# Patient Record
Sex: Female | Born: 1990 | Race: White | Hispanic: No | Marital: Single | Smoking: Current every day smoker
Health system: Southern US, Community
[De-identification: ages and names within clinical notes are randomized; demographics above are authoritative.]

## PROBLEM LIST (undated history)

## (undated) DIAGNOSIS — Z8489 Family history of other specified conditions: Secondary | ICD-10-CM

## (undated) DIAGNOSIS — K219 Gastro-esophageal reflux disease without esophagitis: Secondary | ICD-10-CM

## (undated) DIAGNOSIS — R519 Headache, unspecified: Secondary | ICD-10-CM

## (undated) DIAGNOSIS — J45909 Unspecified asthma, uncomplicated: Secondary | ICD-10-CM

## (undated) DIAGNOSIS — I499 Cardiac arrhythmia, unspecified: Secondary | ICD-10-CM

## (undated) DIAGNOSIS — Q231 Congenital insufficiency of aortic valve: Secondary | ICD-10-CM

## (undated) DIAGNOSIS — M1990 Unspecified osteoarthritis, unspecified site: Secondary | ICD-10-CM

## (undated) DIAGNOSIS — F32A Depression, unspecified: Secondary | ICD-10-CM

## (undated) DIAGNOSIS — G4700 Insomnia, unspecified: Secondary | ICD-10-CM

## (undated) DIAGNOSIS — F319 Bipolar disorder, unspecified: Secondary | ICD-10-CM

## (undated) DIAGNOSIS — D649 Anemia, unspecified: Secondary | ICD-10-CM

## (undated) DIAGNOSIS — F329 Major depressive disorder, single episode, unspecified: Secondary | ICD-10-CM

## (undated) DIAGNOSIS — R002 Palpitations: Secondary | ICD-10-CM

## (undated) DIAGNOSIS — G4730 Sleep apnea, unspecified: Secondary | ICD-10-CM

## (undated) DIAGNOSIS — M419 Scoliosis, unspecified: Secondary | ICD-10-CM

## (undated) DIAGNOSIS — Q2381 Bicuspid aortic valve: Secondary | ICD-10-CM

## (undated) DIAGNOSIS — M797 Fibromyalgia: Secondary | ICD-10-CM

## (undated) DIAGNOSIS — I10 Essential (primary) hypertension: Secondary | ICD-10-CM

## (undated) DIAGNOSIS — F419 Anxiety disorder, unspecified: Secondary | ICD-10-CM

## (undated) HISTORY — PX: BELOW KNEE LEG AMPUTATION: SUR23

## (undated) HISTORY — DX: Depression, unspecified: F32.A

## (undated) HISTORY — DX: Bicuspid aortic valve: Q23.81

## (undated) HISTORY — DX: Sleep apnea, unspecified: G47.30

## (undated) HISTORY — PX: TONSILLECTOMY: SUR1361

## (undated) HISTORY — PX: ANKLE SURGERY: SHX546

## (undated) HISTORY — DX: Anxiety disorder, unspecified: F41.9

## (undated) HISTORY — DX: Congenital insufficiency of aortic valve: Q23.1

## (undated) HISTORY — PX: FRACTURE SURGERY: SHX138

## (undated) HISTORY — DX: Major depressive disorder, single episode, unspecified: F32.9

---

## 2017-05-11 ENCOUNTER — Other Ambulatory Visit: Payer: Self-pay

## 2017-05-11 ENCOUNTER — Encounter (HOSPITAL_BASED_OUTPATIENT_CLINIC_OR_DEPARTMENT_OTHER): Payer: Self-pay | Admitting: Emergency Medicine

## 2017-05-11 ENCOUNTER — Emergency Department (HOSPITAL_BASED_OUTPATIENT_CLINIC_OR_DEPARTMENT_OTHER)
Admission: EM | Admit: 2017-05-11 | Discharge: 2017-05-11 | Disposition: A | Discharge status: Home / Self Care | Payer: No Typology Code available for payment source | Attending: Emergency Medicine | Admitting: Emergency Medicine

## 2017-05-11 ENCOUNTER — Emergency Department (HOSPITAL_BASED_OUTPATIENT_CLINIC_OR_DEPARTMENT_OTHER): Payer: No Typology Code available for payment source

## 2017-05-11 DIAGNOSIS — F17200 Nicotine dependence, unspecified, uncomplicated: Secondary | ICD-10-CM | POA: Insufficient documentation

## 2017-05-11 DIAGNOSIS — R002 Palpitations: Secondary | ICD-10-CM | POA: Diagnosis not present

## 2017-05-11 LAB — CBC
HCT: 38.4 % (ref 36.0–46.0)
Hemoglobin: 13.5 g/dL (ref 12.0–15.0)
MCH: 31.1 pg (ref 26.0–34.0)
MCHC: 35.2 g/dL (ref 30.0–36.0)
MCV: 88.5 fL (ref 78.0–100.0)
Platelets: 225 10*3/uL (ref 150–400)
RBC: 4.34 MIL/uL (ref 3.87–5.11)
RDW: 11.9 % (ref 11.5–15.5)
WBC: 9.5 10*3/uL (ref 4.0–10.5)

## 2017-05-11 LAB — BASIC METABOLIC PANEL
Anion gap: 8 (ref 5–15)
BUN: 10 mg/dL (ref 6–20)
CO2: 23 mmol/L (ref 22–32)
Calcium: 9.3 mg/dL (ref 8.9–10.3)
Chloride: 104 mmol/L (ref 101–111)
Creatinine, Ser: 0.59 mg/dL (ref 0.44–1.00)
GFR calc Af Amer: >60 mL/min (ref >60)
GFR calc non Af Amer: >60 mL/min (ref >60)
Glucose, Bld: 98 mg/dL (ref 65–99)
Potassium: 3.8 mmol/L (ref 3.5–5.1)
Sodium: 135 mmol/L (ref 135–145)

## 2017-05-11 LAB — TROPONIN I: Troponin I: <0.03 ng/mL (ref <0.03)

## 2017-05-11 NOTE — ED Notes (Signed)
SpCO spot checked.  Running 4-7%.  EDP aware, placed on 2l/m [place].
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
## 2017-05-11 NOTE — ED Provider Notes (Signed)
[facility] EMERGENCY DEPARTMENT Provider Note   CSN: [id number] Arrival date & time: 05/11/17  0160     History   Chief Complaint Chief Complaint  Patient presents with  . Palpitations    HPI Taylor [name] is a 27 y.o. female.  HPI Here for evaluation of palpitations and dizziness.  Patient was palpitations have been ongoing over the past several months, characterized as a sensation of beating in her chest that are fleeting and spontaneous.  She also reports this morning feeling suddenly lightheaded for about 10 seconds and that to spontaneously resolved.  No fevers, chills, overt chest pain or shortness of breath, leg swelling, vomiting, diarrhea, constipation, abdominal pain, urinary symptoms, rash.  Current every day cigarette smoker.  She also reports that she drinks a lot of red bull, last ingestion 2 days ago.  Also has several cups of coffee daily.  Denies history of hypertension, hyperlipidemia, diabetes, family history of heart disease. History reviewed. No pertinent past medical history.  There are no active problems to display for this patient.   Past Surgical History:  Procedure Laterality Date  . TONSILLECTOMY      OB History    No data available       Home Medications    Prior to Admission medications   Not on File    Family History No family history on file.  Social History Social History   Tobacco Use  . Smoking status: Current Every Day Smoker  . Smokeless tobacco: Never Used  Substance Use Topics  . Alcohol use: No    Frequency: Never  . Drug use: No     Allergies   Patient has no known allergies.   Review of Systems Review of Systems HPI  Physical Exam Updated Vital Signs BP (!) 142/87 (BP Location: Right Arm)   Pulse 87   Temp 98.8 F (37.1 C) (Oral)   Resp 18   Ht 5\' 4"  (1.626 m)   Wt 99.8 kg (220 lb)   SpO2 99%   BMI 37.76 kg/m   Physical Exam  Constitutional: She appears well-developed. No distress.    Awake, alert and nontoxic in appearance  HENT:  Head: Normocephalic and atraumatic.  Right Ear: External ear normal.  Left Ear: External ear normal.  Mouth/Throat: Oropharynx is clear and moist.  Eyes: Conjunctivae and EOM are normal. Pupils are equal, round, and reactive to light.  Neck: Normal range of motion. No JVD present.  Cardiovascular: Normal rate, regular rhythm and normal heart sounds.  Pulmonary/Chest: Effort normal and breath sounds normal. No stridor.  Abdominal: Soft. There is no tenderness.  Musculoskeletal: Normal range of motion.  Neurological:  Awake, alert, cooperative and aware of situation; motor strength 5/5 and equal bilaterally; sensation normal to light touch bilaterally; no facial asymmetry; tongue midline; major cranial nerves appear intact;  baseline gait without new ataxia.  Finger to nose normal.  No pronator drift.  Skin: No rash noted. She is not diaphoretic.  Psychiatric: She has a normal mood and affect. Her behavior is normal. Thought content normal.  Nursing note and vitals reviewed.    ED Treatments / Results  Labs (all labs ordered are listed, but only abnormal results are displayed) Labs Reviewed  BASIC METABOLIC PANEL  CBC  TROPONIN I  PREGNANCY, URINE    EKG  EKG Interpretation  Date/Time:  Tuesday May 11 2017 10:28:17 EST Ventricular Rate:  88 PR Interval:    QRS Duration: 93 QT Interval:  356 QTC  Calculation: 431 R Axis:   67 Text Interpretation:  Sinus rhythm Low voltage, precordial leads No significant change since last tracing Confirmed by Blanchie Dessert [phone]) on 05/11/2017 11:09:50 AM       Radiology Dg Chest 2 View  Result Date: 05/11/2017 CLINICAL DATA:  Dizzy spells and shortness of breath. EXAM: CHEST  2 VIEW COMPARISON:  None. FINDINGS: The cardiac silhouette, mediastinal and hilar contours are within normal limits. The lungs are clear. No pleural effusion. The bony thorax is intact. IMPRESSION: Normal chest  x-ray. Electronically Signed   By: Marijo Sanes M.D.   On: 05/11/2017 10:56    Procedures Procedures (including critical care time)  Medications Ordered in ED Medications - No data to display   Initial Impression / Assessment and Plan / ED Course  I have reviewed the triage vital signs and the nursing notes.  Pertinent labs & imaging results that were available during my care of the patient were reviewed by me and considered in my medical decision making (see chart for details).     Presents for evaluation of intermittent palpitations.  Exam is reassuring, as is EKG, chest x-ray, labs, troponin negative.  She overall appears very well, nontoxic, hemodynamically stable, afebrile.  Discussed cessation of smoking as this is overall detrimental to health.  Also consider cessation of red bull, energy drinks, coffee as this can exacerbate symptoms.  Follow-up with PCP for possible Holter monitoring.  Return precautions discussed   Final Clinical Impressions(s) / ED Diagnoses   Final diagnoses:  Palpitations    ED Discharge Orders    None       Comer Locket, PA-C 05/11/17 1236    Blanchie Dessert, MD 05/11/17 1558

## 2017-05-11 NOTE — ED Notes (Signed)
ED Provider at bedside. 

## 2017-05-11 NOTE — Discharge Instructions (Signed)
Your exam, EKG, labs and chest x-ray are all reassuring.  It is important for you to stop smoking as this can be detrimental to your health.  He may also want to consider stop drinking red bull, other energy drinks, coffee as this can also contribute to your symptoms.  Please follow-up with your doctor for reevaluation.  Return to ED for new or worsening symptoms

## 2017-05-20 ENCOUNTER — Other Ambulatory Visit: Payer: Self-pay

## 2017-05-20 ENCOUNTER — Emergency Department (HOSPITAL_BASED_OUTPATIENT_CLINIC_OR_DEPARTMENT_OTHER)
Admission: EM | Admit: 2017-05-20 | Discharge: 2017-05-20 | Disposition: A | Discharge status: Home / Self Care | Payer: No Typology Code available for payment source | Attending: Emergency Medicine | Admitting: Emergency Medicine

## 2017-05-20 ENCOUNTER — Encounter (HOSPITAL_BASED_OUTPATIENT_CLINIC_OR_DEPARTMENT_OTHER): Payer: Self-pay | Admitting: *Deleted

## 2017-05-20 DIAGNOSIS — K0889 Other specified disorders of teeth and supporting structures: Secondary | ICD-10-CM | POA: Insufficient documentation

## 2017-05-20 DIAGNOSIS — F17200 Nicotine dependence, unspecified, uncomplicated: Secondary | ICD-10-CM | POA: Diagnosis not present

## 2017-05-20 DIAGNOSIS — Z79899 Other long term (current) drug therapy: Secondary | ICD-10-CM | POA: Insufficient documentation

## 2017-05-20 MED ORDER — NAPROXEN 500 MG PO TABS: 500.0000 mg | ORAL_TABLET | Freq: Two times a day (BID) | ORAL | 0 refills | Status: DC

## 2017-05-20 NOTE — ED Provider Notes (Signed)
[place] [facility] EMERGENCY DEPARTMENT Provider Note   CSN: [id number] Arrival date & time: 05/20/17  1216     History   Chief Complaint Chief Complaint  Patient presents with  . Dental Problem    HPI Taylor [name] is a 27 y.o. female.  HPI   Taylor [name] is a 27 year old female with no significant past medical history who presents to the emergency department for evaluation of right lower tooth pain and swelling.  Patient states that her pain began 2 days ago and has gradually worsened.  She reports that she noticed some associated right lower jaw swelling.  Was seen at the urgent care and was prescribed Augmentin which she began taking yesterday eveniing.  She was concerned because she woke up this morning and felt as if the right facial swelling had spread to the right side of her neck.  Her pain is 8/10 in severity and "throbbing" in nature.  She has tried taking ibuprofen without significant relief.  She denies fevers, chills, nausea/vomiting, trismus, dysphagia.  Reports that she does not have a regular dentist  History reviewed. No pertinent past medical history.  There are no active problems to display for this patient.   Past Surgical History:  Procedure Laterality Date  . TONSILLECTOMY      OB History    No data available       Home Medications    Prior to Admission medications   Medication Sig Start Date End Date Taking? Authorizing Provider  amoxicillin-clavulanate (AUGMENTIN) 875-125 MG tablet Take 1 tablet by mouth 2 (two) times daily.   Yes [provider]    Family History No family history on file.  Social History Social History   Tobacco Use  . Smoking status: Current Every Day Smoker  . Smokeless tobacco: Never Used  Substance Use Topics  . Alcohol use: No    Frequency: Never  . Drug use: No     Allergies   Patient has no known allergies.   Review of Systems Review of Systems  Constitutional: Negative for chills and  fever.  HENT: Positive for dental problem and facial swelling. Negative for trouble swallowing.   Respiratory: Negative for shortness of breath.   Gastrointestinal: Negative for abdominal pain, nausea and vomiting.     Physical Exam Updated Vital Signs BP 115/76   Pulse 81   Temp 98.2 F (36.8 C) (Oral)   Resp 16   Ht 5\' 4"  (1.626 m)   Wt 99.8 kg (220 lb)   SpO2 97%   BMI 37.76 kg/m   Physical Exam  Constitutional: She is oriented to person, place, and time. She appears well-developed and well-nourished. No distress.  HENT:  Head: Normocephalic and atraumatic.  Mouth/Throat:    Dental cavities and poor oral dentition noted. Pain along tooth as depicted in image. No abscess noted. Midline uvula. No trismus. OP moist and clear. No oropharyngeal erythema or edema. She has right lower jaw swelling. Neck supple, with tenderness over the right submandibular lymph nodes. Airway patent   Eyes: Right eye exhibits no discharge. Left eye exhibits no discharge.  Neck: Normal range of motion. Neck supple.  Pulmonary/Chest: Effort normal. No respiratory distress.  Neurological: She is alert and oriented to person, place, and time. Coordination normal.  Skin: She is not diaphoretic.  Psychiatric: She has a normal mood and affect. Her behavior is normal.  Nursing note and vitals reviewed.    ED Treatments / Results  Labs (all labs ordered are  listed, but only abnormal results are displayed) Labs Reviewed - No data to display  EKG  EKG Interpretation None       Radiology No results found.  Procedures Procedures (including critical care time)  Medications Ordered in ED Medications - No data to display   Initial Impression / Assessment and Plan / ED Course  I have reviewed the triage vital signs and the nursing notes.  Pertinent labs & imaging results that were available during my care of the patient were reviewed by me and considered in my medical decision making (see  chart for details).    Patient with toothache and right lower jaw swelling. She does have some tenderness over right sided submandibular lymph nodes.  No gross abscess.  Exam unconcerning for Ludwig's angina.  Have counseled her to continue taking her prescribed Augmentin. Discussed NSAID use for pain. Urged patient to follow-up with dentist.  Discussed return precautions and patient agrees Discussed this patient with Dr. Leonette Monarch who also saw the patient and agrees with above plan.   Final Clinical Impressions(s) / ED Diagnoses   Final diagnoses:  Pain, dental    ED Discharge Orders        Ordered    naproxen (NAPROSYN) 500 MG tablet  2 times daily     05/20/17 1458       Glyn Ade, PA-C 05/20/17 1705    Fatima Blank, MD 05/20/17 [phone]

## 2017-05-20 NOTE — Discharge Instructions (Signed)
Please continue taking antibiotic as prescribed.   You can take naprosyn twice a day for pain. This is a strong antiinflammatory medicine, please do not take with ibuprofen.   It is very important you follow up with a dentist.   Return to the ER if you have worsening swelling in the neck that crosses the midline, if you get fever >100.21F, trouble breathing or you have any new or worsening symptoms.

## 2017-05-20 NOTE — ED Triage Notes (Signed)
Dental abscess. She was seen at [facility] yesterday and started on Augmentin. She woke with increased facial swelling and redness.

## 2018-05-11 NOTE — L&D Delivery Note (Signed)
Delivery Note At 5:03 PM a viable female was delivered via Vaginal, Spontaneous (Presentation: LOA).  APGAR: 8, 9; weight  pending.   Placenta status: spontaneous, intact.  Cord: 3 vessel   Anesthesia:  epidural Episiotomy: None Lacerations: 2nd degree Suture Repair: 3.0 vicryl Est. Blood Loss (mL): 300  Mom to postpartum.  Baby to Couplet care / Skin to Skin.  Wende Mott CNM 03/25/2019, 6:08 PM

## 2018-05-26 DIAGNOSIS — Z0389 Encounter for observation for other suspected diseases and conditions ruled out: Secondary | ICD-10-CM | POA: Diagnosis not present

## 2018-05-26 DIAGNOSIS — Z01419 Encounter for gynecological examination (general) (routine) without abnormal findings: Secondary | ICD-10-CM | POA: Diagnosis not present

## 2018-05-26 DIAGNOSIS — Z3009 Encounter for other general counseling and advice on contraception: Secondary | ICD-10-CM | POA: Diagnosis not present

## 2018-05-26 DIAGNOSIS — Z1388 Encounter for screening for disorder due to exposure to contaminants: Secondary | ICD-10-CM | POA: Diagnosis not present

## 2018-06-02 ENCOUNTER — Ambulatory Visit: Payer: Self-pay | Admitting: Family

## 2018-06-06 ENCOUNTER — Ambulatory Visit (INDEPENDENT_AMBULATORY_CARE_PROVIDER_SITE_OTHER): Payer: PRIVATE HEALTH INSURANCE | Admitting: Family

## 2018-06-06 ENCOUNTER — Encounter: Payer: Self-pay | Admitting: Family

## 2018-06-06 VITALS — BP 128/86 | HR 96 | Temp 97.3°F | Ht 64.0 in | Wt 256.0 lb

## 2018-06-06 DIAGNOSIS — F4310 Post-traumatic stress disorder, unspecified: Secondary | ICD-10-CM | POA: Diagnosis not present

## 2018-06-06 DIAGNOSIS — F17200 Nicotine dependence, unspecified, uncomplicated: Secondary | ICD-10-CM | POA: Diagnosis not present

## 2018-06-06 DIAGNOSIS — K047 Periapical abscess without sinus: Secondary | ICD-10-CM | POA: Insufficient documentation

## 2018-06-06 DIAGNOSIS — F321 Major depressive disorder, single episode, moderate: Secondary | ICD-10-CM | POA: Insufficient documentation

## 2018-06-06 DIAGNOSIS — Z720 Tobacco use: Secondary | ICD-10-CM | POA: Insufficient documentation

## 2018-06-06 DIAGNOSIS — K219 Gastro-esophageal reflux disease without esophagitis: Secondary | ICD-10-CM | POA: Diagnosis not present

## 2018-06-06 DIAGNOSIS — S02609S Fracture of mandible, unspecified, sequela: Secondary | ICD-10-CM

## 2018-06-06 MED ORDER — OMEPRAZOLE 20 MG PO CPDR: 20.0000 mg | DELAYED_RELEASE_CAPSULE | Freq: Every day | ORAL | 3 refills | Status: DC

## 2018-06-06 MED ORDER — CITALOPRAM HYDROBROMIDE 20 MG PO TABS: ORAL_TABLET | ORAL | 5 refills | Status: DC

## 2018-06-06 MED ORDER — CLINDAMYCIN HCL 300 MG PO CAPS: 300.0000 mg | ORAL_CAPSULE | Freq: Three times a day (TID) | ORAL | 0 refills | Status: DC
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
## 2018-06-06 NOTE — Patient Instructions (Signed)

## 2018-06-07 LAB — CMP14+EGFR
ALT: 32 IU/L (ref 0–32)
AST: 18 IU/L (ref 0–40)
Albumin/Globulin Ratio: 2.1 (ref 1.2–2.2)
Albumin: 5.1 g/dL — ABNORMAL HIGH (ref 3.9–5.0)
Alkaline Phosphatase: 83 IU/L (ref 39–117)
BUN/Creatinine Ratio: 19 (ref 9–23)
BUN: 11 mg/dL (ref 6–20)
Bilirubin Total: 0.2 mg/dL (ref 0.0–1.2)
CO2: 20 mmol/L (ref 20–29)
Calcium: 9.9 mg/dL (ref 8.7–10.2)
Chloride: 100 mmol/L (ref 96–106)
Creatinine, Ser: 0.59 mg/dL (ref 0.57–1.00)
GFR calc Af Amer: 143 mL/min/{1.73_m2} (ref >59)
GFR calc non Af Amer: 124 mL/min/{1.73_m2} (ref >59)
Globulin, Total: 2.4 g/dL (ref 1.5–4.5)
Glucose: 86 mg/dL (ref 65–99)
Potassium: 4.1 mmol/L (ref 3.5–5.2)
Sodium: 138 mmol/L (ref 134–144)
Total Protein: 7.5 g/dL (ref 6.0–8.5)

## 2018-06-07 LAB — CBC WITH DIFFERENTIAL/PLATELET
Basophils Absolute: 0 10*3/uL (ref 0.0–0.2)
Basos: 0 %
EOS (ABSOLUTE): 0.2 10*3/uL (ref 0.0–0.4)
Eos: 2 %
Hematocrit: 40.4 % (ref 34.0–46.6)
Hemoglobin: 13.7 g/dL (ref 11.1–15.9)
Immature Grans (Abs): 0 10*3/uL (ref 0.0–0.1)
Immature Granulocytes: 0 %
Lymphocytes Absolute: 2.4 10*3/uL (ref 0.7–3.1)
Lymphs: 23 %
MCH: 30.4 pg (ref 26.6–33.0)
MCHC: 33.9 g/dL (ref 31.5–35.7)
MCV: 90 fL (ref 79–97)
Monocytes Absolute: 0.5 10*3/uL (ref 0.1–0.9)
Monocytes: 5 %
Neutrophils Absolute: 7.5 10*3/uL — ABNORMAL HIGH (ref 1.4–7.0)
Neutrophils: 70 %
Platelets: 242 10*3/uL (ref 150–450)
RBC: 4.5 x10E6/uL (ref 3.77–5.28)
RDW: 12.1 % (ref 11.7–15.4)
WBC: 10.6 10*3/uL (ref 3.4–10.8)

## 2018-06-07 LAB — TSH: TSH: 1.1 u[IU]/mL (ref 0.450–4.500)

## 2018-06-09 ENCOUNTER — Telehealth: Payer: Self-pay

## 2018-06-09 NOTE — Telephone Encounter (Signed)
lmtcb

## 2018-06-30 ENCOUNTER — Ambulatory Visit (INDEPENDENT_AMBULATORY_CARE_PROVIDER_SITE_OTHER): Payer: PRIVATE HEALTH INSURANCE | Admitting: Family

## 2018-06-30 ENCOUNTER — Encounter: Payer: Self-pay | Admitting: Family

## 2018-06-30 VITALS — BP 114/79 | HR 79 | Temp 98.4°F | Ht 64.0 in | Wt 254.6 lb

## 2018-06-30 DIAGNOSIS — F411 Generalized anxiety disorder: Secondary | ICD-10-CM

## 2018-06-30 DIAGNOSIS — J029 Acute pharyngitis, unspecified: Secondary | ICD-10-CM | POA: Diagnosis not present

## 2018-06-30 DIAGNOSIS — F17200 Nicotine dependence, unspecified, uncomplicated: Secondary | ICD-10-CM | POA: Diagnosis not present

## 2018-06-30 DIAGNOSIS — F410 Panic disorder [episodic paroxysmal anxiety] without agoraphobia: Secondary | ICD-10-CM | POA: Insufficient documentation

## 2018-06-30 DIAGNOSIS — J020 Streptococcal pharyngitis: Secondary | ICD-10-CM

## 2018-06-30 DIAGNOSIS — F321 Major depressive disorder, single episode, moderate: Secondary | ICD-10-CM | POA: Diagnosis not present

## 2018-06-30 LAB — RAPID STREP SCREEN (MED CTR MEBANE ONLY): Strep Gp A Ag, IA W/Reflex: POSITIVE — AB

## 2018-06-30 MED ORDER — CITALOPRAM HYDROBROMIDE 40 MG PO TABS: 40.0000 mg | ORAL_TABLET | Freq: Every day | ORAL | 5 refills | Status: DC

## 2018-06-30 MED ORDER — AMOXICILLIN 875 MG PO TABS: 875.0000 mg | ORAL_TABLET | Freq: Two times a day (BID) | ORAL | 0 refills | Status: DC

## 2018-06-30 NOTE — Patient Instructions (Signed)
Strep Throat    Strep throat is a bacterial infection of the throat. Your health care provider may call the infection tonsillitis or pharyngitis, depending on whether there is swelling in the tonsils or at the back of the throat. Strep throat is most common during the cold months of the year in children who are 5-30 years of age, but it can happen during any season in people of any age. This infection is spread from person to person (contagious) through coughing, sneezing, or close contact.  What are the causes?  Strep throat is caused by the bacteria called Streptococcus pyogenes.  What increases the risk?  This condition is more likely to develop in:  · People who spend time in crowded places where the infection can spread easily.  · People who have close contact with someone who has strep throat.  What are the signs or symptoms?  Symptoms of this condition include:  · Fever or chills.  · Redness, swelling, or pain in the tonsils or throat.  · Pain or difficulty when swallowing.  · White or yellow spots on the tonsils or throat.  · Swollen, tender glands in the neck or under the jaw.  · Red rash all over the body (rare).  How is this diagnosed?  This condition is diagnosed by performing a rapid strep test or by taking a swab of your throat (throat culture test). Results from a rapid strep test are usually ready in a few minutes, but throat culture test results are available after one or two days.  How is this treated?  This condition is treated with antibiotic medicine.  Follow these instructions at home:  Medicines  · Take over-the-counter and prescription medicines only as told by your health care provider.  · Take your antibiotic as told by your health care provider. Do not stop taking the antibiotic even if you start to feel better.  · Have family members who also have a sore throat or fever tested for strep throat. They may need antibiotics if they have the strep infection.  Eating and drinking  · Do not  share food, drinking cups, or personal items that could cause the infection to spread to other people.  · If swallowing is difficult, try eating soft foods until your sore throat feels better.  · Drink enough fluid to keep your urine clear or pale yellow.  General instructions  · Gargle with a salt-water mixture 3-4 times per day or as needed. To make a salt-water mixture, completely dissolve ½-1 tsp of salt in 1 cup of warm water.  · Make sure that all household members wash their hands well.  · Get plenty of rest.  · Stay home from school or work until you have been taking antibiotics for 24 hours.  · Keep all follow-up visits as told by your health care provider. This is important.  Contact a health care provider if:  · The glands in your neck continue to get bigger.  · You develop a rash, cough, or earache.  · You cough up a thick liquid that is green, yellow-brown, or bloody.  · You have pain or discomfort that does not get better with medicine.  · Your problems seem to be getting worse rather than better.  · You have a fever.  Get help right away if:  · You have new symptoms, such as vomiting, severe headache, stiff or painful neck, chest pain, or shortness of breath.  · You have severe throat   pain, drooling, or changes in your voice.  · You have swelling of the neck, or the skin on the neck becomes red and tender.  · You have signs of dehydration, such as fatigue, dry mouth, and decreased urination.  · You become increasingly sleepy, or you cannot wake up completely.  · Your joints become red or painful.  This information is not intended to replace advice given to you by your health care provider. Make sure you discuss any questions you have with your health care provider.  Document Released: 04/24/2000 Document Revised: 12/25/2015 Document Reviewed: 08/20/2014  Elsevier Interactive Patient Education © 2019 [employer]

## 2018-06-30 NOTE — Progress Notes (Signed)
Subjective:    Patient ID: Taylor [name], female    DOB: [birth year], 28 y.o.   MRN: [id number]  Chief Complaint  Patient presents with  . Sore Throat  . Headache  . Generalized Body Aches   PT presents to the office today with sore throat and recheck GAD and Depression. She states she was started on Celexa 20 mg and after 2 weeks she increased to 40 mg. She states her depression has greatly improved. Sore Throat   This is a new problem. The current episode started yesterday. The problem has been gradually worsening. There has been no fever. The pain is at a severity of 8/10. Associated symptoms include congestion, coughing, headaches, a plugged ear sensation, swollen glands and trouble swallowing. Pertinent negatives include no ear discharge. She has tried acetaminophen for the symptoms. The treatment provided mild relief.  Headache   Associated symptoms include coughing and swollen glands.  Depression         This is a chronic problem.  The current episode started more than 1 year ago.   The onset quality is gradual.   The problem occurs intermittently.  The problem has been waxing and waning since onset.  Associated symptoms include irritable, decreased interest, headaches and sad.  Associated symptoms include no helplessness and no hopelessness.  Past treatments include SSRIs - Selective serotonin reuptake inhibitors.  Compliance with treatment is good.  Past medical history includes anxiety.   Anxiety  Presents for follow-up visit. Symptoms include depressed mood, excessive worry, malaise and nervous/anxious behavior. Symptoms occur occasionally. The severity of symptoms is moderate.        Review of Systems  HENT: Positive for congestion and trouble swallowing. Negative for ear discharge.   Respiratory: Positive for cough.   Neurological: Positive for headaches.  Psychiatric/Behavioral: Positive for depression. The patient is nervous/anxious.   All other systems reviewed and  are negative.      Objective:   Physical Exam Vitals signs reviewed.  Constitutional:      General: She is irritable. She is not in acute distress.    Appearance: She is well-developed. She is obese.  HENT:     Head: Normocephalic and atraumatic.     Right Ear: External ear normal.     Nose: No rhinorrhea.     Mouth/Throat:     Pharynx: Posterior oropharyngeal erythema present.  Eyes:     Pupils: Pupils are equal, round, and reactive to light.  Neck:     Musculoskeletal: Normal range of motion and neck supple.     Thyroid: No thyromegaly.  Cardiovascular:     Rate and Rhythm: Normal rate and regular rhythm.     Heart sounds: Normal heart sounds. No murmur.  Pulmonary:     Effort: Pulmonary effort is normal. No respiratory distress.     Breath sounds: Normal breath sounds. No wheezing.  Abdominal:     General: Bowel sounds are normal. There is no distension.     Palpations: Abdomen is soft.     Tenderness: There is no abdominal tenderness.  Musculoskeletal: Normal range of motion.        General: No tenderness.  Skin:    General: Skin is warm and dry.  Neurological:     Mental Status: She is alert and oriented to person, place, and time.     Cranial Nerves: No cranial nerve deficit.     Deep Tendon Reflexes: Reflexes are normal and symmetric.  Psychiatric:  Behavior: Behavior normal.        Thought Content: Thought content normal.        Judgment: Judgment normal.       BP 114/79   Pulse 79   Temp 98.4 F (36.9 C) (Oral)   Ht 5\' 4"  (1.626 m)   Wt 254 lb 9.6 oz (115.5 kg)   BMI 43.70 kg/m      Assessment & Plan:  Taylor [name] comes in today with chief complaint of Sore Throat; Headache; and Generalized Body Aches   Diagnosis and orders addressed:  1. Sore throat - Rapid Strep Screen ([facility] ONLY)  2. Depression, major, single episode, moderate (HCC) Continue Celexa 40 mg  Stress management  Keep Behavioral Health appt  -  citalopram (CELEXA) 40 MG tablet; Take 1 tablet (40 mg total) by mouth daily.  Dispense: 30 tablet; Refill: 5  3. Current smoker Smoking cessation discussed  4. GAD (generalized anxiety disorder) Stress management  Continue Celexa 40 mg - citalopram (CELEXA) 40 MG tablet; Take 1 tablet (40 mg total) by mouth daily.  Dispense: 30 tablet; Refill: 5  5. Strep throat - Take meds as prescribed - Use a cool mist humidifier  -Use saline nose sprays frequently -Force fluids -For any cough or congestion  Use plain Mucinex- regular strength or max strength is fine -For fever or aces or pains- take tylenol or ibuprofen. -Throat lozenges if help -New toothbrush in 3 days - amoxicillin (AMOXIL) 875 MG tablet; Take 1 tablet (875 mg total) by mouth 2 (two) times daily.  Dispense: 14 tablet; Refill: 0   Evelina Dun, FNP

## 2018-07-01 ENCOUNTER — Telehealth: Payer: Self-pay | Admitting: Family

## 2018-07-01 NOTE — Telephone Encounter (Signed)
Work note ready for pick up.  Patient aware

## 2018-07-01 NOTE — Telephone Encounter (Signed)
This is fine. We discussed if she needed we could extend note.

## 2018-07-11 ENCOUNTER — Telehealth: Payer: Self-pay | Admitting: Family

## 2018-07-11 NOTE — Telephone Encounter (Signed)
LM to call for appt

## 2018-07-12 ENCOUNTER — Ambulatory Visit (INDEPENDENT_AMBULATORY_CARE_PROVIDER_SITE_OTHER): Payer: PRIVATE HEALTH INSURANCE | Admitting: Family Medicine

## 2018-07-12 ENCOUNTER — Encounter: Payer: Self-pay | Admitting: Family Medicine

## 2018-07-12 VITALS — BP 130/75 | HR 97 | Temp 97.1°F | Ht 64.0 in | Wt 257.1 lb

## 2018-07-12 DIAGNOSIS — Z3201 Encounter for pregnancy test, result positive: Secondary | ICD-10-CM | POA: Diagnosis not present

## 2018-07-12 NOTE — Progress Notes (Signed)
Chief Complaint  Patient presents with  . Possible Pregnancy    pt here today for pregnancy test after having positive at home preg test to verify due to working with chemicals    HPI  Patient presents today for possible pregnancy.  She had her IUD removed about 6 weeks ago.  Her pregnancy test was positive at home.  She is concerned about the possibility of the effects on the baby from her being exposed to chemicals at work.  PMH: Smoking status noted ROS: Per HPI  Objective: BP 130/75   Pulse 97   Temp (!) 97.1 F (36.2 C) (Oral)   Ht 5\' 4"  (1.626 m)   Wt 257 lb 2 oz (116.6 kg)   BMI 44.14 kg/m  Gen: NAD, alert, cooperative with exam HEENT: NCAT, EOMI, PERRL Pregnancy confirmed neuro: Alert and oriented, No gross deficits  Assessment and plan:  1. Pregnancy test performed, pregnancy confirmed     [facility].  I encouraged her to keep that appointment.  She was given the lab sheet showing positive pregnancy.  I encouraged her to discuss exposures to chemicals and medications with her obstetrician.  Until that time she should avoid exposures and not take her Celexa.  Orders Placed This Encounter  Procedures  . Pregnancy, urine    Follow up as needed.  Claretta Fraise, MD

## 2018-07-13 ENCOUNTER — Telehealth: Payer: Self-pay | Admitting: Obstetrics & Gynecology

## 2018-07-13 LAB — PREGNANCY, URINE: Preg Test, Ur: POSITIVE — AB

## 2018-07-13 NOTE — Telephone Encounter (Signed)
I have scheduled her for her dating [place].  She wants to know if it's safe to continue taking Celexa and Prilosec.  She also wants to know if she can get an OTC prenatal vitamin.  [facility] [place]  [phone]

## 2018-07-13 NOTE — Progress Notes (Deleted)
Psychiatric Initial Adult Assessment   Patient Identification: Taylor [name] MRN:  [id number] Date of Evaluation:  07/13/2018 Referral Source: Sharion Balloon, FNP Chief Complaint:   Visit Diagnosis: No diagnosis found.  History of Present Illness:   Taylor [name] is a 28 y.o. year old female with a history of depression, PTSD, r/o bipolar disorder, possible pregnancy, who is referred for depression.      Associated Signs/Symptoms: Depression Symptoms:  {DEPRESSION SYMPTOMS:20000} (Hypo) Manic Symptoms:  {BHH MANIC SYMPTOMS:22872} Anxiety Symptoms:  {BHH ANXIETY SYMPTOMS:22873} Psychotic Symptoms:  {BHH PSYCHOTIC SYMPTOMS:22874} PTSD Symptoms: {BHH PTSD SYMPTOMS:22875}  Past Psychiatric History:  Outpatient:  Psychiatry admission:  Previous suicide attempt:  Past trials of medication:  History of violence:   Previous Psychotropic Medications: {YES/NO:21197}  Substance Abuse History in the last 12 months:  {yes no:314532}  Consequences of Substance Abuse: {BHH CONSEQUENCES OF SUBSTANCE ABUSE:22880}  Past Medical History: No past medical history on file.  Past Surgical History:  Procedure Laterality Date  . ANKLE SURGERY     At age 50.  . TONSILLECTOMY      Family Psychiatric History: ***  Family History:  Family History  Problem Relation Age of Onset  . Cancer Mother        Mouth  . Hypertension Mother   . COPD Mother   . Hypertension Sister   . Diabetes Maternal Grandmother   . Diabetes Paternal Grandmother     Social History:   Social History   Socioeconomic History  . Marital status: Single    Spouse name: Not on file  . Number of children: Not on file  . Years of education: Not on file  . Highest education level: Not on file  Occupational History  . Not on file  Social Needs  . Financial resource strain: Not on file  . Food insecurity:    Worry: Not on file    Inability: Not on file  . Transportation needs:    Medical: Not on file     Non-medical: Not on file  Tobacco Use  . Smoking status: Light Tobacco Smoker  . Smokeless tobacco: Never Used  . Tobacco comment: One pack for a week and half.  Substance and Sexual Activity  . Alcohol use: No    Frequency: Never  . Drug use: No  . Sexual activity: Not on file  Lifestyle  . Physical activity:    Days per week: Not on file    Minutes per session: Not on file  . Stress: Not on file  Relationships  . Social connections:    Talks on phone: Not on file    Gets together: Not on file    Attends religious service: Not on file    Active member of club or organization: Not on file    Attends meetings of clubs or organizations: Not on file    Relationship status: Not on file  Other Topics Concern  . Not on file  Social History Narrative  . Not on file    Additional Social History: ***  Allergies:  No Known Allergies  Metabolic Disorder Labs: No results found for: HGBA1C, MPG No results found for: PROLACTIN No results found for: CHOL, TRIG, HDL, CHOLHDL, VLDL, LDLCALC Lab Results  Component Value Date   TSH 1.100 06/06/2018    Therapeutic Level Labs: No results found for: LITHIUM No results found for: CBMZ No results found for: VALPROATE  Current Medications: Current Outpatient Medications  Medication Sig Dispense Refill  . citalopram (CELEXA)  40 MG tablet Take 1 tablet (40 mg total) by mouth daily. (Patient not taking: Reported on 07/12/2018) 30 tablet 5  . omeprazole (PRILOSEC) 20 MG capsule Take 1 capsule (20 mg total) by mouth daily. 90 capsule 3   No current facility-administered medications for this visit.     Musculoskeletal: Strength & Muscle Tone: within normal limits Gait & Station: normal Patient leans: N/A  Psychiatric Specialty Exam: ROS  There were no vitals taken for this visit.There is no height or weight on file to calculate BMI.  General Appearance: Fairly Groomed  Eye Contact:  Good  Speech:  Clear and Coherent  Volume:   Normal  Mood:  {BHH MOOD:22306}  Affect:  {Affect (PAA):22687}  Thought Process:  Coherent  Orientation:  Full (Time, Place, and Person)  Thought Content:  Logical  Suicidal Thoughts:  {ST/HT (PAA):22692}  Homicidal Thoughts:  {ST/HT (PAA):22692}  Memory:  Immediate;   Good  Judgement:  {Judgement (PAA):22694}  Insight:  {Insight (PAA):22695}  Psychomotor Activity:  Normal  Concentration:  Concentration: Good and Attention Span: Good  Recall:  Good  Fund of Knowledge:Good  Language: Good  Akathisia:  No  Handed:  Right  AIMS (if indicated):  not done  Assets:  Communication Skills Desire for Improvement  ADL's:  Intact  Cognition: WNL  Sleep:  {BHH GOOD/FAIR/POOR:22877}   Screenings: PHQ2-9     Office Visit from 07/12/2018 in [place] Visit from 06/30/2018 in [place] Visit from 06/06/2018 in [place]  PHQ-2 Total Score  0  0  0      Assessment and Plan:  Assessment  Plan  The patient demonstrates the following risk factors for suicide: Chronic risk factors for suicide include: {Chronic Risk Factors for HALPFXT:02409735}. Acute risk factors for suicide include: {Acute Risk Factors for HGDJMEQ:68341962}. Protective factors for this patient include: {Protective Factors for Suicide IWLN:98921194}. Considering these factors, the overall suicide risk at this point appears to be {Desc; low/moderate/high:110033}. Patient {ACTION; IS/IS RDE:08144818} appropriate for outpatient follow up.   Norman Clay, MD 3/4/20204:04 PM

## 2018-07-13 NOTE — Telephone Encounter (Signed)
Patient called, she is scheduled for new GYN on 07/22/18 w/Jennifer w/a pregnancy test.  She got confirmation yesterday, is bringing it by today.  Please advise what I need to schedule.  [phone]

## 2018-07-13 NOTE — Telephone Encounter (Signed)
Patient informed she can take Celexa and Prilosec during pregnancy.  Advised to get PNV over the counter.  Verbalized understanding.

## 2018-07-19 ENCOUNTER — Ambulatory Visit: Payer: PRIVATE HEALTH INSURANCE | Admitting: Family

## 2018-07-20 ENCOUNTER — Ambulatory Visit (HOSPITAL_COMMUNITY): Payer: Self-pay | Admitting: Psychiatry

## 2018-07-22 ENCOUNTER — Encounter: Payer: Self-pay | Admitting: Adult Health

## 2018-07-26 ENCOUNTER — Other Ambulatory Visit: Payer: Self-pay | Admitting: Obstetrics & Gynecology

## 2018-07-26 DIAGNOSIS — O3680X0 Pregnancy with inconclusive fetal viability, not applicable or unspecified: Secondary | ICD-10-CM

## 2018-07-27 ENCOUNTER — Other Ambulatory Visit: Payer: Self-pay

## 2018-07-27 ENCOUNTER — Ambulatory Visit (INDEPENDENT_AMBULATORY_CARE_PROVIDER_SITE_OTHER): Payer: PRIVATE HEALTH INSURANCE

## 2018-07-27 DIAGNOSIS — Z3A01 Less than 8 weeks gestation of pregnancy: Secondary | ICD-10-CM | POA: Diagnosis not present

## 2018-07-27 DIAGNOSIS — O3680X0 Pregnancy with inconclusive fetal viability, not applicable or unspecified: Secondary | ICD-10-CM | POA: Diagnosis not present

## 2018-07-27 NOTE — Progress Notes (Signed)
[place] 6+3 wks single IUP w/ys,positive fht 118 bpm,normal ovaries bilat,crl 6.01 mm

## 2018-07-29 ENCOUNTER — Encounter: Payer: Self-pay | Admitting: Family

## 2018-07-29 ENCOUNTER — Other Ambulatory Visit: Payer: Self-pay

## 2018-07-29 ENCOUNTER — Ambulatory Visit (INDEPENDENT_AMBULATORY_CARE_PROVIDER_SITE_OTHER): Payer: PRIVATE HEALTH INSURANCE | Admitting: Family

## 2018-07-29 VITALS — BP 102/64 | HR 79 | Temp 97.1°F | Ht 64.0 in | Wt 260.0 lb

## 2018-07-29 DIAGNOSIS — G4733 Obstructive sleep apnea (adult) (pediatric): Secondary | ICD-10-CM

## 2018-07-29 DIAGNOSIS — Z3A01 Less than 8 weeks gestation of pregnancy: Secondary | ICD-10-CM | POA: Diagnosis not present

## 2018-07-29 DIAGNOSIS — F17200 Nicotine dependence, unspecified, uncomplicated: Secondary | ICD-10-CM | POA: Diagnosis not present

## 2018-07-29 DIAGNOSIS — F411 Generalized anxiety disorder: Secondary | ICD-10-CM

## 2018-07-29 DIAGNOSIS — F321 Major depressive disorder, single episode, moderate: Secondary | ICD-10-CM

## 2018-07-29 NOTE — Patient Instructions (Signed)

## 2018-07-29 NOTE — Progress Notes (Signed)
Subjective:    Taylor Taylor [name] ID: Taylor Taylor [name], female    DOB: [birth year], 28 y.o.   MRN: [id number]  Chief Complaint  Taylor Taylor [name] presents with  . depression/anxiety   Taylor Taylor [name] presents to the office today to recheck GAD and Depression. Taylor Taylor [name] was started on Celexa. Taylor Taylor [name] has since found out that Taylor Taylor [name] is pregnant. Taylor Taylor [name] is currently 6 weeks and 5 days.   Taylor Taylor [name] states Taylor Taylor [name] sleep apnea is worse. Taylor Taylor [name] states Taylor Taylor [name] was diagnosed with this several years ago, but no longer has a machine.  Depression         This is a chronic problem.  The onset quality is gradual.   The problem occurs intermittently.  The problem has been waxing and waning since onset.  Associated symptoms include Taylor [name], restlessness and decreased interest.  Associated symptoms include no hopelessness.  Past treatments include SSRIs - Selective serotonin reuptake inhibitors.  Past medical history includes anxiety.   Anxiety  Presents for follow-up visit. Symptoms include excessive worry, nervous/anxious behavior and restlessness. Taylor Taylor [name] reports no irritability. Symptoms occur most days. The severity of symptoms is mild.        Review of Systems  Constitutional: Negative for irritability.  Psychiatric/Behavioral: Positive for depression. The Taylor Taylor [name] is nervous/anxious.   All other systems reviewed and are negative.      Objective:   Physical Exam Vitals signs reviewed.  Constitutional:      General: Taylor Taylor [name]. Taylor Taylor [name] is not in acute distress.    Appearance: Taylor Taylor [name] is well-developed.  HENT:     Head: Normocephalic and atraumatic.     Right Ear: Tympanic membrane normal.     Left Ear: Tympanic membrane normal.  Eyes:     Pupils: Pupils are equal, round, and reactive to light.  Neck:     Musculoskeletal: Normal range of motion and neck supple.     Thyroid: No thyromegaly.  Cardiovascular:     Rate and Rhythm: Normal rate and regular rhythm.     Heart sounds: Normal heart sounds. No murmur.  Pulmonary:     Effort: Pulmonary effort is  normal. No respiratory distress.     Breath sounds: Normal breath sounds. No wheezing.  Abdominal:     General: Bowel sounds are normal. There is no distension.     Palpations: Abdomen is soft.     Tenderness: There is no abdominal tenderness.  Musculoskeletal: Normal range of motion.        General: No tenderness.  Skin:    General: Skin is warm and dry.  Neurological:     Mental Status: Taylor Taylor [name] is alert and oriented to person, place, and time.     Cranial Nerves: No cranial nerve deficit.     Deep Tendon Reflexes: Reflexes are normal and symmetric.  Psychiatric:        Behavior: Behavior normal.        Thought Content: Thought content normal.        Judgment: Judgment normal.       BP 102/64   Pulse 79   Temp (!) 97.1 F (36.2 C) (Oral)   Ht 5\' 4"  (1.626 m)   Wt 260 lb (117.9 kg)   LMP  (LMP Unknown)   BMI 44.63 kg/m      Assessment & Plan:  Taylor Taylor [name] comes in today with chief complaint of depression/anxiety   Diagnosis and orders addressed:  1. GAD (generalized anxiety disorder) Improved Continue Celexa Safe during pregnancy  Stress management   2. Depression, major, single  episode, moderate (HCC) Improved Continue Celexa Safe during pregnancy  Stress management   3. Current smoker Smoking cessation discussed  4. Less than [redacted] weeks gestation of pregnancy Keep OB appt  5. OSA (obstructive sleep apnea) Weight loss encouraged  - Ambulatory referral to Pulmonology   Follow up plan: 6 months    Taylor Dun, FNP

## 2018-08-08 ENCOUNTER — Telehealth: Payer: Self-pay | Admitting: *Deleted

## 2018-08-08 NOTE — Telephone Encounter (Signed)
Patient called stating she believes she has a cyst, patient is concern of a infection. Please advise [phone]

## 2018-08-08 NOTE — Telephone Encounter (Signed)
Pt reports that she is [redacted] weeks pregnant and has her new ob visit this week. She has a broken tooth that has a bump over it. She is worried she has an abscess. She recently got medicaid and doesn't have a dentist at this time. She wanted to see if we can send in antibiotics until she can find a dentist.

## 2018-08-09 NOTE — Telephone Encounter (Signed)
No fever, it does hurt.

## 2018-08-10 NOTE — Telephone Encounter (Signed)
Called patient back no answer 

## 2018-08-11 ENCOUNTER — Ambulatory Visit: Payer: Self-pay | Admitting: *Deleted

## 2018-08-11 ENCOUNTER — Encounter: Payer: Self-pay | Admitting: Women's Health

## 2018-09-01 ENCOUNTER — Other Ambulatory Visit: Payer: Self-pay | Admitting: Family

## 2018-09-01 DIAGNOSIS — J020 Streptococcal pharyngitis: Secondary | ICD-10-CM

## 2018-09-09 ENCOUNTER — Other Ambulatory Visit: Payer: Self-pay | Admitting: Obstetrics & Gynecology

## 2018-09-09 DIAGNOSIS — Z3682 Encounter for antenatal screening for nuchal translucency: Secondary | ICD-10-CM

## 2018-09-12 ENCOUNTER — Ambulatory Visit: Payer: Medicaid Other | Admitting: *Deleted

## 2018-09-12 ENCOUNTER — Ambulatory Visit (INDEPENDENT_AMBULATORY_CARE_PROVIDER_SITE_OTHER): Payer: Medicaid Other | Admitting: Advanced Practice Midwife

## 2018-09-12 ENCOUNTER — Encounter: Payer: Self-pay | Admitting: Advanced Practice Midwife

## 2018-09-12 ENCOUNTER — Other Ambulatory Visit: Payer: Self-pay

## 2018-09-12 ENCOUNTER — Ambulatory Visit (INDEPENDENT_AMBULATORY_CARE_PROVIDER_SITE_OTHER): Payer: Medicaid Other

## 2018-09-12 VITALS — BP 109/71 | HR 90

## 2018-09-12 DIAGNOSIS — Z3402 Encounter for supervision of normal first pregnancy, second trimester: Secondary | ICD-10-CM

## 2018-09-12 DIAGNOSIS — Z1379 Encounter for other screening for genetic and chromosomal anomalies: Secondary | ICD-10-CM

## 2018-09-12 DIAGNOSIS — Z3481 Encounter for supervision of other normal pregnancy, first trimester: Secondary | ICD-10-CM

## 2018-09-12 DIAGNOSIS — G4730 Sleep apnea, unspecified: Secondary | ICD-10-CM | POA: Insufficient documentation

## 2018-09-12 DIAGNOSIS — Z331 Pregnant state, incidental: Secondary | ICD-10-CM

## 2018-09-12 DIAGNOSIS — Z3A13 13 weeks gestation of pregnancy: Secondary | ICD-10-CM

## 2018-09-12 DIAGNOSIS — Z1371 Encounter for nonprocreative screening for genetic disease carrier status: Secondary | ICD-10-CM

## 2018-09-12 DIAGNOSIS — Z363 Encounter for antenatal screening for malformations: Secondary | ICD-10-CM

## 2018-09-12 DIAGNOSIS — Z3682 Encounter for antenatal screening for nuchal translucency: Secondary | ICD-10-CM | POA: Diagnosis not present

## 2018-09-12 DIAGNOSIS — Q231 Congenital insufficiency of aortic valve: Secondary | ICD-10-CM | POA: Insufficient documentation

## 2018-09-12 DIAGNOSIS — Z1389 Encounter for screening for other disorder: Secondary | ICD-10-CM

## 2018-09-12 DIAGNOSIS — Z3490 Encounter for supervision of normal pregnancy, unspecified, unspecified trimester: Secondary | ICD-10-CM | POA: Insufficient documentation

## 2018-09-12 LAB — POCT URINALYSIS DIPSTICK OB
Blood, UA: NEGATIVE
Glucose, UA: NEGATIVE
Ketones, UA: NEGATIVE
Leukocytes, UA: NEGATIVE
Nitrite, UA: NEGATIVE
POC,PROTEIN,UA: NEGATIVE

## 2018-09-12 NOTE — Progress Notes (Signed)
INITIAL OBSTETRICAL VISIT Patient name: Taylor [name] MRN [id number]  Date of birth: Nov 01, 1990 Chief Complaint:   Initial Prenatal Visit  History of Present Illness:   Taylor [name] is a 28 y.o. G21P1001 Caucasian female at [redacted]w[redacted]d by 6 week [place] with an Estimated Date of Delivery: 03/19/19 being seen today for her initial obstetrical visit.   Her obstetrical history is significant for SVD of 8# 15 oz baby w/o problems. Has bicuspid aortic valve disease (doesn't have a cardiologist).  Says only sequelae is occ palpitations.  States that "some family medicine doctor" in [place] told her last year that she had elevated liver enzy  mes and a "false + hep c"  Very vague.   Today she reports having some lower jaw pain r/t poor dentition (resulting from breaking her jaw in an accident and being on liquids for 6 months).  Doesn't have a dentist. Taking tylenol and occ. Motrin.  Aware to take sparingly.. No LMP recorded (lmp unknown). Patient is pregnant. Last pap 1 year ago. Results were: normal Review of Systems:   Pertinent items are noted in HPI Denies cramping/contractions, leakage of fluid, vaginal bleeding, abnormal vaginal discharge w/ itching/odor/irritation, headaches, visual changes, shortness of breath, chest pain, abdominal pain, severe nausea/vomiting, or problems with urination or bowel movements unless otherwise stated above.  Pertinent History Reviewed:  Reviewed past medical,surgical, social, obstetrical and family history.  Reviewed problem list, medications and allergies. OB History  Gravida Para Term Preterm AB Living  2 1 1     1   SAB TAB Ectopic Multiple Live Births          1    # Outcome Date GA Lbr Len/2nd Weight Sex Delivery Anes PTL Lv  2 Current           1 Term 05/03/07 [redacted]w[redacted]d  8 lb 15 oz (4.054 kg) F Vag-Spont EPI N LIV   Physical Assessment:   Vitals:   09/12/18 1543  BP: 109/71  Pulse: 90  There is no height or weight on file to calculate BMI.       Physical  Examination:  General appearance - well appearing, and in no distress  Mental status - alert, oriented to person, place, and time  Psych:  She has a normal mood and affect  Skin - warm and dry, normal color, no suspicious lesions noted  Chest - effort normal, all lung fields clear to auscultation bilaterally  Heart - normal rate and regular rhythm  Abdomen - soft, nontender  Extremities:  No swelling or varicosities noted   [place] 13+1 wks,measurements c/w dates,crl 70.88 mm,fhr 157 bpm,NB present,NT 2.2 mm,normal right ovary,left ovary not visualized   Results for orders placed or performed in visit on 09/12/18 (from the past 24 hour(s))  POC Urinalysis Dipstick OB   Collection Time: 09/12/18  3:51 PM  Result Value Ref Range   Color, UA     Clarity, UA     Glucose, UA Negative Negative   Bilirubin, UA     Ketones, UA neg    Spec Grav, UA     Blood, UA neg    pH, UA     POC,PROTEIN,UA Negative Negative, Trace, Small (1+), Moderate (2+), Large (3+), 4+   Urobilinogen, UA     Nitrite, UA neg    Leukocytes, UA Negative Negative   Appearance     Odor      Assessment & Plan:  1) Low-Risk Pregnancy G2P1001 at 106w1d with an Estimated Date of  Delivery: 03/19/19   2) Initial OB visit  3) Sleep Apnea-has CPAP ordered  4) BAVD--denies being told she needs to be under cardiology care  5) Dental pain:  Encouraged to try to establish care w/dentist  6)  ? Abelina Bachelor enzymes d/t ? False + Hep C??:  Check CMP today; pt to try to figure out who the MD was that treated her last year so we can request records.   Meds: No orders of the defined types were placed in this encounter.   Initial labs obtained Continue prenatal vitamins Reviewed n/v relief measures and warning s/s to report Reviewed recommended weight gain based on pre-gravid BMI Encouraged well-balanced diet Watched video for carrier screening/genetic testing:  Genetic Screening discussed Integrated Screen: requested Cystic  fibrosis screening requested SMA screening requested Fragile X screening requested Ultrasound discussed; fetal survey: requested CCNC completed  Follow-up: Return in about 5 weeks (around 10/19/2018) for [place], [id number], 2nd IT.   Orders Placed This Encounter  Procedures  . GC/Chlamydia Probe Amp  . Urine Culture  . Integrated 1  . Obstetric Panel, Including HIV  . Urinalysis, Routine w reflex microscopic  . Sickle cell screen  . MaterniT 21 plus Core, Blood  . Inheritest Core(CF97,SMA,FraX)  . Pain Management Screening Profile (10S)  . Comprehensive metabolic panel  . POC Urinalysis Dipstick OB    Christin Fudge DNP, CNM 09/12/2018 4:04 PM

## 2018-09-12 NOTE — Patient Instructions (Signed)
 First Trimester of Pregnancy The first trimester of pregnancy is from week 1 until the end of week 12 (months 1 through 3). A week after a sperm fertilizes an egg, the egg will implant on the wall of the uterus. This embryo will begin to develop into a baby. Genes from you and your partner are forming the baby. The female genes determine whether the baby is a boy or a girl. At 6-8 weeks, the eyes and face are formed, and the heartbeat can be seen on ultrasound. At the end of 12 weeks, all the baby's organs are formed.  Now that you are pregnant, you will want to do everything you can to have a healthy baby. Two of the most important things are to get good prenatal care and to follow your health care provider's instructions. Prenatal care is all the medical care you receive before the baby's birth. This care will help prevent, find, and treat any problems during the pregnancy and childbirth. BODY CHANGES Your body goes through many changes during pregnancy. The changes vary from woman to woman.   You may gain or lose a couple of pounds at first.  You may feel sick to your stomach (nauseous) and throw up (vomit). If the vomiting is uncontrollable, call your health care provider.  You may tire easily.  You may develop headaches that can be relieved by medicines approved by your health care provider.  You may urinate more often. Painful urination may mean you have a bladder infection.  You may develop heartburn as a result of your pregnancy.  You may develop constipation because certain hormones are causing the muscles that push waste through your intestines to slow down.  You may develop hemorrhoids or swollen, bulging veins (varicose veins).  Your breasts may begin to grow larger and become tender. Your nipples may stick out more, and the tissue that surrounds them (areola) may become darker.  Your gums may bleed and may be sensitive to brushing and flossing.  Dark spots or blotches  (chloasma, mask of pregnancy) may develop on your face. This will likely fade after the baby is born.  Your menstrual periods will stop.  You may have a loss of appetite.  You may develop cravings for certain kinds of food.  You may have changes in your emotions from day to day, such as being excited to be pregnant or being concerned that something may go wrong with the pregnancy and baby.  You may have more vivid and strange dreams.  You may have changes in your hair. These can include thickening of your hair, rapid growth, and changes in texture. Some women also have hair loss during or after pregnancy, or hair that feels dry or thin. Your hair will most likely return to normal after your baby is born. WHAT TO EXPECT AT YOUR PRENATAL VISITS During a routine prenatal visit:  You will be weighed to make sure you and the baby are growing normally.  Your blood pressure will be taken.  Your abdomen will be measured to track your baby's growth.  The fetal heartbeat will be listened to starting around week 10 or 12 of your pregnancy.  Test results from any previous visits will be discussed. Your health care provider may ask you:  How you are feeling.  If you are feeling the baby move.  If you have had any abnormal symptoms, such as leaking fluid, bleeding, severe headaches, or abdominal cramping.  If you have any questions. Other   tests that may be performed during your first trimester include:  Blood tests to find your blood type and to check for the presence of any previous infections. They will also be used to check for low iron levels (anemia) and Rh antibodies. Later in the pregnancy, blood tests for diabetes will be done along with other tests if problems develop.  Urine tests to check for infections, diabetes, or protein in the urine.  An ultrasound to confirm the proper growth and development of the baby.  An amniocentesis to check for possible genetic problems.  Fetal  screens for spina bifida and Down syndrome.  You may need other tests to make sure you and the baby are doing well. HOME CARE INSTRUCTIONS  Medicines  Follow your health care provider's instructions regarding medicine use. Specific medicines may be either safe or unsafe to take during pregnancy.  Take your prenatal vitamins as directed.  If you develop constipation, try taking a stool softener if your health care provider approves. Diet  Eat regular, well-balanced meals. Choose a variety of foods, such as meat or vegetable-based protein, fish, milk and low-fat dairy products, vegetables, fruits, and whole grain breads and cereals. Your health care provider will help you determine the amount of weight gain that is right for you.  Avoid raw meat and uncooked cheese. These carry germs that can cause birth defects in the baby.  Eating four or five small meals rather than three large meals a day may help relieve nausea and vomiting. If you start to feel nauseous, eating a few soda crackers can be helpful. Drinking liquids between meals instead of during meals also seems to help nausea and vomiting.  If you develop constipation, eat more high-fiber foods, such as fresh vegetables or fruit and whole grains. Drink enough fluids to keep your urine clear or pale yellow. Activity and Exercise  Exercise only as directed by your health care provider. Exercising will help you:  Control your weight.  Stay in shape.  Be prepared for labor and delivery.  Experiencing pain or cramping in the lower abdomen or low back is a good sign that you should stop exercising. Check with your health care provider before continuing normal exercises.  Try to avoid standing for long periods of time. Move your legs often if you must stand in one place for a long time.  Avoid heavy lifting.  Wear low-heeled shoes, and practice good posture.  You may continue to have sex unless your health care provider directs you  otherwise. Relief of Pain or Discomfort  Wear a good support bra for breast tenderness.   Take warm sitz baths to soothe any pain or discomfort caused by hemorrhoids. Use hemorrhoid cream if your health care provider approves.   Rest with your legs elevated if you have leg cramps or low back pain.  If you develop varicose veins in your legs, wear support hose. Elevate your feet for 15 minutes, 3-4 times a day. Limit salt in your diet. Prenatal Care  Schedule your prenatal visits by the twelfth week of pregnancy. They are usually scheduled monthly at first, then more often in the last 2 months before delivery.  Write down your questions. Take them to your prenatal visits.  Keep all your prenatal visits as directed by your health care provider. Safety  Wear your seat belt at all times when driving.  Make a list of emergency phone numbers, including numbers for family, friends, the hospital, and police and fire departments. General   Tips  Ask your health care provider for a referral to a local prenatal education class. Begin classes no later than at the beginning of month 6 of your pregnancy.  Ask for help if you have counseling or nutritional needs during pregnancy. Your health care provider can offer advice or refer you to specialists for help with various needs.  Do not use hot tubs, steam rooms, or saunas.  Do not douche or use tampons or scented sanitary pads.  Do not cross your legs for long periods of time.  Avoid cat litter boxes and soil used by cats. These carry germs that can cause birth defects in the baby and possibly loss of the fetus by miscarriage or stillbirth.  Avoid all smoking, herbs, alcohol, and medicines not prescribed by your health care provider. Chemicals in these affect the formation and growth of the baby.  Schedule a dentist appointment. At home, brush your teeth with a soft toothbrush and be gentle when you floss. SEEK MEDICAL CARE IF:   You have  dizziness.  You have mild pelvic cramps, pelvic pressure, or nagging pain in the abdominal area.  You have persistent nausea, vomiting, or diarrhea.  You have a bad smelling vaginal discharge.  You have pain with urination.  You notice increased swelling in your face, hands, legs, or ankles. SEEK IMMEDIATE MEDICAL CARE IF:   You have a fever.  You are leaking fluid from your vagina.  You have spotting or bleeding from your vagina.  You have severe abdominal cramping or pain.  You have rapid weight gain or loss.  You vomit blood or material that looks like coffee grounds.  You are exposed to German measles and have never had them.  You are exposed to fifth disease or chickenpox.  You develop a severe headache.  You have shortness of breath.  You have any kind of trauma, such as from a fall or a car accident. Document Released: 04/21/2001 Document Revised: 09/11/2013 Document Reviewed: 03/07/2013 ExitCare Patient Information 2015 [employer]. This information is not intended to replace advice given to you by your health care provider. Make sure you discuss any questions you have with your health care provider.   Nausea & Vomiting  Have saltine crackers or pretzels by your bed and eat a few bites before you raise your head out of bed in the morning  Eat small frequent meals throughout the day instead of large meals  Drink plenty of fluids throughout the day to stay hydrated, just don't drink a lot of fluids with your meals.  This can make your stomach fill up faster making you feel sick  Do not brush your teeth right after you eat  Products with real ginger are good for nausea, like ginger ale and ginger hard candy Make sure it says made with real ginger!  Sucking on sour candy like lemon heads is also good for nausea  If your prenatal vitamins make you nauseated, take them at night so you will sleep through the nausea  Sea Bands  If you feel like you need  medicine for the nausea & vomiting please let us know  If you are unable to keep any fluids or food down please let us know   Constipation  Drink plenty of fluid, preferably water, throughout the day  Eat foods high in fiber such as fruits, vegetables, and grains  Exercise, such as walking, is a good way to keep your bowels regular  Drink warm fluids, especially warm   prune juice, or decaf coffee  Eat a 1/2 cup of real oatmeal (not instant), 1/2 cup applesauce, and 1/2-1 cup warm prune juice every day  If needed, you may take Colace (docusate sodium) stool softener once or twice a day to help keep the stool soft. If you are pregnant, wait until you are out of your first trimester (12-14 weeks of pregnancy)  If you still are having problems with constipation, you may take Miralax once daily as needed to help keep your bowels regular.  If you are pregnant, wait until you are out of your first trimester (12-14 weeks of pregnancy)  Safe Medications in Pregnancy   Acne: Benzoyl Peroxide Salicylic Acid  Backache/Headache: Tylenol: 2 regular strength every 4 hours OR              2 Extra strength every 6 hours  Colds/Coughs/Allergies: Benadryl (alcohol free) 25 mg every 6 hours as needed Breath right strips Claritin Cepacol throat lozenges Chloraseptic throat spray Cold-Eeze- up to three times per day Cough drops, alcohol free Flonase (by prescription only) Guaifenesin Mucinex Robitussin DM (plain only, alcohol free) Saline nasal spray/drops Sudafed (pseudoephedrine) & Actifed ** use only after [redacted] weeks gestation and if you do not have high blood pressure Tylenol Vicks Vaporub Zinc lozenges Zyrtec   Constipation: Colace Ducolax suppositories Fleet enema Glycerin suppositories Metamucil Milk of magnesia Miralax Senokot Smooth move tea  Diarrhea: Kaopectate Imodium A-D  *NO pepto Bismol  Hemorrhoids: Anusol Anusol HC Preparation  H Tucks  Indigestion: Tums Maalox Mylanta Zantac  Pepcid  Insomnia: Benadryl (alcohol free) 25mg every 6 hours as needed Tylenol PM Unisom, no Gelcaps  Leg Cramps: Tums MagGel  Nausea/Vomiting:  Bonine Dramamine Emetrol Ginger extract Sea bands Meclizine  Nausea medication to take during pregnancy:  Unisom (doxylamine succinate 25 mg tablets) Take one tablet daily at bedtime. If symptoms are not adequately controlled, the dose can be increased to a maximum recommended dose of two tablets daily (1/2 tablet in the morning, 1/2 tablet mid-afternoon and one at bedtime). Vitamin B6 100mg tablets. Take one tablet twice a day (up to 200 mg per day).  Skin Rashes: Aveeno products Benadryl cream or 25mg every 6 hours as needed Calamine Lotion 1% cortisone cream  Yeast infection: Gyne-lotrimin 7 Monistat 7   **If taking multiple medications, please check labels to avoid duplicating the same active ingredients **take medication as directed on the label ** Do not exceed 4000 mg of tylenol in 24 hours **Do not take medications that contain aspirin or ibuprofen      

## 2018-09-12 NOTE — Progress Notes (Signed)
[place] 13+1 wks,measurements c/w dates,crl 70.88 mm,fhr 157 bpm,NB present,NT 2.2 mm,normal right ovary,left ovary not visualized

## 2018-09-13 LAB — PMP SCREEN PROFILE (10S), URINE
Amphetamine Scrn, Ur: NEGATIVE ng/mL
BARBITURATE SCREEN URINE: NEGATIVE ng/mL
BENZODIAZEPINE SCREEN, URINE: NEGATIVE ng/mL
CANNABINOIDS UR QL SCN: NEGATIVE ng/mL
Cocaine (Metab) Scrn, Ur: NEGATIVE ng/mL
Creatinine(Crt), U: 72.5 mg/dL (ref 20.0–300.0)
Methadone Screen, Urine: NEGATIVE ng/mL
OXYCODONE+OXYMORPHONE UR QL SCN: NEGATIVE ng/mL
Opiate Scrn, Ur: NEGATIVE ng/mL
Ph of Urine: 5.9 (ref 4.5–8.9)
Phencyclidine Qn, Ur: NEGATIVE ng/mL
Propoxyphene Scrn, Ur: NEGATIVE ng/mL

## 2018-09-13 LAB — MED LIST OPTION NOT SELECTED

## 2018-09-14 LAB — URINE CULTURE: Organism ID, Bacteria: NO GROWTH

## 2018-09-21 LAB — GC/CHLAMYDIA PROBE AMP

## 2018-09-22 LAB — URINALYSIS, ROUTINE W REFLEX MICROSCOPIC
Bilirubin, UA: NEGATIVE
Glucose, UA: NEGATIVE
Ketones, UA: NEGATIVE
Leukocytes,UA: NEGATIVE
Nitrite, UA: NEGATIVE
Protein,UA: NEGATIVE
RBC, UA: NEGATIVE
Specific Gravity, UA: 1.017 (ref 1.005–1.030)
Urobilinogen, Ur: 0.2 mg/dL (ref 0.2–1.0)
pH, UA: 5.5 (ref 5.0–7.5)

## 2018-09-22 LAB — OBSTETRIC PANEL, INCLUDING HIV
Antibody Screen: NEGATIVE
Basophils Absolute: 0 10*3/uL (ref 0.0–0.2)
Basos: 0 %
EOS (ABSOLUTE): 0.2 10*3/uL (ref 0.0–0.4)
Eos: 1 %
HIV Screen 4th Generation wRfx: NONREACTIVE
Hematocrit: 32.8 % — ABNORMAL LOW (ref 34.0–46.6)
Hemoglobin: 11.4 g/dL (ref 11.1–15.9)
Hepatitis B Surface Ag: NEGATIVE
Immature Grans (Abs): 0.1 10*3/uL (ref 0.0–0.1)
Immature Granulocytes: 1 %
Lymphocytes Absolute: 2.6 10*3/uL (ref 0.7–3.1)
Lymphs: 23 %
MCH: 31.6 pg (ref 26.6–33.0)
MCHC: 34.8 g/dL (ref 31.5–35.7)
MCV: 91 fL (ref 79–97)
Monocytes Absolute: 0.5 10*3/uL (ref 0.1–0.9)
Monocytes: 5 %
Neutrophils Absolute: 8.1 10*3/uL — ABNORMAL HIGH (ref 1.4–7.0)
Neutrophils: 70 %
Platelets: 216 10*3/uL (ref 150–450)
RBC: 3.61 x10E6/uL — ABNORMAL LOW (ref 3.77–5.28)
RDW: 11.5 % — ABNORMAL LOW (ref 11.7–15.4)
RPR Ser Ql: NONREACTIVE
Rh Factor: POSITIVE
Rubella Antibodies, IGG: 6.01 index (ref >0.99)
WBC: 11.5 10*3/uL — ABNORMAL HIGH (ref 3.4–10.8)

## 2018-09-22 LAB — MATERNIT 21 PLUS CORE, BLOOD
Fetal Fraction: 8
Result (T21): NEGATIVE
Trisomy 13 (Patau syndrome): NEGATIVE
Trisomy 18 (Edwards syndrome): NEGATIVE
Trisomy 21 (Down syndrome): NEGATIVE

## 2018-09-22 LAB — INTEGRATED 1
Crown Rump Length: 70.9 mm
Gest. Age on Collection Date: 13.1 weeks
Maternal Age at EDD: 30.4 yr
Nuchal Translucency (NT): 2.2 mm
Number of Fetuses: 1
PAPP-A Value: 181 ng/mL
Weight: 253 [lb_av]

## 2018-09-22 LAB — SICKLE CELL SCREEN: Sickle Cell Screen: NEGATIVE

## 2018-09-22 LAB — COMPREHENSIVE METABOLIC PANEL
ALT: 13 IU/L (ref 0–32)
AST: 14 IU/L (ref 0–40)
Albumin/Globulin Ratio: 2.4 — ABNORMAL HIGH (ref 1.2–2.2)
Albumin: 4.5 g/dL (ref 3.9–5.0)
Alkaline Phosphatase: 50 IU/L (ref 39–117)
BUN/Creatinine Ratio: 16 (ref 9–23)
BUN: 8 mg/dL (ref 6–20)
Bilirubin Total: <0.2 mg/dL (ref 0.0–1.2)
CO2: 21 mmol/L (ref 20–29)
Calcium: 9.3 mg/dL (ref 8.7–10.2)
Chloride: 103 mmol/L (ref 96–106)
Creatinine, Ser: 0.51 mg/dL — ABNORMAL LOW (ref 0.57–1.00)
GFR calc Af Amer: 150 mL/min/{1.73_m2} (ref >59)
GFR calc non Af Amer: 130 mL/min/{1.73_m2} (ref >59)
Globulin, Total: 1.9 g/dL (ref 1.5–4.5)
Glucose: 85 mg/dL (ref 65–99)
Potassium: 3.9 mmol/L (ref 3.5–5.2)
Sodium: 135 mmol/L (ref 134–144)
Total Protein: 6.4 g/dL (ref 6.0–8.5)

## 2018-09-22 LAB — INHERITEST CORE(CF97,SMA,FRAX)

## 2018-09-29 ENCOUNTER — Telehealth: Payer: Self-pay | Admitting: Women's Health

## 2018-09-29 NOTE — Telephone Encounter (Signed)
Left message @ 3:20 pm. JSY

## 2018-09-29 NOTE — Telephone Encounter (Signed)
Pt wants to discuss the results of her lab results she just got off on my chart

## 2018-09-30 NOTE — Telephone Encounter (Signed)
Pt aware of JAG's recommendations and voiced understanding. [place]

## 2018-10-04 ENCOUNTER — Telehealth: Payer: Self-pay | Admitting: Obstetrics & Gynecology

## 2018-10-04 ENCOUNTER — Telehealth: Payer: Self-pay | Admitting: *Deleted

## 2018-10-04 NOTE — Telephone Encounter (Signed)
Patient states she has noticed increased swelling in her hands and feet over the past couple of days. Normally, she is able to increase her fluids and lay down and the swelling will go down, but that does not seem to be helping.  She is concerned that this may be related to her BAVD.  Discussed with Dr Glo Herring and will send in referral to cardiologist for her to have a visit and an echo done.  Pt verbalized understanding and agreeable to plan.   She is also having issues with her carpal tunnel flaring up at night.  Advised to try using wrist splints at night when she is sleeping.

## 2018-10-04 NOTE — Telephone Encounter (Signed)
Patient called stating that she is [redacted] weeks pregnant and she is having a lot of swelling of her hands feet and face. Pt states that Normally she drinks water and it goes away or she lays down and it goes away but lately it has not been working. Pt states that she is having trouble gripping things and it is falling. Please contact pt

## 2018-10-05 ENCOUNTER — Telehealth: Payer: Self-pay | Admitting: Cardiology

## 2018-10-05 ENCOUNTER — Other Ambulatory Visit: Payer: Self-pay | Admitting: *Deleted

## 2018-10-05 ENCOUNTER — Encounter: Payer: Self-pay | Admitting: Cardiology

## 2018-10-05 DIAGNOSIS — I359 Nonrheumatic aortic valve disorder, unspecified: Secondary | ICD-10-CM

## 2018-10-05 DIAGNOSIS — Z3482 Encounter for supervision of other normal pregnancy, second trimester: Secondary | ICD-10-CM

## 2018-10-05 DIAGNOSIS — Q231 Congenital insufficiency of aortic valve: Secondary | ICD-10-CM
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
## 2018-10-05 NOTE — Telephone Encounter (Signed)
Virtual Visit Pre-Appointment Phone Call  "(Name), I am calling you today to discuss your upcoming appointment. We are currently trying to limit exposure to the virus that causes COVID-19 by seeing patients at home rather than in the office."  1. "What is the BEST phone number to call the day of the visit?" - include this in appointment notes  2. Do you have or have access to (through a family member/friend) a smartphone with video capability that we can use for your visit?" a. If yes - list this number in appt notes as cell (if different from BEST phone #) and list the appointment type as a VIDEO visit in appointment notes b. If no - list the appointment type as a PHONE visit in appointment notes  3. Confirm consent - "In the setting of the current Covid19 crisis, you are scheduled for a (phone or video) visit with your provider on (date) at (time).  Just as we do with many in-office visits, in order for you to participate in this visit, we must obtain consent.  If you'd like, I can send this to your mychart (if signed up) or email for you to review.  Otherwise, I can obtain your verbal consent now.  All virtual visits are billed to your insurance company just like a normal visit would be.  By agreeing to a virtual visit, we'd like you to understand that the technology does not allow for your provider to perform an examination, and thus may limit your provider's ability to fully assess your condition. If your provider identifies any concerns that need to be evaluated in person, we will make arrangements to do so.  Finally, though the technology is pretty good, we cannot assure that it will always work on either your or our end, and in the setting of a video visit, we may have to convert it to a phone-only visit.  In either situation, we cannot ensure that we have a secure connection.  Are you willing to proceed?" STAFF: Did the patient verbally acknowledge consent to telehealth visit? Document  YES/NO here: Yes  4. Advise patient to be prepared - "Two hours prior to your appointment, go ahead and check your blood pressure, pulse, oxygen saturation, and your weight (if you have the equipment to check those) and write them all down. When your visit starts, your provider will ask you for this information. If you have an Apple Watch or Kardia device, please plan to have heart rate information ready on the day of your appointment. Please have a pen and paper handy nearby the day of the visit as well."  5. Give patient instructions for MyChart download to smartphone OR [email or web address] as below if video visit (depending on what platform provider is using)  6. Inform patient they will receive a phone call 15 minutes prior to their appointment time (may be from unknown caller ID) so they should be prepared to answer    Taylor [name] has been deemed a candidate for a follow-up tele-health visit to limit community exposure during the Covid-19 pandemic. I spoke with the patient via phone to ensure availability of phone/video source, confirm preferred email & phone number, and discuss instructions and expectations.  I reminded Taylor [name] to be prepared with any vital sign and/or heart rhythm information that could potentially be obtained via home monitoring, at the time of her visit. I reminded Taylor [name] to expect a phone call prior to her visit.  Terry L Goins 10/05/2018 1:01 PM

## 2018-10-06 ENCOUNTER — Other Ambulatory Visit: Payer: Self-pay

## 2018-10-06 ENCOUNTER — Telehealth (INDEPENDENT_AMBULATORY_CARE_PROVIDER_SITE_OTHER): Payer: Medicaid Other | Admitting: Cardiology

## 2018-10-06 ENCOUNTER — Encounter: Payer: Self-pay | Admitting: Cardiology

## 2018-10-06 VITALS — BP 127/73 | HR 89 | Ht 64.0 in

## 2018-10-06 DIAGNOSIS — Z7189 Other specified counseling: Secondary | ICD-10-CM

## 2018-10-06 DIAGNOSIS — Q231 Congenital insufficiency of aortic valve: Secondary | ICD-10-CM

## 2018-10-06 DIAGNOSIS — R002 Palpitations: Secondary | ICD-10-CM | POA: Diagnosis not present

## 2018-10-06 NOTE — Patient Instructions (Signed)
Medication Instructions: Your physician recommends that you continue on your current medications as directed. Please refer to the Current Medication list given to you today.   Labwork: None  Procedures/Testing: Your physician has requested that you have an echocardiogram. Echocardiography is a painless test that uses sound waves to create images of your heart. It provides your doctor with information about the size and shape of your heart and how well your heart's chambers and valves are working. This procedure takes approximately one hour. There are no restrictions for this procedure.  Your physician has recommended that you wear an event monitor. Event monitors are medical devices that record the heart's electrical activity. Doctors most often [place] these monitors to diagnose arrhythmias. Arrhythmias are problems with the speed or rhythm of the heartbeat. The monitor is a small, portable device. You can wear one while you do your normal daily activities. This is usually used to diagnose what is causing palpitations/syncope (passing out).   Follow-Up: We will call you with results  Any Additional Special Instructions Will Be Listed Below (If Applicable).     If you need a refill on your cardiac medications before your next appointment, please call your pharmacy.       Thank you for choosing [facility] !

## 2018-10-10 ENCOUNTER — Ambulatory Visit (INDEPENDENT_AMBULATORY_CARE_PROVIDER_SITE_OTHER): Payer: Medicaid Other

## 2018-10-10 DIAGNOSIS — R002 Palpitations: Secondary | ICD-10-CM

## 2018-10-11 ENCOUNTER — Other Ambulatory Visit: Payer: Self-pay

## 2018-10-11 ENCOUNTER — Ambulatory Visit (HOSPITAL_COMMUNITY)
Admission: RE | Admit: 2018-10-11 | Discharge: 2018-10-11 | Disposition: A | Discharge status: Home / Self Care | Payer: Medicaid Other | Source: Ambulatory Visit | Attending: Cardiology | Admitting: Cardiology

## 2018-10-11 DIAGNOSIS — Q231 Congenital insufficiency of aortic valve: Secondary | ICD-10-CM | POA: Diagnosis not present

## 2018-10-11 NOTE — Progress Notes (Signed)
*  PRELIMINARY RESULTS* Echocardiogram 2D Echocardiogram has been performed.  Leavy Cella 10/11/2018, 9:28 AM

## 2018-10-14 ENCOUNTER — Encounter: Payer: Self-pay | Admitting: *Deleted

## 2018-10-17 ENCOUNTER — Ambulatory Visit (INDEPENDENT_AMBULATORY_CARE_PROVIDER_SITE_OTHER): Payer: Medicaid Other | Admitting: Women's Health

## 2018-10-17 ENCOUNTER — Other Ambulatory Visit: Payer: Self-pay

## 2018-10-17 ENCOUNTER — Encounter: Payer: Self-pay | Admitting: Women's Health

## 2018-10-17 ENCOUNTER — Ambulatory Visit (INDEPENDENT_AMBULATORY_CARE_PROVIDER_SITE_OTHER): Payer: Medicaid Other

## 2018-10-17 VITALS — BP 107/64 | HR 77 | Temp 98.0°F | Wt 263.5 lb

## 2018-10-17 DIAGNOSIS — Q231 Congenital insufficiency of aortic valve: Secondary | ICD-10-CM

## 2018-10-17 DIAGNOSIS — Z1379 Encounter for other screening for genetic and chromosomal anomalies: Secondary | ICD-10-CM | POA: Diagnosis not present

## 2018-10-17 DIAGNOSIS — Z363 Encounter for antenatal screening for malformations: Secondary | ICD-10-CM

## 2018-10-17 DIAGNOSIS — L292 Pruritus vulvae: Secondary | ICD-10-CM

## 2018-10-17 DIAGNOSIS — R7989 Other specified abnormal findings of blood chemistry: Secondary | ICD-10-CM | POA: Insufficient documentation

## 2018-10-17 DIAGNOSIS — Z113 Encounter for screening for infections with a predominantly sexual mode of transmission: Secondary | ICD-10-CM

## 2018-10-17 DIAGNOSIS — Q2381 Bicuspid aortic valve: Secondary | ICD-10-CM

## 2018-10-17 DIAGNOSIS — Z3A18 18 weeks gestation of pregnancy: Secondary | ICD-10-CM | POA: Diagnosis not present

## 2018-10-17 DIAGNOSIS — Z1389 Encounter for screening for other disorder: Secondary | ICD-10-CM

## 2018-10-17 DIAGNOSIS — Z3482 Encounter for supervision of other normal pregnancy, second trimester: Secondary | ICD-10-CM

## 2018-10-17 DIAGNOSIS — R945 Abnormal results of liver function studies: Secondary | ICD-10-CM

## 2018-10-17 DIAGNOSIS — Z331 Pregnant state, incidental: Secondary | ICD-10-CM

## 2018-10-17 LAB — POCT WET PREP (WET MOUNT)
Clue Cells Wet Prep Whiff POC: NEGATIVE
Trichomonas Wet Prep HPF POC: ABSENT

## 2018-10-17 LAB — POCT URINALYSIS DIPSTICK OB
Blood, UA: NEGATIVE
Glucose, UA: NEGATIVE
Ketones, UA: NEGATIVE
Leukocytes, UA: NEGATIVE
Nitrite, UA: NEGATIVE
POC,PROTEIN,UA: NEGATIVE

## 2018-10-17 MED ORDER — BLOOD PRESSURE MONITOR MISC: 0 refills | Status: DC

## 2018-10-17 NOTE — Progress Notes (Signed)
LOW-RISK PREGNANCY VISIT Patient name: Taylor [name] MRN [id number]  Date of birth: [birth year] Chief Complaint:   Routine Prenatal Visit ([place], 2nd IT; itching and throbbing; ? yeast)  History of Present Illness:   Taylor [name] is a 28 y.o. G82P1001 female at [redacted]w[redacted]d with an Estimated Date of Delivery: 03/19/19 being seen today for ongoing management of a low-risk pregnancy.  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 Vulvar itching, on pcn for tooth infection. Contractions: Not present. Vag. Bleeding: None.  Movement: Present. denies leaking of fluid. Review of Systems:   Pertinent items are noted in HPI Denies abnormal vaginal discharge w/ itching/odor/irritation, headaches, visual changes, shortness of breath, chest pain, abdominal pain, severe nausea/vomiting, or problems with urination or bowel movements unless otherwise stated above. Pertinent History Reviewed:  Reviewed past medical,surgical, social, obstetrical and family history.  Reviewed problem list, medications and allergies. Physical Assessment:   Vitals:   10/17/18 1037  BP: 107/64  Pulse: 77  Temp: 98 F (36.7 C)  Weight: 263 lb 8 oz (119.5 kg)  Body mass index is 45.23 kg/m.        Physical Examination:   General appearance: Well appearing, and in no distress  Mental status: Alert, oriented to person, place, and time  Skin: Warm & dry  Cardiovascular: Normal heart rate noted  Respiratory: Normal respiratory effort, no distress  Abdomen: Soft, gravid, nontender  Pelvic: vulva normal, spec exam: mod amt thick clumpy nonodorous d/c c/w yeast         Extremities: Edema: Trace  Fetal Status:     Movement: Present    Results for orders placed or performed in visit on 10/17/18 (from the past 24 hour(s))  POC Urinalysis Dipstick OB   Collection Time: 10/17/18 10:30 AM  Result Value Ref Range   Color,  UA     Clarity, UA     Glucose, UA Negative Negative   Bilirubin, UA     Ketones, UA neg    Spec Grav, UA     Blood, UA neg    pH, UA     POC,PROTEIN,UA Negative Negative, Trace, Small (1+), Moderate (2+), Large (3+), 4+   Urobilinogen, UA     Nitrite, UA neg    Leukocytes, UA Negative Negative   Appearance     Odor    POCT Wet Prep [facility])   Collection Time: 10/17/18 11:13 AM  Result Value Ref Range   Source Wet Prep POC vaginal    WBC, Wet Prep HPF POC few    Bacteria Wet Prep HPF POC Few Few   BACTERIA WET PREP MORPHOLOGY POC     Clue Cells Wet Prep HPF POC None None   Clue Cells Wet Prep Whiff POC Negative Whiff    Yeast Wet Prep HPF POC Moderate (A) None   KOH Wet Prep POC     Trichomonas Wet Prep HPF POC Absent Absent    Assessment & Plan:  1) Low-risk pregnancy G2P1001 at [redacted]w[redacted]d with an Estimated Date of Delivery: 03/19/19   2) Biscupid aortic valve, card appt at end of May, normal echo 10/11/18, no further f/u needed, no fetal echo per [facility]  3) Vulvovaginal candida> otc 7 night yeast cream   Meds:  Meds ordered this encounter  Medications  . Blood Pressure Monitor MISC    Sig: For regular home bp monitoring  during pregnancy    Dispense:  1 each    Refill:  0    Dx: z34.90, large cuff    Order Specific Question:   Supervising Provider    Answer:   Florian Buff [[id number]]   Labs/procedures today: 2nd IT, anatomy u/s, wet prep, gc/ct  Plan:  Continue routine obstetrical care   Reviewed: Preterm labor symptoms and general obstetric precautions including but not limited to vaginal bleeding, contractions, leaking of fluid and fetal movement were reviewed in detail with the patient.  All questions were answered. Does not have home bp cuff. Rx faxed to [facility]. Check bp weekly, let us know if >140/90.   Follow-up: Return in about 4 weeks (around 11/14/2018) for LROB webex.  Orders Placed This Encounter  Procedures  . GC/Chlamydia Probe Amp  .  INTEGRATED 2  . POC Urinalysis Dipstick OB  . POCT Wet Prep [facility])   Roma Schanz CNM, [facility] 10/17/2018 11:16 AM

## 2018-10-17 NOTE — Progress Notes (Signed)
[place] 09+2 wks,cephalic, anterior placenta gr 0,normal ovaries bilat,cx 3.3 cm,fhr 159 bpm,svp of fluid 3.9 cm,efw 240 g 63%,anatomy complete,no obvious abnormalities

## 2018-10-17 NOTE — Patient Instructions (Signed)
Taylor [name], I greatly value your feedback.  If you receive a survey following your visit with [place] today, we appreciate you taking the time to fill it out.  Thanks, Taylor [name], CNM, [facility]  [place]!!! It is now [place] at [facility] ([place], [place] [address]) Entrance located off of [address] parking   Home Blood Pressure Monitoring for Patients   Your provider has recommended that you check your blood pressure (BP) at least once a week at home. If you do not have a blood pressure cuff at home, one will be provided for you. Contact your provider if you have not received your monitor within 1 week.   Helpful Tips for Accurate Home Blood Pressure Checks  . Don't smoke, exercise, or drink caffeine 30 minutes before checking your BP . Use the restroom before checking your BP (a full bladder can raise your pressure) . Relax in a comfortable upright chair . Feet on the ground . Left arm resting comfortably on a flat surface at the level of your heart . Legs uncrossed . Back supported . Sit quietly and don't talk . Place the cuff on your bare arm . Adjust snuggly, so that only two fingertips can fit between your skin and the top of the cuff . Check 2 readings separated by at least one minute . Keep a log of your BP readings . For a visual, please reference this diagram: http://ccnc.care/bpdiagram  Provider Name: [facility]     Phone: [phone]  Zone 1: ALL CLEAR  Continue to monitor your symptoms:  . BP reading is less than 140 (top number) or less than 90 (bottom number)  . No right upper stomach pain . No headaches or seeing spots . No feeling nauseated or throwing up . No swelling in face and hands  Zone 2: CAUTION Call your doctor's office for any of the following:  . BP reading is greater than 140 (top number) or greater than 90 (bottom number)  . Stomach pain under your ribs in the middle  or right side . Headaches or seeing spots . Feeling nauseated or throwing up . Swelling in face and hands  Zone 3: EMERGENCY  Seek immediate medical care if you have any of the following:  . BP reading is greater than160 (top number) or greater than 110 (bottom number) . Severe headaches not improving with Tylenol . Serious difficulty catching your breath . Any worsening symptoms from Zone 2    For Dizzy Spells:   This is usually related to either your blood sugar or your blood pressure dropping  Make sure you are staying well hydrated and drinking enough water so that your urine is clear  Eat small frequent meals and snacks containing protein (meat, eggs, nuts, cheese) so that your blood sugar doesn't drop  If you do get dizzy, sit/lay down and get you something to drink and a snack containing protein- you will usually start feeling better in 10-20 minutes       Second Trimester of Pregnancy The second trimester is from week 14 through week 27 (months 4 through 6). The second trimester is often a time when you feel your best. Your body has adjusted to being pregnant, and you begin to feel better physically. Usually, morning sickness has lessened or quit completely, you may have more energy, and you may have an increase in appetite. The second trimester is also a  time when the fetus is growing rapidly. At the end of the sixth month, the fetus is about 9 inches long and weighs about 1 pounds. You will likely begin to feel the baby move (quickening) between 16 and 20 weeks of pregnancy. Body changes during your second trimester Your body continues to go through many changes during your second trimester. The changes vary from woman to woman.  Your weight will continue to increase. You will notice your lower abdomen bulging out.  You may begin to get stretch marks on your hips, abdomen, and breasts.  You may develop headaches that can be relieved by medicines. The medicines should be  approved by your health care provider.  You may urinate more often because the fetus is pressing on your bladder.  You may develop or continue to have heartburn as a result of your pregnancy.  You may develop constipation because certain hormones are causing the muscles that push waste through your intestines to slow down.  You may develop hemorrhoids or swollen, bulging veins (varicose veins).  You may have back pain. This is caused by: ? Weight gain. ? Pregnancy hormones that are relaxing the joints in your pelvis. ? A shift in weight and the muscles that support your balance.  Your breasts will continue to grow and they will continue to become tender.  Your gums may bleed and may be sensitive to brushing and flossing.  Dark spots or blotches (chloasma, mask of pregnancy) may develop on your face. This will likely fade after the baby is born.  A dark line from your belly button to the pubic area (linea nigra) may appear. This will likely fade after the baby is born.  You may have changes in your hair. These can include thickening of your hair, rapid growth, and changes in texture. Some women also have hair loss during or after pregnancy, or hair that feels dry or thin. Your hair will most likely return to normal after your baby is born.  What to expect at prenatal visits During a routine prenatal visit:  You will be weighed to make sure you and the fetus are growing normally.  Your blood pressure will be taken.  Your abdomen will be measured to track your baby's growth.  The fetal heartbeat will be listened to.  Any test results from the previous visit will be discussed.  Your health care provider may ask you:  How you are feeling.  If you are feeling the baby move.  If you have had any abnormal symptoms, such as leaking fluid, bleeding, severe headaches, or abdominal cramping.  If you are using any tobacco products, including cigarettes, chewing tobacco, and  electronic cigarettes.  If you have any questions.  Other tests that may be performed during your second trimester include:  Blood tests that check for: ? Low iron levels (anemia). ? High blood sugar that affects pregnant women (gestational diabetes) between 36 and 28 weeks. ? Rh antibodies. This is to check for a protein on red blood cells (Rh factor).  Urine tests to check for infections, diabetes, or protein in the urine.  An ultrasound to confirm the proper growth and development of the baby.  An amniocentesis to check for possible genetic problems.  Fetal screens for spina bifida and Down syndrome.  HIV (human immunodeficiency virus) testing. Routine prenatal testing includes screening for HIV, unless you choose not to have this test.  Follow these instructions at home: Medicines  Follow your health care provider's instructions  regarding medicine use. Specific medicines may be either safe or unsafe to take during pregnancy.  Take a prenatal vitamin that contains at least 600 micrograms (mcg) of folic acid.  If you develop constipation, try taking a stool softener if your health care provider approves. Eating and drinking  Eat a balanced diet that includes fresh fruits and vegetables, whole grains, good sources of protein such as meat, eggs, or tofu, and low-fat dairy. Your health care provider will help you determine the amount of weight gain that is right for you.  Avoid raw meat and uncooked cheese. These carry germs that can cause birth defects in the baby.  If you have low calcium intake from food, talk to your health care provider about whether you should take a daily calcium supplement.  Limit foods that are high in fat and processed sugars, such as fried and sweet foods.  To prevent constipation: ? Drink enough fluid to keep your urine clear or pale yellow. ? Eat foods that are high in fiber, such as fresh fruits and vegetables, whole grains, and beans. Activity   Exercise only as directed by your health care provider. Most women can continue their usual exercise routine during pregnancy. Try to exercise for 30 minutes at least 5 days a week. Stop exercising if you experience uterine contractions.  Avoid heavy lifting, wear low heel shoes, and practice good posture.  A sexual relationship may be continued unless your health care provider directs you otherwise. Relieving pain and discomfort  Wear a good support bra to prevent discomfort from breast tenderness.  Take warm sitz baths to soothe any pain or discomfort caused by hemorrhoids. Use hemorrhoid cream if your health care provider approves.  Rest with your legs elevated if you have leg cramps or low back pain.  If you develop varicose veins, wear support hose. Elevate your feet for 15 minutes, 3-4 times a day. Limit salt in your diet. Prenatal Care  Write down your questions. Take them to your prenatal visits.  Keep all your prenatal visits as told by your health care provider. This is important. Safety  Wear your seat belt at all times when driving.  Make a list of emergency phone numbers, including numbers for family, friends, the hospital, and police and fire departments. General instructions  Ask your health care provider for a referral to a local prenatal education class. Begin classes no later than the beginning of month 6 of your pregnancy.  Ask for help if you have counseling or nutritional needs during pregnancy. Your health care provider can offer advice or refer you to specialists for help with various needs.  Do not use hot tubs, steam rooms, or saunas.  Do not douche or use tampons or scented sanitary pads.  Do not cross your legs for long periods of time.  Avoid cat litter boxes and soil used by cats. These carry germs that can cause birth defects in the baby and possibly loss of the fetus by miscarriage or stillbirth.  Avoid all smoking, herbs, alcohol, and  unprescribed drugs. Chemicals in these products can affect the formation and growth of the baby.  Do not use any products that contain nicotine or tobacco, such as cigarettes and e-cigarettes. If you need help quitting, ask your health care provider.  Visit your dentist if you have not gone yet during your pregnancy. Use a soft toothbrush to brush your teeth and be gentle when you floss. Contact a health care provider if:  You have  dizziness.  You have mild pelvic cramps, pelvic pressure, or nagging pain in the abdominal area.  You have persistent nausea, vomiting, or diarrhea.  You have a bad smelling vaginal discharge.  You have pain when you urinate. Get help right away if:  You have a fever.  You are leaking fluid from your vagina.  You have spotting or bleeding from your vagina.  You have severe abdominal cramping or pain.  You have rapid weight gain or weight loss.  You have shortness of breath with chest pain.  You notice sudden or extreme swelling of your face, hands, ankles, feet, or legs.  You have not felt your baby move in over an hour.  You have severe headaches that do not go away when you take medicine.  You have vision changes. Summary  The second trimester is from week 14 through week 27 (months 4 through 6). It is also a time when the fetus is growing rapidly.  Your body goes through many changes during pregnancy. The changes vary from woman to woman.  Avoid all smoking, herbs, alcohol, and unprescribed drugs. These chemicals affect the formation and growth your baby.  Do not use any tobacco products, such as cigarettes, chewing tobacco, and e-cigarettes. If you need help quitting, ask your health care provider.  Contact your health care provider if you have any questions. Keep all prenatal visits as told by your health care provider. This is important. This information is not intended to replace advice given to you by your health care provider. Make  sure you discuss any questions you have with your health care provider. Document Released: 04/21/2001 Document Revised: 10/03/2015 Document Reviewed: 06/28/2012 Elsevier Interactive Patient Education  2017 [employer].

## 2018-10-18 ENCOUNTER — Telehealth: Payer: Self-pay | Admitting: Women's Health

## 2018-10-18 NOTE — Telephone Encounter (Signed)
Pt states that after her appt she was in the car and almost passed out and had a weak pulse. She states her husband was driving. Pt did not go to the hospital but wanted the dr to be aware.

## 2018-10-19 LAB — INTEGRATED 2
AFP MoM: 1.41
Alpha-Fetoprotein: 35.3 ng/mL
Crown Rump Length: 70.9 mm
DIA MoM: 1.19
DIA Value: 145 pg/mL
Estriol, Unconjugated: 0.99 ng/mL
Gest. Age on Collection Date: 13.1 weeks
Gestational Age: 18.1 weeks
Maternal Age at EDD: 30.4 yr
Nuchal Translucency (NT): 2.2 mm
Nuchal Translucency MoM: 1.36
Number of Fetuses: 1
PAPP-A MoM: 0.27
PAPP-A Value: 181 ng/mL
Test Results:: NEGATIVE
Weight: 253 [lb_av]
Weight: 264 [lb_av]
hCG MoM: 0.92
hCG Value: 15 IU/mL
uE3 MoM: 0.85

## 2018-10-25 LAB — GC/CHLAMYDIA PROBE AMP
Chlamydia trachomatis, NAA: NEGATIVE
Neisseria Gonorrhoeae by PCR: NEGATIVE

## 2018-10-27 DIAGNOSIS — Z3490 Encounter for supervision of normal pregnancy, unspecified, unspecified trimester: Secondary | ICD-10-CM | POA: Diagnosis not present

## 2018-11-01 ENCOUNTER — Other Ambulatory Visit: Payer: Self-pay

## 2018-11-01 DIAGNOSIS — R002 Palpitations: Secondary | ICD-10-CM

## 2018-11-03 ENCOUNTER — Telehealth: Payer: Self-pay | Admitting: *Deleted

## 2018-11-03 ENCOUNTER — Telehealth: Payer: Self-pay | Admitting: Women's Health

## 2018-11-03 NOTE — Telephone Encounter (Signed)
Patient called, stated she saw blood in her underwear this morning, very concerned.  No cramping or pain.  She's 20 weeks.  [phone]

## 2018-11-03 NOTE — Telephone Encounter (Signed)
LMOVM returning patient's call.  

## 2018-11-03 NOTE — Telephone Encounter (Signed)
Patient states she had some very light brown spotting this morning. No cramping but a little discomfort on her right lower side when she bent over at work this morning.  Informed patient the dark blood was old blood and should not be anything to worry about.  The pain on the right was related to round ligament which is very common in pregnancy.  Advised if bleeding became heavier or if she starting have more pain in the center of her abdomen to let us know.  Pt verbalized understanding.

## 2018-11-14 ENCOUNTER — Other Ambulatory Visit: Payer: Self-pay

## 2018-11-14 ENCOUNTER — Encounter: Payer: Self-pay | Admitting: Women's Health

## 2018-11-14 ENCOUNTER — Ambulatory Visit (INDEPENDENT_AMBULATORY_CARE_PROVIDER_SITE_OTHER): Payer: Medicaid Other | Admitting: Women's Health

## 2018-11-14 VITALS — BP 124/76 | HR 95

## 2018-11-14 DIAGNOSIS — Z3482 Encounter for supervision of other normal pregnancy, second trimester: Secondary | ICD-10-CM

## 2018-11-14 DIAGNOSIS — Z3A22 22 weeks gestation of pregnancy: Secondary | ICD-10-CM

## 2018-11-14 NOTE — Patient Instructions (Signed)
Taylor [name], I greatly value your feedback.  If you receive a survey following your visit with [place] today, we appreciate you taking the time to fill it out.  Thanks, [name], CNM, WHNP-BC   You will have your sugar test next visit.  Please do not eat or drink anything after midnight the night before you come, not even water.  You will be here for at least two hours.     [place]!!! It is now [place] at [facility] ([place], [place] [address]) Entrance located off of [place] parking   Call the office [phone]) or go to [facility] if:  You begin to have strong, frequent contractions  Your water breaks.  Sometimes it is a big gush of fluid, sometimes it is just a trickle that keeps getting your panties wet or running down your legs  You have vaginal bleeding.  It is normal to have a small amount of spotting if your cervix was checked.   You don't feel your baby moving like normal.  If you don't, get you something to eat and drink and lay down and focus on feeling your baby move.   If your baby is still not moving like normal, you should call the office or go to [place] [facility]   Your provider has recommended that you check your blood pressure (BP) at least once a week at home. If you do not have a blood pressure cuff at home, one will be provided for you. Contact your provider if you have not received your monitor within 1 week.   Helpful Tips for Accurate Home Blood Pressure Checks  . Don't smoke, exercise, or drink caffeine 30 minutes before checking your BP . Use the restroom before checking your BP (a full bladder can raise your pressure) . Relax in a comfortable upright chair . Feet on the ground . Left arm resting comfortably on a flat surface at the level of your heart . Legs uncrossed . Back supported . Sit quietly and don't talk . Place the cuff  on your bare arm . Adjust snuggly, so that only two fingertips can fit between your skin and the top of the cuff . Check 2 readings separated by at least one minute . Keep a log of your BP readings . For a visual, please reference this diagram: http://ccnc.care/bpdiagram  Provider Name: [facility]     Phone: [phone]  Zone 1: ALL CLEAR  Continue to monitor your symptoms:  . BP reading is less than 140 (top number) or less than 90 (bottom number)  . No right upper stomach pain . No headaches or seeing spots . No feeling nauseated or throwing up . No swelling in face and hands  Zone 2: CAUTION Call your doctor's office for any of the following:  . BP reading is greater than 140 (top number) or greater than 90 (bottom number)  . Stomach pain under your ribs in the middle or right side . Headaches or seeing spots . Feeling nauseated or throwing up . Swelling in face and hands  Zone 3: EMERGENCY  Seek immediate medical care if you have any of the following:  . BP reading is greater than160 (top number) or greater than 110 (bottom number) . Severe headaches not improving with Tylenol . Serious difficulty catching your breath . Any worsening symptoms from Zone 2  Second Trimester of Pregnancy The second trimester is from week 13 through week 28, months 4 through 6. The second trimester is often a time when you feel your best. Your body has also adjusted to being pregnant, and you begin to feel better physically. Usually, morning sickness has lessened or quit completely, you may have more energy, and you may have an increase in appetite. The second trimester is also a time when the fetus is growing rapidly. At the end of the sixth month, the fetus is about 9 inches long and weighs about 1 pounds. You will likely begin to feel the baby move (quickening) between 18 and 20 weeks of the pregnancy. BODY CHANGES Your body goes through many changes during pregnancy. The changes vary  from woman to woman.   Your weight will continue to increase. You will notice your lower abdomen bulging out.  You may begin to get stretch marks on your hips, abdomen, and breasts.  You may develop headaches that can be relieved by medicines approved by your health care provider.  You may urinate more often because the fetus is pressing on your bladder.  You may develop or continue to have heartburn as a result of your pregnancy.  You may develop constipation because certain hormones are causing the muscles that push waste through your intestines to slow down.  You may develop hemorrhoids or swollen, bulging veins (varicose veins).  You may have back pain because of the weight gain and pregnancy hormones relaxing your joints between the bones in your pelvis and as a result of a shift in weight and the muscles that support your balance.  Your breasts will continue to grow and be tender.  Your gums may bleed and may be sensitive to brushing and flossing.  Dark spots or blotches (chloasma, mask of pregnancy) may develop on your face. This will likely fade after the baby is born.  A dark line from your belly button to the pubic area (linea nigra) may appear. This will likely fade after the baby is born.  You may have changes in your hair. These can include thickening of your hair, rapid growth, and changes in texture. Some women also have hair loss during or after pregnancy, or hair that feels dry or thin. Your hair will most likely return to normal after your baby is born. WHAT TO EXPECT AT YOUR PRENATAL VISITS During a routine prenatal visit:  You will be weighed to make sure you and the fetus are growing normally.  Your blood pressure will be taken.  Your abdomen will be measured to track your baby's growth.  The fetal heartbeat will be listened to.  Any test results from the previous visit will be discussed. Your health care provider may ask you:  How you are feeling.  If  you are feeling the baby move.  If you have had any abnormal symptoms, such as leaking fluid, bleeding, severe headaches, or abdominal cramping.  If you have any questions. Other tests that may be performed during your second trimester include:  Blood tests that check for:  Low iron levels (anemia).  Gestational diabetes (between 24 and 28 weeks).  Rh antibodies.  Urine tests to check for infections, diabetes, or protein in the urine.  An ultrasound to confirm the proper growth and development of the baby.  An amniocentesis to check for possible genetic problems.  Fetal screens for spina bifida and Down syndrome. HOME CARE INSTRUCTIONS   Avoid all smoking, herbs, alcohol, and  unprescribed drugs. These chemicals affect the formation and growth of the baby.  Follow your health care provider's instructions regarding medicine use. There are medicines that are either safe or unsafe to take during pregnancy.  Exercise only as directed by your health care provider. Experiencing uterine cramps is a good sign to stop exercising.  Continue to eat regular, healthy meals.  Wear a good support bra for breast tenderness.  Do not use hot tubs, steam rooms, or saunas.  Wear your seat belt at all times when driving.  Avoid raw meat, uncooked cheese, cat litter boxes, and soil used by cats. These carry germs that can cause birth defects in the baby.  Take your prenatal vitamins.  Try taking a stool softener (if your health care provider approves) if you develop constipation. Eat more high-fiber foods, such as fresh vegetables or fruit and whole grains. Drink plenty of fluids to keep your urine clear or pale yellow.  Take warm sitz baths to soothe any pain or discomfort caused by hemorrhoids. Use hemorrhoid cream if your health care provider approves.  If you develop varicose veins, wear support hose. Elevate your feet for 15 minutes, 3-4 times a day. Limit salt in your diet.  Avoid  heavy lifting, wear low heel shoes, and practice good posture.  Rest with your legs elevated if you have leg cramps or low back pain.  Visit your dentist if you have not gone yet during your pregnancy. Use a soft toothbrush to brush your teeth and be gentle when you floss.  A sexual relationship may be continued unless your health care provider directs you otherwise.  Continue to go to all your prenatal visits as directed by your health care provider. SEEK MEDICAL CARE IF:   You have dizziness.  You have mild pelvic cramps, pelvic pressure, or nagging pain in the abdominal area.  You have persistent nausea, vomiting, or diarrhea.  You have a bad smelling vaginal discharge.  You have pain with urination. SEEK IMMEDIATE MEDICAL CARE IF:   You have a fever.  You are leaking fluid from your vagina.  You have spotting or bleeding from your vagina.  You have severe abdominal cramping or pain.  You have rapid weight gain or loss.  You have shortness of breath with chest pain.  You notice sudden or extreme swelling of your face, hands, ankles, feet, or legs.  You have not felt your baby move in over an hour.  You have severe headaches that do not go away with medicine.  You have vision changes. Document Released: 04/21/2001 Document Revised: 05/02/2013 Document Reviewed: 06/28/2012 [facility] Patient Information 2015 [place], [place]. This information is not intended to replace advice given to you by your health care provider. Make sure you discuss any questions you have with your health care provider.

## 2018-11-14 NOTE — Progress Notes (Signed)
   TELEHEALTH VIRTUAL OBSTETRICS VISIT ENCOUNTER NOTE Patient name: Taylor [name] MRN [id number]  Date of birth: [birth year]  I connected with patient on 11/14/18 at  9:15 AM EDT by [facility] and verified that I am speaking with the correct person using two identifiers. Due to COVID-19 recommendations, pt is not currently in our office.    I discussed the limitations, risks, security and privacy concerns of performing an evaluation and management service by telephone and the availability of in person appointments. I also discussed with the patient that there may be a patient responsible charge related to this service. The patient expressed understanding and agreed to proceed.  Chief Complaint:   Routine Prenatal Visit (blister tongue)  History of Present Illness:   Taylor [name] is a 28 y.o. G20P1001 female at [redacted]w[redacted]d with an Estimated Date of Delivery: 03/19/19 being evaluated today for ongoing management of a low-risk pregnancy.  Today she reports cracked feet, bleed when being up at work at [employer], blister on tongue. Enlarging mole lower abd.  Contractions: Not present.  .  Movement: Present. denies leaking of fluid. Review of Systems:   Pertinent items are noted in HPI Denies abnormal vaginal discharge w/ itching/odor/irritation, headaches, visual changes, shortness of breath, chest pain, abdominal pain, severe nausea/vomiting, or problems with urination or bowel movements unless otherwise stated above. Pertinent History Reviewed:  Reviewed past medical,surgical, social, obstetrical and family history.  Reviewed problem list, medications and allergies. Physical Assessment:   Vitals:   11/14/18 1002  BP: 124/76  Pulse: 95  There is no height or weight on file to calculate BMI.        Physical Examination:   General:  Alert, oriented and cooperative.   Mental Status: Normal mood and affect perceived. Normal judgment and thought content.  Rest of physical exam deferred due to type of  encounter Large irregular nevi lower abdomen  No results found for this or any previous visit (from the past 24 hour(s)).  Assessment & Plan:  1) Pregnancy G2P1001 at [redacted]w[redacted]d with an Estimated Date of Delivery: 03/19/19   2) Cracked feet, scrub w/ pumice stone, put [place]'s foot repair cream, and socks  3) Blister on tongue> avoid anything that's going to aggravate (citrus/acid/salt)  4) Large irregular nevi lower abdomen> want to assess in person, then will likely recommend seeing dermatology   Meds: No orders of the defined types were placed in this encounter.   Labs/procedures today: none  Plan:  Continue routine obstetrical care.  Has home bp cuff.  Check bp weekly, let us know if >140/90.   Reviewed: Preterm labor symptoms and general obstetric precautions including but not limited to vaginal bleeding, contractions, leaking of fluid and fetal movement were reviewed in detail with the patient. The patient was advised to call back or seek an in-person office evaluation/go to [facility] for any urgent or concerning symptoms. All questions were answered. Please refer to After Visit Summary for other counseling recommendations.    I provided 15 minutes of non-face-to-face time during this encounter.  Follow-up: Return in about 5 weeks (around 12/19/2018) for LROB, in person, PN2, tdap.  No orders of the defined types were placed in this encounter.  Roma Schanz CNM, [facility] 11/14/2018 10:26 AM

## 2018-11-17 ENCOUNTER — Telehealth: Payer: Self-pay | Admitting: Women's Health

## 2018-11-17 NOTE — Telephone Encounter (Signed)
Patient called with complaints of canker sores spreading in her mouth. LMOVM for her to call and make sooner appt than 8/10 to have provider take a look .

## 2018-11-17 NOTE — Telephone Encounter (Signed)
Pt states that she is getting canker sores in her mouth and they are spreading. She is wanting to see what she should use to get rid of them.

## 2018-12-05 ENCOUNTER — Other Ambulatory Visit: Payer: Self-pay

## 2018-12-05 ENCOUNTER — Ambulatory Visit (INDEPENDENT_AMBULATORY_CARE_PROVIDER_SITE_OTHER): Payer: Medicaid Other | Admitting: Pulmonary Disease

## 2018-12-05 ENCOUNTER — Encounter: Payer: Self-pay | Admitting: Pulmonary Disease

## 2018-12-05 VITALS — BP 124/68 | HR 93 | Temp 98.3°F | Ht 64.0 in | Wt 273.0 lb

## 2018-12-05 DIAGNOSIS — G4719 Other hypersomnia: Secondary | ICD-10-CM | POA: Diagnosis not present

## 2018-12-05 DIAGNOSIS — G4733 Obstructive sleep apnea (adult) (pediatric): Secondary | ICD-10-CM

## 2018-12-05 NOTE — Addendum Note (Signed)
Addended by: Elton Sin on: 12/05/2018 03:08 PM   Modules accepted: Orders

## 2018-12-05 NOTE — Patient Instructions (Signed)
History of obstructive sleep apnea with no significant symptoms during pregnancy Witnessed apneas  Order a home sleep study Contact you as soon as results become available and set you up with a CPAP  Optimize sleep and get an adequate amount of sleep at night as best as you can  We will see you back in the office in about 3 months Call with significant concerns

## 2018-12-05 NOTE — Addendum Note (Signed)
Addended by: Elton Sin on: 12/05/2018 11:43 AM   Modules accepted: Orders
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
## 2018-12-12 ENCOUNTER — Other Ambulatory Visit (HOSPITAL_COMMUNITY)
Admission: RE | Admit: 2018-12-12 | Discharge: 2018-12-12 | Disposition: A | Discharge status: Home / Self Care | Payer: Medicaid Other | Source: Ambulatory Visit | Attending: Pulmonary Disease | Admitting: Pulmonary Disease

## 2018-12-12 DIAGNOSIS — Z01812 Encounter for preprocedural laboratory examination: Secondary | ICD-10-CM | POA: Diagnosis not present

## 2018-12-12 DIAGNOSIS — Z20828 Contact with and (suspected) exposure to other viral communicable diseases: Secondary | ICD-10-CM | POA: Diagnosis not present

## 2018-12-12 LAB — SARS CORONAVIRUS 2 (TAT 6-24 HRS): SARS Coronavirus 2: NEGATIVE

## 2018-12-14 ENCOUNTER — Other Ambulatory Visit: Payer: Self-pay

## 2018-12-14 ENCOUNTER — Ambulatory Visit: Payer: Medicaid Other | Attending: Pulmonary Disease | Admitting: Pulmonary Disease

## 2018-12-14 DIAGNOSIS — G4733 Obstructive sleep apnea (adult) (pediatric): Secondary | ICD-10-CM | POA: Insufficient documentation

## 2018-12-15 ENCOUNTER — Telehealth: Payer: Self-pay | Admitting: Obstetrics & Gynecology

## 2018-12-15 ENCOUNTER — Telehealth: Payer: Self-pay | Admitting: *Deleted

## 2018-12-15 NOTE — Telephone Encounter (Signed)
Patient called, stated that she had the COVID test done Monday, never got a call with results, but it was for her sleep study she had done last night.  She feels bad, scratchy throat and coughing.  She wants to know what to do.  [phone]

## 2018-12-15 NOTE — Telephone Encounter (Signed)
Patient called back wanting to know what meds she could take for sore throat and cough.  She did have a nasal canula during her sleep study. Informed that could be the reason for her sore throat as COVID test was negative.  Advised she could try using cough drops, chloraseptic spray or Robitussin for the cough.  Encouraged to push extra fluids today.  Stated she missed work because she did not know if she could work.  Will put note in chart.

## 2018-12-15 NOTE — Telephone Encounter (Signed)
LMOVM advising patient monitor and treat symptoms as needed.  Advised to let us know prior to appt on Monday if she is still experiencing these symptoms even though Covid screen negative.

## 2018-12-19 ENCOUNTER — Ambulatory Visit (INDEPENDENT_AMBULATORY_CARE_PROVIDER_SITE_OTHER): Payer: Medicaid Other | Admitting: Obstetrics & Gynecology

## 2018-12-19 ENCOUNTER — Encounter: Payer: Self-pay | Admitting: Obstetrics & Gynecology

## 2018-12-19 ENCOUNTER — Other Ambulatory Visit: Payer: Self-pay

## 2018-12-19 ENCOUNTER — Other Ambulatory Visit: Payer: Medicaid Other

## 2018-12-19 VITALS — BP 133/77 | HR 89 | Wt 273.4 lb

## 2018-12-19 DIAGNOSIS — Z3402 Encounter for supervision of normal first pregnancy, second trimester: Secondary | ICD-10-CM

## 2018-12-19 DIAGNOSIS — Z331 Pregnant state, incidental: Secondary | ICD-10-CM

## 2018-12-19 DIAGNOSIS — Z23 Encounter for immunization: Secondary | ICD-10-CM

## 2018-12-19 DIAGNOSIS — Z3482 Encounter for supervision of other normal pregnancy, second trimester: Secondary | ICD-10-CM

## 2018-12-19 DIAGNOSIS — Z1389 Encounter for screening for other disorder: Secondary | ICD-10-CM

## 2018-12-19 DIAGNOSIS — Z3A27 27 weeks gestation of pregnancy: Secondary | ICD-10-CM | POA: Diagnosis not present

## 2018-12-19 LAB — POCT URINALYSIS DIPSTICK OB
Blood, UA: NEGATIVE
Glucose, UA: NEGATIVE
Ketones, UA: NEGATIVE
Leukocytes, UA: NEGATIVE
Nitrite, UA: NEGATIVE
POC,PROTEIN,UA: NEGATIVE

## 2018-12-19 NOTE — Progress Notes (Signed)
   LOW-RISK PREGNANCY VISIT Patient name: Taylor [name] MRN [id number]  Date of birth: 27-Mar-1991 Chief Complaint:   Routine Prenatal Visit (PN2 / tdap)  History of Present Illness:   Taylor [name] is a 28 y.o. G57P1001 female at [redacted]w[redacted]d with an Estimated Date of Delivery: 03/19/19 being seen today for ongoing management of a low-risk pregnancy.  Today she reports no complaints. Contractions: Regular.  .  Movement: Present. denies leaking of fluid. Review of Systems:   Pertinent items are noted in HPI Denies abnormal vaginal discharge w/ itching/odor/irritation, headaches, visual changes, shortness of breath, chest pain, abdominal pain, severe nausea/vomiting, or problems with urination or bowel movements unless otherwise stated above. Pertinent History Reviewed:  Reviewed past medical,surgical, social, obstetrical and family history.  Reviewed problem list, medications and allergies. Physical Assessment:   Vitals:   12/19/18 0922  BP: 133/77  Pulse: 89  Weight: 273 lb 6.4 oz (124 kg)  Body mass index is 46.93 kg/m.        Physical Examination:   General appearance: Well appearing, and in no distress  Mental status: Alert, oriented to person, place, and time  Skin: Warm & dry  Cardiovascular: Normal heart rate noted  Respiratory: Normal respiratory effort, no distress  Abdomen: Soft, gravid, nontender  Pelvic: Cervical exam deferred         Extremities: Edema: Trace  Fetal Status: Fetal Heart Rate (bpm): 140 Fundal Height: 30 cm Movement: Present    Results for orders placed or performed in visit on 12/19/18 (from the past 24 hour(s))  POC Urinalysis Dipstick OB   Collection Time: 12/19/18  9:30 AM  Result Value Ref Range   Color, UA     Clarity, UA     Glucose, UA Negative Negative   Bilirubin, UA     Ketones, UA neg    Spec Grav, UA     Blood, UA neg    pH, UA     POC,PROTEIN,UA Negative Negative, Trace, Small (1+), Moderate (2+), Large (3+), 4+   Urobilinogen, UA      Nitrite, UA neg    Leukocytes, UA Negative Negative   Appearance     Odor      Assessment & Plan:  1) Low-risk pregnancy G2P1001 at [redacted]w[redacted]d with an Estimated Date of Delivery: 03/19/19      Meds: No orders of the defined types were placed in this encounter.  Labs/procedures today:   Plan:  Continue routine obstetrical care   Reviewed: Preterm labor symptoms and general obstetric precautions including but not limited to vaginal bleeding, contractions, leaking of fluid and fetal movement were reviewed in detail with the patient.  All questions were answered.  home bp cuff. Rx faxed to . Check bp weekly, let us know if >140/90.   Follow-up: Return in about 3 weeks (around 01/09/2019) for [place].  Orders Placed This Encounter  Procedures  . Tdap vaccine greater than or equal to 7yo IM  . POC Urinalysis Dipstick OB   Florian Buff  12/19/2018 9:53 AM

## 2018-12-20 ENCOUNTER — Other Ambulatory Visit: Payer: Self-pay | Admitting: Women's Health

## 2018-12-20 MED ORDER — FERROUS SULFATE 325 (65 FE) MG PO TABS: 325.0000 mg | ORAL_TABLET | Freq: Two times a day (BID) | ORAL | 3 refills | Status: DC

## 2018-12-21 LAB — CBC
Hematocrit: 30.4 % — ABNORMAL LOW (ref 34.0–46.6)
Hemoglobin: 10.3 g/dL — ABNORMAL LOW (ref 11.1–15.9)
MCH: 30.9 pg (ref 26.6–33.0)
MCHC: 33.9 g/dL (ref 31.5–35.7)
MCV: 91 fL (ref 79–97)
Platelets: 214 10*3/uL (ref 150–450)
RBC: 3.33 x10E6/uL — ABNORMAL LOW (ref 3.77–5.28)
RDW: 11.9 % (ref 11.7–15.4)
WBC: 11.2 10*3/uL — ABNORMAL HIGH (ref 3.4–10.8)

## 2018-12-21 LAB — GLUCOSE TOLERANCE, 2 HOURS W/ 1HR
Glucose, 1 hour: 159 mg/dL (ref 65–179)
Glucose, 2 hour: 109 mg/dL (ref 65–152)
Glucose, Fasting: 84 mg/dL (ref 65–91)

## 2018-12-21 LAB — RPR: RPR Ser Ql: NONREACTIVE

## 2018-12-21 LAB — ANTIBODY SCREEN: Antibody Screen: NEGATIVE

## 2018-12-21 LAB — HIV ANTIBODY (ROUTINE TESTING W REFLEX): HIV Screen 4th Generation wRfx: NONREACTIVE

## 2018-12-23 ENCOUNTER — Telehealth: Payer: Self-pay | Admitting: Pulmonary Disease

## 2018-12-23 NOTE — Progress Notes (Signed)
POLYSOMNOGRAPHY  Last, First: Taylor [name] MRN: [id number] Gender: Female Age (years): 28 Weight (lbs): 273 DOB: 10/16/1990 BMI: 47 Primary Care: No PCP Epworth Score: 6 Referring: Laurin Coder MD Technician: Peak, Robert Interpreting: Laurin Coder MD Study Type: NPSG Ordered Study Type: NPSG Study date: 12/14/2018 Location: [place] CLINICAL INFORMATION Taylor [name] is a 28 year old Female and was referred to the sleep center for evaluation of G47.30 Sleep Apnea, Unspecified (780.57). Indications include N/A.  MEDICATIONS Patient self administered medications include: N/A. Medications administered during study include Sleep medicine administered - Tylenol PM at 08:42:36 PM  SLEEP STUDY TECHNIQUE A multi-channel overnight Polysomnography study was performed. The channels recorded and monitored were central and occipital EEG, electrooculogram (EOG), submentalis EMG (chin), nasal and oral airflow, thoracic and abdominal wall motion, anterior tibialis EMG, snore microphone, electrocardiogram, and a pulse oximetry. TECHNICIAN COMMENTS Comments added by Technician: Patient had more than two awakenings to use the bathroom Comments added by Scorer: N/A SLEEP ARCHITECTURE The study was initiated at 9:41:26 PM and terminated at 4:22:12 AM. The total recorded time was 400.8 minutes. EEG confirmed total sleep time was 213.5 minutes yielding a sleep efficiency of 53.3%%. Sleep onset after lights out was 27.7 minutes with a REM latency of 292.5 minutes. The patient spent 11.0%% of the night in stage N1 sleep, 66.5%% in stage N2 sleep, 17.1%% in stage N3 and 5.4% in REM. Wake after sleep onset (WASO) was 159.6 minutes. The Arousal Index was 23.9/hour. RESPIRATORY PARAMETERS There were a total of 10 respiratory disturbances out of which 0 were apneas ( 0 obstructive, 0 mixed, 0 central) and 10 hypopneas. The apnea/hypopnea index (AHI) was 2.8 events/hour. The central sleep apnea  index was 0.0 events/hour. The REM AHI was 31.3 events/hour and NREM AHI was 1.2 events/hour. The supine AHI was 5.6 events/hour and the non supine AHI was 2.5 supine during 10.07% of sleep. Respiratory disturbances were associated with oxygen desaturation down to a nadir of 88.0% during sleep. The mean oxygen saturation during the study was 94.2%. The cumulative time under 88% oxygen saturation was 5.5 minutes.  LEG MOVEMENT DATA The total leg movements were 0 with a resulting leg movement index of 0.0/hr .Associated arousal with leg movement index was 0.0/hr.  CARDIAC DATA The underlying cardiac rhythm was most consistent with sinus rhythm. Mean heart rate during sleep was 85.1 bpm. Additional rhythm abnormalities include None.   IMPRESSIONS - No Significant Obstructive Sleep apnea(OSA) - EKG showed no cardiac abnormalities. - Mild Oxygen Desaturation - The patient snored with moderate snoring volume. - No significant periodic leg movements(PLMs) during sleep. However, no significant associated arousals.   DIAGNOSIS - Mild nocturnal Hypoxemia (327.26 [G47.36 ICD-10]) - Negative for significant sleep disordered breathing - Multiple awakenings  - Snoring   RECOMMENDATIONS - Avoid alcohol, sedatives and other CNS depressants that may worsen sleep apnea and disrupt normal sleep architecture. - Sleep position modification may help snoring. - Sleep hygiene should be reviewed to assess factors that may improve sleep quality. - Weight management and regular exercise should be initiated or continued.  [Electronically signed] 12/23/2018 05:49 AM  Sherrilyn Rist MD NPI: [id number]

## 2018-12-23 NOTE — Procedures (Signed)
  POLYSOMNOGRAPHY  Last, First: Taylor, [name] MRN: [id number] Gender: Female Age (years): 28 Weight (lbs): 273 DOB: [birth year] BMI: 47 Primary Care: No PCP Epworth Score: 6 Referring: Laurin Coder MD Technician: Peak, Robert Interpreting: Laurin Coder MD Study Type: NPSG Ordered Study Type: NPSG Study date: 12/14/2018 Location: [place] CLINICAL INFORMATION Taylor [name] is a 28 year old Female and was referred to the sleep center for evaluation of G47.30 Sleep Apnea, Unspecified (780.57). Indications include N/A.  MEDICATIONS Patient self administered medications include: N/A. Medications administered during study include Sleep medicine administered - Tylenol PM at 08:42:36 PM  SLEEP STUDY TECHNIQUE A multi-channel overnight Polysomnography study was performed. The channels recorded and monitored were central and occipital EEG, electrooculogram (EOG), submentalis EMG (chin), nasal and oral airflow, thoracic and abdominal wall motion, anterior tibialis EMG, snore microphone, electrocardiogram, and a pulse oximetry. TECHNICIAN COMMENTS Comments added by Technician: Patient had more than two awakenings to use the bathroom Comments added by Scorer: N/A SLEEP ARCHITECTURE The study was initiated at 9:41:26 PM and terminated at 4:22:12 AM. The total recorded time was 400.8 minutes. EEG confirmed total sleep time was 213.5 minutes yielding a sleep efficiency of 53.3%%. Sleep onset after lights out was 27.7 minutes with a REM latency of 292.5 minutes. The patient spent 11.0%% of the night in stage N1 sleep, 66.5%% in stage N2 sleep, 17.1%% in stage N3 and 5.4% in REM. Wake after sleep onset (WASO) was 159.6 minutes. The Arousal Index was 23.9/hour. RESPIRATORY PARAMETERS There were a total of 10 respiratory disturbances out of which 0 were apneas ( 0 obstructive, 0 mixed, 0 central) and 10 hypopneas. The apnea/hypopnea index (AHI) was 2.8 events/hour. The central sleep  apnea index was 0.0 events/hour. The REM AHI was 31.3 events/hour and NREM AHI was 1.2 events/hour. The supine AHI was 5.6 events/hour and the non supine AHI was 2.5 supine during 10.07% of sleep. Respiratory disturbances were associated with oxygen desaturation down to a nadir of 88.0% during sleep. The mean oxygen saturation during the study was 94.2%. The cumulative time under 88% oxygen saturation was 5.5 minutes.  LEG MOVEMENT DATA The total leg movements were 0 with a resulting leg movement index of 0.0/hr .Associated arousal with leg movement index was 0.0/hr.  CARDIAC DATA The underlying cardiac rhythm was most consistent with sinus rhythm. Mean heart rate during sleep was 85.1 bpm. Additional rhythm abnormalities include None.   IMPRESSIONS - No Significant Obstructive Sleep apnea(OSA) - EKG showed no cardiac abnormalities. - Mild Oxygen Desaturation - The patient snored with moderate snoring volume. - No significant periodic leg movements(PLMs) during sleep. However, no significant associated arousals.   DIAGNOSIS - Mild nocturnal Hypoxemia (327.26 [G47.36 ICD-10]) - Negative for significant sleep disordered breathing - Multiple awakenings  - Snoring   RECOMMENDATIONS - Avoid alcohol, sedatives and other CNS depressants that may worsen sleep apnea and disrupt normal sleep architecture. - Sleep position modification may help snoring. - Sleep hygiene should be reviewed to assess factors that may improve sleep quality. - Weight management and regular exercise should be initiated or continued.  [Electronically signed] 12/23/2018 05:49 AM  Sherrilyn Rist MD NPI: [id number]

## 2018-12-23 NOTE — Telephone Encounter (Signed)
Sleep study result  Date of study: 12/14/2018  Impression: Negative study for significant sleep disordered breathing Primary snoring  Recommendation:  Aggressive weight loss measures and regular exercise may help sleep quality Optimize sleep hygiene Discourage supine sleep  Routine follow-up

## 2018-12-23 NOTE — Telephone Encounter (Signed)
Attempted to call pt but unable to reach. Left message for pt to return call. 

## 2018-12-28 NOTE — Telephone Encounter (Signed)
Pt returning call.  [phone].

## 2018-12-28 NOTE — Telephone Encounter (Signed)
Spoke with patient. She is aware of results. She stated that she does not know why the test did not show any events but she is sure that she has OSA.   She is currently pregnant and wants to know what else can she do. She is not able to sleep on her back now due to this discomfort. She wants to know if she should do the test again.   Dr. Ander Slade, please advise. Thanks!

## 2018-12-29 NOTE — Telephone Encounter (Signed)
It is one night out of so many, her sleep on that night may not have been typical of what she does on a regular basis. It is still the most optimal way of testing for obstructive sleep apnea with the above limitation. Behavioral modifications as recommended is most appropriate.  She did have some events, worse when she laid on her back and worse during the during phase of sleep, treatment is based on averaging what she does through the night.  My recommendation is lateral sleep as able, elevating the head of the bed if possible  A repeat study can be ordered-treatment with CPAP will still be dependent on her having significant enough events to be treated.  This may be ordered after putting above in place and she is still having significant events and symptoms

## 2018-12-29 NOTE — Telephone Encounter (Signed)
LMOMTCB x 1 

## 2019-01-04 ENCOUNTER — Other Ambulatory Visit: Payer: Self-pay | Admitting: Family

## 2019-01-04 DIAGNOSIS — F321 Major depressive disorder, single episode, moderate: Secondary | ICD-10-CM

## 2019-01-04 DIAGNOSIS — F411 Generalized anxiety disorder: Secondary | ICD-10-CM

## 2019-01-07 ENCOUNTER — Ambulatory Visit (INDEPENDENT_AMBULATORY_CARE_PROVIDER_SITE_OTHER)
Admission: RE | Admit: 2019-01-07 | Discharge: 2019-01-07 | Disposition: A | Discharge status: Home / Self Care | Payer: Medicaid Other | Source: Ambulatory Visit

## 2019-01-07 DIAGNOSIS — Z3A30 30 weeks gestation of pregnancy: Secondary | ICD-10-CM | POA: Diagnosis not present

## 2019-01-07 DIAGNOSIS — K644 Residual hemorrhoidal skin tags: Secondary | ICD-10-CM

## 2019-01-07 DIAGNOSIS — K649 Unspecified hemorrhoids: Secondary | ICD-10-CM

## 2019-01-07 DIAGNOSIS — R110 Nausea: Secondary | ICD-10-CM

## 2019-01-07 DIAGNOSIS — R197 Diarrhea, unspecified: Secondary | ICD-10-CM

## 2019-01-07 DIAGNOSIS — Z3493 Encounter for supervision of normal pregnancy, unspecified, third trimester: Secondary | ICD-10-CM

## 2019-01-07 DIAGNOSIS — K529 Noninfective gastroenteritis and colitis, unspecified: Secondary | ICD-10-CM | POA: Diagnosis not present

## 2019-01-07 MED ORDER — LOPERAMIDE HCL 2 MG PO CAPS: 2.0000 mg | ORAL_CAPSULE | Freq: Every day | ORAL | 0 refills | Status: DC | PRN

## 2019-01-07 MED ORDER — ONDANSETRON 4 MG PO TBDP: 4.0000 mg | ORAL_TABLET | Freq: Three times a day (TID) | ORAL | 0 refills | Status: DC | PRN

## 2019-01-07 NOTE — Discharge Instructions (Signed)
Please make sure that you hydrate very well with water and mix it with some Gatorade.  Eat light meals including soups, soft fruits to maintain your hydration, electrolytes and nutrition.  You may continue using Preparation H for your hemorrhoids as it is helping.  Use Zofran once every 8 hours for nausea and vomiting.  Take 1 dose of loperamide today and hold off on using this again unless your diarrhea persists.  If your symptoms worsen it may be necessary to be seen at the [facility].
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
## 2019-01-09 ENCOUNTER — Ambulatory Visit (INDEPENDENT_AMBULATORY_CARE_PROVIDER_SITE_OTHER): Payer: Medicaid Other | Admitting: Women's Health

## 2019-01-09 ENCOUNTER — Encounter: Payer: Self-pay | Admitting: Women's Health

## 2019-01-09 ENCOUNTER — Other Ambulatory Visit: Payer: Self-pay

## 2019-01-09 VITALS — BP 131/73 | HR 108 | Wt 272.0 lb

## 2019-01-09 DIAGNOSIS — Z3483 Encounter for supervision of other normal pregnancy, third trimester: Secondary | ICD-10-CM

## 2019-01-09 DIAGNOSIS — Z1389 Encounter for screening for other disorder: Secondary | ICD-10-CM

## 2019-01-09 DIAGNOSIS — A630 Anogenital (venereal) warts: Secondary | ICD-10-CM

## 2019-01-09 DIAGNOSIS — Z3A30 30 weeks gestation of pregnancy: Secondary | ICD-10-CM

## 2019-01-09 DIAGNOSIS — Z331 Pregnant state, incidental: Secondary | ICD-10-CM

## 2019-01-09 LAB — POCT URINALYSIS DIPSTICK OB
Blood, UA: NEGATIVE
Glucose, UA: NEGATIVE
Ketones, UA: NEGATIVE
Leukocytes, UA: NEGATIVE
Nitrite, UA: NEGATIVE
POC,PROTEIN,UA: NEGATIVE

## 2019-01-09 MED ORDER — TRICHLOROACETIC ACID 80 % EX LIQD: CUTANEOUS | 0 refills | Status: DC

## 2019-01-09 NOTE — Patient Instructions (Signed)
Taylor [name], I greatly value your feedback.  If you receive a survey following your visit with [place] today, we appreciate you taking the time to fill it out.  Thanks, [name], CNM, [facility]  [place]!!! It is now [place] at [facility] ([place], [place] [address]) Entrance located off of [address] parking    Go to [email or web address] to register for FREE online childbirth classes   Call the office [phone]) or go to [facility] if:  You begin to have strong, frequent contractions  Your water breaks.  Sometimes it is a big gush of fluid, sometimes it is just a trickle that keeps getting your panties wet or running down your legs  You have vaginal bleeding.  It is normal to have a small amount of spotting if your cervix was checked.   You don't feel your baby moving like normal.  If you don't, get you something to eat and drink and lay down and focus on feeling your baby move.  You should feel at least 10 movements in 2 hours.  If you don't, you should call the office or go to [facility].    Tdap Vaccine  It is recommended that you get the Tdap vaccine during the third trimester of EACH pregnancy to help protect your baby from getting pertussis (whooping cough)  27-36 weeks is the BEST time to do this so that you can pass the protection on to your baby. During pregnancy is better than after pregnancy, but if you are unable to get it during pregnancy it will be offered at the hospital.   You can get this vaccine with [place], at the health department, your family doctor, or some local pharmacies  Everyone who will be around your baby should also be up-to-date on their vaccines before the baby comes. Adults (who are not pregnant) only need 1 dose of Tdap during adulthood.   [place] Pediatricians/Family Doctors:  [facility] [phone]                  [place] [phone] (usually not accepting new patients unless you have family there already, you are always welcome to call and ask)       [facility] Department [phone]       [facility] Pediatricians/Family Doctors:   [facility]: [phone]  [facility]: [phone]  [facility]: [address] Doctors:   [facility]: [place] [facility]: [place]:  [place]: [phone]   Home Blood Pressure Monitoring for Patients   Your provider has recommended that you check your blood pressure (BP) at least once a week at home. If you do not have a blood pressure cuff at home, one will be provided for you. Contact your provider if you have not received your monitor within 1 week.   Helpful Tips for Accurate Home Blood Pressure Checks  . Don't smoke, exercise, or drink caffeine 30 minutes before checking your BP . Use the restroom before checking your BP (a full bladder can raise your pressure) . Relax in a comfortable upright chair . Feet on the ground . Left arm resting comfortably on a flat surface at the level of your heart . Legs uncrossed . Back supported . Sit quietly and don't talk .  Place the cuff on your bare arm . Adjust snuggly, so that only two fingertips can fit between your skin and the top of the cuff . Check 2 readings separated by at least one minute . Keep a log of your BP readings . For a visual, please reference this diagram: http://ccnc.care/bpdiagram  Provider Name: [facility]     Phone: [phone]  Zone 1: ALL CLEAR  Continue to monitor your symptoms:  . BP reading is less than 140 (top number) or less than 90 (bottom number)  . No right upper stomach pain . No headaches or seeing spots . No feeling nauseated or throwing up . No swelling in face and  hands  Zone 2: CAUTION Call your doctor's office for any of the following:  . BP reading is greater than 140 (top number) or greater than 90 (bottom number)  . Stomach pain under your ribs in the middle or right side . Headaches or seeing spots . Feeling nauseated or throwing up . Swelling in face and hands  Zone 3: EMERGENCY  Seek immediate medical care if you have any of the following:  . BP reading is greater than160 (top number) or greater than 110 (bottom number) . Severe headaches not improving with Tylenol . Serious difficulty catching your breath . Any worsening symptoms from Zone 2   Third Trimester of Pregnancy The third trimester is from week 29 through week 42, months 7 through 9. The third trimester is a time when the fetus is growing rapidly. At the end of the ninth month, the fetus is about 20 inches in length and weighs 6-10 pounds.  BODY CHANGES Your body goes through many changes during pregnancy. The changes vary from woman to woman.   Your weight will continue to increase. You can expect to gain 25-35 pounds (11-16 kg) by the end of the pregnancy.  You may begin to get stretch marks on your hips, abdomen, and breasts.  You may urinate more often because the fetus is moving lower into your pelvis and pressing on your bladder.  You may develop or continue to have heartburn as a result of your pregnancy.  You may develop constipation because certain hormones are causing the muscles that push waste through your intestines to slow down.  You may develop hemorrhoids or swollen, bulging veins (varicose veins).  You may have pelvic pain because of the weight gain and pregnancy hormones relaxing your joints between the bones in your pelvis. Backaches may result from overexertion of the muscles supporting your posture.  You may have changes in your hair. These can include thickening of your hair, rapid growth, and changes in texture. Some women also have hair loss during  or after pregnancy, or hair that feels dry or thin. Your hair will most likely return to normal after your baby is born.  Your breasts will continue to grow and be tender. A yellow discharge may leak from your breasts called colostrum.  Your belly button may stick out.  You may feel short of breath because of your expanding uterus.  You may notice the fetus "dropping," or moving lower in your abdomen.  You may have a bloody mucus discharge. This usually occurs a few days to a week before labor begins.  Your cervix becomes thin and soft (effaced) near your due date. WHAT TO EXPECT AT YOUR PRENATAL EXAMS  You will have prenatal exams every 2 weeks until week 36. Then, you will have weekly prenatal exams. During  a routine prenatal visit:  You will be weighed to make sure you and the fetus are growing normally.  Your blood pressure is taken.  Your abdomen will be measured to track your baby's growth.  The fetal heartbeat will be listened to.  Any test results from the previous visit will be discussed.  You may have a cervical check near your due date to see if you have effaced. At around 36 weeks, your caregiver will check your cervix. At the same time, your caregiver will also perform a test on the secretions of the vaginal tissue. This test is to determine if a type of bacteria, Group B streptococcus, is present. Your caregiver will explain this further. Your caregiver may ask you:  What your birth plan is.  How you are feeling.  If you are feeling the baby move.  If you have had any abnormal symptoms, such as leaking fluid, bleeding, severe headaches, or abdominal cramping.  If you have any questions. Other tests or screenings that may be performed during your third trimester include:  Blood tests that check for low iron levels (anemia).  Fetal testing to check the health, activity level, and growth of the fetus. Testing is done if you have certain medical conditions or if  there are problems during the pregnancy. FALSE LABOR You may feel small, irregular contractions that eventually go away. These are called Braxton Hicks contractions, or false labor. Contractions may last for hours, days, or even weeks before true labor sets in. If contractions come at regular intervals, intensify, or become painful, it is best to be seen by your caregiver.  SIGNS OF LABOR   Menstrual-like cramps.  Contractions that are 5 minutes apart or less.  Contractions that start on the top of the uterus and spread down to the lower abdomen and back.  A sense of increased pelvic pressure or back pain.  A watery or bloody mucus discharge that comes from the vagina. If you have any of these signs before the 37th week of pregnancy, call your caregiver right away. You need to go to the hospital to get checked immediately. HOME CARE INSTRUCTIONS   Avoid all smoking, herbs, alcohol, and unprescribed drugs. These chemicals affect the formation and growth of the baby.  Follow your caregiver's instructions regarding medicine use. There are medicines that are either safe or unsafe to take during pregnancy.  Exercise only as directed by your caregiver. Experiencing uterine cramps is a good sign to stop exercising.  Continue to eat regular, healthy meals.  Wear a good support bra for breast tenderness.  Do not use hot tubs, steam rooms, or saunas.  Wear your seat belt at all times when driving.  Avoid raw meat, uncooked cheese, cat litter boxes, and soil used by cats. These carry germs that can cause birth defects in the baby.  Take your prenatal vitamins.  Try taking a stool softener (if your caregiver approves) if you develop constipation. Eat more high-fiber foods, such as fresh vegetables or fruit and whole grains. Drink plenty of fluids to keep your urine clear or pale yellow.  Take warm sitz baths to soothe any pain or discomfort caused by hemorrhoids. Use hemorrhoid cream if your  caregiver approves.  If you develop varicose veins, wear support hose. Elevate your feet for 15 minutes, 3-4 times a day. Limit salt in your diet.  Avoid heavy lifting, wear low heal shoes, and practice good posture.  Rest a lot with your legs elevated if you  have leg cramps or low back pain.  Visit your dentist if you have not gone during your pregnancy. Use a soft toothbrush to brush your teeth and be gentle when you floss.  A sexual relationship may be continued unless your caregiver directs you otherwise.  Do not travel far distances unless it is absolutely necessary and only with the approval of your caregiver.  Take prenatal classes to understand, practice, and ask questions about the labor and delivery.  Make a trial run to the hospital.  Pack your hospital bag.  Prepare the baby's nursery.  Continue to go to all your prenatal visits as directed by your caregiver. SEEK MEDICAL CARE IF:  You are unsure if you are in labor or if your water has broken.  You have dizziness.  You have mild pelvic cramps, pelvic pressure, or nagging pain in your abdominal area.  You have persistent nausea, vomiting, or diarrhea.  You have a bad smelling vaginal discharge.  You have pain with urination. SEEK IMMEDIATE MEDICAL CARE IF:   You have a fever.  You are leaking fluid from your vagina.  You have spotting or bleeding from your vagina.  You have severe abdominal cramping or pain.  You have rapid weight loss or gain.  You have shortness of breath with chest pain.  You notice sudden or extreme swelling of your face, hands, ankles, feet, or legs.  You have not felt your baby move in over an hour.  You have severe headaches that do not go away with medicine.  You have vision changes. Document Released: 04/21/2001 Document Revised: 05/02/2013 Document Reviewed: 06/28/2012 [facility] Patient Information 2015 [place], [place]. This information is not intended to replace advice  given to you by your health care provider. Make sure you discuss any questions you have with your health care provider.

## 2019-01-09 NOTE — Progress Notes (Signed)
   LOW-RISK PREGNANCY VISIT Patient name: Taylor [name] MRN [id number]  Date of birth: November 29, 1990 Chief Complaint:   Routine Prenatal Visit  History of Present Illness:   Taylor [name] is a 28 y.o. G57P1001 female at [redacted]w[redacted]d with an Estimated Date of Delivery: 03/19/19 being seen today for ongoing management of a low-risk pregnancy.  Today she reports mole getting bigger on right mons. Contractions: Not present. Vag. Bleeding: None.  Movement: Present. denies leaking of fluid. Review of Systems:   Pertinent items are noted in HPI Denies abnormal vaginal discharge w/ itching/odor/irritation, headaches, visual changes, shortness of breath, chest pain, abdominal pain, severe nausea/vomiting, or problems with urination or bowel movements unless otherwise stated above. Pertinent History Reviewed:  Reviewed past medical,surgical, social, obstetrical and family history.  Reviewed problem list, medications and allergies. Physical Assessment:   Vitals:   01/09/19 0857  BP: 131/73  Pulse: (!) 108  Weight: 272 lb (123.4 kg)  Body mass index is 46.69 kg/m.        Physical Examination:   General appearance: Well appearing, and in no distress  Mental status: Alert, oriented to person, place, and time  Skin: Warm & dry  Cardiovascular: Normal heart rate noted  Respiratory: Normal respiratory effort, no distress  Abdomen: Soft, gravid, nontender  Pelvic: Cervical exam deferred, ~1cm raised double lobed condyloma         Extremities: Edema: Trace  Fetal Status: Fetal Heart Rate (bpm): 134 Fundal Height: 32 cm Movement: Present    Results for orders placed or performed in visit on 01/09/19 (from the past 24 hour(s))  POC Urinalysis Dipstick OB   Collection Time: 01/09/19  9:01 AM  Result Value Ref Range   Color, UA     Clarity, UA     Glucose, UA Negative Negative   Bilirubin, UA     Ketones, UA neg    Spec Grav, UA     Blood, UA neg    pH, UA     POC,PROTEIN,UA Negative Negative,  Trace, Small (1+), Moderate (2+), Large (3+), 4+   Urobilinogen, UA     Nitrite, UA neg    Leukocytes, UA Negative Negative   Appearance     Odor      Assessment & Plan:  1) Low-risk pregnancy G2P1001 at [redacted]w[redacted]d with an Estimated Date of Delivery: 03/19/19   2) 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 no tx, pt wants TCA   Meds:  Meds ordered this encounter  Medications  . trichloroacetic acid 80 % LIQD    Sig: Dispense for in office use    Dispense:  15 mL    Refill:  0    Order Specific Question:   Supervising Provider    Answer:   Florian Buff [[id number]]   Labs/procedures today: none  Plan:  Continue routine obstetrical care   Reviewed: Preterm labor symptoms and general obstetric precautions including but not limited to vaginal bleeding, contractions, leaking of fluid and fetal movement were reviewed in detail with the patient.  All questions were answered. Wants in person visits  Follow-up: Return in about 2 weeks (around 01/23/2019) for [place], in person w/ TCA tx.  Orders Placed This Encounter  Procedures  . POC Urinalysis Dipstick OB   Roma Schanz CNM, [facility] 01/09/2019 9:32 AM

## 2019-01-23 ENCOUNTER — Other Ambulatory Visit: Payer: Self-pay

## 2019-01-23 ENCOUNTER — Encounter: Payer: Self-pay | Admitting: Family Medicine

## 2019-01-23 ENCOUNTER — Ambulatory Visit (INDEPENDENT_AMBULATORY_CARE_PROVIDER_SITE_OTHER): Payer: Medicaid Other | Admitting: Family Medicine

## 2019-01-23 VITALS — BP 118/76 | HR 95 | Wt 281.4 lb

## 2019-01-23 DIAGNOSIS — Z1389 Encounter for screening for other disorder: Secondary | ICD-10-CM

## 2019-01-23 DIAGNOSIS — O99343 Other mental disorders complicating pregnancy, third trimester: Secondary | ICD-10-CM

## 2019-01-23 DIAGNOSIS — O99333 Smoking (tobacco) complicating pregnancy, third trimester: Secondary | ICD-10-CM

## 2019-01-23 DIAGNOSIS — O99210 Obesity complicating pregnancy, unspecified trimester: Secondary | ICD-10-CM

## 2019-01-23 DIAGNOSIS — Z3483 Encounter for supervision of other normal pregnancy, third trimester: Secondary | ICD-10-CM

## 2019-01-23 DIAGNOSIS — F321 Major depressive disorder, single episode, moderate: Secondary | ICD-10-CM

## 2019-01-23 DIAGNOSIS — Z3A32 32 weeks gestation of pregnancy: Secondary | ICD-10-CM

## 2019-01-23 DIAGNOSIS — Z331 Pregnant state, incidental: Secondary | ICD-10-CM

## 2019-01-23 LAB — POCT URINALYSIS DIPSTICK OB
Blood, UA: NEGATIVE
Glucose, UA: NEGATIVE
Ketones, UA: NEGATIVE
Leukocytes, UA: NEGATIVE
Nitrite, UA: NEGATIVE
POC,PROTEIN,UA: NEGATIVE

## 2019-01-23 NOTE — Progress Notes (Signed)
    PRENATAL VISIT NOTE  Subjective:  Taylor [name] is a 28 y.o. G2P1001 at [redacted]w[redacted]d being seen today for ongoing prenatal care.  She is currently monitored for the following issues for this low-risk pregnancy and has GERD (gastroesophageal reflux disease); Current smoker; Depression, major, single episode, moderate ([place]); GAD (generalized anxiety disorder); Supervision of normal pregnancy; Bicuspid aortic valve; H/O Abnormal LFTs; Tobacco use affecting pregnancy in third trimester, antepartum; Obesity affecting pregnancy, antepartum; and Morbid obesity ([place]) on their problem list.  Patient reports no complaints.  Contractions: Irregular. Vag. Bleeding: None.  Movement: Present. Denies leaking of fluid.   The following portions of the patient's history were reviewed and updated as appropriate: allergies, current medications, past family history, past medical history, past social history, past surgical history and problem list. Problem list updated.  Objective:   Vitals:   01/23/19 0926  BP: 118/76  Pulse: 95  Weight: 281 lb 6.4 oz (127.6 kg)    Fetal Status:     Movement: Present     General:  Alert, oriented and cooperative. Patient is in no acute distress.  Skin: Skin is warm and dry. No rash noted.   Cardiovascular: Normal heart rate noted  Respiratory: Normal respiratory effort, no problems with respiration noted  Abdomen: Soft, gravid, appropriate for gestational age.  Pain/Pressure: Present     Pelvic: Cervical exam deferred        Extremities: Normal range of motion.  Edema: Trace  Mental Status: Normal mood and affect. Normal behavior. Normal judgment and thought content.   Assessment and Plan:  Pregnancy: G2P1001 at [redacted]w[redacted]d  1. Encounter for supervision of other normal pregnancy in third trimester Up to date Did not bring TCA for treatment today- Discussed option for in person visit in 2 wk for administration vs interim appt for administration.   2. Screening for  genitourinary condition - POC Urinalysis Dipstick OB  3. Pregnant state, incidental - POC Urinalysis Dipstick OB  4. Obesity aff pregnancy  TWG=28 lb 6.4 oz (12.9 kg) which is above goal based on weight.   Preterm labor symptoms and general obstetric precautions including but not limited to vaginal bleeding, contractions, leaking of fluid and fetal movement were reviewed in detail with the patient. Please refer to After Visit Summary for other counseling recommendations.   Return in about 2 weeks (around 02/06/2019) for Routine prenatal care, Telehealth/Virtual health OB Visit.  Future Appointments  Date Time Provider [place]  02/02/2019  3:10 PM Sharion Balloon, FNP [facility] None  02/06/2019 10:50 AM Roma Schanz, CNM [facility]    Caren Macadam, MD

## 2019-02-02 ENCOUNTER — Encounter: Payer: Self-pay | Admitting: Family

## 2019-02-02 ENCOUNTER — Ambulatory Visit (INDEPENDENT_AMBULATORY_CARE_PROVIDER_SITE_OTHER): Payer: Medicaid Other | Admitting: Family

## 2019-02-02 DIAGNOSIS — F321 Major depressive disorder, single episode, moderate: Secondary | ICD-10-CM | POA: Diagnosis not present

## 2019-02-02 DIAGNOSIS — K219 Gastro-esophageal reflux disease without esophagitis: Secondary | ICD-10-CM | POA: Diagnosis not present

## 2019-02-02 DIAGNOSIS — F411 Generalized anxiety disorder: Secondary | ICD-10-CM | POA: Diagnosis not present

## 2019-02-02 DIAGNOSIS — F17200 Nicotine dependence, unspecified, uncomplicated: Secondary | ICD-10-CM

## 2019-02-02 MED ORDER — OMEPRAZOLE 20 MG PO CPDR: 20.0000 mg | DELAYED_RELEASE_CAPSULE | Freq: Every day | ORAL | 3 refills | Status: DC

## 2019-02-02 MED ORDER — CITALOPRAM HYDROBROMIDE 40 MG PO TABS: 40.0000 mg | ORAL_TABLET | Freq: Every day | ORAL | 4 refills | Status: DC

## 2019-02-02 NOTE — Progress Notes (Signed)
Virtual Visit via telephone Note Due to COVID-19 pandemic this visit was conducted virtually. This visit type was conducted due to national recommendations for restrictions regarding the COVID-19 Pandemic (e.g. social distancing, sheltering in place) in an effort to limit this patient's exposure and mitigate transmission in our community. All issues noted in this document were discussed and addressed.  A physical exam was not performed with this format.  I connected with Taylor [name] on 02/02/19 at 2:20 pm by telephone and verified that I am speaking with the correct person using two identifiers. Taylor [name] is currently located at home and daughter is currently with her during visit. The provider, Evelina Dun, FNP is located in their office at time of visit.  I discussed the limitations, risks, security and privacy concerns of performing an evaluation and management service by telephone and the availability of in person appointments. I also discussed with the patient that there may be a patient responsible charge related to this service. The patient expressed understanding and agreed to proceed.   History and Present Illness:  Pt calls the office today for chronic follow up. She is currently [redacted] weeks pregnant and states she is doing well. She saw a Pulmonologist for a sleep study and it was negative for OSA.   Gastroesophageal Reflux She complains of heartburn. She reports no belching. This is a chronic problem. The current episode started more than 1 year ago. The problem occurs occasionally. The problem has been waxing and waning. The symptoms are aggravated by medications. She has tried a PPI for the symptoms. The treatment provided moderate relief.  Depression        This is a chronic problem.  The current episode started more than 1 year ago.   The onset quality is gradual.   The problem occurs intermittently.  Associated symptoms include decreased concentration, irritable,  restlessness, decreased interest and sad.  Associated symptoms include no helplessness and no hopelessness.  Past treatments include SSRIs - Selective serotonin reuptake inhibitors.  Past medical history includes anxiety.   Anxiety Presents for follow-up visit. Symptoms include decreased concentration, depressed mood, excessive worry, irritability, panic and restlessness.    Nicotine Dependence Presents for follow-up visit. Symptoms include decreased concentration and irritability. Her urge triggers include company of smokers. She smokes < 1/2 a pack (5-10) of cigarettes per day.      Review of Systems  Constitutional: Positive for irritability.  Gastrointestinal: Positive for heartburn.  Psychiatric/Behavioral: Positive for decreased concentration and depression.  All other systems reviewed and are negative.    Observations/Objective: No SOB or distress noted   Assessment and Plan: 1. Gastroesophageal reflux disease, esophagitis presence not specified -Diet discussed- Avoid fried, spicy, citrus foods, caffeine and alcohol -Do not eat 2-3 hours before bedtime -Encouraged small frequent meals -Avoid NSAID's - omeprazole (PRILOSEC) 20 MG capsule; Take 1 capsule (20 mg total) by mouth daily.  Dispense: 90 capsule; Refill: 3  2. GAD (generalized anxiety disorder) - citalopram (CELEXA) 40 MG tablet; Take 1 tablet (40 mg total) by mouth daily.  Dispense: 90 tablet; Refill: 4  3. Depression, major, single episode, moderate ([facility]) - citalopram (CELEXA) 40 MG tablet; Take 1 tablet (40 mg total) by mouth daily.  Dispense: 90 tablet; Refill: 4  4. Morbid obesity ([place])  5. Current smoker Smoking cessation discussed  Keep all follow up with GYN Call if needed RTO in 1 year      I discussed the assessment and treatment plan with the patient. The  patient was provided an opportunity to ask questions and all were answered. The patient agreed with the plan and demonstrated an  understanding of the instructions.   The patient was advised to call back or seek an in-person evaluation if the symptoms worsen or if the condition fails to improve as anticipated.  The above assessment and management plan was discussed with the patient. The patient verbalized understanding of and has agreed to the management plan. Patient is aware to call the clinic if symptoms persist or worsen. Patient is aware when to return to the clinic for a follow-up visit. Patient educated on when it is appropriate to go to the emergency department.   Time call ended:  2:33 pm  I provided 13 minutes of non-face-to-face time during this encounter.    Evelina Dun, FNP

## 2019-02-06 ENCOUNTER — Other Ambulatory Visit: Payer: Self-pay

## 2019-02-06 ENCOUNTER — Ambulatory Visit (INDEPENDENT_AMBULATORY_CARE_PROVIDER_SITE_OTHER): Payer: Medicaid Other | Admitting: Women's Health

## 2019-02-06 ENCOUNTER — Encounter: Payer: Self-pay | Admitting: Women's Health

## 2019-02-06 VITALS — BP 124/74 | HR 95 | Wt 283.5 lb

## 2019-02-06 DIAGNOSIS — Z3A34 34 weeks gestation of pregnancy: Secondary | ICD-10-CM

## 2019-02-06 DIAGNOSIS — O2243 Hemorrhoids in pregnancy, third trimester: Secondary | ICD-10-CM

## 2019-02-06 DIAGNOSIS — Z331 Pregnant state, incidental: Secondary | ICD-10-CM

## 2019-02-06 DIAGNOSIS — Z1389 Encounter for screening for other disorder: Secondary | ICD-10-CM

## 2019-02-06 DIAGNOSIS — Z3483 Encounter for supervision of other normal pregnancy, third trimester: Secondary | ICD-10-CM

## 2019-02-06 DIAGNOSIS — L0292 Furuncle, unspecified: Secondary | ICD-10-CM

## 2019-02-06 LAB — POCT URINALYSIS DIPSTICK OB
Blood, UA: NEGATIVE
Glucose, UA: NEGATIVE
Ketones, UA: NEGATIVE
Leukocytes, UA: NEGATIVE
Nitrite, UA: NEGATIVE
POC,PROTEIN,UA: NEGATIVE

## 2019-02-06 MED ORDER — SULFAMETHOXAZOLE-TRIMETHOPRIM 800-160 MG PO TABS: 1.0000 | ORAL_TABLET | Freq: Two times a day (BID) | ORAL | 0 refills | Status: DC

## 2019-02-06 NOTE — Progress Notes (Signed)
LOW-RISK PREGNANCY VISIT Patient name: Taylor [name] MRN [id number]  Date of birth: 1991/01/10 Chief Complaint:   Routine Prenatal Visit (+ pressure; + hemorrhoids; TCA treatment; ? infected hair follicle )  History of Present Illness:   Taylor [name] is a 28 y.o. G59P1001 female at [redacted]w[redacted]d with an Estimated Date of Delivery: 03/19/19 being seen today for ongoing management of a low-risk pregnancy.  Today she reports a lot of pressure, came out of work last Sun 01/29/2019 d/t high demands of job, not honoring her 6hr note we gave, increased contractions/pressure while at [employer] note. Hemorrhoids, hasn't tried anything. Some constipation. Shaves pubic area, front of pubis hits toilet seat when getting up from toilet, looks infected to her. Brought TCA today for tx.  Contractions: Irregular. Vag. Bleeding: None.  Movement: Present. denies leaking of fluid. Review of Systems:   Pertinent items are noted in HPI Denies abnormal vaginal discharge w/ itching/odor/irritation, headaches, visual changes, shortness of breath, chest pain, abdominal pain, severe nausea/vomiting, or problems with urination or bowel movements unless otherwise stated above. Pertinent History Reviewed:  Reviewed past medical,surgical, social, obstetrical and family history.  Reviewed problem list, medications and allergies. Physical Assessment:   Vitals:   02/06/19 1058  BP: 124/74  Pulse: 95  Weight: 283 lb 8 oz (128.6 kg)  Body mass index is 48.66 kg/m.        Physical Examination:   General appearance: Well appearing, and in no distress  Mental status: Alert, oriented to person, place, and time  Skin: Warm & dry  Cardiovascular: Normal heart rate noted  Respiratory: Normal respiratory effort, no distress  Abdomen: Soft, gravid, nontender  Pelvic: spece exam: cx visually closed, SVE: LTC, Rt mons ~1.5cm erythematous folliculitis vs boil, no head currently. Right beside condyloma, will hold off on TCA until  resolved  Rectal: multiple non-thrombosed hemorrhoids         Extremities: Edema: Trace  Fetal Status:     Movement: Present    Results for orders placed or performed in visit on 02/06/19 (from the past 24 hour(s))  POC Urinalysis Dipstick OB   Collection Time: 02/06/19 10:59 AM  Result Value Ref Range   Color, UA     Clarity, UA     Glucose, UA Negative Negative   Bilirubin, UA     Ketones, UA neg    Spec Grav, UA     Blood, UA neg    pH, UA     POC,PROTEIN,UA Negative Negative, Trace, Small (1+), Moderate (2+), Large (3+), 4+   Urobilinogen, UA     Nitrite, UA neg    Leukocytes, UA Negative Negative   Appearance     Odor      Assessment & Plan:  1) Low-risk pregnancy G2P1001 at [redacted]w[redacted]d with an Estimated Date of Delivery: 03/19/19   2) Boil vs folliculitis on mons, rx septra, warm compresses, cover w/ bandaid when has to use toilet to prevent it from hitting/being exposed to toilet seat. Let us know if not improving/worsening  3) Hemorrhoids w/ constipation> gave printed prevention/relief measures, gave TUCKS pads sample, to use preparation H  4) Pressure> no cervical change  5) Vulvar condyloma> will hold TCA tx until pp    Meds:  Meds ordered this encounter  Medications  . sulfamethoxazole-trimethoprim (BACTRIM DS) 800-160 MG tablet    Sig: Take 1 tablet by mouth 2 (two) times daily. X 7 days    Dispense:  14 tablet    Refill:  0  Order Specific Question:   Supervising Provider    Answer:   Florian Buff [[id number]]   Labs/procedures today: spec exam, SVE  Plan:  Continue routine obstetrical care   Reviewed: Preterm labor symptoms and general obstetric precautions including but not limited to vaginal bleeding, contractions, leaking of fluid and fetal movement were reviewed in detail with the patient.  All questions were answered.   Follow-up: Return in about 2 weeks (around 02/20/2019) for [place], in person.  Orders Placed This Encounter  Procedures  . POC  Urinalysis Dipstick OB   Roma Schanz CNM, [facility] 02/06/2019 1:41 PM

## 2019-02-06 NOTE — Patient Instructions (Addendum)
Taylor [name], I greatly value your feedback.  If you receive a survey following your visit with [place] today, we appreciate you taking the time to fill it out.  Thanks, [name], CNM, [facility]  [place]!!! It is now [place] [facility] ([place], [place] [address]) Entrance located off of [address] parking   Go to [email or web address] to register for FREE online childbirth classes    Call the office [phone]) or go to [facility] if:  You begin to have strong, frequent contractions  Your water breaks.  Sometimes it is a big gush of fluid, sometimes it is just a trickle that keeps getting your panties wet or running down your legs  You have vaginal bleeding.  It is normal to have a small amount of spotting if your cervix was checked.   You don't feel your baby moving like normal.  If you don't, get you something to eat and drink and lay down and focus on feeling your baby move.  You should feel at least 10 movements in 2 hours.  If you don't, you should call the office or go to [facility].   Constipation  Drink plenty of fluid, preferably water, throughout the day  Eat foods high in fiber such as fruits, vegetables, and grains  Exercise, such as walking, is a good way to keep your bowels regular  Drink warm fluids, especially warm prune juice, or decaf coffee  Eat a 1/2 cup of real oatmeal (not instant), 1/2 cup applesauce, and 1/2-1 cup warm prune juice every day  If needed, you may take Colace (docusate sodium) stool softener once or twice a day to help keep the stool soft. If you are pregnant, wait until you are out of your first trimester (12-14 weeks of pregnancy)  If you still are having problems with constipation, you may take Miralax once daily as needed to help keep your bowels regular.  If you are pregnant, wait until you are out of your first trimester (12-14 weeks of pregnancy)      Home Blood Pressure Monitoring for Patients   Your provider has recommended that you check your blood pressure (BP) at least once a week at home. If you do not have a blood pressure cuff at home, one will be provided for you. Contact your provider if you have not received your monitor within 1 week.   Helpful Tips for Accurate Home Blood Pressure Checks  . Don't smoke, exercise, or drink caffeine 30 minutes before checking your BP . Use the restroom before checking your BP (a full bladder can raise your pressure) . Relax in a comfortable upright chair . Feet on the ground . Left arm resting comfortably on a flat surface at the level of your heart . Legs uncrossed . Back supported . Sit quietly and don't talk . Place the cuff on your bare arm . Adjust snuggly, so that only two fingertips can fit between your skin and the top of the cuff . Check 2 readings separated by at least one minute . Keep a log of your BP readings . For a visual, please reference this diagram: http://ccnc.care/bpdiagram  Provider Name: [facility]     Phone: [phone]  Zone 1: ALL CLEAR  Continue to monitor your symptoms:  . BP reading is less than 140 (top number) or less than 90 (bottom number)  . No right upper stomach pain . No  headaches or seeing spots . No feeling nauseated or throwing up . No swelling in face and hands  Zone 2: CAUTION Call your doctor's office for any of the following:  . BP reading is greater than 140 (top number) or greater than 90 (bottom number)  . Stomach pain under your ribs in the middle or right side . Headaches or seeing spots . Feeling nauseated or throwing up . Swelling in face and hands  Zone 3: EMERGENCY  Seek immediate medical care if you have any of the following:  . BP reading is greater than160 (top number) or greater than 110 (bottom number) . Severe headaches not improving with Tylenol . Serious difficulty catching your breath . Any worsening  symptoms from Zone 2    Preterm Labor and Birth Information  The normal length of a pregnancy is 39-41 weeks. Preterm labor is when labor starts before 37 completed weeks of pregnancy. What are the risk factors for preterm labor? Preterm labor is more likely to occur in women who:  Have certain infections during pregnancy such as a bladder infection, sexually transmitted infection, or infection inside the uterus (chorioamnionitis).  Have a shorter-than-normal cervix.  Have gone into preterm labor before.  Have had surgery on their cervix.  Are younger than age 65 or older than age 66.  Are African American.  Are pregnant with twins or multiple babies (multiple gestation).  Take street drugs or smoke while pregnant.  Do not gain enough weight while pregnant.  Became pregnant shortly after having been pregnant. What are the symptoms of preterm labor? Symptoms of preterm labor include:  Cramps similar to those that can happen during a menstrual period. The cramps may happen with diarrhea.  Pain in the abdomen or lower back.  Regular uterine contractions that may feel like tightening of the abdomen.  A feeling of increased pressure in the pelvis.  Increased watery or bloody mucus discharge from the vagina.  Water breaking (ruptured amniotic sac). Why is it important to recognize signs of preterm labor? It is important to recognize signs of preterm labor because babies who are born prematurely may not be fully developed. This can put them at an increased risk for:  Long-term (chronic) heart and lung problems.  Difficulty immediately after birth with regulating body systems, including blood sugar, body temperature, heart rate, and breathing rate.  Bleeding in the brain.  Cerebral palsy.  Learning difficulties.  Death. These risks are highest for babies who are born before 83 weeks of pregnancy. How is preterm labor treated? Treatment depends on the length of your  pregnancy, your condition, and the health of your baby. It may involve:  Having a stitch (suture) placed in your cervix to prevent your cervix from opening too early (cerclage).  Taking or being given medicines, such as: ? Hormone medicines. These may be given early in pregnancy to help support the pregnancy. ? Medicine to stop contractions. ? Medicines to help mature the baby's lungs. These may be prescribed if the risk of delivery is high. ? Medicines to prevent your baby from developing cerebral palsy. If the labor happens before 34 weeks of pregnancy, you may need to stay in the hospital. What should I do if I think I am in preterm labor? If you think that you are going into preterm labor, call your health care provider right away. How can I prevent preterm labor in future pregnancies? To increase your chance of having a full-term pregnancy:  Do not use  any tobacco products, such as cigarettes, chewing tobacco, and e-cigarettes. If you need help quitting, ask your health care provider.  Do not use street drugs or medicines that have not been prescribed to you during your pregnancy.  Talk with your health care provider before taking any herbal supplements, even if you have been taking them regularly.  Make sure you gain a healthy amount of weight during your pregnancy.  Watch for infection. If you think that you might have an infection, get it checked right away.  Make sure to tell your health care provider if you have gone into preterm labor before. This information is not intended to replace advice given to you by your health care provider. Make sure you discuss any questions you have with your health care provider. Document Released: 07/18/2003 Document Revised: 08/19/2018 Document Reviewed: 09/18/2015 Elsevier Patient Education  2020 [employer].

## 2019-02-19 ENCOUNTER — Encounter: Payer: Self-pay | Admitting: *Deleted

## 2019-02-20 ENCOUNTER — Encounter: Payer: Self-pay | Admitting: Advanced Practice Midwife

## 2019-02-20 ENCOUNTER — Other Ambulatory Visit: Payer: Self-pay

## 2019-02-20 ENCOUNTER — Encounter: Payer: Medicaid Other | Admitting: Women's Health

## 2019-02-20 ENCOUNTER — Ambulatory Visit (INDEPENDENT_AMBULATORY_CARE_PROVIDER_SITE_OTHER): Payer: Medicaid Other | Admitting: Advanced Practice Midwife

## 2019-02-20 VITALS — BP 116/73 | HR 103 | Wt 288.0 lb

## 2019-02-20 DIAGNOSIS — Z331 Pregnant state, incidental: Secondary | ICD-10-CM

## 2019-02-20 DIAGNOSIS — Z3483 Encounter for supervision of other normal pregnancy, third trimester: Secondary | ICD-10-CM

## 2019-02-20 DIAGNOSIS — Z3A36 36 weeks gestation of pregnancy: Secondary | ICD-10-CM

## 2019-02-20 DIAGNOSIS — Z23 Encounter for immunization: Secondary | ICD-10-CM

## 2019-02-20 DIAGNOSIS — Z1389 Encounter for screening for other disorder: Secondary | ICD-10-CM

## 2019-02-20 LAB — POCT URINALYSIS DIPSTICK OB
Blood, UA: NEGATIVE
Glucose, UA: NEGATIVE
Ketones, UA: NEGATIVE
Leukocytes, UA: NEGATIVE
Nitrite, UA: NEGATIVE

## 2019-02-20 NOTE — Patient Instructions (Signed)
AM I IN LABOR? What is labor? Labor is the work that your body does to birth your baby. Your uterus (the womb) contracts. Your cervix (the mouth of the uterus) opens. You will push your baby out into the world.  What do contractions (labor pains) feel like? When they first start, contractions usually feel like cramps during your period. Sometimes you feel pain in your back. Most often, contractions feel like muscles pulling painfully in your lower belly. At first, the contractions will probably be 15 to 20 minutes apart. They will not feel too painful. As labor goes on, the contractions get stronger, closer together, and more painful.  How do I time the contractions? Time your contractions by counting the number of minutes from the start of one contraction to the start of the next contraction.  What should I do when the contractions start? If it is night and you can sleep, sleep. If it happens during the day, here are some things you can do to take care of yourself at home: ? Walk. If the pains you are having are real labor, walking will make the contractions come faster and harder. If the contractions are not going to continue and be real labor, walking will make the contractions slow down. ? Take a shower or bath. This will help you relax. ? Eat. Labor is a big event. It takes a lot of energy. ? Drink water. Not drinking enough water can cause false labor (contractions that hurt but do not open your cervix). If this is true labor, drinking water will help you have strength to get through your labor. ? Take a nap. Get all the rest you can. ? Get a massage. If your labor is in your back, a strong massage on your lower back may feel very good. Getting a foot massage is always good. ? Don't panic. You can do this. Your body was made for this. You are strong!  When should I go to the hospital or call my health care provider? ? Your contractions have been 5 minutes apart or less for at least 1  hour. ? If several contractions are so painful you cannot walk or talk during one. ? Your bag of waters breaks. (You may have a big gush of water or just water that runs down your legs when you walk.)  Are there other reasons to call my health care provider? Yes, you should call your health care provider or go to the hospital if you start to bleed like you are having a period- blood that soaks your underwear or runs down your legs, if you have sudden severe pain, if your baby has not moved for several hours, or if you are leaking green fluid. The rule is as follows: If you are very concerned about something, call.  
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
## 2019-02-22 LAB — GC/CHLAMYDIA PROBE AMP
Chlamydia trachomatis, NAA: NEGATIVE
Neisseria Gonorrhoeae by PCR: NEGATIVE

## 2019-02-22 LAB — STREP GP B NAA: Strep Gp B NAA: NEGATIVE

## 2019-02-25 ENCOUNTER — Other Ambulatory Visit: Payer: Self-pay

## 2019-02-25 ENCOUNTER — Inpatient Hospital Stay (HOSPITAL_COMMUNITY)
Admission: AD | Admit: 2019-02-25 | Discharge: 2019-02-25 | Disposition: A | Discharge status: Home / Self Care | Payer: Medicaid Other | Attending: Obstetrics & Gynecology | Admitting: Obstetrics & Gynecology

## 2019-02-25 ENCOUNTER — Encounter (HOSPITAL_COMMUNITY): Payer: Self-pay | Admitting: *Deleted

## 2019-02-25 DIAGNOSIS — W19XXXA Unspecified fall, initial encounter: Secondary | ICD-10-CM

## 2019-02-25 DIAGNOSIS — F329 Major depressive disorder, single episode, unspecified: Secondary | ICD-10-CM | POA: Insufficient documentation

## 2019-02-25 DIAGNOSIS — O99333 Smoking (tobacco) complicating pregnancy, third trimester: Secondary | ICD-10-CM | POA: Insufficient documentation

## 2019-02-25 DIAGNOSIS — Z79899 Other long term (current) drug therapy: Secondary | ICD-10-CM | POA: Diagnosis not present

## 2019-02-25 DIAGNOSIS — Y92009 Unspecified place in unspecified non-institutional (private) residence as the place of occurrence of the external cause: Secondary | ICD-10-CM | POA: Insufficient documentation

## 2019-02-25 DIAGNOSIS — Z3689 Encounter for other specified antenatal screening: Secondary | ICD-10-CM

## 2019-02-25 DIAGNOSIS — O9A213 Injury, poisoning and certain other consequences of external causes complicating pregnancy, third trimester: Secondary | ICD-10-CM

## 2019-02-25 DIAGNOSIS — O99343 Other mental disorders complicating pregnancy, third trimester: Secondary | ICD-10-CM | POA: Insufficient documentation

## 2019-02-25 DIAGNOSIS — W109XXA Fall (on) (from) unspecified stairs and steps, initial encounter: Secondary | ICD-10-CM | POA: Diagnosis not present

## 2019-02-25 DIAGNOSIS — F17210 Nicotine dependence, cigarettes, uncomplicated: Secondary | ICD-10-CM | POA: Diagnosis not present

## 2019-02-25 DIAGNOSIS — Z3A36 36 weeks gestation of pregnancy: Secondary | ICD-10-CM | POA: Insufficient documentation

## 2019-02-25 LAB — URINALYSIS, ROUTINE W REFLEX MICROSCOPIC
Bilirubin Urine: NEGATIVE
Glucose, UA: NEGATIVE mg/dL
Hgb urine dipstick: NEGATIVE
Ketones, ur: NEGATIVE mg/dL
Leukocytes,Ua: NEGATIVE
Nitrite: NEGATIVE
Protein, ur: NEGATIVE mg/dL
Specific Gravity, Urine: 1.005 (ref 1.005–1.030)
pH: 7 (ref 5.0–8.0)

## 2019-02-25 NOTE — Discharge Instructions (Signed)
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
## 2019-02-25 NOTE — Progress Notes (Signed)
Patient reports good fetal movement.

## 2019-02-25 NOTE — MAU Note (Signed)
Taylor [name] is a 28 y.o. at [redacted]w[redacted]d here in MAU reporting: states she fell down her steps about 30 min ago and landed directly on her abdomen. Has had 1 contraction since the fall. Has maybe felt a flutter of movement but not much movement. Has not felt any leaking of fluid or blood that she is aware of.   Onset of complaint: today  Pain score: 0/10  Vitals:   02/25/19 1749  BP: 134/74  Pulse: (!) 103  Resp: 18  Temp: 97.9 F (36.6 C)  SpO2: 98%     FHT: 138  Lab orders placed from triage: UA

## 2019-02-28 ENCOUNTER — Ambulatory Visit (INDEPENDENT_AMBULATORY_CARE_PROVIDER_SITE_OTHER)
Admission: RE | Admit: 2019-02-28 | Discharge: 2019-02-28 | Disposition: A | Discharge status: Home / Self Care | Payer: Medicaid Other | Source: Ambulatory Visit

## 2019-02-28 ENCOUNTER — Telehealth: Payer: Self-pay | Admitting: Family

## 2019-02-28 ENCOUNTER — Other Ambulatory Visit: Payer: Self-pay | Admitting: *Deleted

## 2019-02-28 DIAGNOSIS — Z20828 Contact with and (suspected) exposure to other viral communicable diseases: Secondary | ICD-10-CM

## 2019-02-28 DIAGNOSIS — Z20822 Contact with and (suspected) exposure to covid-19: Secondary | ICD-10-CM

## 2019-02-28 DIAGNOSIS — J069 Acute upper respiratory infection, unspecified: Secondary | ICD-10-CM

## 2019-02-28 MED ORDER — FLUTICASONE PROPIONATE 50 MCG/ACT NA SUSP: 2.0000 | Freq: Every day | NASAL | 0 refills | Status: DC

## 2019-02-28 MED ORDER — CETIRIZINE HCL 10 MG PO CHEW: 10.0000 mg | CHEWABLE_TABLET | Freq: Every day | ORAL | 0 refills | Status: DC

## 2019-02-28 NOTE — Discharge Instructions (Signed)
COVID testing ordered.  Go to drive-thru testing center at [facility] ED.    In the meantime: You should remain isolated in your home for 7 days from symptom onset AND greater than 72 hours after symptoms resolution (absence of fever without the use of fever-reducing medication and improvement in respiratory symptoms), whichever is longer Get plenty of rest and push fluids Zyrtec prescribed for nasal congestion, runny nose, and/or sore throat Flonase prescribed for nasal congestion and runny nose Use medications daily for symptom relief Use OTC medications like ibuprofen or tylenol as needed fever or pain Follow up with OB/ PCP this week for recheck and to ensure symptoms are improving Follow up in person, call or go to the ED if you have any new or worsening symptoms such as fever, worsening cough, shortness of breath, chest tightness, chest pain, turning blue, changes in mental status, etc..Marland Kitchen
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
## 2019-02-28 NOTE — Telephone Encounter (Signed)
urgent care video visit today

## 2019-03-01 ENCOUNTER — Other Ambulatory Visit: Payer: Medicaid Other

## 2019-03-01 ENCOUNTER — Encounter: Payer: Medicaid Other | Admitting: Obstetrics and Gynecology

## 2019-03-01 LAB — NOVEL CORONAVIRUS, NAA: SARS-CoV-2, NAA: NOT DETECTED

## 2019-03-01 LAB — SPECIMEN STATUS REPORT

## 2019-03-08 ENCOUNTER — Encounter: Payer: Self-pay | Admitting: Advanced Practice Midwife

## 2019-03-08 ENCOUNTER — Other Ambulatory Visit: Payer: Self-pay

## 2019-03-08 ENCOUNTER — Ambulatory Visit (INDEPENDENT_AMBULATORY_CARE_PROVIDER_SITE_OTHER): Payer: Medicaid Other | Admitting: Advanced Practice Midwife

## 2019-03-08 VITALS — BP 119/75 | HR 106 | Wt 288.5 lb

## 2019-03-08 DIAGNOSIS — Z3483 Encounter for supervision of other normal pregnancy, third trimester: Secondary | ICD-10-CM

## 2019-03-08 DIAGNOSIS — Z1389 Encounter for screening for other disorder: Secondary | ICD-10-CM

## 2019-03-08 DIAGNOSIS — K219 Gastro-esophageal reflux disease without esophagitis: Secondary | ICD-10-CM

## 2019-03-08 DIAGNOSIS — Z331 Pregnant state, incidental: Secondary | ICD-10-CM

## 2019-03-08 DIAGNOSIS — F321 Major depressive disorder, single episode, moderate: Secondary | ICD-10-CM

## 2019-03-08 DIAGNOSIS — Z3A38 38 weeks gestation of pregnancy: Secondary | ICD-10-CM

## 2019-03-08 LAB — POCT URINALYSIS DIPSTICK OB
Blood, UA: NEGATIVE
Glucose, UA: NEGATIVE
Ketones, UA: NEGATIVE
Leukocytes, UA: NEGATIVE
Nitrite, UA: NEGATIVE

## 2019-03-08 NOTE — Patient Instructions (Signed)
Taylor [name], I greatly value your feedback.  If you receive a survey following your visit with [place] today, we appreciate you taking the time to fill it out.  Thanks, Derrill Memo, [place]!!! It is now [facility] at [facility] ([place], [place] [address]) Entrance located off of [place] parking    Go to [email or web address] to register for FREE online childbirth classes   Call the office [phone]) or go to [facility] if:  You begin to have strong, frequent contractions  Your water breaks.  Sometimes it is a big gush of fluid, sometimes it is just a trickle that keeps getting your panties wet or running down your legs  You have vaginal bleeding.  It is normal to have a small amount of spotting if your cervix was checked.   You don't feel your baby moving like normal.  If you don't, get you something to eat and drink and lay down and focus on feeling your baby move.  You should feel at least 10 movements in 2 hours.  If you don't, you should call the office or go to [facility].    Tdap Vaccine  It is recommended that you get the Tdap vaccine during the third trimester of EACH pregnancy to help protect your baby from getting pertussis (whooping cough)  27-36 weeks is the BEST time to do this so that you can pass the protection on to your baby. During pregnancy is better than after pregnancy, but if you are unable to get it during pregnancy it will be offered at the hospital.   You can get this vaccine with [place], at the health department, your family doctor, or some local pharmacies  Everyone who will be around your baby should also be up-to-date on their vaccines before the baby comes. Adults (who are not pregnant) only need 1 dose of Tdap during adulthood.   [place] Pediatricians/Family Doctors:  [facility] [phone]                  [place] [phone] (usually not accepting new patients unless you have family there already, you are always welcome to call and ask)       [facility] Department [phone]       [facility] Pediatricians/Family Doctors:   [facility]: [phone]  [facility]: [phone]  [facility]: [place] Doctors:   [facility]: [facility]: [place]:  [place]: [phone]   Home Blood Pressure Monitoring for Patients   Your provider has recommended that you check your blood pressure (BP) at least once a week at home. If you do not have a blood pressure cuff at home, one will be provided for you. Contact your provider if you have not received your monitor within 1 week.   Helpful Tips for Accurate Home Blood Pressure Checks  . Don't smoke, exercise, or drink caffeine 30 minutes before checking your BP . Use the restroom before checking your BP (a full bladder can raise your pressure) . Relax in a comfortable upright chair . Feet on the ground . Left arm resting comfortably on a flat surface at the level of your heart . Legs uncrossed . Back supported . Sit quietly and don't talk .  Place the cuff on your bare arm . Adjust snuggly, so that only two fingertips can fit between your skin and the top of the cuff . Check 2 readings separated by at least one minute . Keep a log of your BP readings . For a visual, please reference this diagram: http://ccnc.care/bpdiagram  Provider Name: [facility]     Phone: [phone]  Zone 1: ALL CLEAR  Continue to monitor your symptoms:  . BP reading is less than 140 (top number) or less than 90 (bottom number)  . No right upper stomach pain . No headaches or seeing spots . No feeling nauseated or throwing up . No swelling in face and  hands  Zone 2: CAUTION Call your doctor's office for any of the following:  . BP reading is greater than 140 (top number) or greater than 90 (bottom number)  . Stomach pain under your ribs in the middle or right side . Headaches or seeing spots . Feeling nauseated or throwing up . Swelling in face and hands  Zone 3: EMERGENCY  Seek immediate medical care if you have any of the following:  . BP reading is greater than160 (top number) or greater than 110 (bottom number) . Severe headaches not improving with Tylenol . Serious difficulty catching your breath . Any worsening symptoms from Zone 2   Third Trimester of Pregnancy The third trimester is from week 29 through week 42, months 7 through 9. The third trimester is a time when the fetus is growing rapidly. At the end of the ninth month, the fetus is about 20 inches in length and weighs 6-10 pounds.  BODY CHANGES Your body goes through many changes during pregnancy. The changes vary from woman to woman.   Your weight will continue to increase. You can expect to gain 25-35 pounds (11-16 kg) by the end of the pregnancy.  You may begin to get stretch marks on your hips, abdomen, and breasts.  You may urinate more often because the fetus is moving lower into your pelvis and pressing on your bladder.  You may develop or continue to have heartburn as a result of your pregnancy.  You may develop constipation because certain hormones are causing the muscles that push waste through your intestines to slow down.  You may develop hemorrhoids or swollen, bulging veins (varicose veins).  You may have pelvic pain because of the weight gain and pregnancy hormones relaxing your joints between the bones in your pelvis. Backaches may result from overexertion of the muscles supporting your posture.  You may have changes in your hair. These can include thickening of your hair, rapid growth, and changes in texture. Some women also have hair loss during  or after pregnancy, or hair that feels dry or thin. Your hair will most likely return to normal after your baby is born.  Your breasts will continue to grow and be tender. A yellow discharge may leak from your breasts called colostrum.  Your belly button may stick out.  You may feel short of breath because of your expanding uterus.  You may notice the fetus "dropping," or moving lower in your abdomen.  You may have a bloody mucus discharge. This usually occurs a few days to a week before labor begins.  Your cervix becomes thin and soft (effaced) near your due date. WHAT TO EXPECT AT YOUR PRENATAL EXAMS  You will have prenatal exams every 2 weeks until week 36. Then, you will have weekly prenatal exams. During  a routine prenatal visit:  You will be weighed to make sure you and the fetus are growing normally.  Your blood pressure is taken.  Your abdomen will be measured to track your baby's growth.  The fetal heartbeat will be listened to.  Any test results from the previous visit will be discussed.  You may have a cervical check near your due date to see if you have effaced. At around 36 weeks, your caregiver will check your cervix. At the same time, your caregiver will also perform a test on the secretions of the vaginal tissue. This test is to determine if a type of bacteria, Group B streptococcus, is present. Your caregiver will explain this further. Your caregiver may ask you:  What your birth plan is.  How you are feeling.  If you are feeling the baby move.  If you have had any abnormal symptoms, such as leaking fluid, bleeding, severe headaches, or abdominal cramping.  If you have any questions. Other tests or screenings that may be performed during your third trimester include:  Blood tests that check for low iron levels (anemia).  Fetal testing to check the health, activity level, and growth of the fetus. Testing is done if you have certain medical conditions or if  there are problems during the pregnancy. FALSE LABOR You may feel small, irregular contractions that eventually go away. These are called Braxton Hicks contractions, or false labor. Contractions may last for hours, days, or even weeks before true labor sets in. If contractions come at regular intervals, intensify, or become painful, it is best to be seen by your caregiver.  SIGNS OF LABOR   Menstrual-like cramps.  Contractions that are 5 minutes apart or less.  Contractions that start on the top of the uterus and spread down to the lower abdomen and back.  A sense of increased pelvic pressure or back pain.  A watery or bloody mucus discharge that comes from the vagina. If you have any of these signs before the 37th week of pregnancy, call your caregiver right away. You need to go to the hospital to get checked immediately. HOME CARE INSTRUCTIONS   Avoid all smoking, herbs, alcohol, and unprescribed drugs. These chemicals affect the formation and growth of the baby.  Follow your caregiver's instructions regarding medicine use. There are medicines that are either safe or unsafe to take during pregnancy.  Exercise only as directed by your caregiver. Experiencing uterine cramps is a good sign to stop exercising.  Continue to eat regular, healthy meals.  Wear a good support bra for breast tenderness.  Do not use hot tubs, steam rooms, or saunas.  Wear your seat belt at all times when driving.  Avoid raw meat, uncooked cheese, cat litter boxes, and soil used by cats. These carry germs that can cause birth defects in the baby.  Take your prenatal vitamins.  Try taking a stool softener (if your caregiver approves) if you develop constipation. Eat more high-fiber foods, such as fresh vegetables or fruit and whole grains. Drink plenty of fluids to keep your urine clear or pale yellow.  Take warm sitz baths to soothe any pain or discomfort caused by hemorrhoids. Use hemorrhoid cream if your  caregiver approves.  If you develop varicose veins, wear support hose. Elevate your feet for 15 minutes, 3-4 times a day. Limit salt in your diet.  Avoid heavy lifting, wear low heal shoes, and practice good posture.  Rest a lot with your legs elevated if you  have leg cramps or low back pain.  Visit your dentist if you have not gone during your pregnancy. Use a soft toothbrush to brush your teeth and be gentle when you floss.  A sexual relationship may be continued unless your caregiver directs you otherwise.  Do not travel far distances unless it is absolutely necessary and only with the approval of your caregiver.  Take prenatal classes to understand, practice, and ask questions about the labor and delivery.  Make a trial run to the hospital.  Pack your hospital bag.  Prepare the baby's nursery.  Continue to go to all your prenatal visits as directed by your caregiver. SEEK MEDICAL CARE IF:  You are unsure if you are in labor or if your water has broken.  You have dizziness.  You have mild pelvic cramps, pelvic pressure, or nagging pain in your abdominal area.  You have persistent nausea, vomiting, or diarrhea.  You have a bad smelling vaginal discharge.  You have pain with urination. SEEK IMMEDIATE MEDICAL CARE IF:   You have a fever.  You are leaking fluid from your vagina.  You have spotting or bleeding from your vagina.  You have severe abdominal cramping or pain.  You have rapid weight loss or gain.  You have shortness of breath with chest pain.  You notice sudden or extreme swelling of your face, hands, ankles, feet, or legs.  You have not felt your baby move in over an hour.  You have severe headaches that do not go away with medicine.  You have vision changes. Document Released: 04/21/2001 Document Revised: 05/02/2013 Document Reviewed: 06/28/2012 [facility] Patient Information 2015 [place], [place]. This information is not intended to replace advice  given to you by your health care provider. Make sure you discuss any questions you have with your health care provider.

## 2019-03-08 NOTE — Progress Notes (Signed)
   LOW-RISK PREGNANCY VISIT Patient name: Taylor [name] MRN [id number]  Date of birth: Oct 22, 1990 Chief Complaint:   Routine Prenatal Visit (+ pressure)  History of Present Illness:   Taylor [name] is a 28 y.o. G21P1001 female at [redacted]w[redacted]d with an Estimated Date of Delivery: 03/19/19 being seen today for ongoing management of a low-risk pregnancy.  Today she reports s/p fall on 02/25/19 with MAU visit; recent URI with neg Covid test- doing better now. Contractions: Irregular. Vag. Bleeding: None.  Movement: Present. denies leaking of fluid. Review of Systems:   Pertinent items are noted in HPI Denies abnormal vaginal discharge w/ itching/odor/irritation, headaches, visual changes, shortness of breath, chest pain, abdominal pain, severe nausea/vomiting, or problems with urination or bowel movements unless otherwise stated above. Pertinent History Reviewed:  Reviewed past medical,surgical, social, obstetrical and family history.  Reviewed problem list, medications and allergies. Physical Assessment:   Vitals:   03/08/19 1137  BP: 119/75  Pulse: (!) 106  Weight: 288 lb 8 oz (130.9 kg)  Body mass index is 49.52 kg/m.        Physical Examination:   General appearance: Well appearing, and in no distress  Mental status: Alert, oriented to person, place, and time  Skin: Warm & dry  Cardiovascular: Normal heart rate noted  Respiratory: Normal respiratory effort, no distress  Abdomen: Soft, gravid, nontender  Pelvic: Cervical exam performed  Dilation: 1 Effacement (%): 50 Station: -3  Extremities: Edema: Trace  Fetal Status: Fetal Heart Rate (bpm): 132 Fundal Height: 39 cm Movement: Present    Results for orders placed or performed in visit on 03/08/19 (from the past 24 hour(s))  POC Urinalysis Dipstick OB   Collection Time: 03/08/19 11:39 AM  Result Value Ref Range   Color, UA     Clarity, UA     Glucose, UA Negative Negative   Bilirubin, UA     Ketones, UA neg    Spec Grav, UA     Blood, UA neg    pH, UA     POC,PROTEIN,UA Trace Negative, Trace, Small (1+), Moderate (2+), Large (3+), 4+   Urobilinogen, UA     Nitrite, UA neg    Leukocytes, UA Negative Negative   Appearance     Odor      Assessment & Plan:  1) Low-risk pregnancy G2P1001 at [redacted]w[redacted]d with an Estimated Date of Delivery: 03/19/19   2) Obesity, U/S for growth already scheduled for 03/14/19 with in person visit to follow   Meds: No orders of the defined types were placed in this encounter.  Labs/procedures today: none  Plan:  Continue routine obstetrical care including growth U/S  Reviewed: Term labor symptoms and general obstetric precautions including but not limited to vaginal bleeding, contractions, leaking of fluid and fetal movement were reviewed in detail with the patient.  All questions were answered. Has home bp cuff. Check bp weekly, let us know if >140/90.   Follow-up: Return for As scheduled.  Orders Placed This Encounter  Procedures  . POC Urinalysis Dipstick OB   Myrtis Ser [facility] 03/08/2019 12:03 PM

## 2019-03-14 ENCOUNTER — Other Ambulatory Visit: Payer: Self-pay

## 2019-03-14 ENCOUNTER — Ambulatory Visit (INDEPENDENT_AMBULATORY_CARE_PROVIDER_SITE_OTHER): Payer: Medicaid Other | Admitting: Obstetrics & Gynecology

## 2019-03-14 ENCOUNTER — Encounter: Payer: Self-pay | Admitting: Obstetrics & Gynecology

## 2019-03-14 ENCOUNTER — Ambulatory Visit (INDEPENDENT_AMBULATORY_CARE_PROVIDER_SITE_OTHER): Payer: Medicaid Other

## 2019-03-14 VITALS — BP 119/79 | HR 96 | Wt 288.5 lb

## 2019-03-14 DIAGNOSIS — Z3483 Encounter for supervision of other normal pregnancy, third trimester: Secondary | ICD-10-CM | POA: Diagnosis not present

## 2019-03-14 DIAGNOSIS — Z3A38 38 weeks gestation of pregnancy: Secondary | ICD-10-CM

## 2019-03-14 DIAGNOSIS — Z331 Pregnant state, incidental: Secondary | ICD-10-CM

## 2019-03-14 DIAGNOSIS — Z1389 Encounter for screening for other disorder: Secondary | ICD-10-CM

## 2019-03-14 DIAGNOSIS — Z3A39 39 weeks gestation of pregnancy: Secondary | ICD-10-CM

## 2019-03-14 LAB — POCT URINALYSIS DIPSTICK OB
Blood, UA: NEGATIVE
Glucose, UA: NEGATIVE
Ketones, UA: NEGATIVE
Leukocytes, UA: NEGATIVE
Nitrite, UA: NEGATIVE
POC,PROTEIN,UA: NEGATIVE

## 2019-03-14 NOTE — Progress Notes (Signed)
   LOW-RISK PREGNANCY VISIT Patient name: Taylor [name] MRN [id number]  Date of birth: Feb 20, 1991 Chief Complaint:   Routine Prenatal Visit ([place] today; has a burning sensation on stomach when moving)  History of Present Illness:   Taylor [name] is a 28 y.o. G62P1001 female at [redacted]w[redacted]d with an Estimated Date of Delivery: 03/19/19 being seen today for ongoing management of a low-risk pregnancy.  Today she reports no complaints. Contractions: Not present. Vag. Bleeding: None.  Movement: Present. denies leaking of fluid. Review of Systems:   Pertinent items are noted in HPI Denies abnormal vaginal discharge w/ itching/odor/irritation, headaches, visual changes, shortness of breath, chest pain, abdominal pain, severe nausea/vomiting, or problems with urination or bowel movements unless otherwise stated above. Pertinent History Reviewed:  Reviewed past medical,surgical, social, obstetrical and family history.  Reviewed problem list, medications and allergies. Physical Assessment:   Vitals:   03/14/19 1159  BP: 119/79  Pulse: 96  Weight: 288 lb 8 oz (130.9 kg)  Body mass index is 49.52 kg/m.        Physical Examination:   General appearance: Well appearing, and in no distress  Mental status: Alert, oriented to person, place, and time  Skin: Warm & dry  Cardiovascular: Normal heart rate noted  Respiratory: Normal respiratory effort, no distress  Abdomen: Soft, gravid, nontender  Pelvic: Cervical exam performed         Extremities: Edema: None  Fetal Status:     Movement: Present    Chaperone: Levy Pupa    Results for orders placed or performed in visit on 03/14/19 (from the past 24 hour(s))  POC Urinalysis Dipstick OB   Collection Time: 03/14/19 12:01 PM  Result Value Ref Range   Color, UA     Clarity, UA     Glucose, UA Negative Negative   Bilirubin, UA     Ketones, UA neg    Spec Grav, UA     Blood, UA neg    pH, UA     POC,PROTEIN,UA Negative Negative, Trace, Small (1+),  Moderate (2+), Large (3+), 4+   Urobilinogen, UA     Nitrite, UA neg    Leukocytes, UA Negative Negative   Appearance     Odor      Assessment & Plan:  1) Low-risk pregnancy G2P1001 at [redacted]w[redacted]d with an Estimated Date of Delivery: 03/19/19    Meds: No orders of the defined types were placed in this encounter.  Labs/procedures today: sonogram EFW 4098 grams 90%  Plan:  Continue routine obstetrical care  Next visit: prefers in person    Reviewed: Preterm labor symptoms and general obstetric precautions including but not limited to vaginal bleeding, contractions, leaking of fluid and fetal movement were reviewed in detail with the patient.  All questions were answered. Has home bp cuff. Rx faxed to . Check bp weekly, let us know if >140/90.   Follow-up: Return in about 1 week (around 03/21/2019) for NST, LROB.  Orders Placed This Encounter  Procedures  . POC Urinalysis Dipstick OB   Mertie Clause Cordell Coke  03/14/2019 12:16 PM

## 2019-03-14 NOTE — Progress Notes (Addendum)
[place] 0000000 wks,cephalic,anterior placenta gr 3,afi 19 cm,fhr 132 bpm,efw 4086 g 90%

## 2019-03-21 ENCOUNTER — Telehealth (HOSPITAL_COMMUNITY): Payer: Self-pay | Admitting: *Deleted

## 2019-03-21 ENCOUNTER — Encounter: Payer: Self-pay | Admitting: Women's Health

## 2019-03-21 ENCOUNTER — Encounter (HOSPITAL_COMMUNITY): Payer: Self-pay

## 2019-03-21 ENCOUNTER — Encounter (HOSPITAL_COMMUNITY): Payer: Self-pay | Admitting: *Deleted

## 2019-03-21 ENCOUNTER — Ambulatory Visit (INDEPENDENT_AMBULATORY_CARE_PROVIDER_SITE_OTHER): Payer: Medicaid Other | Admitting: Women's Health

## 2019-03-21 ENCOUNTER — Other Ambulatory Visit: Payer: Self-pay

## 2019-03-21 VITALS — BP 132/70 | HR 86 | Wt 293.0 lb

## 2019-03-21 DIAGNOSIS — Z1389 Encounter for screening for other disorder: Secondary | ICD-10-CM

## 2019-03-21 DIAGNOSIS — O480 Post-term pregnancy: Secondary | ICD-10-CM

## 2019-03-21 DIAGNOSIS — Z3A40 40 weeks gestation of pregnancy: Secondary | ICD-10-CM | POA: Diagnosis not present

## 2019-03-21 DIAGNOSIS — Z331 Pregnant state, incidental: Secondary | ICD-10-CM

## 2019-03-21 DIAGNOSIS — Z3483 Encounter for supervision of other normal pregnancy, third trimester: Secondary | ICD-10-CM

## 2019-03-21 LAB — POCT URINALYSIS DIPSTICK OB
Blood, UA: NEGATIVE
Glucose, UA: NEGATIVE
Ketones, UA: NEGATIVE
Nitrite, UA: NEGATIVE
POC,PROTEIN,UA: NEGATIVE

## 2019-03-21 NOTE — Progress Notes (Signed)
   LOW-RISK PREGNANCY VISIT Patient name: Taylor [name] MRN [id number]  Date of birth: 06/29/1990 Chief Complaint:   Routine Prenatal Visit (NST)  History of Present Illness:   Taylor [name] is a 28 y.o. G62P1001 female at [redacted]w[redacted]d with an Estimated Date of Delivery: 03/19/19 being seen today for ongoing management of a low-risk pregnancy.  Today she reports no complaints. Contractions: Irregular. Vag. Bleeding: None.  Movement: Present. denies leaking of fluid. Review of Systems:   Pertinent items are noted in HPI Denies abnormal vaginal discharge w/ itching/odor/irritation, headaches, visual changes, shortness of breath, chest pain, abdominal pain, severe nausea/vomiting, or problems with urination or bowel movements unless otherwise stated above. Pertinent History Reviewed:  Reviewed past medical,surgical, social, obstetrical and family history.  Reviewed problem list, medications and allergies. Physical Assessment:   Vitals:   03/21/19 0850  BP: 132/70  Pulse: 86  Weight: 293 lb (132.9 kg)  Body mass index is 50.29 kg/m.        Physical Examination:   General appearance: Well appearing, and in no distress  Mental status: Alert, oriented to person, place, and time  Skin: Warm & dry  Cardiovascular: Normal heart rate noted  Respiratory: Normal respiratory effort, no distress  Abdomen: Soft, gravid, nontender  Pelvic: Cervical exam performed  Dilation: 1.5 Effacement (%): 50 Station: -3, pt wants membranes swept, I was unable to adequately do, Dr. Elonda Husky in to sweep membranes  Extremities: Edema: None  Fetal Status: Fetal Heart Rate (bpm): 150 Fundal Height: 42 cm Movement: Present Presentation: Vertex  NST: FHR baseline 150 bpm, Variability: moderate, Accelerations:present, Decelerations:  Absent= Cat 1/Reactive Toco: none   Chaperone: Amanda Rash    Results for orders placed or performed in visit on 03/21/19 (from the past 24 hour(s))  POC Urinalysis Dipstick OB   Collection  Time: 03/21/19  8:55 AM  Result Value Ref Range   Color, UA     Clarity, UA     Glucose, UA Negative Negative   Bilirubin, UA     Ketones, UA neg    Spec Grav, UA     Blood, UA neg    pH, UA     POC,PROTEIN,UA Negative Negative, Trace, Small (1+), Moderate (2+), Large (3+), 4+   Urobilinogen, UA     Nitrite, UA neg    Leukocytes, UA Small (1+) (A) Negative   Appearance     Odor      Assessment & Plan:  1) Low-risk pregnancy G2P1001 at [redacted]w[redacted]d with an Estimated Date of Delivery: 03/19/19   2) Postdates, IOL scheduled for 11/15 daytime, membranes swept today, IOL form faxed via Epic and orders placed   3) Suspected LGA> EFW 9lb @ 38wks   Meds: No orders of the defined types were placed in this encounter.  Labs/procedures today: nst, sve, swept membranes  Plan: IOL Sunday  Reviewed: Term labor symptoms and general obstetric precautions including but not limited to vaginal bleeding, contractions, leaking of fluid and fetal movement were reviewed in detail with the patient.  All questions were answered.   Follow-up: No follow-ups on file.  Orders Placed This Encounter  Procedures  . POC Urinalysis Dipstick OB   Roma Schanz CNM, [facility] 03/21/2019 11:19 AM

## 2019-03-21 NOTE — Telephone Encounter (Signed)
Preadmission screen  

## 2019-03-21 NOTE — Progress Notes (Signed)
Induction Assessment Scheduling Form Fax to [facility]:  [id number]  Carlinda Tape                                                                                   DOB:  08-17-1990                                                            MRN:  [id number]                                                                     Phone #:   [phone]                      Provider:  [facility]  GP:  [id number]                                                            Estimated Date of Delivery: 03/19/19  Dating Criteria: 6wk u/s    Medical Indications for induction:  postdates Admission Date/Time:  11/15 Gestational age on admission:  41.0   Filed Weights   03/21/19 0850  Weight: 293 lb (132.9 kg)   HIV:  Non Reactive (08/10 0904) GBS: --[place] (10/12 1545)  1.5/50/-3, vtx  Method of induction(proposed): pit   Scheduling Provider Signature:  Roma Schanz, CNM                                            Today's Date:  03/21/2019

## 2019-03-21 NOTE — Patient Instructions (Signed)
Taylor [name], I greatly value your feedback.  If you receive a survey following your visit with [place] today, we appreciate you taking the time to fill it out.  Thanks, [name], CNM, [facility]  [place]!!! It is now [place] at [facility] ([place], [place] [address]) Entrance located off of [address] parking   Your induction is scheduled for Sunday 11/15. Please DO NOT show up at the time you see in [place]. Someone from [place] will call you on the date of your induction to let you know what time to come in. Please keep your phone on and with you at all times, you have 1 hour to respond to them to let them know you are on your way.  Go to the main desk at the [facility] and let them know you are there to be induced. They will send someone from Labor & Delivery to come get you.  You will get a call from a nurse from the hospital within the next day or so to go over some information, she will also schedule you to go to the hospital a few days before you induction to have your Covid test. If you have any questions, please let us know.    Go to [email or web address] to register for FREE online childbirth classes    Call the office [phone]) or go to [facility] if:  You begin to have strong, frequent contractions  Your water breaks.  Sometimes it is a big gush of fluid, sometimes it is just a trickle that keeps getting your panties wet or running down your legs  You have vaginal bleeding.  It is normal to have a small amount of spotting if your cervix was checked.   You don't feel your baby moving like normal.  If you don't, get you something to eat and drink and lay down and focus on feeling your baby move.  You should feel at least 10 movements in 2 hours.  If you don't, you should call the office or go to [place] Blood Pressure Monitoring for Patients   Your provider has  recommended that you check your blood pressure (BP) at least once a week at home. If you do not have a blood pressure cuff at home, one will be provided for you. Contact your provider if you have not received your monitor within 1 week.   Helpful Tips for Accurate Home Blood Pressure Checks  . Don't smoke, exercise, or drink caffeine 30 minutes before checking your BP . Use the restroom before checking your BP (a full bladder can raise your pressure) . Relax in a comfortable upright chair . Feet on the ground . Left arm resting comfortably on a flat surface at the level of your heart . Legs uncrossed . Back supported . Sit quietly and don't talk . Place the cuff on your bare arm . Adjust snuggly, so that only two fingertips can fit between your skin and the top of the cuff . Check 2 readings separated by at least one minute . Keep a log of your BP readings . For a visual, please reference this diagram: http://ccnc.care/bpdiagram  Provider Name: [facility]     Phone: [phone]  Zone 1: ALL CLEAR  Continue to monitor your symptoms:  . BP reading is less than 140 (top number) or less than 90 (bottom number)  .  No right upper stomach pain . No headaches or seeing spots . No feeling nauseated or throwing up . No swelling in face and hands  Zone 2: CAUTION Call your doctor's office for any of the following:  . BP reading is greater than 140 (top number) or greater than 90 (bottom number)  . Stomach pain under your ribs in the middle or right side . Headaches or seeing spots . Feeling nauseated or throwing up . Swelling in face and hands  Zone 3: EMERGENCY  Seek immediate medical care if you have any of the following:  . BP reading is greater than160 (top number) or greater than 110 (bottom number) . Severe headaches not improving with Tylenol . Serious difficulty catching your breath . Any worsening symptoms from Zone 2    Braxton Hicks Contractions Contractions of  the uterus can occur throughout pregnancy, but they are not always a sign that you are in labor. You may have practice contractions called Braxton Hicks contractions. These false labor contractions are sometimes confused with true labor. What are [place] contractions? Braxton Hicks contractions are tightening movements that occur in the muscles of the uterus before labor. Unlike true labor contractions, these contractions do not result in opening (dilation) and thinning of the cervix. Toward the end of pregnancy (32-34 weeks), Braxton Hicks contractions can happen more often and may become stronger. These contractions are sometimes difficult to tell apart from true labor because they can be very uncomfortable. You should not feel embarrassed if you go to the hospital with false labor. Sometimes, the only way to tell if you are in true labor is for your health care provider to look for changes in the cervix. The health care provider will do a physical exam and may monitor your contractions. If you are not in true labor, the exam should show that your cervix is not dilating and your water has not broken. If there are no other health problems associated with your pregnancy, it is completely safe for you to be sent home with false labor. You may continue to have Braxton Hicks contractions until you go into true labor. How to tell the difference between true labor and false labor True labor  Contractions last 30-70 seconds.  Contractions become very regular.  Discomfort is usually felt in the top of the uterus, and it spreads to the lower abdomen and low back.  Contractions do not go away with walking.  Contractions usually become more intense and increase in frequency.  The cervix dilates and gets thinner. False labor  Contractions are usually shorter and not as strong as true labor contractions.  Contractions are usually irregular.  Contractions are often felt in the front of the lower  abdomen and in the groin.  Contractions may go away when you walk around or change positions while lying down.  Contractions get weaker and are shorter-lasting as time goes on.  The cervix usually does not dilate or become thin. Follow these instructions at home:   Take over-the-counter and prescription medicines only as told by your health care provider.  Keep up with your usual exercises and follow other instructions from your health care provider.  Eat and drink lightly if you think you are going into labor.  If Braxton Hicks contractions are making you uncomfortable: ? Change your position from lying down or resting to walking, or change from walking to resting. ? Sit and rest in a tub of warm water. ? Drink enough fluid to  keep your urine pale yellow. Dehydration may cause these contractions. ? Do slow and deep breathing several times an hour.  Keep all follow-up prenatal visits as told by your health care provider. This is important. Contact a health care provider if:  You have a fever.  You have continuous pain in your abdomen. Get help right away if:  Your contractions become stronger, more regular, and closer together.  You have fluid leaking or gushing from your vagina.  You pass blood-tinged mucus (bloody show).  You have bleeding from your vagina.  You have low back pain that you never had before.  You feel your baby's head pushing down and causing pelvic pressure.  Your baby is not moving inside you as much as it used to. Summary  Contractions that occur before labor are called Braxton Hicks contractions, false labor, or practice contractions.  Braxton Hicks contractions are usually shorter, weaker, farther apart, and less regular than true labor contractions. True labor contractions usually become progressively stronger and regular, and they become more frequent.  Manage discomfort from [facility] contractions by changing position, resting in a warm  bath, drinking plenty of water, or practicing deep breathing. This information is not intended to replace advice given to you by your health care provider. Make sure you discuss any questions you have with your health care provider. Document Released: 09/10/2016 Document Revised: 04/09/2017 Document Reviewed: 09/10/2016 Elsevier Patient Education  2020 [employer].

## 2019-03-22 ENCOUNTER — Other Ambulatory Visit: Payer: Self-pay | Admitting: Advanced Practice Midwife

## 2019-03-23 ENCOUNTER — Other Ambulatory Visit: Payer: Self-pay

## 2019-03-23 ENCOUNTER — Inpatient Hospital Stay (HOSPITAL_COMMUNITY)
Admission: AD | Admit: 2019-03-23 | Discharge: 2019-03-23 | Disposition: A | Discharge status: Home / Self Care | Payer: Medicaid Other | Attending: Obstetrics & Gynecology | Admitting: Obstetrics & Gynecology

## 2019-03-23 ENCOUNTER — Encounter (HOSPITAL_COMMUNITY): Payer: Self-pay

## 2019-03-23 DIAGNOSIS — Z3483 Encounter for supervision of other normal pregnancy, third trimester: Secondary | ICD-10-CM

## 2019-03-23 DIAGNOSIS — Z3A40 40 weeks gestation of pregnancy: Secondary | ICD-10-CM | POA: Insufficient documentation

## 2019-03-23 DIAGNOSIS — O99210 Obesity complicating pregnancy, unspecified trimester: Secondary | ICD-10-CM

## 2019-03-23 DIAGNOSIS — O471 False labor at or after 37 completed weeks of gestation: Secondary | ICD-10-CM | POA: Diagnosis not present

## 2019-03-23 DIAGNOSIS — O99333 Smoking (tobacco) complicating pregnancy, third trimester: Secondary | ICD-10-CM

## 2019-03-23 NOTE — Discharge Instructions (Signed)
Braxton Hicks Contractions °Contractions of the uterus can occur throughout pregnancy, but they are not always a sign that you are in labor. You may have practice contractions called Braxton Hicks contractions. These false labor contractions are sometimes confused with true labor. °What are Braxton Hicks contractions? °Braxton Hicks contractions are tightening movements that occur in the muscles of the uterus before labor. Unlike true labor contractions, these contractions do not result in opening (dilation) and thinning of the cervix. Toward the end of pregnancy (32-34 weeks), Braxton Hicks contractions can happen more often and may become stronger. These contractions are sometimes difficult to tell apart from true labor because they can be very uncomfortable. You should not feel embarrassed if you go to the hospital with false labor. °Sometimes, the only way to tell if you are in true labor is for your health care provider to look for changes in the cervix. The health care provider will do a physical exam and may monitor your contractions. If you are not in true labor, the exam should show that your cervix is not dilating and your water has not broken. °If there are no other health problems associated with your pregnancy, it is completely safe for you to be sent home with false labor. You may continue to have Braxton Hicks contractions until you go into true labor. °How to tell the difference between true labor and false labor °True labor °· Contractions last 30-70 seconds. °· Contractions become very regular. °· Discomfort is usually felt in the top of the uterus, and it spreads to the lower abdomen and low back. °· Contractions do not go away with walking. °· Contractions usually become more intense and increase in frequency. °· The cervix dilates and gets thinner. °False labor °· Contractions are usually shorter and not as strong as true labor contractions. °· Contractions are usually irregular. °· Contractions  are often felt in the front of the lower abdomen and in the groin. °· Contractions may go away when you walk around or change positions while lying down. °· Contractions get weaker and are shorter-lasting as time goes on. °· The cervix usually does not dilate or become thin. °Follow these instructions at home: ° °· Take over-the-counter and prescription medicines only as told by your health care provider. °· Keep up with your usual exercises and follow other instructions from your health care provider. °· Eat and drink lightly if you think you are going into labor. °· If Braxton Hicks contractions are making you uncomfortable: °? Change your position from lying down or resting to walking, or change from walking to resting. °? Sit and rest in a tub of warm water. °? Drink enough fluid to keep your urine pale yellow. Dehydration may cause these contractions. °? Do slow and deep breathing several times an hour. °· Keep all follow-up prenatal visits as told by your health care provider. This is important. °Contact a health care provider if: °· You have a fever. °· You have continuous pain in your abdomen. °Get help right away if: °· Your contractions become stronger, more regular, and closer together. °· You have fluid leaking or gushing from your vagina. °· You pass blood-tinged mucus (bloody show). °· You have bleeding from your vagina. °· You have low back pain that you never had before. °· You feel your baby’s head pushing down and causing pelvic pressure. °· Your baby is not moving inside you as much as it used to. °Summary °· Contractions that occur before labor are   called Braxton Hicks contractions, false labor, or practice contractions.  Braxton Hicks contractions are usually shorter, weaker, farther apart, and less regular than true labor contractions. True labor contractions usually become progressively stronger and regular, and they become more frequent.  Manage discomfort from Braxton Hicks contractions  by changing position, resting in a warm bath, drinking plenty of water, or practicing deep breathing. This information is not intended to replace advice given to you by your health care provider. Make sure you discuss any questions you have with your health care provider. Document Released: 09/10/2016 Document Revised: 04/09/2017 Document Reviewed: 09/10/2016 Elsevier Patient Education  2020 [employer] Inc[employer]  

## 2019-03-23 NOTE — MAU Note (Signed)
.  Taylor [name] is a 28 y.o. at [redacted]w[redacted]d here in MAU reporting: ctx started at 0130 this morning that increased in intensity and lasted 2 minutes and were 10 minutes apart. Also reports pressure. No VB or LOF. + FM. Pain score: 7  FHT 140

## 2019-03-23 NOTE — MAU Note (Signed)
I have communicated with Dr.Ellery and reviewed vital signs:  Vitals:   03/23/19 0629 03/23/19 0630  BP: 120/68 120/68  Pulse: 95 93  Resp: 17   Temp: 98.4 F (36.9 C)   SpO2: 99% 97%    Vaginal exam:  Dilation: 3 Effacement (%): 50 Station: [place], -3 Exam by:: Kitai Purdom RN,   Also reviewed contraction pattern and that non-stress test is reactive.  It has been documented that patient is having irregular ctxs with no cervical change over 1 hours not indicating active labor.  Patient denies any other complaints.  Based on this report provider has given order for discharge.  A discharge order and diagnosis entered by a provider.   Labor discharge instructions reviewed with patient.

## 2019-03-24 ENCOUNTER — Other Ambulatory Visit (HOSPITAL_COMMUNITY)
Admission: RE | Admit: 2019-03-24 | Discharge: 2019-03-24 | Disposition: A | Discharge status: Home / Self Care | Payer: Medicaid Other | Source: Ambulatory Visit | Attending: Obstetrics & Gynecology | Admitting: Obstetrics & Gynecology

## 2019-03-24 ENCOUNTER — Other Ambulatory Visit: Payer: Self-pay

## 2019-03-24 LAB — SARS CORONAVIRUS 2 (TAT 6-24 HRS): SARS Coronavirus 2: NEGATIVE

## 2019-03-24 NOTE — MAU Note (Signed)
Asymptomatic, swab collected. 

## 2019-03-25 ENCOUNTER — Other Ambulatory Visit: Payer: Self-pay

## 2019-03-25 ENCOUNTER — Encounter (HOSPITAL_COMMUNITY): Payer: Self-pay

## 2019-03-25 ENCOUNTER — Inpatient Hospital Stay (HOSPITAL_COMMUNITY): Payer: Medicaid Other | Admitting: Anesthesiology

## 2019-03-25 ENCOUNTER — Inpatient Hospital Stay (HOSPITAL_COMMUNITY)
Admission: AD | Admit: 2019-03-25 | Discharge: 2019-03-26 | DRG: 807 | Disposition: A | Discharge status: Home / Self Care | Payer: Medicaid Other | Attending: Obstetrics and Gynecology | Admitting: Obstetrics and Gynecology

## 2019-03-25 DIAGNOSIS — O480 Post-term pregnancy: Secondary | ICD-10-CM | POA: Diagnosis not present

## 2019-03-25 DIAGNOSIS — Z20828 Contact with and (suspected) exposure to other viral communicable diseases: Secondary | ICD-10-CM | POA: Diagnosis present

## 2019-03-25 DIAGNOSIS — Z30430 Encounter for insertion of intrauterine contraceptive device: Secondary | ICD-10-CM | POA: Diagnosis not present

## 2019-03-25 DIAGNOSIS — O99333 Smoking (tobacco) complicating pregnancy, third trimester: Secondary | ICD-10-CM

## 2019-03-25 DIAGNOSIS — O99334 Smoking (tobacco) complicating childbirth: Secondary | ICD-10-CM | POA: Diagnosis present

## 2019-03-25 DIAGNOSIS — F17210 Nicotine dependence, cigarettes, uncomplicated: Secondary | ICD-10-CM | POA: Diagnosis present

## 2019-03-25 DIAGNOSIS — Z3A40 40 weeks gestation of pregnancy: Secondary | ICD-10-CM | POA: Diagnosis not present

## 2019-03-25 DIAGNOSIS — Z3483 Encounter for supervision of other normal pregnancy, third trimester: Secondary | ICD-10-CM

## 2019-03-25 DIAGNOSIS — G4730 Sleep apnea, unspecified: Secondary | ICD-10-CM | POA: Diagnosis not present

## 2019-03-25 DIAGNOSIS — O99214 Obesity complicating childbirth: Secondary | ICD-10-CM | POA: Diagnosis present

## 2019-03-25 DIAGNOSIS — O99354 Diseases of the nervous system complicating childbirth: Secondary | ICD-10-CM | POA: Diagnosis not present

## 2019-03-25 DIAGNOSIS — O99210 Obesity complicating pregnancy, unspecified trimester: Secondary | ICD-10-CM

## 2019-03-25 LAB — POCT FERN TEST: POCT Fern Test: POSITIVE

## 2019-03-25 LAB — COMPREHENSIVE METABOLIC PANEL
ALT: 13 U/L (ref 0–44)
AST: 15 U/L (ref 15–41)
Albumin: 2.7 g/dL — ABNORMAL LOW (ref 3.5–5.0)
Alkaline Phosphatase: 140 U/L — ABNORMAL HIGH (ref 38–126)
Anion gap: 10 (ref 5–15)
BUN: 6 mg/dL (ref 6–20)
CO2: 21 mmol/L — ABNORMAL LOW (ref 22–32)
Calcium: 8.8 mg/dL — ABNORMAL LOW (ref 8.9–10.3)
Chloride: 103 mmol/L (ref 98–111)
Creatinine, Ser: 0.37 mg/dL — ABNORMAL LOW (ref 0.44–1.00)
GFR calc Af Amer: >60 mL/min (ref >60)
GFR calc non Af Amer: >60 mL/min (ref >60)
Glucose, Bld: 83 mg/dL (ref 70–99)
Potassium: 3.8 mmol/L (ref 3.5–5.1)
Sodium: 134 mmol/L — ABNORMAL LOW (ref 135–145)
Total Bilirubin: 0.3 mg/dL (ref 0.3–1.2)
Total Protein: 6.3 g/dL — ABNORMAL LOW (ref 6.5–8.1)

## 2019-03-25 LAB — CBC
HCT: 32.8 % — ABNORMAL LOW (ref 36.0–46.0)
Hemoglobin: 11.6 g/dL — ABNORMAL LOW (ref 12.0–15.0)
MCH: 33.1 pg (ref 26.0–34.0)
MCHC: 35.4 g/dL (ref 30.0–36.0)
MCV: 93.7 fL (ref 80.0–100.0)
Platelets: 252 10*3/uL (ref 150–400)
RBC: 3.5 MIL/uL — ABNORMAL LOW (ref 3.87–5.11)
RDW: 13.5 % (ref 11.5–15.5)
WBC: 14.8 10*3/uL — ABNORMAL HIGH (ref 4.0–10.5)
nRBC: 0 % (ref 0.0–0.2)

## 2019-03-25 LAB — PROTEIN / CREATININE RATIO, URINE
Creatinine, Urine: 62.71 mg/dL
Creatinine, Urine: 13.71 mg/dL
Protein Creatinine Ratio: 0.37 mg/mg{Cre} — ABNORMAL HIGH (ref 0.00–0.15)
Total Protein, Urine: 23 mg/dL
Total Protein, Urine: <6 mg/dL

## 2019-03-25 LAB — TYPE AND SCREEN
ABO/RH(D): A POS
Antibody Screen: NEGATIVE

## 2019-03-25 LAB — ABO/RH: ABO/RH(D): A POS

## 2019-03-25 MED ORDER — EPHEDRINE 5 MG/ML INJ: 10.0000 mg | INTRAVENOUS | Status: DC | PRN

## 2019-03-25 MED ORDER — ACETAMINOPHEN 325 MG PO TABS: 650.0000 mg | ORAL_TABLET | ORAL | Status: DC | PRN

## 2019-03-25 MED ORDER — SIMETHICONE 80 MG PO CHEW: 80.0000 mg | CHEWABLE_TABLET | ORAL | Status: DC | PRN

## 2019-03-25 MED ORDER — PRENATAL MULTIVITAMIN CH
1.0000 | ORAL_TABLET | Freq: Every day | ORAL | Status: DC
  Administered 2019-03-26: 1 via ORAL
  Filled 2019-03-25: qty 1

## 2019-03-25 MED ORDER — WITCH HAZEL-GLYCERIN EX PADS: 1.0000 "application " | MEDICATED_PAD | CUTANEOUS | Status: DC | PRN

## 2019-03-25 MED ORDER — PHENYLEPHRINE 40 MCG/ML (10ML) SYRINGE FOR IV PUSH (FOR BLOOD PRESSURE SUPPORT): 80.0000 ug | PREFILLED_SYRINGE | INTRAVENOUS | Status: DC | PRN

## 2019-03-25 MED ORDER — TERBUTALINE SULFATE 1 MG/ML IJ SOLN: 0.2500 mg | Freq: Once | INTRAMUSCULAR | Status: DC | PRN

## 2019-03-25 MED ORDER — LIDOCAINE HCL (PF) 1 % IJ SOLN
30.0000 mL | INTRAMUSCULAR | Status: AC | PRN
  Administered 2019-03-25: 30 mL via SUBCUTANEOUS
  Filled 2019-03-25: qty 30

## 2019-03-25 MED ORDER — DIBUCAINE (PERIANAL) 1 % EX OINT: 1.0000 "application " | TOPICAL_OINTMENT | CUTANEOUS | Status: DC | PRN

## 2019-03-25 MED ORDER — LACTATED RINGERS IV SOLN
INTRAVENOUS | Status: DC
  Administered 2019-03-25 (×2): via INTRAVENOUS

## 2019-03-25 MED ORDER — OXYTOCIN BOLUS FROM INFUSION
500.0000 mL | Freq: Once | INTRAVENOUS | Status: AC
  Administered 2019-03-25: 500 mL via INTRAVENOUS

## 2019-03-25 MED ORDER — OXYCODONE-ACETAMINOPHEN 5-325 MG PO TABS: 2.0000 | ORAL_TABLET | ORAL | Status: DC | PRN

## 2019-03-25 MED ORDER — TETANUS-DIPHTH-ACELL PERTUSSIS 5-2.5-18.5 LF-MCG/0.5 IM SUSP: 0.5000 mL | Freq: Once | INTRAMUSCULAR | Status: DC

## 2019-03-25 MED ORDER — FENTANYL-BUPIVACAINE-NACL 0.5-0.125-0.9 MG/250ML-% EP SOLN: 12.0000 mL/h | EPIDURAL | Status: DC | PRN

## 2019-03-25 MED ORDER — ZOLPIDEM TARTRATE 5 MG PO TABS: 5.0000 mg | ORAL_TABLET | Freq: Every evening | ORAL | Status: DC | PRN

## 2019-03-25 MED ORDER — SODIUM CHLORIDE (PF) 0.9 % IJ SOLN
INTRAMUSCULAR | Status: DC | PRN
  Administered 2019-03-25: 12 mL/h via EPIDURAL

## 2019-03-25 MED ORDER — OXYTOCIN 40 UNITS IN NORMAL SALINE INFUSION - SIMPLE MED
1.0000 m[IU]/min | INTRAVENOUS | Status: DC
  Administered 2019-03-25: 2 m[IU]/min via INTRAVENOUS
  Filled 2019-03-25: qty 1000

## 2019-03-25 MED ORDER — BENZOCAINE-MENTHOL 20-0.5 % EX AERO
1.0000 "application " | INHALATION_SPRAY | CUTANEOUS | Status: DC | PRN
  Administered 2019-03-25: 1 via TOPICAL
  Filled 2019-03-25: qty 56

## 2019-03-25 MED ORDER — FENTANYL-BUPIVACAINE-NACL 0.5-0.125-0.9 MG/250ML-% EP SOLN
EPIDURAL | Status: AC
  Filled 2019-03-25: qty 250

## 2019-03-25 MED ORDER — IBUPROFEN 600 MG PO TABS
600.0000 mg | ORAL_TABLET | Freq: Four times a day (QID) | ORAL | Status: DC
  Administered 2019-03-26 (×4): 600 mg via ORAL
  Filled 2019-03-25 (×4): qty 1

## 2019-03-25 MED ORDER — LEVONORGESTREL 19.5 MCG/DAY IU IUD
INTRAUTERINE_SYSTEM | Freq: Once | INTRAUTERINE | Status: AC
  Administered 2019-03-25: 1 via INTRAUTERINE
  Filled 2019-03-25: qty 1

## 2019-03-25 MED ORDER — FLEET ENEMA 7-19 GM/118ML RE ENEM: 1.0000 | ENEMA | RECTAL | Status: DC | PRN

## 2019-03-25 MED ORDER — LACTATED RINGERS IV SOLN: 500.0000 mL | Freq: Once | INTRAVENOUS | Status: AC

## 2019-03-25 MED ORDER — DIPHENHYDRAMINE HCL 50 MG/ML IJ SOLN: 12.5000 mg | INTRAMUSCULAR | Status: DC | PRN

## 2019-03-25 MED ORDER — ACETAMINOPHEN 325 MG PO TABS
650.0000 mg | ORAL_TABLET | ORAL | Status: DC | PRN
  Administered 2019-03-25 – 2019-03-26 (×2): 650 mg via ORAL
  Filled 2019-03-25 (×2): qty 2

## 2019-03-25 MED ORDER — OXYTOCIN 40 UNITS IN NORMAL SALINE INFUSION - SIMPLE MED: 2.5000 [IU]/h | INTRAVENOUS | Status: DC

## 2019-03-25 MED ORDER — OXYCODONE-ACETAMINOPHEN 5-325 MG PO TABS: 1.0000 | ORAL_TABLET | ORAL | Status: DC | PRN

## 2019-03-25 MED ORDER — LACTATED RINGERS IV SOLN
500.0000 mL | INTRAVENOUS | Status: DC | PRN
  Administered 2019-03-25: 500 mL via INTRAVENOUS

## 2019-03-25 MED ORDER — HYDROXYZINE HCL 50 MG PO TABS: 50.0000 mg | ORAL_TABLET | Freq: Four times a day (QID) | ORAL | Status: DC | PRN

## 2019-03-25 MED ORDER — COCONUT OIL OIL: 1.0000 "application " | TOPICAL_OIL | Status: DC | PRN

## 2019-03-25 MED ORDER — ONDANSETRON HCL 4 MG/2ML IJ SOLN
4.0000 mg | Freq: Four times a day (QID) | INTRAMUSCULAR | Status: DC | PRN
  Administered 2019-03-25: 4 mg via INTRAVENOUS
  Filled 2019-03-25: qty 2

## 2019-03-25 MED ORDER — DIPHENHYDRAMINE HCL 25 MG PO CAPS: 25.0000 mg | ORAL_CAPSULE | Freq: Four times a day (QID) | ORAL | Status: DC | PRN

## 2019-03-25 MED ORDER — SOD CITRATE-CITRIC ACID 500-334 MG/5ML PO SOLN: 30.0000 mL | ORAL | Status: DC | PRN

## 2019-03-25 MED ORDER — ONDANSETRON HCL 4 MG/2ML IJ SOLN: 4.0000 mg | INTRAMUSCULAR | Status: DC | PRN

## 2019-03-25 MED ORDER — ONDANSETRON HCL 4 MG PO TABS: 4.0000 mg | ORAL_TABLET | ORAL | Status: DC | PRN

## 2019-03-25 MED ORDER — FENTANYL CITRATE (PF) 100 MCG/2ML IJ SOLN: 50.0000 ug | INTRAMUSCULAR | Status: DC | PRN

## 2019-03-25 MED ORDER — SENNOSIDES-DOCUSATE SODIUM 8.6-50 MG PO TABS
2.0000 | ORAL_TABLET | ORAL | Status: DC
  Administered 2019-03-26: 2 via ORAL
  Filled 2019-03-25: qty 2

## 2019-03-25 MED ORDER — LIDOCAINE HCL (PF) 1 % IJ SOLN
INTRAMUSCULAR | Status: DC | PRN
  Administered 2019-03-25: 11 mL via EPIDURAL

## 2019-03-25 NOTE — Progress Notes (Signed)
Labor Progress Note Taylor [name] is a 28 y.o. G2P1001 at [redacted]w[redacted]d presented for SROM  S:  Patient comfortable with epidural  O:  BP 113/69   Pulse 80   Temp 97.8 F (36.6 C) (Oral)   Resp 16   Ht 5\' 4"  (1.626 m)   Wt 133.6 kg   LMP  (LMP Unknown)   SpO2 99%   BMI 50.57 kg/m   Fetal Tracing:  Baseline: 150 Variability: moderate Accels: 15x15 Decels: none  Toco: 2   CVE: Dilation: 4 Effacement (%): 70 Station: -2 Presentation: Vertex Exam by:: C.Neill,CNM   A&P: 28 y.o. G2P1001 [redacted]w[redacted]d SROM #Labor: Progressing well. Will continue to titrate pitocin as long as FHR tolerates #Pain: epidural #FWB: Cat 1 #GBS negative  Wende Mott, CNM 3:06 PM

## 2019-03-25 NOTE — MAU Note (Signed)
Taylor [name] is a 28 y.o. at [redacted]w[redacted]d here in MAU reporting: since 0600 has been having "random bursts of fluid". States it soaked through her clothes. States the fluid is clear and watery and is mixed with yellow mucus. Having a lot of pressure and cramping but no regular contractions. No bleeding. +FM  Onset of complaint: today  Pain score: pelvic pressure 6/10, abdomen 5/10  Vitals:   03/25/19 1028  BP: (!) 145/70  Pulse: 92  Resp: 18  Temp: 98.1 F (36.7 C)  SpO2: 98%     FHT: +FM  Lab orders placed from triage: none, urine sample collected

## 2019-03-25 NOTE — MAU Note (Signed)
Vertex presentation confirmed by cnm with bedside US.

## 2019-03-25 NOTE — Progress Notes (Signed)
OB Note ctsp for prolonged decel. In the 80s-90s and by the time I arrived was back to the 100s and then normal. SVE for me 4-5/50/-3/cephalic. FSE placed. +fetal scalp stim, mild meconium. Fetus currently category I with accels. Continue to watch. Would hold off on any augmentation for around 60-41m and strongly recommend early epidural. Pt currently contracting q5-44m. Terbutaline prn.   Durene Romans MD Attending [facility] [employer] (Faculty Practice) 03/25/2019 Time: 1223pm

## 2019-03-25 NOTE — Anesthesia Preprocedure Evaluation (Signed)
Anesthesia Evaluation  Patient identified by MRN, date of birth, ID band Patient awake    Reviewed: Allergy & Precautions, NPO status , Patient's Chart, lab work & pertinent test results  Airway Mallampati: II  TM Distance: >3 FB Neck ROM: Full    Dental no notable dental hx.    Pulmonary sleep apnea , Current Smoker,    Pulmonary exam normal breath sounds clear to auscultation       Cardiovascular negative cardio ROS Normal cardiovascular exam Rhythm:Regular Rate:Normal     Neuro/Psych negative neurological ROS  negative psych ROS   GI/Hepatic Neg liver ROS, GERD  ,  Endo/Other  Morbid obesity  Renal/GU negative Renal ROS  negative genitourinary   Musculoskeletal negative musculoskeletal ROS (+)   Abdominal (+) + obese,   Peds negative pediatric ROS (+)  Hematology negative hematology ROS (+)   Anesthesia Other Findings   Reproductive/Obstetrics (+) Pregnancy                             Anesthesia Physical Anesthesia Plan  ASA: III  Anesthesia Plan: Epidural   Post-op Pain Management:    Induction:   PONV Risk Score and Plan:   Airway Management Planned:   Additional Equipment:   Intra-op Plan:   Post-operative Plan:   Informed Consent:   Plan Discussed with:   Anesthesia Plan Comments:         Anesthesia Quick Evaluation

## 2019-03-25 NOTE — Discharge Summary (Signed)
Postpartum Discharge Summary     Patient Name: Taylor [name] DOB: Dec 14, 1990 MRN: [id number]  Date of admission: 03/25/2019 Delivering Provider: Wende Mott   Date of discharge: 03/26/2019  Admitting diagnosis: yellow fluids leaking  Intrauterine pregnancy: [redacted]w[redacted]d    Secondary diagnosis:  Active Problems:   Post-dates pregnancy  Additional problems: n/a     Discharge diagnosis: Term Pregnancy Delivered                                                                                                Post partum procedures:none  Augmentation: Pitocin  Complications: None  Hospital course:  Onset of Labor With Vaginal Delivery     28y.o. yo G2P1001 at 435w6das admitted in Latent Labor on 03/25/2019. Patient had an uncomplicated labor course as follows: SROM with light meconium stained fluid, pitocin augmentation,  Membrane Rupture Time/Date:  ,   Intrapartum Procedures: Episiotomy: None [1]                                         Lacerations:  2nd degree [3]  Patient had a delivery of a Viable infant. 03/25/2019  Information for the patient's newborn:  JoRayleen, Wyrick0[[id number]]Delivery Method: Vaginal, Spontaneous(Filed from Delivery Summary)     Pateint had an uncomplicated postpartum course.  She is ambulating, tolerating a regular diet, passing flatus, and urinating well. Patient is discharged home in stable condition on 03/26/19.  Delivery time: 5:03 PM    Magnesium Sulfate received: No BMZ received: No Rhophylac:No MMR:N/A Transfusion:No  Physical exam  Vitals:   03/25/19 2114 03/26/19 0114 03/26/19 0554 03/26/19 0816  BP: (!) 106/59 110/62 105/64 122/67  Pulse: 89 78 72 81  Resp: 18 18 18    Temp: 97.9 F (36.6 C) 98.1 F (36.7 C) 97.9 F (36.6 C) 99.5 F (37.5 C)  TempSrc:  Oral Oral Axillary  SpO2: 98% 99% 97% 100%  Weight:      Height:       General: alert, cooperative and no distress Lochia: appropriate Uterine Fundus:  firm Incision: N/A DVT Evaluation: No evidence of DVT seen on physical exam. Labs: Lab Results  Component Value Date   WBC 17.1 (H) 03/26/2019   HGB 9.5 (L) 03/26/2019   HCT 28.0 (L) 03/26/2019   MCV 94.6 03/26/2019   PLT 202 03/26/2019   CMP Latest Ref Rng & Units 03/25/2019  Glucose 70 - 99 mg/dL 83  BUN 6 - 20 mg/dL 6  Creatinine 0.44 - 1.00 mg/dL 0.37(L)  Sodium 135 - 145 mmol/L 134(L)  Potassium 3.5 - 5.1 mmol/L 3.8  Chloride 98 - 111 mmol/L 103  CO2 22 - 32 mmol/L 21(L)  Calcium 8.9 - 10.3 mg/dL 8.8(L)  Total Protein 6.5 - 8.1 g/dL 6.3(L)  Total Bilirubin 0.3 - 1.2 mg/dL 0.3  Alkaline Phos 38 - 126 U/L 140(H)  AST 15 - 41 U/L 15  ALT 0 - 44 U/L 13    Discharge instruction: per After  Visit Summary and "Baby and Me Booklet".  After visit meds:  Allergies as of 03/26/2019   No Known Allergies     Medication List    STOP taking these medications   Blood Pressure Monitor Misc   fluticasone 50 MCG/ACT nasal spray Commonly known as: FLONASE     TAKE these medications   citalopram 40 MG tablet Commonly known as: CELEXA Take 1 tablet (40 mg total) by mouth daily.   diphenhydrAMINE 25 MG tablet Commonly known as: BENADRYL Take 25 mg by mouth every 6 (six) hours as needed.   ferrous sulfate 325 (65 FE) MG tablet Take 1 tablet (325 mg total) by mouth 2 (two) times daily with a meal.   ibuprofen 600 MG tablet Commonly known as: ADVIL Take 1 tablet (600 mg total) by mouth every 6 (six) hours. Start taking on: March 27, 2019   omeprazole 20 MG capsule Commonly known as: PRILOSEC Take 1 capsule (20 mg total) by mouth daily.   PRENATAL VITAMIN PO Take by mouth.       Diet: routine diet  Activity: Advance as tolerated. Pelvic rest for 6 weeks.   Outpatient follow up:4 weeks Follow up Appt:No future appointments. Follow up Visit: Follow-up Information    [facility] Follow up.   Specialty: Obstetrics and Gynecology Why: In 4 weeks for a  postpartum appointment Contact information: [address] [place] [phone]          Please schedule this patient for PP visit in: 4 weeks Low risk pregnancy complicated by: n/a Delivery mode:  SVD Anticipated Birth Control:  IUD postplacental PP Procedures needed: BP check  Schedule Integrated [place] visit: no Provider: Any provider   Newborn Data: Live born female    APGAR: 73, 25  Newborn Delivery   Birth date/time: 03/25/2019 17:03:00 Delivery type: Vaginal, Spontaneous      Baby Feeding: Bottle Disposition:home with mother   Wende Mott, Mallory Shirk 03/26/19 7:15 PM

## 2019-03-25 NOTE — Anesthesia Procedure Notes (Signed)
Epidural Patient location during procedure: OB Start time: 03/25/2019 1:16 PM End time: 03/25/2019 1:28 PM  Staffing Anesthesiologist: Lynda Rainwater, MD Performed: anesthesiologist   Preanesthetic Checklist Completed: patient identified, site marked, surgical consent, pre-op evaluation, timeout performed, IV checked, risks and benefits discussed and monitors and equipment checked  Epidural Patient position: sitting Prep: ChloraPrep Patient monitoring: heart rate, cardiac monitor, continuous pulse ox and blood pressure Approach: midline Location: L2-L3 Injection technique: LOR saline  Needle:  Needle type: Tuohy  Needle gauge: 17 G Needle length: 9 cm Needle insertion depth: 9 cm Catheter type: closed end flexible Catheter size: 20 Guage Catheter at skin depth: 15 cm Test dose: negative  Assessment Events: blood not aspirated, injection not painful, no injection resistance, negative IV test and no paresthesia  Additional Notes Reason for block:procedure for pain
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
## 2019-03-25 NOTE — Progress Notes (Signed)
CNM called to bedside for FHR deceleration. Cervix unchanged, attempted FSE unsuccessful. Dr. Ilda Basset called to bedside. IV fluid bolus and position changed. Dr. Ilda Basset placed FSE and FHR recovered to baseline. Will observe for 60-90 minutes and restart pitocin if able.   Wende Mott, CNM 03/25/19

## 2019-03-26 ENCOUNTER — Encounter (HOSPITAL_COMMUNITY): Payer: Self-pay | Admitting: *Deleted

## 2019-03-26 ENCOUNTER — Inpatient Hospital Stay (HOSPITAL_COMMUNITY): Payer: Medicaid Other

## 2019-03-26 ENCOUNTER — Inpatient Hospital Stay (HOSPITAL_COMMUNITY)
Admission: AD | Admit: 2019-03-26 | Payer: Medicaid Other | Source: Home / Self Care | Admitting: Obstetrics & Gynecology

## 2019-03-26 LAB — CBC
HCT: 28 % — ABNORMAL LOW (ref 36.0–46.0)
Hemoglobin: 9.5 g/dL — ABNORMAL LOW (ref 12.0–15.0)
MCH: 32.1 pg (ref 26.0–34.0)
MCHC: 33.9 g/dL (ref 30.0–36.0)
MCV: 94.6 fL (ref 80.0–100.0)
Platelets: 202 10*3/uL (ref 150–400)
RBC: 2.96 MIL/uL — ABNORMAL LOW (ref 3.87–5.11)
RDW: 13.1 % (ref 11.5–15.5)
WBC: 17.1 10*3/uL — ABNORMAL HIGH (ref 4.0–10.5)
nRBC: 0 % (ref 0.0–0.2)

## 2019-03-26 LAB — RPR: RPR Ser Ql: NONREACTIVE

## 2019-03-26 MED ORDER — IBUPROFEN 600 MG PO TABS: 600.0000 mg | ORAL_TABLET | Freq: Four times a day (QID) | ORAL | 0 refills | Status: DC

## 2019-03-26 MED ORDER — FERROUS SULFATE 325 (65 FE) MG PO TABS
325.0000 mg | ORAL_TABLET | Freq: Every day | ORAL | Status: DC
  Administered 2019-03-26: 325 mg via ORAL
  Filled 2019-03-26: qty 1

## 2019-03-26 MED ORDER — OXYCODONE HCL 5 MG PO TABS
5.0000 mg | ORAL_TABLET | ORAL | Status: DC | PRN
  Administered 2019-03-26 (×2): 5 mg via ORAL
  Filled 2019-03-26 (×2): qty 1

## 2019-03-26 NOTE — Anesthesia Postprocedure Evaluation (Signed)
Anesthesia Post Note  Patient: Taylor [name]  Procedure(s) Performed: AN AD [place]     Patient location during evaluation: Mother Baby Anesthesia Type: Epidural Level of consciousness: awake and alert and oriented Pain management: satisfactory to patient Vital Signs Assessment: post-procedure vital signs reviewed and stable Respiratory status: respiratory function stable Cardiovascular status: stable Postop Assessment: no headache, no backache, epidural receding, patient able to bend at knees, no signs of nausea or vomiting and adequate PO intake Anesthetic complications: no    Last Vitals:  Vitals:   03/26/19 0114 03/26/19 0554  BP: 110/62 105/64  Pulse: 78 72  Resp: 18 18  Temp: 36.7 C 36.6 C  SpO2: 99% 97%    Last Pain:  Vitals:   03/26/19 0554  TempSrc: Oral  PainSc: 5    Pain Goal: Patients Stated Pain Goal: 0 (03/25/19 1043)                 Kassius Battiste

## 2019-03-26 NOTE — Progress Notes (Signed)
Post Partum Day 1 Subjective: Patient reports feeling well. She is tolerating PO. Ambulating and urinating without difficulty. Lochia minimal. Bottle feeding. Reports anxiety has been well-controlled and denies thoughts of self harm, depression or harmful thoughts towards baby. Has a 30 year old at home and would like to DC today if possible.  Objective: Blood pressure 110/62, pulse 78, temperature 98.1 F (36.7 C), temperature source Oral, resp. rate 18, height 5\' 4"  (1.626 m), weight 133.6 kg, SpO2 99 %, unknown if currently breastfeeding.  Physical Exam:  General: alert, cooperative, appears stated age and no distress Lochia: appropriate Uterine Fundus: firm DVT Evaluation: No evidence of DVT seen on physical exam.  Recent Labs    03/25/19 1147 03/26/19 0454  HGB 11.6* 9.5*  HCT 32.8* 28.0*    Assessment/Plan: Plan for discharge tomorrow; okay for a late DC if baby cleared for discharge after 24 hours PP IUD placed Ferrous sulfate initiated Anxiety and Depression well-controlled Bottle feeding One elevated BP on admission otherwise all WNL since then   LOS: 1 day   Taylor [name] N Israa Caban 03/26/2019, 5:47 AM

## 2019-03-26 NOTE — Progress Notes (Signed)
[facility] [facility]  03/26/2019  Taylor [name] 1990/12/07 [id number]  CSW received consult for hx of Anxiety and Depression.  CSW met with MOB to offer support and complete assessment.  MOB denies experiencing active symptoms and admitted to taking antidepressant medication, Citalopram (CELEXA) 40 MG tablet, PO, Daily, exactly as prescribed.  MOB denies need for counseling and supportive services, nor is MOB experiencing suicidal and/or homicidal ideations.  MOB reported having an excellent support system.  CSW provided education regarding the baby blues period vs. perinatal mood disorders, discussed treatment and gave resources for mental health follow up if concerns arise.  CSW recommends self-evaluation during the postpartum time period using the New Mom Checklist from Postpartum Progress and encouraged MOB to contact a medical professional if symptoms are noted at any time.    CSW provided review of Sudden Infant Death Syndrome (SIDS) precautions.  CSW identifies no further need for intervention and no barriers to discharge at this time.  Please re-consult social work if additional needs arise or if patient agrees to counseling services.  Nat Christen, BSW, MSW, LCSW  Licensed Clinical Social Worker [employer]  Cell # [phone]  [name]

## 2019-03-28 ENCOUNTER — Other Ambulatory Visit: Payer: Self-pay | Admitting: Women's Health

## 2019-03-28 MED ORDER — HYDROCHLOROTHIAZIDE 25 MG PO TABS: 25.0000 mg | ORAL_TABLET | Freq: Every day | ORAL | 0 refills | Status: DC

## 2019-03-30 ENCOUNTER — Encounter (HOSPITAL_COMMUNITY): Payer: Self-pay | Admitting: Advanced Practice Midwife

## 2019-03-30 ENCOUNTER — Encounter: Payer: Self-pay | Admitting: Advanced Practice Midwife

## 2019-03-30 HISTORY — PX: IUD INSERTION: OBO1003

## 2019-03-30 NOTE — Procedures (Signed)
Post-Placental IUD Insertion Procedure Note  Patient identified, informed consent signed prior to delivery, signed copy in chart, time out was performed.    Vaginal, labial and perineal areas thoroughly inspected for lacerations. 2nd degree laceration identified - not hemostatic,repaired by Len Blalock, CNM, prior to insertion of IUD.  Liletta  - IUD grasped between sterile gloved fingers. Sterile lubrication applied to sterile gloved hand for ease of insertion. Fundus identified through abdominal wall using non-insertion hand. IUD inserted to fundus with bimanual technique. IUD carefully released at the fundus and insertion hand gently removed from vagina.     Strings trimmed to the level of the introitus. Patient tolerated procedure well.   Patient given post procedure instructions and IUD care card with expiration date.  Patient is asked to keep IUD strings tucked in her vagina until her postpartum follow up visit in 4-6 weeks. Patient advised to abstain from sexual intercourse and pulling on strings before her follow-up visit. Patient verbalized an understanding of the plan of care and agrees.

## 2019-03-31 ENCOUNTER — Other Ambulatory Visit: Payer: Self-pay | Admitting: Advanced Practice Midwife

## 2019-03-31 ENCOUNTER — Telehealth: Payer: Medicaid Other | Admitting: *Deleted

## 2019-03-31 MED ORDER — TRAMADOL HCL 50 MG PO TABS: 50.0000 mg | ORAL_TABLET | Freq: Four times a day (QID) | ORAL | 0 refills | Status: DC | PRN

## 2019-03-31 NOTE — Progress Notes (Signed)
Tramadol 50#15 sent to pharmacy for postpartum cramps.  Pt states that she took suboxone for 2 months last year for pain and not replacement therapy. Has appt 11/23 d/t excessive postpartum pain, advised to go to [facility]U if develops fever this weekend.

## 2019-04-03 ENCOUNTER — Other Ambulatory Visit: Payer: Self-pay

## 2019-04-03 ENCOUNTER — Telehealth (INDEPENDENT_AMBULATORY_CARE_PROVIDER_SITE_OTHER): Payer: Medicaid Other | Admitting: *Deleted

## 2019-04-03 VITALS — BP 121/86 | HR 90

## 2019-04-03 DIAGNOSIS — Z0130 Encounter for examination of blood pressure without abnormal findings: Secondary | ICD-10-CM

## 2019-04-03 NOTE — Progress Notes (Signed)
Patient was not able to check her blood pressure at the time of her appt.  Rescheduled.

## 2019-04-03 NOTE — Progress Notes (Addendum)
   NURSE VISIT- BLOOD PRESSURE CHECK  SUBJECTIVE:  Taylor [name] is a 27 y.o. G71P2002 female here for BP check. She is postpartum, delivery date 03/25/2019    HYPERTENSION ROS:  Pregnant/postpartum:  . Severe headaches that don't go away with tylenol/other medicines: No  . Visual changes (seeing spots/double/blurred vision) No  . Severe pain under right breast breast or in center of upper chest No  . Severe nausea/vomiting No  . Taking medicines as instructed not applicable   OBJECTIVE:  BP 121/86 (BP Location: Left Arm, Patient Position: Sitting, Cuff Size: Normal)   Pulse 90   Appearance alert, well appearing, and in no distress.  ASSESSMENT: Postpartum  blood pressure check  PLAN: Routed note to: [name], CNM, [facility]   Recommendations: no changes needed   Follow-up: as scheduled   Rash, [name]  04/03/2019 10:11 AM   Chart reviewed for nurse visit. Agree with plan of care.  Roma Schanz, [place] 04/03/2019 11:07 AM

## 2019-04-10 ENCOUNTER — Other Ambulatory Visit: Payer: Self-pay

## 2019-04-14 ENCOUNTER — Ambulatory Visit: Payer: Medicaid Other | Admitting: Family

## 2019-04-27 ENCOUNTER — Ambulatory Visit (INDEPENDENT_AMBULATORY_CARE_PROVIDER_SITE_OTHER): Payer: Medicaid Other | Admitting: Advanced Practice Midwife

## 2019-04-27 ENCOUNTER — Encounter: Payer: Self-pay | Admitting: Advanced Practice Midwife

## 2019-04-27 ENCOUNTER — Other Ambulatory Visit: Payer: Self-pay

## 2019-04-27 DIAGNOSIS — Z975 Presence of (intrauterine) contraceptive device: Secondary | ICD-10-CM

## 2019-04-27 DIAGNOSIS — Z1389 Encounter for screening for other disorder: Secondary | ICD-10-CM

## 2019-04-27 NOTE — Progress Notes (Signed)
Taylor [name] is a 28 y.o. who presents for a postpartum visit. She is 4 weeks postpartum following a spontaneous vaginal delivery. I have fully reviewed the prenatal and intrapartum course. The delivery was at 40.6 gestational weeks.  Anesthesia: epidural. Postpartum course has been uneventful. She had a . Baby's course has been uneventful. Baby is feeding by bottle. Bleeding: no bleeding. Bowel function is normal. Bladder function is normal. Patient is not sexually active. Contraception method is IUD. Postpartum depression screening: negative.   Current Outpatient Medications:  .  citalopram (CELEXA) 40 MG tablet, Take 1 tablet (40 mg total) by mouth daily., Disp: 90 tablet, Rfl: 4 .  diphenhydrAMINE (BENADRYL) 25 MG tablet, Take 25 mg by mouth every 6 (six) hours as needed., Disp: , Rfl:  .  ferrous sulfate 325 (65 FE) MG tablet, Take 1 tablet (325 mg total) by mouth 2 (two) times daily with a meal., Disp: 60 tablet, Rfl: 3 .  hydrochlorothiazide (HYDRODIURIL) 25 MG tablet, Take 1 tablet (25 mg total) by mouth daily., Disp: 10 tablet, Rfl: 0 .  ibuprofen (ADVIL) 600 MG tablet, Take 1 tablet (600 mg total) by mouth every 6 (six) hours., Disp: 30 tablet, Rfl: 0 .  omeprazole (PRILOSEC) 20 MG capsule, Take 1 capsule (20 mg total) by mouth daily., Disp: 90 capsule, Rfl: 3 .  Prenatal Vit-Fe Fumarate-FA (PRENATAL VITAMIN PO), Take by mouth., Disp: , Rfl:  .  traMADol (ULTRAM) 50 MG tablet, Take 1 tablet (50 mg total) by mouth every 6 (six) hours as needed., Disp: 15 tablet, Rfl: 0  Review of Systems   Constitutional: Negative for fever and chills Eyes: Negative for visual disturbances Respiratory: Negative for shortness of breath, dyspnea Cardiovascular: Negative for chest pain or palpitations  Gastrointestinal: Negative for vomiting, diarrhea and constipation Genitourinary: Negative for dysuria and urgency Musculoskeletal: Negative for back pain, joint pain, myalgias  Neurological: Negative  for dizziness and headaches    Objective:    There were no vitals filed for this visit. 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 Bedside US reveals properly placed IUD  Cervix:  closed  Corpus: Well involuted     Rectal Exam: no hemorrhoids        Assessment:    normal postpartum exam.  Plan:   1. Contraception: IUD 2. Follow up in: in 2 weeks if needs another TCA tx  or as needed.

## 2019-04-28 ENCOUNTER — Ambulatory Visit (INDEPENDENT_AMBULATORY_CARE_PROVIDER_SITE_OTHER): Payer: Medicaid Other | Admitting: Family

## 2019-04-28 ENCOUNTER — Encounter: Payer: Self-pay | Admitting: Family

## 2019-04-28 VITALS — BP 132/88 | HR 88 | Temp 98.4°F | Ht 64.0 in | Wt 268.0 lb

## 2019-04-28 DIAGNOSIS — F411 Generalized anxiety disorder: Secondary | ICD-10-CM

## 2019-04-28 DIAGNOSIS — F321 Major depressive disorder, single episode, moderate: Secondary | ICD-10-CM

## 2019-04-28 DIAGNOSIS — F17200 Nicotine dependence, unspecified, uncomplicated: Secondary | ICD-10-CM | POA: Diagnosis not present

## 2019-04-28 DIAGNOSIS — M67432 Ganglion, left wrist: Secondary | ICD-10-CM

## 2019-04-28 MED ORDER — BUSPIRONE HCL 5 MG PO TABS: 5.0000 mg | ORAL_TABLET | Freq: Three times a day (TID) | ORAL | 1 refills | Status: DC | PRN

## 2019-04-28 MED ORDER — CITALOPRAM HYDROBROMIDE 40 MG PO TABS: 40.0000 mg | ORAL_TABLET | Freq: Every day | ORAL | 4 refills | Status: DC
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
## 2019-04-28 NOTE — Patient Instructions (Signed)
Ganglion Cyst  A ganglion cyst is a non-cancerous, fluid-filled lump that occurs near a joint or tendon. The cyst grows out of a joint or the lining of a tendon. Ganglion cysts most often develop in the hand or wrist, but they can also develop in the shoulder, elbow, hip, knee, ankle, or foot. Ganglion cysts are ball-shaped or egg-shaped. Their size can range from the size of a pea to larger than a grape. Increased activity may cause the cyst to get bigger because more fluid starts to build up. What are the causes? The exact cause of this condition is not known, but it may be related to:  Inflammation or irritation around the joint.  An injury.  Repetitive movements or overuse.  Arthritis. What increases the risk? You are more likely to develop this condition if:  You are a woman.  You are 40-59 years old. What are the signs or symptoms? The main symptom of this condition is a lump. It most often appears on the hand or wrist. In many cases, there are no other symptoms, but a cyst can sometimes cause:  Tingling.  Pain.  Numbness.  Muscle weakness.  Weak grip.  Less range of motion in a joint. How is this diagnosed? Ganglion cysts are usually diagnosed based on a physical exam. Your health care provider will feel the lump and may shine a light next to it. If it is a ganglion cyst, the light will likely shine through it. Your health care provider may order an X-ray, ultrasound, or MRI to rule out other conditions. How is this treated? Ganglion cysts often go away on their own without treatment. If you have pain or other symptoms, treatment may be needed. Treatment is also needed if the ganglion cyst limits your movement or if it gets infected. Treatment may include:  Wearing a brace or splint on your wrist or finger.  Taking anti-inflammatory medicine.  Having fluid drained from the lump with a needle (aspiration).  Getting a steroid injected into the joint.  Having  surgery to remove the ganglion cyst.  Placing a pad on your shoe or wearing shoes that will not rub against the cyst if it is on your foot. Follow these instructions at home:  Do not press on the ganglion cyst, poke it with a needle, or hit it.  Take over-the-counter and prescription medicines only as told by your health care provider.  If you have a brace or splint: ? Wear it as told by your health care provider. ? Remove it as told by your health care provider. Ask if you need to remove it when you take a shower or a bath.  Watch your ganglion cyst for any changes.  Keep all follow-up visits as told by your health care provider. This is important. Contact a health care provider if:  Your ganglion cyst becomes larger or more painful.  You have pus coming from the lump.  You have weakness or numbness in the affected area.  You have a fever or chills. Get help right away if:  You have a fever and have any of these in the cyst area: ? Increased redness. ? Red streaks. ? Swelling. Summary  A ganglion cyst is a non-cancerous, fluid-filled lump that occurs near a joint or tendon.  Ganglion cysts most often develop in the hand or wrist, but they can also develop in the shoulder, elbow, hip, knee, ankle, or foot.  Ganglion cysts often go away on their own without treatment.  This information is not intended to replace advice given to you by your health care provider. Make sure you discuss any questions you have with your health care provider. Document Released: 04/24/2000 Document Revised: 04/09/2017 Document Reviewed: 12/25/2016 Elsevier Patient Education  2020 [employer].

## 2019-05-09 ENCOUNTER — Ambulatory Visit (INDEPENDENT_AMBULATORY_CARE_PROVIDER_SITE_OTHER): Payer: Medicaid Other | Admitting: Family Medicine

## 2019-05-09 DIAGNOSIS — D72829 Elevated white blood cell count, unspecified: Secondary | ICD-10-CM

## 2019-05-09 DIAGNOSIS — D649 Anemia, unspecified: Secondary | ICD-10-CM | POA: Diagnosis not present
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
## 2019-05-19 ENCOUNTER — Other Ambulatory Visit: Payer: Self-pay

## 2019-05-19 ENCOUNTER — Other Ambulatory Visit: Payer: Medicaid Other

## 2019-05-19 DIAGNOSIS — D649 Anemia, unspecified: Secondary | ICD-10-CM | POA: Diagnosis not present

## 2019-05-19 DIAGNOSIS — D72829 Elevated white blood cell count, unspecified: Secondary | ICD-10-CM

## 2019-05-20 LAB — CMP14+EGFR
ALT: 31 IU/L (ref 0–32)
AST: 22 IU/L (ref 0–40)
Albumin/Globulin Ratio: 2 (ref 1.2–2.2)
Albumin: 4.7 g/dL (ref 3.9–5.0)
Alkaline Phosphatase: 108 IU/L (ref 39–117)
BUN/Creatinine Ratio: 25 — ABNORMAL HIGH (ref 9–23)
BUN: 14 mg/dL (ref 6–20)
Bilirubin Total: <0.2 mg/dL (ref 0.0–1.2)
CO2: 23 mmol/L (ref 20–29)
Calcium: 9.9 mg/dL (ref 8.7–10.2)
Chloride: 102 mmol/L (ref 96–106)
Creatinine, Ser: 0.56 mg/dL — ABNORMAL LOW (ref 0.57–1.00)
GFR calc Af Amer: 145 mL/min/{1.73_m2} (ref >59)
GFR calc non Af Amer: 126 mL/min/{1.73_m2} (ref >59)
Globulin, Total: 2.3 g/dL (ref 1.5–4.5)
Glucose: 94 mg/dL (ref 65–99)
Potassium: 4.3 mmol/L (ref 3.5–5.2)
Sodium: 139 mmol/L (ref 134–144)
Total Protein: 7 g/dL (ref 6.0–8.5)

## 2019-05-20 LAB — CBC WITH DIFFERENTIAL/PLATELET
Basophils Absolute: 0.1 10*3/uL (ref 0.0–0.2)
Basos: 1 %
EOS (ABSOLUTE): 0.3 10*3/uL (ref 0.0–0.4)
Eos: 2 %
Hematocrit: 41.1 % (ref 34.0–46.6)
Hemoglobin: 13.9 g/dL (ref 11.1–15.9)
Immature Grans (Abs): 0 10*3/uL (ref 0.0–0.1)
Immature Granulocytes: 0 %
Lymphocytes Absolute: 2.4 10*3/uL (ref 0.7–3.1)
Lymphs: 21 %
MCH: 31 pg (ref 26.6–33.0)
MCHC: 33.8 g/dL (ref 31.5–35.7)
MCV: 92 fL (ref 79–97)
Monocytes Absolute: 0.5 10*3/uL (ref 0.1–0.9)
Monocytes: 5 %
Neutrophils Absolute: 8 10*3/uL — ABNORMAL HIGH (ref 1.4–7.0)
Neutrophils: 71 %
Platelets: 277 10*3/uL (ref 150–450)
RBC: 4.49 x10E6/uL (ref 3.77–5.28)
RDW: 12.2 % (ref 11.7–15.4)
WBC: 11.2 10*3/uL — ABNORMAL HIGH (ref 3.4–10.8)

## 2019-05-20 LAB — FERRITIN: Ferritin: 30 ng/mL (ref 15–150)

## 2019-06-02 ENCOUNTER — Ambulatory Visit (INDEPENDENT_AMBULATORY_CARE_PROVIDER_SITE_OTHER): Payer: Medicaid Other | Admitting: Family Medicine

## 2019-06-02 ENCOUNTER — Other Ambulatory Visit: Payer: Self-pay

## 2019-06-02 ENCOUNTER — Encounter: Payer: Self-pay | Admitting: Family Medicine

## 2019-06-02 ENCOUNTER — Ambulatory Visit (INDEPENDENT_AMBULATORY_CARE_PROVIDER_SITE_OTHER): Payer: Medicaid Other

## 2019-06-02 VITALS — BP 116/81 | HR 76 | Temp 98.4°F | Ht 64.0 in | Wt 264.2 lb

## 2019-06-02 DIAGNOSIS — R10813 Right lower quadrant abdominal tenderness: Secondary | ICD-10-CM | POA: Diagnosis not present

## 2019-06-02 DIAGNOSIS — R319 Hematuria, unspecified: Secondary | ICD-10-CM | POA: Diagnosis not present

## 2019-06-02 DIAGNOSIS — R109 Unspecified abdominal pain: Secondary | ICD-10-CM

## 2019-06-02 LAB — URINALYSIS, COMPLETE
Bilirubin, UA: NEGATIVE
Glucose, UA: NEGATIVE
Ketones, UA: NEGATIVE
Leukocytes,UA: NEGATIVE
Nitrite, UA: NEGATIVE
Protein,UA: NEGATIVE
Specific Gravity, UA: 1.02 (ref 1.005–1.030)
Urobilinogen, Ur: 0.2 mg/dL (ref 0.2–1.0)
pH, UA: 5.5 (ref 5.0–7.5)

## 2019-06-02 LAB — MICROSCOPIC EXAMINATION
Epithelial Cells (non renal): >10 /hpf — AB (ref 0–10)
Renal Epithel, UA: NONE SEEN /hpf

## 2019-06-02 MED ORDER — TAMSULOSIN HCL 0.4 MG PO CAPS: 0.4000 mg | ORAL_CAPSULE | Freq: Every day | ORAL | 0 refills | Status: DC

## 2019-06-02 MED ORDER — HYDROCODONE-ACETAMINOPHEN 5-325 MG PO TABS: 1.0000 | ORAL_TABLET | Freq: Four times a day (QID) | ORAL | 0 refills | Status: DC | PRN

## 2019-06-02 MED ORDER — CEPHALEXIN 500 MG PO CAPS: 500.0000 mg | ORAL_CAPSULE | Freq: Four times a day (QID) | ORAL | 0 refills | Status: AC

## 2019-06-02 NOTE — Telephone Encounter (Signed)
Pt is coming in to be seen at 4:05 today. She has bleed in urine and has pain. She thinks it might be a kidney stone. Do not need to call back.

## 2019-06-02 NOTE — Patient Instructions (Signed)
I am going to empirically treat you for both infection and a presumed kidney stone given your exquisite tenderness to palpation over the kidney on the right.  I reviewed your x-ray I did not see any evidence of a stone but I am waiting for the radiologist to formally review this.  If symptoms worsen or do not improve, I want you reevaluated.  May need to consider CAT scan if symptoms are getting worse   Urinary Tract Infection, Adult A urinary tract infection (UTI) is an infection of any part of the urinary tract. The urinary tract includes:  The kidneys.  The ureters.  The bladder.  The urethra. These organs make, store, and get rid of pee (urine) in the body. What are the causes? This is caused by germs (bacteria) in your genital area. These germs grow and cause swelling (inflammation) of your urinary tract. What increases the risk? You are more likely to develop this condition if:  You have a small, thin tube (catheter) to drain pee.  You cannot control when you pee or poop (incontinence).  You are female, and: ? You use these methods to prevent pregnancy:  A medicine that kills sperm (spermicide).  A device that blocks sperm (diaphragm). ? You have low levels of a female hormone (estrogen). ? You are pregnant.  You have genes that add to your risk.  You are sexually active.  You take antibiotic medicines.  You have trouble peeing because of: ? A prostate that is bigger than normal, if you are female. ? A blockage in the part of your body that drains pee from the bladder (urethra). ? A kidney stone. ? A nerve condition that affects your bladder (neurogenic bladder). ? Not getting enough to drink. ? Not peeing often enough.  You have other conditions, such as: ? Diabetes. ? A weak disease-fighting system (immune system). ? Sickle cell disease. ? Gout. ? Injury of the spine. What are the signs or symptoms? Symptoms of this condition include:  Needing to pee  right away (urgently).  Peeing often.  Peeing small amounts often.  Pain or burning when peeing.  Blood in the pee.  Pee that smells bad or not like normal.  Trouble peeing.  Pee that is cloudy.  Fluid coming from the vagina, if you are female.  Pain in the belly or lower back. Other symptoms include:  Throwing up (vomiting).  No urge to eat.  Feeling mixed up (confused).  Being tired and grouchy (irritable).  A fever.  Watery poop (diarrhea). How is this treated? This condition may be treated with:  Antibiotic medicine.  Other medicines.  Drinking enough water. Follow these instructions at home:  Medicines  Take over-the-counter and prescription medicines only as told by your doctor.  If you were prescribed an antibiotic medicine, take it as told by your doctor. Do not stop taking it even if you start to feel better. General instructions  Make sure you: ? Pee until your bladder is empty. ? Do not hold pee for a long time. ? Empty your bladder after sex. ? Wipe from front to back after pooping if you are a female. Use each tissue one time when you wipe.  Drink enough fluid to keep your pee pale yellow.  Keep all follow-up visits as told by your doctor. This is important. Contact a doctor if:  You do not get better after 1-2 days.  Your symptoms go away and then come back. Get help right away if:  You have very bad back pain.  You have very bad pain in your lower belly.  You have a fever.  You are sick to your stomach (nauseous).  You are throwing up. Summary  A urinary tract infection (UTI) is an infection of any part of the urinary tract.  This condition is caused by germs in your genital area.  There are many risk factors for a UTI. These include having a small, thin tube to drain pee and not being able to control when you pee or poop.  Treatment includes antibiotic medicines for germs.  Drink enough fluid to keep your pee pale  yellow. This information is not intended to replace advice given to you by your health care provider. Make sure you discuss any questions you have with your health care provider. Document Revised: 04/14/2018 Document Reviewed: 11/04/2017 Elsevier Patient Education  2020 [place].  Kidney Stones Kidney stones are rock-like masses that form inside of the kidneys. Kidneys are organs that make pee (urine). A kidney stone may move into other parts of the urinary tract, including:  The tubes that connect the kidneys to the bladder (ureters).  The bladder.  The tube that carries urine out of the body (urethra). Kidney stones can cause very bad pain and can block the flow of pee. The stone usually leaves your body (passes) through your pee. You may need to have a doctor take out the stone. What are the causes? Kidney stones may be caused by:  A condition in which certain glands make too much parathyroid hormone (primary hyperparathyroidism).  A buildup of a type of crystals in the bladder made of a chemical called uric acid. The body makes uric acid when you eat certain foods.  Narrowing (stricture) of one or both of the ureters.  A kidney blockage that you were born with.  Past surgery on the kidney or the ureters, such as gastric bypass surgery. What increases the risk? You are more likely to develop this condition if:  You have had a kidney stone in the past.  You have a family history of kidney stones.  You do not drink enough water.  You eat a diet that is high in protein, salt (sodium), or sugar.  You are overweight or very overweight (obese). What are the signs or symptoms? Symptoms of a kidney stone may include:  Pain in the side of the belly, right below the ribs (flank pain). Pain usually spreads (radiates) to the groin.  Needing to pee often or right away (urgently).  Pain when going pee (urinating).  Blood in your pee (hematuria).  Feeling like you may vomit  (nauseous).  Vomiting.  Fever and chills. How is this treated? Treatment depends on the size, location, and makeup of the kidney stones. The stones will often pass out of the body through peeing. You may need to:  Drink more fluid to help pass the stone. In some cases, you may be given fluids through an IV tube put into one of your veins at the hospital.  Take medicine for pain.  Make changes in your diet to help keep kidney stones from coming back. Sometimes, medical procedures are needed to remove a kidney stone. This may involve:  A procedure to break up kidney stones using a beam of light (laser) or shock waves.  Surgery to remove the kidney stones. Follow these instructions at home: Medicines  Take over-the-counter and prescription medicines only as told by your doctor.  Ask your doctor  if the medicine prescribed to you requires you to avoid driving or using heavy machinery. Eating and drinking  Drink enough fluid to keep your pee pale yellow. You may be told to drink at least 8-10 glasses of water each day. This will help you pass the stone.  If told by your doctor, change your diet. This may include: ? Limiting how much salt you eat. ? Eating more fruits and vegetables. ? Limiting how much meat, poultry, fish, and eggs you eat.  Follow instructions from your doctor about eating or drinking restrictions. General instructions  Collect pee samples as told by your doctor. You may need to collect a pee sample: ? 24 hours after a stone comes out. ? 8-12 weeks after a stone comes out, and every 6-12 months after that.  Strain your pee every time you pee (urinate), for as long as told. Use the strainer that your doctor recommends.  Do not throw out the stone. Keep it so that it can be tested by your doctor.  Keep all follow-up visits as told by your doctor. This is important. You may need follow-up tests. How is this prevented? To prevent another kidney stone:  Drink  enough fluid to keep your pee pale yellow. This is the best way to prevent kidney stones.  Eat healthy foods.  Avoid certain foods as told by your doctor. You may be told to eat less protein.  Stay at a healthy weight. Where to find more information  [place] ([facility]): www.kidney.[place] [facility]): www.urologyhealth.org Contact a doctor if:  You have pain that gets worse or does not get better with medicine. Get help right away if:  You have a fever or chills.  You get very bad pain.  You get new pain in your belly (abdomen).  You pass out (faint).  You cannot pee. Summary  Kidney stones are rock-like masses that form inside of the kidneys.  Kidney stones can cause very bad pain and can block the flow of pee.  The stones will often pass out of the body through peeing.  Drink enough fluid to keep your pee pale yellow. This information is not intended to replace advice given to you by your health care provider. Make sure you discuss any questions you have with your health care provider. Document Revised: 09/13/2018 Document Reviewed: 09/13/2018 Elsevier Patient Education  [place].
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
## 2019-06-06 ENCOUNTER — Other Ambulatory Visit: Payer: Self-pay | Admitting: Family

## 2019-06-06 DIAGNOSIS — G8929 Other chronic pain: Secondary | ICD-10-CM

## 2019-06-06 MED ORDER — BUSPIRONE HCL 15 MG PO TABS: 15.0000 mg | ORAL_TABLET | Freq: Three times a day (TID) | ORAL | 2 refills | Status: DC

## 2019-06-07 LAB — URINE CULTURE

## 2019-06-13 ENCOUNTER — Encounter: Payer: Self-pay | Admitting: Family Medicine

## 2019-06-13 ENCOUNTER — Other Ambulatory Visit: Payer: Self-pay | Admitting: Family

## 2019-06-13 MED ORDER — SULFAMETHOXAZOLE-TRIMETHOPRIM 800-160 MG PO TABS: 1.0000 | ORAL_TABLET | Freq: Two times a day (BID) | ORAL | 0 refills | Status: DC

## 2019-06-28 ENCOUNTER — Other Ambulatory Visit: Payer: Self-pay

## 2019-06-29 ENCOUNTER — Ambulatory Visit (INDEPENDENT_AMBULATORY_CARE_PROVIDER_SITE_OTHER): Payer: Medicaid Other | Admitting: Family

## 2019-06-29 ENCOUNTER — Encounter: Payer: Self-pay | Admitting: Family

## 2019-06-29 DIAGNOSIS — G8929 Other chronic pain: Secondary | ICD-10-CM | POA: Diagnosis not present

## 2019-06-29 DIAGNOSIS — M5441 Lumbago with sciatica, right side: Secondary | ICD-10-CM | POA: Diagnosis not present

## 2019-06-29 MED ORDER — GABAPENTIN 300 MG PO CAPS: 300.0000 mg | ORAL_CAPSULE | Freq: Three times a day (TID) | ORAL | 3 refills | Status: DC

## 2019-06-29 MED ORDER — BACLOFEN 10 MG PO TABS: 10.0000 mg | ORAL_TABLET | Freq: Three times a day (TID) | ORAL | 0 refills | Status: DC

## 2019-06-29 NOTE — Progress Notes (Signed)
Virtual Visit via telephone Note Due to COVID-19 pandemic this visit was conducted virtually. This visit type was conducted due to national recommendations for restrictions regarding the COVID-19 Pandemic (e.g. social distancing, sheltering in place) in an effort to limit this patient's exposure and mitigate transmission in our community. All issues noted in this document were discussed and addressed.  A physical exam was not performed with this format.  I connected with Taylor [name] on 06/29/19 at 12:38 pm by telephone and verified that I am speaking with the correct person using two identifiers. Taylor [name] is currently located at home and no one is currently with her during visit. The provider, Evelina Dun, FNP is located in their office at time of visit.  I discussed the limitations, risks, security and privacy concerns of performing an evaluation and management service by telephone and the availability of in person appointments. I also discussed with the patient that there may be a patient responsible charge related to this service. The patient expressed understanding and agreed to proceed.   History and Present Illness:  Back Pain This is a recurrent problem. The current episode started more than 1 month ago. The problem occurs intermittently. The problem has been waxing and waning since onset. The pain is present in the lumbar spine and gluteal. The quality of the pain is described as aching, stabbing and shooting. The pain radiates to the right thigh. The pain is at a severity of 8/10. The pain is moderate. The symptoms are aggravated by bending and twisting. Associated symptoms include leg pain, tingling and weakness. Pertinent negatives include no bladder incontinence, bowel incontinence or weight loss. She has tried NSAIDs for the symptoms. The treatment provided mild relief.      Review of Systems  Constitutional: Negative for weight loss.  Gastrointestinal: Negative for  bowel incontinence.  Genitourinary: Negative for bladder incontinence.  Musculoskeletal: Positive for back pain.  Neurological: Positive for tingling and weakness.  All other systems reviewed and are negative.    Observations/Objective: No SOB or distress noted  Assessment and Plan: Taylor [name] comes in today with chief complaint of No chief complaint on file.   Diagnosis and orders addressed:  1. Chronic right-sided low back pain with right-sided sciatica Rest Heat ROM exercises encouraged MRI pending Pain management referral pending Continue NSAID's Will start gabapentin 300 mg TID prn and baclofen  10 mg TID prn Call if pain worsens or does not improve  - MR Lumbar Spine Wo Contrast; Future - gabapentin (NEURONTIN) 300 MG capsule; Take 1 capsule (300 mg total) by mouth 3 (three) times daily.  Dispense: 90 capsule; Refill: 3 - baclofen (LIORESAL) 10 MG tablet; Take 1 tablet (10 mg total) by mouth 3 (three) times daily.  Dispense: 30 each; Refill: 0      I discussed the assessment and treatment plan with the patient. The patient was provided an opportunity to ask questions and all were answered. The patient agreed with the plan and demonstrated an understanding of the instructions.   The patient was advised to call back or seek an in-person evaluation if the symptoms worsen or if the condition fails to improve as anticipated.  The above assessment and management plan was discussed with the patient. The patient verbalized understanding of and has agreed to the management plan. Patient is aware to call the clinic if symptoms persist or worsen. Patient is aware when to return to the clinic for a follow-up visit. Patient educated on when it is appropriate  to go to the emergency department.   Time call ended:  12:52 pm   I provided 14 minutes of non-face-to-face time during this encounter.    Evelina Dun, FNP

## 2019-07-03 ENCOUNTER — Telehealth: Payer: Self-pay | Admitting: Family

## 2019-07-03 DIAGNOSIS — G8929 Other chronic pain: Secondary | ICD-10-CM

## 2019-07-03 DIAGNOSIS — M549 Dorsalgia, unspecified: Secondary | ICD-10-CM

## 2019-07-03 NOTE — Telephone Encounter (Signed)
REFERRAL REQUEST Telephone Note  What type of referral do you need? Pain Management  Have you been seen at our office for this problem? Yes (Advise that they will likely need an appointment with their PCP before a referral can be done)  Is there a particular doctor or location that you prefer? [place] - Patient has a Engineer, drilling at [facility] and can not schedule an appt.  Patient notified that referrals can take up to a week or longer to process. If they haven't heard anything within a week they should call back and speak with the referral department.

## 2019-07-03 NOTE — Telephone Encounter (Signed)
Referral placed.

## 2019-07-04 ENCOUNTER — Other Ambulatory Visit: Payer: Self-pay | Admitting: Family

## 2019-07-04 ENCOUNTER — Ambulatory Visit: Payer: Medicaid Other | Admitting: Family

## 2019-07-04 DIAGNOSIS — M5440 Lumbago with sciatica, unspecified side: Secondary | ICD-10-CM

## 2019-07-04 DIAGNOSIS — G8929 Other chronic pain: Secondary | ICD-10-CM

## 2019-07-08 ENCOUNTER — Encounter (HOSPITAL_COMMUNITY): Payer: Self-pay | Admitting: Emergency Medicine

## 2019-07-08 ENCOUNTER — Emergency Department (HOSPITAL_COMMUNITY)
Admission: EM | Admit: 2019-07-08 | Discharge: 2019-07-08 | Disposition: A | Discharge status: Home / Self Care | Payer: Medicaid Other | Attending: Emergency Medicine | Admitting: Emergency Medicine

## 2019-07-08 ENCOUNTER — Emergency Department (HOSPITAL_COMMUNITY): Payer: Medicaid Other

## 2019-07-08 ENCOUNTER — Other Ambulatory Visit: Payer: Self-pay

## 2019-07-08 DIAGNOSIS — Y9389 Activity, other specified: Secondary | ICD-10-CM | POA: Insufficient documentation

## 2019-07-08 DIAGNOSIS — X509XXA Other and unspecified overexertion or strenuous movements or postures, initial encounter: Secondary | ICD-10-CM | POA: Diagnosis not present

## 2019-07-08 DIAGNOSIS — Y92008 Other place in unspecified non-institutional (private) residence as the place of occurrence of the external cause: Secondary | ICD-10-CM | POA: Insufficient documentation

## 2019-07-08 DIAGNOSIS — F17210 Nicotine dependence, cigarettes, uncomplicated: Secondary | ICD-10-CM | POA: Diagnosis not present

## 2019-07-08 DIAGNOSIS — Z79899 Other long term (current) drug therapy: Secondary | ICD-10-CM | POA: Insufficient documentation

## 2019-07-08 DIAGNOSIS — Y999 Unspecified external cause status: Secondary | ICD-10-CM | POA: Diagnosis not present

## 2019-07-08 DIAGNOSIS — S92355A Nondisplaced fracture of fifth metatarsal bone, left foot, initial encounter for closed fracture: Secondary | ICD-10-CM | POA: Diagnosis not present

## 2019-07-08 DIAGNOSIS — S99922A Unspecified injury of left foot, initial encounter: Secondary | ICD-10-CM | POA: Diagnosis present

## 2019-07-08 MED ORDER — HYDROCODONE-ACETAMINOPHEN 5-325 MG PO TABS: 1.0000 | ORAL_TABLET | Freq: Four times a day (QID) | ORAL | 0 refills | Status: DC | PRN

## 2019-07-08 MED ORDER — NAPROXEN 500 MG PO TABS: 500.0000 mg | ORAL_TABLET | Freq: Two times a day (BID) | ORAL | 0 refills | Status: AC

## 2019-07-08 NOTE — ED Triage Notes (Signed)
Pt reports left foot pain after taking a step. Started last night with no known injury.

## 2019-07-08 NOTE — Discharge Instructions (Signed)
Your x-ray shows that you have a broken bone in your foot.  It is nondisplaced, it probably will not need surgery however given the location of the fracture and your difficulty with walking because of sciatica I would strongly recommend that you follow-up with the orthopedist, use the crutches for walking and try to keep as much weight as you can off of your left foot.  I have given you several days worth of pain medication, I would encourage you to not use this medication unless you absolutely need it.  Please stick to naproxen or ibuprofen and avoid taking hydrocodone unless you have severe uncontrolled pain as this can be addictive and cause constipation and other side effects
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
## 2019-07-10 ENCOUNTER — Other Ambulatory Visit: Payer: Self-pay | Admitting: Family

## 2019-07-10 DIAGNOSIS — G8929 Other chronic pain: Secondary | ICD-10-CM

## 2019-07-10 MED ORDER — GABAPENTIN 600 MG PO TABS: 600.0000 mg | ORAL_TABLET | Freq: Three times a day (TID) | ORAL | 2 refills | Status: DC

## 2019-07-13 ENCOUNTER — Other Ambulatory Visit: Payer: Self-pay

## 2019-07-13 ENCOUNTER — Encounter: Payer: Self-pay | Admitting: Orthopaedic Surgery

## 2019-07-13 ENCOUNTER — Ambulatory Visit: Payer: Medicaid Other | Admitting: Orthopaedic Surgery

## 2019-07-13 VITALS — BP 130/89 | HR 87 | Ht 64.0 in | Wt 253.0 lb

## 2019-07-13 DIAGNOSIS — S92352A Displaced fracture of fifth metatarsal bone, left foot, initial encounter for closed fracture: Secondary | ICD-10-CM

## 2019-07-13 DIAGNOSIS — F17210 Nicotine dependence, cigarettes, uncomplicated: Secondary | ICD-10-CM | POA: Diagnosis not present

## 2019-07-13 MED ORDER — HYDROCODONE-ACETAMINOPHEN 5-325 MG PO TABS: ORAL_TABLET | ORAL | 0 refills | Status: DC
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
## 2019-07-13 NOTE — Patient Instructions (Signed)
Steps to Quit Smoking Smoking tobacco is the leading cause of preventable death. It can affect almost every organ in the body. Smoking puts you and people around you at risk for many serious, long-lasting (chronic) diseases. Quitting smoking can be hard, but it is one of the best things that you can do for your health. It is never too late to quit. How do I get ready to quit? When you decide to quit smoking, make a plan to help you succeed. Before you quit:  Pick a date to quit. Set a date within the next 2 weeks to give you time to prepare.  Write down the reasons why you are quitting. Keep this list in places where you will see it often.  Tell your family, friends, and co-workers that you are quitting. Their support is important.  Talk with your doctor about the choices that may help you quit.  Find out if your health insurance will pay for these treatments.  Know the people, places, things, and activities that make you want to smoke (triggers). Avoid them. What first steps can I take to quit smoking?  Throw away all cigarettes at home, at work, and in your car.  Throw away the things that you use when you smoke, such as ashtrays and lighters.  Clean your car. Make sure to empty the ashtray.  Clean your home, including curtains and carpets. What can I do to help me quit smoking? Talk with your doctor about taking medicines and seeing a counselor at the same time. You are more likely to succeed when you do both.  If you are pregnant or breastfeeding, talk with your doctor about counseling or other ways to quit smoking. Do not take medicine to help you quit smoking unless your doctor tells you to do so. To quit smoking: Quit right away  Quit smoking totally, instead of slowly cutting back on how much you smoke over a period of time.  Go to counseling. You are more likely to quit if you go to counseling sessions regularly. Take medicine You may take medicines to help you quit. Some  medicines need a prescription, and some you can buy over-the-counter. Some medicines may contain a drug called nicotine to replace the nicotine in cigarettes. Medicines may:  Help you to stop having the desire to smoke (cravings).  Help to stop the problems that come when you stop smoking (withdrawal symptoms). Your doctor may ask you to use:  Nicotine patches, gum, or lozenges.  Nicotine inhalers or sprays.  Non-nicotine medicine that is taken by mouth. Find resources Find resources and other ways to help you quit smoking and remain smoke-free after you quit. These resources are most helpful when you use them often. They include:  Online chats with a counselor.  Phone quitlines.  Printed self-help materials.  Support groups or group counseling.  Text messaging programs.  Mobile phone apps. Use apps on your mobile phone or tablet that can help you stick to your quit plan. There are many free apps for mobile phones and tablets as well as websites. Examples include Quit Guide from the CDC and smokefree.gov  What things can I do to make it easier to quit?   Talk to your family and friends. Ask them to support and encourage you.  Call a phone quitline (1-800-QUIT-NOW), reach out to support groups, or work with a counselor.  Ask people who smoke to not smoke around you.  Avoid places that make you want to smoke,   such as: ? Bars. ? Parties. ? Smoke-break areas at work.  Spend time with people who do not smoke.  Lower the stress in your life. Stress can make you want to smoke. Try these things to help your stress: ? Getting regular exercise. ? Doing deep-breathing exercises. ? Doing yoga. ? Meditating. ? Doing a body scan. To do this, close your eyes, focus on one area of your body at a time from head to toe. Notice which parts of your body are tense. Try to relax the muscles in those areas. How will I feel when I quit smoking? Day 1 to 3 weeks Within the first 24 hours,  you may start to have some problems that come from quitting tobacco. These problems are very bad 2-3 days after you quit, but they do not often last for more than 2-3 weeks. You may get these symptoms:  Mood swings.  Feeling restless, nervous, angry, or annoyed.  Trouble concentrating.  Dizziness.  Strong desire for high-sugar foods and nicotine.  Weight gain.  Trouble pooping (constipation).  Feeling like you may vomit (nausea).  Coughing or a sore throat.  Changes in how the medicines that you take for other issues work in your body.  Depression.  Trouble sleeping (insomnia). Week 3 and afterward After the first 2-3 weeks of quitting, you may start to notice more positive results, such as:  Better sense of smell and taste.  Less coughing and sore throat.  Slower heart rate.  Lower blood pressure.  Clearer skin.  Better breathing.  Fewer sick days. Quitting smoking can be hard. Do not give up if you fail the first time. Some people need to try a few times before they succeed. Do your best to stick to your quit plan, and talk with your doctor if you have any questions or concerns. Summary  Smoking tobacco is the leading cause of preventable death. Quitting smoking can be hard, but it is one of the best things that you can do for your health.  When you decide to quit smoking, make a plan to help you succeed.  Quit smoking right away, not slowly over a period of time.  When you start quitting, seek help from your doctor, family, or friends. This information is not intended to replace advice given to you by your health care provider. Make sure you discuss any questions you have with your health care provider. Document Revised: 01/20/2019 Document Reviewed: 07/16/2018 Elsevier Patient Education  2020 Elsevier Inc.  

## 2019-07-20 ENCOUNTER — Other Ambulatory Visit: Payer: Self-pay

## 2019-07-20 ENCOUNTER — Ambulatory Visit: Payer: Medicaid Other | Admitting: Orthopaedic Surgery

## 2019-07-20 ENCOUNTER — Encounter: Payer: Self-pay | Admitting: Orthopaedic Surgery

## 2019-07-20 ENCOUNTER — Ambulatory Visit: Payer: Medicaid Other

## 2019-07-20 ENCOUNTER — Telehealth: Payer: Self-pay | Admitting: Orthopaedic Surgery

## 2019-07-20 VITALS — BP 116/77 | HR 81 | Ht 64.0 in | Wt 250.0 lb

## 2019-07-20 DIAGNOSIS — G8929 Other chronic pain: Secondary | ICD-10-CM | POA: Diagnosis not present

## 2019-07-20 DIAGNOSIS — M545 Low back pain, unspecified: Secondary | ICD-10-CM

## 2019-07-20 DIAGNOSIS — S92352A Displaced fracture of fifth metatarsal bone, left foot, initial encounter for closed fracture: Secondary | ICD-10-CM | POA: Diagnosis not present

## 2019-07-20 DIAGNOSIS — Z6841 Body Mass Index (BMI) 40.0 and over, adult: Secondary | ICD-10-CM

## 2019-07-20 DIAGNOSIS — F17210 Nicotine dependence, cigarettes, uncomplicated: Secondary | ICD-10-CM | POA: Diagnosis not present

## 2019-07-20 NOTE — Telephone Encounter (Signed)
We discussed this in room.  NO narcotics.  Continue the NSAIDs.  NO narcotics.

## 2019-07-20 NOTE — Telephone Encounter (Signed)
Called back to patient. Voiced understanding.

## 2019-07-20 NOTE — Patient Instructions (Signed)
Steps to Quit Smoking Smoking tobacco is the leading cause of preventable death. It can affect almost every organ in the body. Smoking puts you and people around you at risk for many serious, long-lasting (chronic) diseases. Quitting smoking can be hard, but it is one of the best things that you can do for your health. It is never too late to quit. How do I get ready to quit? When you decide to quit smoking, make a plan to help you succeed. Before you quit:  Pick a date to quit. Set a date within the next 2 weeks to give you time to prepare.  Write down the reasons why you are quitting. Keep this list in places where you will see it often.  Tell your family, friends, and co-workers that you are quitting. Their support is important.  Talk with your doctor about the choices that may help you quit.  Find out if your health insurance will pay for these treatments.  Know the people, places, things, and activities that make you want to smoke (triggers). Avoid them. What first steps can I take to quit smoking?  Throw away all cigarettes at home, at work, and in your car.  Throw away the things that you use when you smoke, such as ashtrays and lighters.  Clean your car. Make sure to empty the ashtray.  Clean your home, including curtains and carpets. What can I do to help me quit smoking? Talk with your doctor about taking medicines and seeing a counselor at the same time. You are more likely to succeed when you do both.  If you are pregnant or breastfeeding, talk with your doctor about counseling or other ways to quit smoking. Do not take medicine to help you quit smoking unless your doctor tells you to do so. To quit smoking: Quit right away  Quit smoking totally, instead of slowly cutting back on how much you smoke over a period of time.  Go to counseling. You are more likely to quit if you go to counseling sessions regularly. Take medicine You may take medicines to help you quit. Some  medicines need a prescription, and some you can buy over-the-counter. Some medicines may contain a drug called nicotine to replace the nicotine in cigarettes. Medicines may:  Help you to stop having the desire to smoke (cravings).  Help to stop the problems that come when you stop smoking (withdrawal symptoms). Your doctor may ask you to use:  Nicotine patches, gum, or lozenges.  Nicotine inhalers or sprays.  Non-nicotine medicine that is taken by mouth. Find resources Find resources and other ways to help you quit smoking and remain smoke-free after you quit. These resources are most helpful when you use them often. They include:  Online chats with a counselor.  Phone quitlines.  Printed self-help materials.  Support groups or group counseling.  Text messaging programs.  Mobile phone apps. Use apps on your mobile phone or tablet that can help you stick to your quit plan. There are many free apps for mobile phones and tablets as well as websites. Examples include Quit Guide from the CDC and smokefree.gov  What things can I do to make it easier to quit?   Talk to your family and friends. Ask them to support and encourage you.  Call a phone quitline (1-800-QUIT-NOW), reach out to support groups, or work with a counselor.  Ask people who smoke to not smoke around you.  Avoid places that make you want to smoke,   such as: ? Bars. ? Parties. ? Smoke-break areas at work.  Spend time with people who do not smoke.  Lower the stress in your life. Stress can make you want to smoke. Try these things to help your stress: ? Getting regular exercise. ? Doing deep-breathing exercises. ? Doing yoga. ? Meditating. ? Doing a body scan. To do this, close your eyes, focus on one area of your body at a time from head to toe. Notice which parts of your body are tense. Try to relax the muscles in those areas. How will I feel when I quit smoking? Day 1 to 3 weeks Within the first 24 hours,  you may start to have some problems that come from quitting tobacco. These problems are very bad 2-3 days after you quit, but they do not often last for more than 2-3 weeks. You may get these symptoms:  Mood swings.  Feeling restless, nervous, angry, or annoyed.  Trouble concentrating.  Dizziness.  Strong desire for high-sugar foods and nicotine.  Weight gain.  Trouble pooping (constipation).  Feeling like you may vomit (nausea).  Coughing or a sore throat.  Changes in how the medicines that you take for other issues work in your body.  Depression.  Trouble sleeping (insomnia). Week 3 and afterward After the first 2-3 weeks of quitting, you may start to notice more positive results, such as:  Better sense of smell and taste.  Less coughing and sore throat.  Slower heart rate.  Lower blood pressure.  Clearer skin.  Better breathing.  Fewer sick days. Quitting smoking can be hard. Do not give up if you fail the first time. Some people need to try a few times before they succeed. Do your best to stick to your quit plan, and talk with your doctor if you have any questions or concerns. Summary  Smoking tobacco is the leading cause of preventable death. Quitting smoking can be hard, but it is one of the best things that you can do for your health.  When you decide to quit smoking, make a plan to help you succeed.  Quit smoking right away, not slowly over a period of time.  When you start quitting, seek help from your doctor, family, or friends. This information is not intended to replace advice given to you by your health care provider. Make sure you discuss any questions you have with your health care provider. Document Revised: 01/20/2019 Document Reviewed: 07/16/2018 Elsevier Patient Education  2020 Elsevier Inc.  
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
## 2019-07-20 NOTE — Telephone Encounter (Signed)
Patient called back following appointment. States 'knows she should have said something in the room today' - states she has already been taking the Ibuprofen, and that it does not help that much. States she is active. Would there be anything else that may be prescribed? If so, pharmacy is [facility] in [place]

## 2019-07-27 ENCOUNTER — Ambulatory Visit: Payer: Medicaid Other

## 2019-07-27 ENCOUNTER — Encounter: Payer: Self-pay | Admitting: Orthopaedic Surgery

## 2019-07-27 ENCOUNTER — Ambulatory Visit: Payer: Medicaid Other | Admitting: Orthopaedic Surgery

## 2019-07-27 ENCOUNTER — Other Ambulatory Visit: Payer: Self-pay

## 2019-07-27 DIAGNOSIS — S92352D Displaced fracture of fifth metatarsal bone, left foot, subsequent encounter for fracture with routine healing: Secondary | ICD-10-CM | POA: Diagnosis not present

## 2019-07-27 DIAGNOSIS — F17210 Nicotine dependence, cigarettes, uncomplicated: Secondary | ICD-10-CM

## 2019-07-27 MED ORDER — HYDROCODONE-ACETAMINOPHEN 5-325 MG PO TABS: ORAL_TABLET | ORAL | 0 refills | Status: DC

## 2019-07-27 NOTE — Patient Instructions (Signed)

## 2019-07-27 NOTE — Progress Notes (Signed)
My foot is not hurting  She has been using the CAM walker on the left.    NV intact.  X-rays were done of the left foot, reported separately.  Encounter Diagnoses  Name Primary?  . Closed fracture of base of fifth metatarsal bone of left foot with routine healing Yes  . Nicotine dependence, cigarettes, uncomplicated    Gradually come out of the CAM walker, return in three weeks, x-rays of the foot at that time.  Call if any problem.  Precautions discussed.  I have reviewed the [place] web site prior to prescribing narcotic medicine for this patient.     Electronically Signed Sanjuana Kava, MD 3/18/20218:36 AM

## 2019-08-04 DIAGNOSIS — M5416 Radiculopathy, lumbar region: Secondary | ICD-10-CM | POA: Diagnosis not present

## 2019-08-04 DIAGNOSIS — G8929 Other chronic pain: Secondary | ICD-10-CM | POA: Insufficient documentation

## 2019-08-04 DIAGNOSIS — G894 Chronic pain syndrome: Secondary | ICD-10-CM | POA: Diagnosis not present

## 2019-08-04 DIAGNOSIS — M79604 Pain in right leg: Secondary | ICD-10-CM | POA: Diagnosis not present

## 2019-08-04 DIAGNOSIS — M545 Low back pain: Secondary | ICD-10-CM | POA: Diagnosis not present

## 2019-08-04 DIAGNOSIS — F32A Depression, unspecified: Secondary | ICD-10-CM | POA: Insufficient documentation

## 2019-08-04 DIAGNOSIS — M461 Sacroiliitis, not elsewhere classified: Secondary | ICD-10-CM | POA: Diagnosis not present

## 2019-08-10 ENCOUNTER — Ambulatory Visit: Payer: Medicaid Other | Admitting: Orthopaedic Surgery

## 2019-08-10 ENCOUNTER — Other Ambulatory Visit: Payer: Self-pay

## 2019-08-10 ENCOUNTER — Encounter: Payer: Self-pay | Admitting: Orthopaedic Surgery

## 2019-08-10 VITALS — BP 120/75 | HR 67 | Temp 98.4°F | Ht 64.0 in | Wt 252.0 lb

## 2019-08-10 DIAGNOSIS — Z6841 Body Mass Index (BMI) 40.0 and over, adult: Secondary | ICD-10-CM

## 2019-08-10 DIAGNOSIS — G8929 Other chronic pain: Secondary | ICD-10-CM

## 2019-08-10 DIAGNOSIS — M545 Low back pain, unspecified: Secondary | ICD-10-CM

## 2019-08-10 DIAGNOSIS — F17210 Nicotine dependence, cigarettes, uncomplicated: Secondary | ICD-10-CM

## 2019-08-10 NOTE — Patient Instructions (Signed)
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
## 2019-08-17 ENCOUNTER — Ambulatory Visit: Payer: Medicaid Other

## 2019-08-17 ENCOUNTER — Ambulatory Visit (INDEPENDENT_AMBULATORY_CARE_PROVIDER_SITE_OTHER): Payer: Medicaid Other | Admitting: Orthopaedic Surgery

## 2019-08-17 ENCOUNTER — Encounter: Payer: Self-pay | Admitting: Orthopaedic Surgery

## 2019-08-17 ENCOUNTER — Other Ambulatory Visit: Payer: Self-pay

## 2019-08-17 VITALS — Temp 98.1°F

## 2019-08-17 DIAGNOSIS — S92352D Displaced fracture of fifth metatarsal bone, left foot, subsequent encounter for fracture with routine healing: Secondary | ICD-10-CM

## 2019-08-17 NOTE — Progress Notes (Signed)
My foot is still sore sometimes  She has been using the CAM walker and a cane.  She has tried going without the CAM walker some.  She has no new trauma.  She got pain medicine on 08-12-2019 from [name] were done today of the left foot, reported separately.  I told her the fracture line is still visible and that in this type of fracture there may be some delayed and less likely nonunion.  I will see her in three weeks.  X-rays of the left foot then.  Encounter Diagnosis  Name Primary?  . Closed fracture of base of fifth metatarsal bone of left foot with routine healing Yes   Call if any problem.  Precautions discussed.   Electronically Signed Sanjuana Kava, MD 4/8/202110:04 AM

## 2019-08-21 ENCOUNTER — Telehealth: Payer: Self-pay | Admitting: Orthopaedic Surgery

## 2019-08-21 NOTE — Telephone Encounter (Signed)
Patient called to relay that she didn't want "Dr Luna Glasgow to think bad of her" at time of visit Thursday, 08/17/19. Wanted to clarify that it is the neurologist, Freddy Finner at [facility] who is prescribing Ultram for her back. Said foot still hurts, especially since she worked all day; understands the Ultram may also help her foot pain. Aware of upcoming scheduled appointments, including MRI of back, at [facility].

## 2019-08-27 ENCOUNTER — Ambulatory Visit (HOSPITAL_COMMUNITY)
Admission: RE | Admit: 2019-08-27 | Discharge: 2019-08-27 | Disposition: A | Discharge status: Home / Self Care | Payer: Medicaid Other | Source: Ambulatory Visit | Attending: Orthopaedic Surgery | Admitting: Orthopaedic Surgery

## 2019-08-27 DIAGNOSIS — G8929 Other chronic pain: Secondary | ICD-10-CM | POA: Insufficient documentation

## 2019-08-27 DIAGNOSIS — M545 Low back pain, unspecified: Secondary | ICD-10-CM

## 2019-08-29 ENCOUNTER — Telehealth: Payer: Self-pay | Admitting: Orthopaedic Surgery

## 2019-08-29 ENCOUNTER — Encounter: Payer: Medicaid Other | Admitting: Orthopaedic Surgery

## 2019-08-29 ENCOUNTER — Other Ambulatory Visit: Payer: Self-pay

## 2019-08-29 NOTE — Telephone Encounter (Signed)
I sent message to [name] yesterday that patient needs a new MRI.  She is to set this up.  No need for visit today.  Taylor [name] to set it up for MRI.

## 2019-08-29 NOTE — Telephone Encounter (Signed)
Called patient to notify. Routing to [place].

## 2019-08-29 NOTE — Telephone Encounter (Signed)
Patient requests MRI results via virtual visit - states having cold symptoms, as well as "everyone in the house". Please advise if okay to change to virtual, and if late afternoon today, 08/29/19, would be okay to schedule.

## 2019-08-31 ENCOUNTER — Other Ambulatory Visit: Payer: Self-pay

## 2019-09-07 ENCOUNTER — Other Ambulatory Visit: Payer: Self-pay

## 2019-09-07 ENCOUNTER — Encounter: Payer: Self-pay | Admitting: Orthopaedic Surgery

## 2019-09-07 ENCOUNTER — Ambulatory Visit (INDEPENDENT_AMBULATORY_CARE_PROVIDER_SITE_OTHER): Payer: Medicaid Other | Admitting: Orthopaedic Surgery

## 2019-09-07 ENCOUNTER — Ambulatory Visit: Payer: Medicaid Other

## 2019-09-07 DIAGNOSIS — S92352G Displaced fracture of fifth metatarsal bone, left foot, subsequent encounter for fracture with delayed healing: Secondary | ICD-10-CM

## 2019-09-07 DIAGNOSIS — S92352D Displaced fracture of fifth metatarsal bone, left foot, subsequent encounter for fracture with routine healing: Secondary | ICD-10-CM

## 2019-09-07 DIAGNOSIS — G8929 Other chronic pain: Secondary | ICD-10-CM

## 2019-09-07 DIAGNOSIS — M545 Low back pain, unspecified: Secondary | ICD-10-CM

## 2019-09-07 NOTE — Progress Notes (Signed)
My foot still hurts some  She has been using the CAM walker.  She has pain.  She has no swelling or redness.  X-rays were done of the left foot, reported separately.  I feel she has a delayed union.  She may go on to a non union.  I have explained this.  We need a little more time.  She is to continue the CAM walker.  Encounter Diagnosis  Name Primary?  . Closed displaced fracture of fifth metatarsal bone of left foot with delayed healing, subsequent encounter Yes   X-rays on return in one month.  Call if any problem.  Precautions discussed.   Electronically Signed Sanjuana Kava, MD 4/29/20218:54 AM

## 2019-09-07 NOTE — Addendum Note (Signed)
Addended by: Elizabeth Sauer on: 09/07/2019 10:53 AM   Modules accepted: Orders

## 2019-09-13 ENCOUNTER — Other Ambulatory Visit: Payer: Self-pay

## 2019-09-13 ENCOUNTER — Ambulatory Visit: Payer: Medicaid Other | Admitting: Family

## 2019-09-13 ENCOUNTER — Encounter: Payer: Self-pay | Admitting: Family

## 2019-09-13 VITALS — BP 123/88 | HR 101 | Temp 97.9°F | Ht 64.0 in | Wt 253.0 lb

## 2019-09-13 DIAGNOSIS — K219 Gastro-esophageal reflux disease without esophagitis: Secondary | ICD-10-CM | POA: Diagnosis not present

## 2019-09-13 DIAGNOSIS — R1012 Left upper quadrant pain: Secondary | ICD-10-CM

## 2019-09-13 DIAGNOSIS — R591 Generalized enlarged lymph nodes: Secondary | ICD-10-CM

## 2019-09-13 MED ORDER — OMEPRAZOLE 40 MG PO CPDR: 40.0000 mg | DELAYED_RELEASE_CAPSULE | Freq: Every day | ORAL | 3 refills | Status: DC

## 2019-09-13 NOTE — Patient Instructions (Signed)
Lymphadenopathy  Lymphadenopathy means that your lymph glands are swollen or larger than normal (enlarged). Lymph glands, also called lymph nodes, are collections of tissue that filter bacteria, viruses, and waste from your bloodstream. They are part of your body's disease-fighting system (immune system), which protects your body from germs. There may be different causes of lymphadenopathy, depending on where it is in your body. Some types go away on their own. Lymphadenopathy can occur anywhere that you have lymph glands, including these areas:  Neck (cervical lymphadenopathy).  Chest (mediastinal lymphadenopathy).  Lungs (hilar lymphadenopathy).  Underarms (axillary lymphadenopathy).  Groin (inguinal lymphadenopathy). When your immune system responds to germs, infection-fighting cells and fluid build up in your lymph glands. This causes some swelling and enlargement. If the lymph glands do not go back to normal after you have an infection or disease, your health care provider may do tests. These tests help to monitor your condition and find the reason why the glands are still swollen and enlarged. Follow these instructions at home:  Get plenty of rest.  Take over-the-counter and prescription medicines only as told by your health care provider. Your health care provider may recommend over-the-counter medicines for pain.  If directed, apply heat to swollen lymph glands as often as told by your health care provider. Use the heat source that your health care provider recommends, such as a moist heat pack or a heating pad. ? Place a towel between your skin and the heat source. ? Leave the heat on for 20-30 minutes. ? Remove the heat if your skin turns bright red. This is especially important if you are unable to feel pain, heat, or cold. You may have a greater risk of getting burned.  Check your affected lymph glands every day for changes. Check other lymph gland areas as told by your health  care provider. Check for changes such as: ? More swelling. ? Sudden increase in size. ? Redness or pain. ? Hardness.  Keep all follow-up visits as told by your health care provider. This is important. Contact a health care provider if you have:  Swelling that gets worse or spreads to other areas.  Problems with breathing.  Lymph glands that: ? Are still swollen after 2 weeks. ? Have suddenly gotten bigger. ? Are red, painful, or hard.  A fever or chills.  Fatigue.  A sore throat.  Pain in your abdomen.  Weight loss.  Night sweats. Get help right away if you have:  Fluid leaking from an enlarged lymph gland.  Severe pain.  Chest pain.  Shortness of breath. Summary  Lymphadenopathy means that your lymph glands are swollen or larger than normal (enlarged).  Lymph glands (also called lymph nodes) are collections of tissue that filter bacteria, viruses, and waste from the bloodstream. They are part of your body's disease-fighting system (immune system).  Lymphadenopathy can occur anywhere that you have lymph glands.  If your enlarged and swollen lymph glands do not go back to normal after you have an infection or disease, your health care provider may do tests to monitor your condition and find the reason why the glands are still swollen and enlarged.  Check your affected lymph glands every day for changes. Check other lymph gland areas as told by your health care provider. This information is not intended to replace advice given to you by your health care provider. Make sure you discuss any questions you have with your health care provider. Document Revised: 04/09/2017 Document Reviewed: 03/12/2017 Elsevier Patient   Education  2020 [employer]  

## 2019-09-13 NOTE — Progress Notes (Signed)
Subjective:    Patient ID: Taylor [name], female    DOB: March 07, 1991, 29 y.o.   MRN: [id number]  Chief Complaint  Patient presents with  . Cyst    under right armpit tender to touch. Noticed 3 weeks ago   . Abdominal Pain    left side   PT presents to the office today with three different "lumps" under her right axilla that she noticed 3-4 weeks ago. She states she noticed one and then about about a week later found the other ones. She reports tenderness when pushing. Denies any fever, sore throat, or ear pain. Does report night sweats over the last month.  Abdominal Pain This is a new problem. The current episode started more than 1 month ago. The onset quality is gradual. The problem occurs intermittently. The problem has been unchanged. The pain is located in the LUQ. The pain is at a severity of 5/10. The pain is mild. The quality of the pain is aching. The abdominal pain does not radiate. Associated symptoms include nausea. Pertinent negatives include no belching, constipation, dysuria, fever, flatus, hematochezia or vomiting. Associated symptoms comments: gerd . The pain is aggravated by NSAIDs. She has tried antacids for the symptoms. The treatment provided mild relief.      Review of Systems  Constitutional: Negative for fever.  Gastrointestinal: Positive for abdominal pain and nausea. Negative for constipation, flatus, hematochezia and vomiting.  Genitourinary: Negative for dysuria.  All other systems reviewed and are negative.      Objective:   Physical Exam Vitals reviewed.  Constitutional:      General: She is not in acute distress.    Appearance: She is well-developed.  HENT:     Head: Normocephalic and atraumatic.  Eyes:     Pupils: Pupils are equal, round, and reactive to light.  Neck:     Thyroid: No thyromegaly.  Cardiovascular:     Rate and Rhythm: Normal rate and regular rhythm.     Heart sounds: Normal heart sounds. No murmur.  Pulmonary:     Effort:  Pulmonary effort is normal. No respiratory distress.     Breath sounds: Normal breath sounds. No wheezing.  Abdominal:     General: Bowel sounds are normal. There is no distension.     Palpations: Abdomen is soft.     Tenderness: There is no abdominal tenderness.  Musculoskeletal:        General: No tenderness. Normal range of motion.     Cervical back: Normal range of motion and neck supple.  Lymphadenopathy:     Upper Body:     Right upper body: Axillary adenopathy (three tender moveable nodes approx size of a rasin ) present.  Skin:    General: Skin is warm and dry.  Neurological:     Mental Status: She is alert and oriented to person, place, and time.     Cranial Nerves: No cranial nerve deficit.     Deep Tendon Reflexes: Reflexes are normal and symmetric.  Psychiatric:        Behavior: Behavior normal.        Thought Content: Thought content normal.        Judgment: Judgment normal.     BP 123/88   Pulse (!) 101   Temp 97.9 F (36.6 C) (Temporal)   Ht 5' 4"  (1.626 m)   Wt 253 lb (114.8 kg)   SpO2 96%   BMI 43.43 kg/m        Assessment &  Plan:  Taylor [name] comes in today with chief complaint of Cyst (under right armpit tender to touch. Noticed 3 weeks ago ) and Abdominal Pain (left side)   Diagnosis and orders addressed:  1. Gastroesophageal reflux disease, unspecified whether esophagitis present Will increase PPI to 40 mg from 20 mg - omeprazole (PRILOSEC) 40 MG capsule; Take 1 capsule (40 mg total) by mouth daily.  Dispense: 30 capsule; Refill: 3 - CBC with Differential/Platelet - BMP8+EGFR - H Pylori, IGM, IGG, IGA AB  2. Lymphadenopathy CBC pending Continue to monitor  Given that they are tender and moveable will wait for CBC to return. As long as this is stable we will have patient monitor for next 2 weeks. If it does not resolve will need further investigating. Night sweats is worrisome for lymphoma.  - CBC with Differential/Platelet - BMP8+EGFR  - H Pylori, IGM, IGG, IGA AB  3. Left upper quadrant abdominal pain Labs pending Limit NSAID's  - CBC with Differential/Platelet - BMP8+EGFR - H Pylori, IGM, IGG, IGA AB    Evelina Dun, FNP

## 2019-09-14 DIAGNOSIS — F341 Dysthymic disorder: Secondary | ICD-10-CM | POA: Diagnosis not present

## 2019-09-14 DIAGNOSIS — K219 Gastro-esophageal reflux disease without esophagitis: Secondary | ICD-10-CM | POA: Diagnosis not present

## 2019-09-14 DIAGNOSIS — M79672 Pain in left foot: Secondary | ICD-10-CM | POA: Diagnosis not present

## 2019-09-14 DIAGNOSIS — M461 Sacroiliitis, not elsewhere classified: Secondary | ICD-10-CM | POA: Diagnosis not present

## 2019-09-14 DIAGNOSIS — M79604 Pain in right leg: Secondary | ICD-10-CM | POA: Diagnosis not present

## 2019-09-14 DIAGNOSIS — M545 Low back pain: Secondary | ICD-10-CM | POA: Diagnosis not present

## 2019-09-14 DIAGNOSIS — M5416 Radiculopathy, lumbar region: Secondary | ICD-10-CM | POA: Diagnosis not present

## 2019-09-15 ENCOUNTER — Other Ambulatory Visit: Payer: Self-pay | Admitting: Family

## 2019-09-15 DIAGNOSIS — M79672 Pain in left foot: Secondary | ICD-10-CM | POA: Diagnosis not present

## 2019-09-15 DIAGNOSIS — M545 Low back pain: Secondary | ICD-10-CM | POA: Diagnosis not present

## 2019-09-15 DIAGNOSIS — D72829 Elevated white blood cell count, unspecified: Secondary | ICD-10-CM

## 2019-09-15 DIAGNOSIS — R591 Generalized enlarged lymph nodes: Secondary | ICD-10-CM

## 2019-09-15 DIAGNOSIS — M79604 Pain in right leg: Secondary | ICD-10-CM | POA: Diagnosis not present

## 2019-09-15 DIAGNOSIS — R61 Generalized hyperhidrosis: Secondary | ICD-10-CM

## 2019-09-15 LAB — CBC WITH DIFFERENTIAL/PLATELET
Basophils Absolute: 0.1 10*3/uL (ref 0.0–0.2)
Basos: 1 %
EOS (ABSOLUTE): 0.6 10*3/uL — ABNORMAL HIGH (ref 0.0–0.4)
Eos: 5 %
Hematocrit: 40.7 % (ref 34.0–46.6)
Hemoglobin: 13.8 g/dL (ref 11.1–15.9)
Immature Grans (Abs): 0.1 10*3/uL (ref 0.0–0.1)
Immature Granulocytes: 1 %
Lymphocytes Absolute: 3.3 10*3/uL — ABNORMAL HIGH (ref 0.7–3.1)
Lymphs: 24 %
MCH: 31.8 pg (ref 26.6–33.0)
MCHC: 33.9 g/dL (ref 31.5–35.7)
MCV: 94 fL (ref 79–97)
Monocytes Absolute: 0.8 10*3/uL (ref 0.1–0.9)
Monocytes: 6 %
Neutrophils Absolute: 8.9 10*3/uL — ABNORMAL HIGH (ref 1.4–7.0)
Neutrophils: 63 %
Platelets: 283 10*3/uL (ref 150–450)
RBC: 4.34 x10E6/uL (ref 3.77–5.28)
RDW: 12.4 % (ref 11.7–15.4)
WBC: 13.8 10*3/uL — ABNORMAL HIGH (ref 3.4–10.8)

## 2019-09-15 LAB — H PYLORI, IGM, IGG, IGA AB
H pylori, IgM Abs: <9 units (ref 0.0–8.9)
H. pylori, IgA Abs: <9 units (ref 0.0–8.9)
H. pylori, IgG AbS: 0.32 Index Value (ref 0.00–0.79)

## 2019-09-15 LAB — BMP8+EGFR
BUN/Creatinine Ratio: 22 (ref 9–23)
BUN: 13 mg/dL (ref 6–20)
CO2: 24 mmol/L (ref 20–29)
Calcium: 10 mg/dL (ref 8.7–10.2)
Chloride: 99 mmol/L (ref 96–106)
Creatinine, Ser: 0.6 mg/dL (ref 0.57–1.00)
GFR calc Af Amer: 141 mL/min/{1.73_m2} (ref >59)
GFR calc non Af Amer: 123 mL/min/{1.73_m2} (ref >59)
Glucose: 77 mg/dL (ref 65–99)
Potassium: 4.5 mmol/L (ref 3.5–5.2)
Sodium: 138 mmol/L (ref 134–144)

## 2019-09-17 ENCOUNTER — Ambulatory Visit (HOSPITAL_COMMUNITY): Payer: Medicaid Other

## 2019-09-19 ENCOUNTER — Encounter (HOSPITAL_COMMUNITY): Payer: Self-pay | Admitting: *Deleted

## 2019-09-19 ENCOUNTER — Other Ambulatory Visit: Payer: Self-pay

## 2019-09-20 ENCOUNTER — Encounter (HOSPITAL_COMMUNITY): Payer: Self-pay | Admitting: Hematology

## 2019-09-20 ENCOUNTER — Inpatient Hospital Stay (HOSPITAL_COMMUNITY): Payer: Medicaid Other

## 2019-09-20 ENCOUNTER — Inpatient Hospital Stay (HOSPITAL_COMMUNITY): Payer: Medicaid Other | Attending: Hematology | Admitting: Hematology

## 2019-09-20 DIAGNOSIS — F419 Anxiety disorder, unspecified: Secondary | ICD-10-CM | POA: Insufficient documentation

## 2019-09-20 DIAGNOSIS — F329 Major depressive disorder, single episode, unspecified: Secondary | ICD-10-CM | POA: Diagnosis not present

## 2019-09-20 DIAGNOSIS — R61 Generalized hyperhidrosis: Secondary | ICD-10-CM | POA: Diagnosis not present

## 2019-09-20 DIAGNOSIS — Z79899 Other long term (current) drug therapy: Secondary | ICD-10-CM | POA: Insufficient documentation

## 2019-09-20 DIAGNOSIS — D72829 Elevated white blood cell count, unspecified: Secondary | ICD-10-CM

## 2019-09-20 DIAGNOSIS — F17210 Nicotine dependence, cigarettes, uncomplicated: Secondary | ICD-10-CM | POA: Diagnosis not present

## 2019-09-20 DIAGNOSIS — R609 Edema, unspecified: Secondary | ICD-10-CM | POA: Insufficient documentation

## 2019-09-20 LAB — CBC WITH DIFFERENTIAL/PLATELET
Abs Immature Granulocytes: 0.03 10*3/uL (ref 0.00–0.07)
Basophils Absolute: 0.1 10*3/uL (ref 0.0–0.1)
Basophils Relative: 1 %
Eosinophils Absolute: 0.5 10*3/uL (ref 0.0–0.5)
Eosinophils Relative: 5 %
HCT: 41.1 % (ref 36.0–46.0)
Hemoglobin: 13.7 g/dL (ref 12.0–15.0)
Immature Granulocytes: 0 %
Lymphocytes Relative: 27 %
Lymphs Abs: 2.7 10*3/uL (ref 0.7–4.0)
MCH: 31.6 pg (ref 26.0–34.0)
MCHC: 33.3 g/dL (ref 30.0–36.0)
MCV: 94.9 fL (ref 80.0–100.0)
Monocytes Absolute: 0.6 10*3/uL (ref 0.1–1.0)
Monocytes Relative: 6 %
Neutro Abs: 6.1 10*3/uL (ref 1.7–7.7)
Neutrophils Relative %: 61 %
Platelets: 250 10*3/uL (ref 150–400)
RBC: 4.33 MIL/uL (ref 3.87–5.11)
RDW: 12.9 % (ref 11.5–15.5)
WBC: 9.9 10*3/uL (ref 4.0–10.5)
nRBC: 0 % (ref 0.0–0.2)

## 2019-09-20 LAB — SEDIMENTATION RATE: Sed Rate: 13 mm/hr (ref 0–22)

## 2019-09-20 LAB — LACTATE DEHYDROGENASE: LDH: 133 U/L (ref 98–192)

## 2019-09-20 LAB — C-REACTIVE PROTEIN: CRP: 0.8 mg/dL (ref <1.0)

## 2019-09-20 NOTE — Assessment & Plan Note (Addendum)
1.  Leukocytosis: -CBC on 09/13/2019 shows white count 13.8 with normal hemoglobin and platelet count.  Absolute neutrophil count and absolute lymphocyte count were elevated. -She had history of leukocytosis since May 2020 with white count ranging between 11.2 and 17.1.  It was mostly neutrophilic leukocytosis although on one occasion there was elevated lymphocyte count also. -MRI of the lumbar spine without contrast on 08/27/2019 was suggestive of sacroiliitis.  No incidental lymphadenopathy. -She is not on systemic steroids.  Denies any history of connective tissue disorders. -She smokes half pack to 1 pack/day for 13 years.  Reported night sweats on and off in the last 2 months, around 2 AM, nondrenching type. -She reportedly had lymph node swellings in the right axilla last week.  Today physical examination did not reveal any palpable adenopathy. -We have discussed the the differential for elevated white count.  We will check her CBC today.  We will also check for connective tissue disorders.  We will evaluate for myeloproliferative disorders by checking JAK2 V617F and BCR/ABL. -If stable tests are negative, likely cause of leukocytosis is smoking.  2.  Family history: -Maternal grandmother had ITP and died of acute leukemia.  Maternal uncle had ITP.  Mother had floor of the mouth cancer.  3.  Right iliac bone edema: -MRI of the lumbar spine without contrast on 08/27/2019 showed prominent edema signal within the right iliac bone, abutting the right sacroiliac joint and extending more laterally measuring 5 x 1.2 cm. -She is having MRI with contrast this Friday.
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
## 2019-09-20 NOTE — Patient Instructions (Signed)
[facility] at [facility] Discharge Instructions  You were seen today by Dr. Delton Coombes. He went over your history, family history and how you've been feeling lately. You will have blood drawn today before you leave the hospital today. He will see you back in 2 weeks for follow up.   Thank you for choosing [facility] at [facility] to provide your oncology and hematology care.  To afford each patient quality time with our provider, please arrive at least 15 minutes before your scheduled appointment time.   If you have a lab appointment with the [facility] please come in thru the  Main Entrance and check in at the main information desk  You need to re-schedule your appointment should you arrive 10 or more minutes late.  We strive to give you quality time with our providers, and arriving late affects you and other patients whose appointments are after yours.  Also, if you no show three or more times for appointments you may be dismissed from the clinic at the providers discretion.     Again, thank you for choosing [facility].  Our hope is that these requests will decrease the amount of time that you wait before being seen by our physicians.       _____________________________________________________________  Should you have questions after your visit to [facility], please contact our office at [phone] between the hours of 8:00 a.m. and 4:30 p.m.  Voicemails left after 4:00 p.m. will not be returned until the following business day.  For prescription refill requests, have your pharmacy contact our office and allow 72 hours.    Cancer Center Support Programs:   > Cancer Support Group  2nd Tuesday of the month 1pm-2pm, Journey Room

## 2019-09-21 LAB — RHEUMATOID FACTOR: Rheumatoid fact SerPl-aCnc: <10 IU/mL (ref 0.0–13.9)

## 2019-09-22 DIAGNOSIS — M461 Sacroiliitis, not elsewhere classified: Secondary | ICD-10-CM | POA: Diagnosis not present

## 2019-09-22 DIAGNOSIS — M545 Low back pain: Secondary | ICD-10-CM | POA: Diagnosis not present

## 2019-09-22 LAB — ANTINUCLEAR ANTIBODIES, IFA: ANA Ab, IFA: NEGATIVE

## 2019-09-23 ENCOUNTER — Other Ambulatory Visit: Payer: Self-pay

## 2019-09-23 ENCOUNTER — Ambulatory Visit (HOSPITAL_COMMUNITY)
Admission: RE | Admit: 2019-09-23 | Discharge: 2019-09-23 | Disposition: A | Discharge status: Home / Self Care | Payer: Medicaid Other | Source: Ambulatory Visit | Attending: Orthopaedic Surgery | Admitting: Orthopaedic Surgery

## 2019-09-23 DIAGNOSIS — G8929 Other chronic pain: Secondary | ICD-10-CM | POA: Diagnosis not present

## 2019-09-23 DIAGNOSIS — M545 Low back pain: Secondary | ICD-10-CM | POA: Insufficient documentation

## 2019-09-23 DIAGNOSIS — S32301A Unspecified fracture of right ilium, initial encounter for closed fracture: Secondary | ICD-10-CM | POA: Diagnosis not present

## 2019-09-23 MED ORDER — GADOBUTROL 1 MMOL/ML IV SOLN
10.0000 mL | Freq: Once | INTRAVENOUS | Status: AC | PRN
  Administered 2019-09-23: 10 mL via INTRAVENOUS

## 2019-09-26 ENCOUNTER — Other Ambulatory Visit: Payer: Self-pay

## 2019-09-26 ENCOUNTER — Ambulatory Visit: Payer: Medicaid Other | Admitting: Orthopaedic Surgery

## 2019-09-26 ENCOUNTER — Encounter: Payer: Self-pay | Admitting: Orthopaedic Surgery

## 2019-09-26 VITALS — Ht 65.0 in | Wt 252.0 lb

## 2019-09-26 DIAGNOSIS — S32301A Unspecified fracture of right ilium, initial encounter for closed fracture: Secondary | ICD-10-CM | POA: Diagnosis not present

## 2019-09-26 DIAGNOSIS — Z6841 Body Mass Index (BMI) 40.0 and over, adult: Secondary | ICD-10-CM
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
## 2019-09-28 DIAGNOSIS — M545 Low back pain: Secondary | ICD-10-CM | POA: Diagnosis not present

## 2019-09-28 DIAGNOSIS — Z79891 Long term (current) use of opiate analgesic: Secondary | ICD-10-CM | POA: Diagnosis not present

## 2019-09-28 LAB — CALR + JAK2 E12-15 + MPL (REFLEXED)

## 2019-09-28 LAB — JAK2 V617F, W REFLEX TO CALR/E12/MPL

## 2019-09-28 LAB — BCR-ABL1 FISH
Cells Analyzed: 200
Cells Counted: 200

## 2019-10-04 ENCOUNTER — Other Ambulatory Visit (HOSPITAL_COMMUNITY): Payer: Medicaid Other

## 2019-10-04 ENCOUNTER — Ambulatory Visit (HOSPITAL_COMMUNITY): Payer: Medicaid Other | Admitting: Hematology

## 2019-10-04 ENCOUNTER — Ambulatory Visit (HOSPITAL_COMMUNITY): Payer: Medicaid Other | Admitting: Nurse Practitioner

## 2019-10-05 ENCOUNTER — Ambulatory Visit: Payer: Medicaid Other

## 2019-10-05 ENCOUNTER — Encounter: Payer: Self-pay | Admitting: Orthopaedic Surgery

## 2019-10-05 ENCOUNTER — Ambulatory Visit (INDEPENDENT_AMBULATORY_CARE_PROVIDER_SITE_OTHER): Payer: Medicaid Other | Admitting: Orthopaedic Surgery

## 2019-10-05 ENCOUNTER — Other Ambulatory Visit: Payer: Self-pay

## 2019-10-05 DIAGNOSIS — F17210 Nicotine dependence, cigarettes, uncomplicated: Secondary | ICD-10-CM

## 2019-10-05 DIAGNOSIS — S99192G Other physeal fracture of left metatarsal, subsequent encounter for fracture with delayed healing: Secondary | ICD-10-CM

## 2019-10-05 NOTE — Patient Instructions (Signed)
Steps to Quit Smoking Smoking tobacco is the leading cause of preventable death. It can affect almost every organ in the body. Smoking puts you and people around you at risk for many serious, long-lasting (chronic) diseases. Quitting smoking can be hard, but it is one of the best things that you can do for your health. It is never too late to quit. How do I get ready to quit? When you decide to quit smoking, make a plan to help you succeed. Before you quit:  Pick a date to quit. Set a date within the next 2 weeks to give you time to prepare.  Write down the reasons why you are quitting. Keep this list in places where you will see it often.  Tell your family, friends, and co-workers that you are quitting. Their support is important.  Talk with your doctor about the choices that may help you quit.  Find out if your health insurance will pay for these treatments.  Know the people, places, things, and activities that make you want to smoke (triggers). Avoid them. What first steps can I take to quit smoking?  Throw away all cigarettes at home, at work, and in your car.  Throw away the things that you use when you smoke, such as ashtrays and lighters.  Clean your car. Make sure to empty the ashtray.  Clean your home, including curtains and carpets. What can I do to help me quit smoking? Talk with your doctor about taking medicines and seeing a counselor at the same time. You are more likely to succeed when you do both.  If you are pregnant or breastfeeding, talk with your doctor about counseling or other ways to quit smoking. Do not take medicine to help you quit smoking unless your doctor tells you to do so. To quit smoking: Quit right away  Quit smoking totally, instead of slowly cutting back on how much you smoke over a period of time.  Go to counseling. You are more likely to quit if you go to counseling sessions regularly. Take medicine You may take medicines to help you quit. Some  medicines need a prescription, and some you can buy over-the-counter. Some medicines may contain a drug called nicotine to replace the nicotine in cigarettes. Medicines may:  Help you to stop having the desire to smoke (cravings).  Help to stop the problems that come when you stop smoking (withdrawal symptoms). Your doctor may ask you to use:  Nicotine patches, gum, or lozenges.  Nicotine inhalers or sprays.  Non-nicotine medicine that is taken by mouth. Find resources Find resources and other ways to help you quit smoking and remain smoke-free after you quit. These resources are most helpful when you use them often. They include:  Online chats with a counselor.  Phone quitlines.  Printed self-help materials.  Support groups or group counseling.  Text messaging programs.  Mobile phone apps. Use apps on your mobile phone or tablet that can help you stick to your quit plan. There are many free apps for mobile phones and tablets as well as websites. Examples include Quit Guide from the CDC and smokefree.gov  What things can I do to make it easier to quit?   Talk to your family and friends. Ask them to support and encourage you.  Call a phone quitline (1-800-QUIT-NOW), reach out to support groups, or work with a counselor.  Ask people who smoke to not smoke around you.  Avoid places that make you want to smoke,   such as: ? Bars. ? Parties. ? Smoke-break areas at work.  Spend time with people who do not smoke.  Lower the stress in your life. Stress can make you want to smoke. Try these things to help your stress: ? Getting regular exercise. ? Doing deep-breathing exercises. ? Doing yoga. ? Meditating. ? Doing a body scan. To do this, close your eyes, focus on one area of your body at a time from head to toe. Notice which parts of your body are tense. Try to relax the muscles in those areas. How will I feel when I quit smoking? Day 1 to 3 weeks Within the first 24 hours,  you may start to have some problems that come from quitting tobacco. These problems are very bad 2-3 days after you quit, but they do not often last for more than 2-3 weeks. You may get these symptoms:  Mood swings.  Feeling restless, nervous, angry, or annoyed.  Trouble concentrating.  Dizziness.  Strong desire for high-sugar foods and nicotine.  Weight gain.  Trouble pooping (constipation).  Feeling like you may vomit (nausea).  Coughing or a sore throat.  Changes in how the medicines that you take for other issues work in your body.  Depression.  Trouble sleeping (insomnia). Week 3 and afterward After the first 2-3 weeks of quitting, you may start to notice more positive results, such as:  Better sense of smell and taste.  Less coughing and sore throat.  Slower heart rate.  Lower blood pressure.  Clearer skin.  Better breathing.  Fewer sick days. Quitting smoking can be hard. Do not give up if you fail the first time. Some people need to try a few times before they succeed. Do your best to stick to your quit plan, and talk with your doctor if you have any questions or concerns. Summary  Smoking tobacco is the leading cause of preventable death. Quitting smoking can be hard, but it is one of the best things that you can do for your health.  When you decide to quit smoking, make a plan to help you succeed.  Quit smoking right away, not slowly over a period of time.  When you start quitting, seek help from your doctor, family, or friends. This information is not intended to replace advice given to you by your health care provider. Make sure you discuss any questions you have with your health care provider. Document Revised: 01/20/2019 Document Reviewed: 07/16/2018 Elsevier Patient Education  2020 Elsevier Inc.  
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
## 2019-10-10 ENCOUNTER — Other Ambulatory Visit: Payer: Self-pay

## 2019-10-10 ENCOUNTER — Inpatient Hospital Stay (HOSPITAL_COMMUNITY): Payer: Medicaid Other | Attending: Hematology | Admitting: Hematology

## 2019-10-10 ENCOUNTER — Other Ambulatory Visit: Payer: Self-pay | Admitting: Family

## 2019-10-10 ENCOUNTER — Ambulatory Visit (HOSPITAL_COMMUNITY)
Admission: RE | Admit: 2019-10-10 | Discharge: 2019-10-10 | Disposition: A | Discharge status: Home / Self Care | Payer: Medicaid Other | Source: Ambulatory Visit | Attending: Orthopaedic Surgery | Admitting: Orthopaedic Surgery

## 2019-10-10 DIAGNOSIS — S32301A Unspecified fracture of right ilium, initial encounter for closed fracture: Secondary | ICD-10-CM

## 2019-10-10 DIAGNOSIS — Z87442 Personal history of urinary calculi: Secondary | ICD-10-CM

## 2019-10-10 DIAGNOSIS — Z13820 Encounter for screening for osteoporosis: Secondary | ICD-10-CM | POA: Diagnosis not present

## 2019-10-10 DIAGNOSIS — F329 Major depressive disorder, single episode, unspecified: Secondary | ICD-10-CM | POA: Insufficient documentation

## 2019-10-10 DIAGNOSIS — D72829 Elevated white blood cell count, unspecified: Secondary | ICD-10-CM | POA: Diagnosis not present

## 2019-10-10 DIAGNOSIS — F17210 Nicotine dependence, cigarettes, uncomplicated: Secondary | ICD-10-CM | POA: Diagnosis not present

## 2019-10-10 DIAGNOSIS — R200 Anesthesia of skin: Secondary | ICD-10-CM | POA: Insufficient documentation

## 2019-10-10 DIAGNOSIS — R110 Nausea: Secondary | ICD-10-CM | POA: Insufficient documentation

## 2019-10-10 DIAGNOSIS — R519 Headache, unspecified: Secondary | ICD-10-CM | POA: Diagnosis not present

## 2019-10-10 DIAGNOSIS — Z79899 Other long term (current) drug therapy: Secondary | ICD-10-CM | POA: Diagnosis not present

## 2019-10-10 HISTORY — DX: Personal history of urinary calculi: Z87.442

## 2019-10-10 NOTE — Assessment & Plan Note (Signed)
1.  Leukocytosis: -CBC on 09/13/2019 showed white count of 13.8 with a normal hemoglobin and platelet count.  Absolute neutrophil count and absolute lymphocyte count were elevated. -Had a history of leukocytosis since May 2020 with white count ranging between 11.2 and 17.1.  It was mostly neutrophilic leukocytosis although on one occasion there was elevated lymphocyte count also. -MRI of the lumbar spine without contrast on 08/27/2019 was suggested of sacroiliitis.  No incidental lymphadenopathy. -She is not on any systemic steroids.  Denies a history of connective tissue disorder. -She smokes half a pack to 1 pack/day for 13 years.  Reported night sweats on and off in the past 2 months, around 2 AM, nondrenching type. -She reportedly had lymph node swelling in the right axillary last week.  Today's physical examination did not reveal any palpable adenopathy. -Work-up was completed on 09/20/2019 which showed negative for connective tissue disorder.  Negative for BCR/ABL.  Negative for Jak 2 V6 34F. -She was positive for CALR mutation. -If stable test are negative, likely leukocytosis is smoking.  2.  Family history: -Maternal grandmother had ITP and died of acute leukemia. -Maternal uncle had ITP. -Mother had floor of the mouth cancer.  3.  Right iliac bone edema: -MRI of the lumbar spine without contrast on 08/27/2019 showed predominant edema signal within the right iliac bone, abutting the right sacroiliac joint and extending more laterally measuring 5 x 1.2 cm -She had a follow-up MRI on 09/23/2019 which showed nondisplaced right iliac bone fracture with surrounding marrow edema.  Possible very small nondisplaced fracture involving the left sacrum anteriorly.  No hip fracture or AVN. -She follows up with her orthopedic doctor.

## 2019-10-10 NOTE — Telephone Encounter (Signed)
Last office visit 09/13/2019 Last refill 06/05/2018, #90, 2 refills
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
## 2019-10-10 NOTE — Telephone Encounter (Signed)
Last office visit 09/13/2019

## 2019-10-11 DIAGNOSIS — K219 Gastro-esophageal reflux disease without esophagitis: Secondary | ICD-10-CM | POA: Diagnosis not present

## 2019-10-11 DIAGNOSIS — Z79891 Long term (current) use of opiate analgesic: Secondary | ICD-10-CM | POA: Diagnosis not present

## 2019-10-11 DIAGNOSIS — M79604 Pain in right leg: Secondary | ICD-10-CM | POA: Diagnosis not present

## 2019-10-11 DIAGNOSIS — M5416 Radiculopathy, lumbar region: Secondary | ICD-10-CM | POA: Diagnosis not present

## 2019-10-11 DIAGNOSIS — M545 Low back pain: Secondary | ICD-10-CM | POA: Diagnosis not present

## 2019-10-11 DIAGNOSIS — M461 Sacroiliitis, not elsewhere classified: Secondary | ICD-10-CM | POA: Diagnosis not present

## 2019-10-11 DIAGNOSIS — F341 Dysthymic disorder: Secondary | ICD-10-CM | POA: Diagnosis not present

## 2019-10-11 DIAGNOSIS — M79672 Pain in left foot: Secondary | ICD-10-CM | POA: Diagnosis not present

## 2019-10-17 ENCOUNTER — Ambulatory Visit: Payer: Medicaid Other | Admitting: Orthopaedic Surgery

## 2019-10-17 ENCOUNTER — Encounter: Payer: Self-pay | Admitting: Orthopedic Surgery

## 2019-10-17 ENCOUNTER — Ambulatory Visit (INDEPENDENT_AMBULATORY_CARE_PROVIDER_SITE_OTHER): Payer: Medicaid Other | Admitting: Orthopedic Surgery

## 2019-10-17 ENCOUNTER — Encounter: Payer: Self-pay | Admitting: Orthopaedic Surgery

## 2019-10-17 ENCOUNTER — Other Ambulatory Visit: Payer: Self-pay

## 2019-10-17 VITALS — Ht 65.0 in | Wt 251.0 lb

## 2019-10-17 DIAGNOSIS — S92352K Displaced fracture of fifth metatarsal bone, left foot, subsequent encounter for fracture with nonunion: Secondary | ICD-10-CM

## 2019-10-17 DIAGNOSIS — M545 Low back pain, unspecified: Secondary | ICD-10-CM

## 2019-10-17 DIAGNOSIS — G8929 Other chronic pain: Secondary | ICD-10-CM

## 2019-10-17 DIAGNOSIS — S32301A Unspecified fracture of right ilium, initial encounter for closed fracture: Secondary | ICD-10-CM | POA: Diagnosis not present

## 2019-10-17 DIAGNOSIS — F17210 Nicotine dependence, cigarettes, uncomplicated: Secondary | ICD-10-CM | POA: Diagnosis not present

## 2019-10-17 NOTE — Progress Notes (Signed)
My pelvis is not hurting that much.  She is here today because I was concerned of her bone mass density and having had several fractures recently.  She had bone density done which was normal.  I have explained the results.  I will see her in three week to get x-rays of the pelvis.  Encounter Diagnoses  Name Primary?  . Closed nondisplaced fracture of right ilium, unspecified fracture morphology, initial encounter ([place]) Yes  . Nicotine dependence, cigarettes, uncomplicated   . Morbid obesity ([place])   . Chronic right-sided low back pain without sciatica    Call if any problem.  Precautions discussed.   Electronically Signed Sanjuana Kava, MD 6/8/20219:50 AM
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
## 2019-10-18 DIAGNOSIS — R1030 Lower abdominal pain, unspecified: Secondary | ICD-10-CM | POA: Diagnosis not present

## 2019-10-18 DIAGNOSIS — M545 Low back pain: Secondary | ICD-10-CM | POA: Diagnosis not present

## 2019-10-20 NOTE — Progress Notes (Signed)
[place], [address] [place] [address] Phone: [phone] Fax: [phone]    Your procedure is scheduled on Wednesday, June 16th.  Report to [facility] Main Entrance "A" at 6:30 A.M., and check in at the Admitting office.  Call this number if you have problems the morning of surgery:  [phone]  Call [phone] if you have any questions prior to your surgery date Monday-Friday 8am-4pm   Remember:  Do not eat or drink after midnight the night before your surgery   Take these medicines the morning of surgery with A SIP OF WATER cetirizine (ZYRTEC)  DULoxetine (CYMBALTA)  gabapentin (NEURONTIN)  omeprazole (PRILOSEC)  If needed - acetaminophen (TYLENOL), diphenhydrAMINE (BENADRYL), HYDROcodone-acetaminophen (NORCO/VICODIN)   As of today, STOP taking any Aspirin (unless otherwise instructed by your surgeon) and Aspirin containing products, Aleve, Naproxen, Ibuprofen, Motrin, Advil, Goody's, BC's, all herbal medications, fish oil, and all vitamins.                     Do not wear jewelry, make up, or nail polish            Do not wear lotions, powders, perfumes, or deodorant.            Do not shave 48 hours prior to surgery.             Do not bring valuables to the hospital.            [facility] is not responsible for any belongings or valuables.  Do NOT Smoke (Tobacco/Vapping) or drink Alcohol 24 hours prior to your procedure If you use a CPAP at night, you may bring all equipment for your overnight stay.   Contacts, glasses, dentures or bridgework may not be worn into surgery.      For patients admitted to the hospital, discharge time will be determined by your treatment team.   Patients discharged the day of surgery will not be allowed to drive home, and someone needs to stay with them for 24 hours.  Special instructions:   [facility]- Preparing For Surgery  Before surgery, you can play an important role.  Because skin is not sterile, your skin needs to be as free of germs as possible. You can reduce the number of germs on your skin by washing with CHG (chlorahexidine gluconate) Soap before surgery.  CHG is an antiseptic cleaner which kills germs and bonds with the skin to continue killing germs even after washing.    Oral Hygiene is also important to reduce your risk of infection.  Remember - BRUSH YOUR TEETH THE MORNING OF SURGERY WITH YOUR REGULAR TOOTHPASTE  Please do not use if you have an allergy to CHG or antibacterial soaps. If your skin becomes reddened/irritated stop using the CHG.  Do not shave (including legs and underarms) for at least 48 hours prior to first CHG shower. It is OK to shave your face.  Please follow these instructions carefully.   1. Shower the NIGHT BEFORE SURGERY and the MORNING OF SURGERY with CHG Soap.   2. If you chose to wash your hair, wash your hair first as usual with your normal shampoo.  3. After you shampoo, rinse your hair and body thoroughly to remove the shampoo.  4. Use CHG as you would any other liquid soap. You can apply CHG directly to the skin and wash gently with a scrungie or a clean washcloth.  5. Apply the CHG Soap to your body ONLY FROM THE NECK DOWN.  Do not use on open wounds or open sores. Avoid contact with your eyes, ears, mouth and genitals (private parts). Wash Face and genitals (private parts)  with your normal soap.   6. Wash thoroughly, paying special attention to the area where your surgery will be performed.  7. Thoroughly rinse your body with warm water from the neck down.  8. DO NOT shower/wash with your normal soap after using and rinsing off the CHG Soap.  9. Pat yourself dry with a CLEAN TOWEL.  10. Wear CLEAN PAJAMAS to bed the night before surgery, wear comfortable clothes the morning of surgery  11. Place CLEAN SHEETS on your bed the night of your first shower and DO NOT SLEEP WITH PETS.  Day of Surgery: Shower  with CHG soap as instructed above.  Do not apply any deodorants/lotions.  Please wear clean clothes to the hospital/surgery center.   Remember to brush your teeth WITH YOUR REGULAR TOOTHPASTE.   Please read over the following fact sheets that you were given.

## 2019-10-22 ENCOUNTER — Other Ambulatory Visit: Payer: Self-pay | Admitting: Physician Assistant

## 2019-10-23 ENCOUNTER — Encounter (HOSPITAL_COMMUNITY): Payer: Self-pay

## 2019-10-23 ENCOUNTER — Other Ambulatory Visit (HOSPITAL_COMMUNITY)
Admission: RE | Admit: 2019-10-23 | Discharge: 2019-10-23 | Disposition: A | Discharge status: Home / Self Care | Payer: Medicaid Other | Source: Ambulatory Visit | Attending: Orthopedic Surgery | Admitting: Orthopedic Surgery

## 2019-10-23 ENCOUNTER — Encounter (HOSPITAL_COMMUNITY)
Admission: RE | Admit: 2019-10-23 | Discharge: 2019-10-23 | Disposition: A | Discharge status: Home / Self Care | Payer: Medicaid Other | Source: Ambulatory Visit | Attending: Orthopedic Surgery | Admitting: Orthopedic Surgery

## 2019-10-23 ENCOUNTER — Other Ambulatory Visit: Payer: Self-pay

## 2019-10-23 DIAGNOSIS — Z01812 Encounter for preprocedural laboratory examination: Secondary | ICD-10-CM | POA: Diagnosis not present

## 2019-10-23 DIAGNOSIS — Z20822 Contact with and (suspected) exposure to covid-19: Secondary | ICD-10-CM | POA: Insufficient documentation

## 2019-10-23 HISTORY — DX: Gastro-esophageal reflux disease without esophagitis: K21.9

## 2019-10-23 HISTORY — DX: Family history of other specified conditions: Z84.89

## 2019-10-23 HISTORY — DX: Cardiac arrhythmia, unspecified: I49.9

## 2019-10-23 HISTORY — DX: Palpitations: R00.2

## 2019-10-23 HISTORY — DX: Anemia, unspecified: D64.9

## 2019-10-23 HISTORY — DX: Unspecified osteoarthritis, unspecified site: M19.90

## 2019-10-23 LAB — CBC
HCT: 42.6 % (ref 36.0–46.0)
Hemoglobin: 14 g/dL (ref 12.0–15.0)
MCH: 31.7 pg (ref 26.0–34.0)
MCHC: 32.9 g/dL (ref 30.0–36.0)
MCV: 96.4 fL (ref 80.0–100.0)
Platelets: 250 10*3/uL (ref 150–400)
RBC: 4.42 MIL/uL (ref 3.87–5.11)
RDW: 12.6 % (ref 11.5–15.5)
WBC: 12.5 10*3/uL — ABNORMAL HIGH (ref 4.0–10.5)
nRBC: 0 % (ref 0.0–0.2)

## 2019-10-23 LAB — BASIC METABOLIC PANEL
Anion gap: 9 (ref 5–15)
BUN: 8 mg/dL (ref 6–20)
CO2: 23 mmol/L (ref 22–32)
Calcium: 9.3 mg/dL (ref 8.9–10.3)
Chloride: 103 mmol/L (ref 98–111)
Creatinine, Ser: 0.55 mg/dL (ref 0.44–1.00)
GFR calc Af Amer: >60 mL/min (ref >60)
GFR calc non Af Amer: >60 mL/min (ref >60)
Glucose, Bld: 104 mg/dL — ABNORMAL HIGH (ref 70–99)
Potassium: 4.1 mmol/L (ref 3.5–5.1)
Sodium: 135 mmol/L (ref 135–145)

## 2019-10-23 LAB — SARS CORONAVIRUS 2 (TAT 6-24 HRS): SARS Coronavirus 2: NEGATIVE

## 2019-10-23 NOTE — Progress Notes (Signed)
[facility] [address], [place] [address] [place] [address] Phone: [phone] Fax: [phone]    Your procedure is scheduled on Wednesday, June 16th 2021 from 08:30 AM- 09:12 AM  Report to [facility] Main Entrance "A" at 6:30 A.M., and check in at the Admitting office.  Call this number if you have problems the morning of surgery:  [phone]  Call [phone] if you have any questions prior to your surgery date Monday-Friday 8am-4pm.   Remember:  Do not eat after midnight the night before your surgery.  You may drink clear liquids until 05:30 AM the morning of your surgery.    Clear liquids allowed are: Water, Non-Citrus Juices (without pulp), Carbonated Beverages, Clear Tea, Black Coffee Only, and Gatorade.   Enhanced Recovery after Surgery for Orthopedics Enhanced Recovery after Surgery is a protocol used to improve the stress on your body and your recovery after surgery.  Patient Instructions  . The night before surgery:  o No food after midnight. ONLY clear liquids after midnight  .  Marland Kitchen The day of surgery (if you do NOT have diabetes):  o Drink ONE (1) Pre-Surgery Clear Ensure by 05:30 AM.   o This drink was given to you during your hospital  pre-op appointment visit. o Finish the drink at the designated time by the pre-op nurse.  o Nothing else to drink after completing the  Pre-Surgery Clear Ensure.         If you have questions, please contact your surgeon's office.    Take these medicines the morning of surgery with A SIP OF WATER: cetirizine (ZYRTEC)  DULoxetine (CYMBALTA)  gabapentin (NEURONTIN)  omeprazole (PRILOSEC)  If needed - acetaminophen (TYLENOL), diphenhydrAMINE (BENADRYL), HYDROcodone-acetaminophen (NORCO/VICODIN)   As of today, STOP taking any Aspirin (unless otherwise instructed by your surgeon) and Aspirin containing products, Aleve, Naproxen, Ibuprofen, Motrin, Advil, Goody's, BC's, all herbal  medications, fish oil, and all vitamins.          The Morning of Surgery:            Do not wear jewelry, make up, or nail polish.            Do not wear lotions, powders, perfumes, or deodorant.            Do not shave 48 hours prior to surgery.             Do not bring valuables to the hospital.            [facility] is not responsible for any belongings or valuables.  Do NOT Smoke (Tobacco/Vapping) or drink Alcohol 24 hours prior to your procedure.  If you use a CPAP at night, you may bring all equipment for your overnight stay.   Contacts, glasses, dentures or bridgework may not be worn into surgery.      For patients admitted to the hospital, discharge time will be determined by your treatment team.   Patients discharged the day of surgery will not be allowed to drive home, and someone needs to stay with them for 24 hours.  Special instructions:   [facility]- Preparing For Surgery  Before surgery, you can play an important role. Because skin is not sterile, your skin needs to be as free of germs as possible. You can reduce the number of germs on your skin by washing with CHG (chlorahexidine gluconate) Soap before surgery.  CHG is an antiseptic cleaner which kills germs  and bonds with the skin to continue killing germs even after washing.    Oral Hygiene is also important to reduce your risk of infection.  Remember - BRUSH YOUR TEETH THE MORNING OF SURGERY WITH YOUR REGULAR TOOTHPASTE  Please do not use if you have an allergy to CHG or antibacterial soaps. If your skin becomes reddened/irritated stop using the CHG.  Do not shave (including legs and underarms) for at least 48 hours prior to first CHG shower. It is OK to shave your face.  Please follow these instructions carefully.   1. Shower the NIGHT BEFORE SURGERY and the MORNING OF SURGERY with CHG Soap.   2. If you chose to wash your hair, wash your hair first as usual with your normal shampoo.  3. After you shampoo,  rinse your hair and body thoroughly to remove the shampoo.  4. Use CHG as you would any other liquid soap. You can apply CHG directly to the skin and wash gently with a scrungie or a clean washcloth.   5. Apply the CHG Soap to your body ONLY FROM THE NECK DOWN.  Do not use on open wounds or open sores. Avoid contact with your eyes, ears, mouth and genitals (private parts). Wash Face and genitals (private parts)  with your normal soap.   6. Wash thoroughly, paying special attention to the area where your surgery will be performed.  7. Thoroughly rinse your body with warm water from the neck down.  8. DO NOT shower/wash with your normal soap after using and rinsing off the CHG Soap.  9. Pat yourself dry with a CLEAN TOWEL.  10. Wear CLEAN PAJAMAS to bed the night before surgery, wear comfortable clothes the morning of surgery  11. Place CLEAN SHEETS on your bed the night of your first shower and DO NOT SLEEP WITH PETS.  Day of Surgery: Shower with CHG soap as instructed above.  Do not apply any deodorants/lotions.  Please wear clean clothes to the hospital/surgery center.   Remember to brush your teeth WITH YOUR REGULAR TOOTHPASTE.   Please read over the following fact sheets that you were given.

## 2019-10-23 NOTE — Progress Notes (Signed)
PCP - Christy A. Lenna Gilford, [place] Cardiologist - Denies Neurologist- Barton Fanny, NP  PPM/ICD - Denies  Chest x-ray - N/A EKG - N/A Stress Test - Denies ECHO - 10/11/18 Cardiac Cath - Denies  Sleep Study - Yes CPAP - No  Patient denies being diabetic.  Blood Thinner Instructions: N/A Aspirin Instructions: N/A  ERAS Protcol - Yes PRE-SURGERY Ensure or G2- Ensure given  COVID TEST- 10/23/19   Anesthesia review: Yes, review Echo.  Patient denies shortness of breath, fever, cough and chest pain at PAT appointment   All instructions explained to the patient, with a verbal understanding of the material. Patient agrees to go over the instructions while at home for a better understanding. Patient also instructed to self quarantine after being tested for COVID-19. The opportunity to ask questions was provided.

## 2019-10-24 NOTE — Progress Notes (Addendum)
Anesthesia Chart Review:  Follows with hematology for history of leukocytosis.  Last seen 10/10/2019 and at that time her CBC had normalized and work-up thus far has been benign.  Per note, next step would be a bone marrow biopsy but that was not felt necessary at this time due to normalization of her CBC.  History of bicuspid aortic valve.  She was seen by cardiology in May 2020 for evaluation.  Echo done 10/11/2018 showed normal LVEF 55-60%, bicuspid aortic valve without stenosis.  She also wore a 7-day event monitor for history of palpitations which showed only rare atrial and ventricular ectopy.  There were no specific arrhythmias noted.  No further work-up recommended.  Preop labs reviewed, unremarkable.  Event monitor 11/01/2018: 7 day event recorder reviewed. Sinus rhythm is present throughout with heart rate ranging from 61 bpm up to 134 bpm and average heart rate 91 bpm. There was only rare (<1%) atrial and ventricular ectopy and a single ventricular couplet. No sustained arrhythmias or pauses.  TTE 10/11/2018: 1. The left ventricle has normal systolic function, with an ejection  fraction of 55-60%. The cavity size was normal. There is mild concentric  left ventricular hypertrophy. Left ventricular diastolic parameters were  normal. No evidence of left  ventricular regional wall motion abnormalities.  2. The right ventricle has normal systolic function. The cavity was  normal. There is no increase in right ventricular wall thickness.  3. Left atrial size was mildly dilated.  4. The tricuspid valve is grossly normal.  5. The aortic valve is bicuspid. Mild thickening of the aortic valve. No  stenosis.  6. The aortic root is normal in size and structure.    Wynonia Musty [facility] Short Stay Center/Anesthesiology Phone [phone] 10/24/2019 8:45 AM

## 2019-10-24 NOTE — Anesthesia Preprocedure Evaluation (Addendum)
Anesthesia Evaluation  Patient identified by MRN, date of birth, ID band Patient awake    Reviewed: Allergy & Precautions, NPO status , Patient's Chart, lab work & pertinent test results  History of Anesthesia Complications Negative for: history of anesthetic complications  Airway Mallampati: II  TM Distance: >3 FB Neck ROM: Full    Dental  (+) Poor Dentition, Chipped, Missing, Dental Advisory Given   Pulmonary sleep apnea (does not use CPAP) , Current Smoker and Patient abstained from smoking.,  10/23/2019 SARS coronavirus NEG   breath sounds clear to auscultation       Cardiovascular + Valvular Problems/Murmurs (bicuspid aortic valve)  Rhythm:Regular Rate:Normal  '20 ECHO: EF 55-60%, bicuspid aortic valve with out stenosis   Neuro/Psych negative neurological ROS     GI/Hepatic Neg liver ROS, GERD  Controlled and Medicated,  Endo/Other  Morbid obesity  Renal/GU negative Renal ROS     Musculoskeletal   Abdominal (+) + obese,   Peds  Hematology negative hematology ROS (+)   Anesthesia Other Findings   Reproductive/Obstetrics                          Anesthesia Physical Anesthesia Plan  ASA: III  Anesthesia Plan: General   Post-op Pain Management:    Induction: Intravenous  PONV Risk Score and Plan: 2 and Ondansetron and Dexamethasone  Airway Management Planned: LMA  Additional Equipment: None  Intra-op Plan:   Post-operative Plan:   Informed Consent: I have reviewed the patients History and Physical, chart, labs and discussed the procedure including the risks, benefits and alternatives for the proposed anesthesia with the patient or authorized representative who has indicated his/her understanding and acceptance.     Dental advisory given  Plan Discussed with: CRNA and Surgeon  Anesthesia Plan Comments: (PAT note by Karoline Caldwell, PA-C: Follows with hematology for history of  leukocytosis.  Last seen 10/10/2019 and at that time her CBC had normalized and work-up thus far has been benign.  Per note, next step would be a bone marrow biopsy but that was not felt necessary at this time due to normalization of her CBC.  History of bicuspid aortic valve.  She was seen by cardiology in May 2020 for evaluation.  Echo done 10/11/2018 showed normal LVEF 55-60%, bicuspid aortic valve without stenosis.  She also wore a 7-day event monitor for history of palpitations which showed only rare atrial and ventricular ectopy.  There were no specific arrhythmias noted.  No further work-up recommended.  Preop labs reviewed, unremarkable.  Event monitor 11/01/2018: 7 day event recorder reviewed. Sinus rhythm is present throughout with heart rate ranging from 61 bpm up to 134 bpm and average heart rate 91 bpm. There was only rare (<1%) atrial and ventricular ectopy and a single ventricular couplet. No sustained arrhythmias or pauses.  TTE 10/11/2018: 1. The left ventricle has normal systolic function, with an ejection  fraction of 55-60%. The cavity size was normal. There is mild concentric  left ventricular hypertrophy. Left ventricular diastolic parameters were  normal. No evidence of left  ventricular regional wall motion abnormalities.  2. The right ventricle has normal systolic function. The cavity was  normal. There is no increase in right ventricular wall thickness.  3. Left atrial size was mildly dilated.  4. The tricuspid valve is grossly normal.  5. The aortic valve is bicuspid. Mild thickening of the aortic valve. No  stenosis.  6. The aortic root is normal in  size and structure.  )      Anesthesia Quick Evaluation

## 2019-10-25 ENCOUNTER — Encounter (HOSPITAL_COMMUNITY)
Admission: RE | Disposition: A | Discharge status: Home / Self Care | Payer: Self-pay | Source: Home / Self Care | Attending: Orthopedic Surgery

## 2019-10-25 ENCOUNTER — Other Ambulatory Visit: Payer: Self-pay

## 2019-10-25 ENCOUNTER — Ambulatory Visit (HOSPITAL_COMMUNITY): Payer: Medicaid Other | Admitting: Anesthesiology

## 2019-10-25 ENCOUNTER — Telehealth: Payer: Self-pay | Admitting: Orthopedic Surgery

## 2019-10-25 ENCOUNTER — Ambulatory Visit (HOSPITAL_COMMUNITY): Payer: Medicaid Other | Admitting: Physician Assistant

## 2019-10-25 ENCOUNTER — Encounter (HOSPITAL_COMMUNITY): Payer: Self-pay | Admitting: Orthopedic Surgery

## 2019-10-25 ENCOUNTER — Ambulatory Visit (HOSPITAL_COMMUNITY)
Admission: RE | Admit: 2019-10-25 | Discharge: 2019-10-25 | Disposition: A | Discharge status: Home / Self Care | Payer: Medicaid Other | Attending: Orthopedic Surgery | Admitting: Orthopedic Surgery

## 2019-10-25 DIAGNOSIS — S92352K Displaced fracture of fifth metatarsal bone, left foot, subsequent encounter for fracture with nonunion: Secondary | ICD-10-CM | POA: Diagnosis not present

## 2019-10-25 DIAGNOSIS — F419 Anxiety disorder, unspecified: Secondary | ICD-10-CM | POA: Insufficient documentation

## 2019-10-25 DIAGNOSIS — F17210 Nicotine dependence, cigarettes, uncomplicated: Secondary | ICD-10-CM | POA: Diagnosis not present

## 2019-10-25 DIAGNOSIS — F329 Major depressive disorder, single episode, unspecified: Secondary | ICD-10-CM | POA: Insufficient documentation

## 2019-10-25 DIAGNOSIS — Z6841 Body Mass Index (BMI) 40.0 and over, adult: Secondary | ICD-10-CM | POA: Diagnosis not present

## 2019-10-25 DIAGNOSIS — K219 Gastro-esophageal reflux disease without esophagitis: Secondary | ICD-10-CM | POA: Diagnosis not present

## 2019-10-25 DIAGNOSIS — S99192A Other physeal fracture of left metatarsal, initial encounter for closed fracture: Secondary | ICD-10-CM

## 2019-10-25 DIAGNOSIS — M5416 Radiculopathy, lumbar region: Secondary | ICD-10-CM | POA: Diagnosis not present

## 2019-10-25 DIAGNOSIS — X58XXXD Exposure to other specified factors, subsequent encounter: Secondary | ICD-10-CM | POA: Insufficient documentation

## 2019-10-25 DIAGNOSIS — Z79899 Other long term (current) drug therapy: Secondary | ICD-10-CM | POA: Insufficient documentation

## 2019-10-25 DIAGNOSIS — G4730 Sleep apnea, unspecified: Secondary | ICD-10-CM | POA: Insufficient documentation

## 2019-10-25 DIAGNOSIS — F418 Other specified anxiety disorders: Secondary | ICD-10-CM | POA: Diagnosis not present

## 2019-10-25 DIAGNOSIS — S99192D Other physeal fracture of left metatarsal, subsequent encounter for fracture with routine healing: Secondary | ICD-10-CM

## 2019-10-25 HISTORY — PX: ORIF TOE FRACTURE: SHX5032

## 2019-10-25 LAB — POCT PREGNANCY, URINE: Preg Test, Ur: NEGATIVE

## 2019-10-25 SURGERY — OPEN REDUCTION INTERNAL FIXATION (ORIF) METATARSAL (TOE) FRACTURE
Anesthesia: General | Site: Toe | Laterality: Left

## 2019-10-25 MED ORDER — MIDAZOLAM HCL 2 MG/2ML IJ SOLN: 0.5000 mg | Freq: Once | INTRAMUSCULAR | Status: DC | PRN

## 2019-10-25 MED ORDER — FENTANYL CITRATE (PF) 100 MCG/2ML IJ SOLN
INTRAMUSCULAR | Status: DC | PRN
  Administered 2019-10-25: 25 ug via INTRAVENOUS
  Administered 2019-10-25: 100 ug via INTRAVENOUS
  Administered 2019-10-25: 25 ug via INTRAVENOUS

## 2019-10-25 MED ORDER — PROPOFOL 10 MG/ML IV BOLUS
INTRAVENOUS | Status: DC | PRN
  Administered 2019-10-25: 50 mg via INTRAVENOUS
  Administered 2019-10-25: 150 mg via INTRAVENOUS

## 2019-10-25 MED ORDER — LIDOCAINE 2% (20 MG/ML) 5 ML SYRINGE
INTRAMUSCULAR | Status: DC | PRN
  Administered 2019-10-25: 20 mg via INTRAVENOUS

## 2019-10-25 MED ORDER — CHLORHEXIDINE GLUCONATE 0.12 % MT SOLN
15.0000 mL | Freq: Once | OROMUCOSAL | Status: AC
  Administered 2019-10-25: 15 mL via OROMUCOSAL
  Filled 2019-10-25: qty 15

## 2019-10-25 MED ORDER — LACTATED RINGERS IV SOLN: INTRAVENOUS | Status: DC | PRN

## 2019-10-25 MED ORDER — HYDROMORPHONE HCL 1 MG/ML IJ SOLN
0.2500 mg | INTRAMUSCULAR | Status: DC | PRN
  Administered 2019-10-25: 0.5 mg via INTRAVENOUS

## 2019-10-25 MED ORDER — OXYCODONE-ACETAMINOPHEN 5-325 MG PO TABS
1.0000 | ORAL_TABLET | ORAL | 0 refills | Status: DC | PRN
Start: 2019-10-25 — End: 2019-10-31

## 2019-10-25 MED ORDER — MIDAZOLAM HCL 2 MG/2ML IJ SOLN
INTRAMUSCULAR | Status: AC
  Filled 2019-10-25: qty 2

## 2019-10-25 MED ORDER — ONDANSETRON HCL 4 MG/2ML IJ SOLN
INTRAMUSCULAR | Status: AC
  Filled 2019-10-25: qty 2

## 2019-10-25 MED ORDER — LIDOCAINE 2% (20 MG/ML) 5 ML SYRINGE
INTRAMUSCULAR | Status: AC
  Filled 2019-10-25: qty 5

## 2019-10-25 MED ORDER — OXYCODONE HCL 5 MG/5ML PO SOLN: 5.0000 mg | Freq: Once | ORAL | Status: AC | PRN

## 2019-10-25 MED ORDER — DEXAMETHASONE SODIUM PHOSPHATE 4 MG/ML IJ SOLN
INTRAMUSCULAR | Status: DC | PRN
  Administered 2019-10-25: 5 mg via INTRAVENOUS

## 2019-10-25 MED ORDER — SODIUM CHLORIDE 0.9 % IR SOLN
Status: DC | PRN
  Administered 2019-10-25: 1000 mL

## 2019-10-25 MED ORDER — HYDROMORPHONE HCL 1 MG/ML IJ SOLN
INTRAMUSCULAR | Status: AC
  Administered 2019-10-25: 0.5 mg via INTRAVENOUS
  Filled 2019-10-25: qty 1

## 2019-10-25 MED ORDER — MIDAZOLAM HCL 5 MG/5ML IJ SOLN
INTRAMUSCULAR | Status: DC | PRN
  Administered 2019-10-25: 2 mg via INTRAVENOUS

## 2019-10-25 MED ORDER — MEPERIDINE HCL 25 MG/ML IJ SOLN: 6.2500 mg | INTRAMUSCULAR | Status: DC | PRN

## 2019-10-25 MED ORDER — FENTANYL CITRATE (PF) 250 MCG/5ML IJ SOLN
INTRAMUSCULAR | Status: AC
  Filled 2019-10-25: qty 5

## 2019-10-25 MED ORDER — ONDANSETRON HCL 4 MG/2ML IJ SOLN
INTRAMUSCULAR | Status: DC | PRN
  Administered 2019-10-25: 4 mg via INTRAVENOUS

## 2019-10-25 MED ORDER — PROPOFOL 10 MG/ML IV BOLUS
INTRAVENOUS | Status: AC
  Filled 2019-10-25: qty 40

## 2019-10-25 MED ORDER — ORAL CARE MOUTH RINSE: 15.0000 mL | Freq: Once | OROMUCOSAL | Status: AC

## 2019-10-25 MED ORDER — CEFAZOLIN SODIUM-DEXTROSE 2-4 GM/100ML-% IV SOLN
2.0000 g | INTRAVENOUS | Status: AC
  Administered 2019-10-25: 2 g via INTRAVENOUS
  Filled 2019-10-25: qty 100

## 2019-10-25 MED ORDER — DEXAMETHASONE SODIUM PHOSPHATE 10 MG/ML IJ SOLN
INTRAMUSCULAR | Status: AC
  Filled 2019-10-25: qty 1

## 2019-10-25 MED ORDER — OXYCODONE HCL 5 MG PO TABS: 5.0000 mg | ORAL_TABLET | Freq: Once | ORAL | Status: AC | PRN

## 2019-10-25 MED ORDER — PROMETHAZINE HCL 25 MG/ML IJ SOLN: 6.2500 mg | INTRAMUSCULAR | Status: DC | PRN

## 2019-10-25 MED ORDER — OXYCODONE HCL 5 MG PO TABS
ORAL_TABLET | ORAL | Status: AC
  Administered 2019-10-25: 5 mg via ORAL
  Filled 2019-10-25: qty 1

## 2019-10-25 SURGICAL SUPPLY — 33 items
BIT DRILL 3.2X150 CANNULATED (BIT) ×1 IMPLANT
BLADE AVERAGE 25X9 (BLADE) ×2 IMPLANT
BLADE MINI RND TIP GREEN BEAV (BLADE) IMPLANT
BNDG ELASTIC 4X5.8 VLCR STR LF (GAUZE/BANDAGES/DRESSINGS) ×2 IMPLANT
BNDG GAUZE ELAST 4 BULKY (GAUZE/BANDAGES/DRESSINGS) ×2 IMPLANT
COVER SURGICAL LIGHT HANDLE (MISCELLANEOUS) ×2 IMPLANT
DRAPE OEC MINIVIEW 54X84 (DRAPES) ×2 IMPLANT
DRAPE U-SHAPE 47X51 STRL (DRAPES) ×2 IMPLANT
DRILL 3.2X150 CANNULATED (BIT) ×2
DRSG EMULSION OIL 3X3 NADH (GAUZE/BANDAGES/DRESSINGS) ×2 IMPLANT
DURAPREP 26ML APPLICATOR (WOUND CARE) ×2 IMPLANT
ELECT REM PT RETURN 9FT ADLT (ELECTROSURGICAL) ×2
ELECTRODE REM PT RTRN 9FT ADLT (ELECTROSURGICAL) ×1 IMPLANT
GAUZE SPONGE 4X4 12PLY STRL LF (GAUZE/BANDAGES/DRESSINGS) ×2 IMPLANT
GLOVE BIOGEL PI IND STRL 9 (GLOVE) ×1 IMPLANT
GLOVE BIOGEL PI INDICATOR 9 (GLOVE) ×1
GLOVE SURG ORTHO 9.0 STRL STRW (GLOVE) ×2 IMPLANT
GOWN STRL REUS W/ TWL XL LVL3 (GOWN DISPOSABLE) ×2 IMPLANT
GOWN STRL REUS W/TWL XL LVL3 (GOWN DISPOSABLE) ×2
KIT BASIN OR (CUSTOM PROCEDURE TRAY) ×2 IMPLANT
KIT TURNOVER KIT B (KITS) ×2 IMPLANT
NS IRRIG 1000ML POUR BTL (IV SOLUTION) ×2 IMPLANT
PACK ORTHO EXTREMITY (CUSTOM PROCEDURE TRAY) ×2 IMPLANT
PAD ARMBOARD 7.5X6 YLW CONV (MISCELLANEOUS) ×4 IMPLANT
PADDING CAST SYNTHETIC 4 (CAST SUPPLIES) ×1
PADDING CAST SYNTHETIC 4X4 STR (CAST SUPPLIES) ×1 IMPLANT
SCREW SOLID MONSTER BT 4.5X50 (Screw) ×2 IMPLANT
SUT ETHILON 2 0 PSLX (SUTURE) ×4 IMPLANT
TAP 4.5MM CANNULATED (TAP) ×2 IMPLANT
TOWEL GREEN STERILE (TOWEL DISPOSABLE) ×2 IMPLANT
TOWEL GREEN STERILE FF (TOWEL DISPOSABLE) ×2 IMPLANT
TUBE CONNECTING 12X1/4 (SUCTIONS) ×2 IMPLANT
WIRE SMOOTH NITINOL 1.6X200 (WIRE) ×2 IMPLANT

## 2019-10-25 NOTE — Telephone Encounter (Signed)
Patient called. [facility] does not have any of the medication that was prescribed to her. Her call back number is [phone]

## 2019-10-25 NOTE — Telephone Encounter (Signed)
Pt had ORIF 5th MT fx today states that rx sent to pharm after surgery were not received.

## 2019-10-25 NOTE — Telephone Encounter (Signed)
Noted  

## 2019-10-25 NOTE — Op Note (Addendum)
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    The risks benefits and alternatives were discussed with the patient preoperatively including but not limited to the risks of infection, bleeding, nerve injury, cardiopulmonary complications, the need for revision surgery, among others, and the patient was willing to proceed.  OPERATIVE IMPLANTS: 50 mm solid Biomet screw, 4.5 mm in diameter  @ENCIMAGES @  OPERATIVE FINDINGS: Postop radiograph shows stable alignment in both AP and lateral planes.  OPERATIVE PROCEDURE: Patient was brought the operating room and underwent a general anesthetic.  After adequate levels anesthesia were obtained patient's left lower extremity was prepped using DuraPrep draped into a sterile field a timeout was called.  A guidewire was inserted high and inside on the base of the fifth metatarsal.  C-arm fluoroscopy verified alignment of the guidewire across the fracture site.  This was then overdrilled with a 3.2 mm drill tapped with a 4.5 mm tap and a 50 mm solid screw was inserted.  This had a good compression fit C-arm fluoroscopy verified alignment.  The incision was closed using 2-0 nylon a sterile dressing was applied patient was extubated taken to PACU in stable condition   DISCHARGE PLANNING:  Antibiotic duration: Preoperative antibiotics  Weightbearing: Touchdown weightbearing on the left  Pain medication: Prescription for Percocet  Dressing care/ Wound VAC:  Follow-up in the office 1 week to change the dressing  Ambulatory devices: Patient has difficulty with crutches recommended a kneeling scooter  Discharge to: Home.  Follow-up: In the office 1 week post operative.

## 2019-10-25 NOTE — Anesthesia Postprocedure Evaluation (Signed)
Anesthesia Post Note  Patient: Taylor [name]  Procedure(s) Performed: OPEN REDUCTION INTERNAL FIXATION (ORIF) LEFT 5TH METATARSAL (TOE) FRACTURE (Left Toe)     Patient location during evaluation: PACU Anesthesia Type: General Level of consciousness: awake and alert, oriented and patient cooperative Pain management: pain level controlled Vital Signs Assessment: post-procedure vital signs reviewed and stable Respiratory status: spontaneous breathing, nonlabored ventilation and respiratory function stable Cardiovascular status: blood pressure returned to baseline and stable Postop Assessment: no apparent nausea or vomiting and adequate PO intake Anesthetic complications: no   No complications documented.  Last Vitals:  Vitals:   10/25/19 0950 10/25/19 1008  BP: (!) 130/94 129/89  Pulse: 85 76  Resp: (!) 22 20  Temp: (!) 36.1 C 36.7 C  SpO2: 92% 94%    Last Pain:  Vitals:   10/25/19 1008  TempSrc:   PainSc: 5                  Leshon Armistead,E. Kaloni Bisaillon
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
## 2019-10-25 NOTE — Anesthesia Procedure Notes (Signed)
Procedure Name: LMA Insertion Date/Time: 10/25/2019 8:42 AM Performed by: Alain Marion, CRNA Pre-anesthesia Checklist: Patient identified, Emergency Drugs available, Suction available and Patient being monitored Patient Re-evaluated:Patient Re-evaluated prior to induction Oxygen Delivery Method: Circle System Utilized Preoxygenation: Pre-oxygenation with 100% oxygen Induction Type: IV induction Ventilation: Mask ventilation without difficulty LMA: LMA inserted LMA Size: 4.0 Number of attempts: 1 Airway Equipment and Method: Bite block Placement Confirmation: positive ETCO2 and breath sounds checked- equal and bilateral Tube secured with: Tape Dental Injury: Teeth and Oropharynx as per pre-operative assessment  Comments: Performed by Reece Agar, SRNA under supervision CRNA and MDA

## 2019-10-25 NOTE — Transfer of Care (Signed)
Immediate Anesthesia Transfer of Care Note  Patient: Taylor [name]  Procedure(s) Performed: OPEN REDUCTION INTERNAL FIXATION (ORIF) LEFT 5TH METATARSAL (TOE) FRACTURE (Left Toe)  Patient Location: PACU  Anesthesia Type:General  Level of Consciousness: awake and alert   Airway & Oxygen Therapy: Patient Spontanous Breathing and Patient connected to face mask oxygen  Post-op Assessment: Report given to RN, Post -op Vital signs reviewed and stable and Patient moving all extremities  Post vital signs: Reviewed and stable  Last Vitals:  Vitals Value Taken Time  BP 134/74 10/25/19 0920  Temp    Pulse 95 10/25/19 0924  Resp 15 10/25/19 0924  SpO2 100 % 10/25/19 0924  Vitals shown include unvalidated device data.  Last Pain:  Vitals:   10/25/19 0711  TempSrc:   PainSc: 5          Complications: No complications documented.

## 2019-10-25 NOTE — Telephone Encounter (Signed)
Patient called and ask we disregard last message. Patient stated pharmacy does have medications in stock just needed to unpack.

## 2019-10-25 NOTE — Progress Notes (Signed)
Orthopedic Tech Progress Note Patient Details:  Taylor [name] 1991/01/27 [id number]  Ortho Devices Type of Ortho Device: Postop shoe/boot Ortho Device/Splint Location: LLE Ortho Device/Splint Interventions: Application, Adjustment   Post Interventions Patient Tolerated: Well Instructions Provided: Care of device   Janit Pagan 10/25/2019, 10:22 AM

## 2019-10-26 ENCOUNTER — Encounter (HOSPITAL_COMMUNITY): Payer: Self-pay | Admitting: Orthopedic Surgery

## 2019-10-27 ENCOUNTER — Encounter: Payer: Self-pay | Admitting: Orthopedic Surgery

## 2019-10-31 ENCOUNTER — Telehealth: Payer: Self-pay

## 2019-10-31 ENCOUNTER — Encounter: Payer: Self-pay | Admitting: Orthopedic Surgery

## 2019-10-31 ENCOUNTER — Other Ambulatory Visit: Payer: Self-pay | Admitting: Physician Assistant

## 2019-10-31 ENCOUNTER — Ambulatory Visit (INDEPENDENT_AMBULATORY_CARE_PROVIDER_SITE_OTHER): Payer: Medicaid Other | Admitting: Physician Assistant

## 2019-10-31 ENCOUNTER — Other Ambulatory Visit: Payer: Self-pay

## 2019-10-31 VITALS — Ht 65.0 in | Wt 252.0 lb

## 2019-10-31 DIAGNOSIS — M79672 Pain in left foot: Secondary | ICD-10-CM

## 2019-10-31 MED ORDER — OXYCODONE-ACETAMINOPHEN 5-325 MG PO TABS: 1.0000 | ORAL_TABLET | ORAL | 0 refills | Status: DC | PRN
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
## 2019-10-31 NOTE — Telephone Encounter (Signed)
Patient called in very upset because the pharmacy doesn't have her meds until July 22nd. Says she is in a lot of pain and would like her prescription to be sent to [facility] in [place] on [address]. Patient also wanted to be called when prescription is sent & ready for pick up.

## 2019-10-31 NOTE — Telephone Encounter (Signed)
Please see message below

## 2019-10-31 NOTE — Telephone Encounter (Signed)
Rx called in 

## 2019-10-31 NOTE — Telephone Encounter (Signed)
Called pt and advised that this has been sent to pharm.  

## 2019-11-03 ENCOUNTER — Encounter: Payer: Self-pay | Admitting: Orthopedic Surgery

## 2019-11-07 ENCOUNTER — Other Ambulatory Visit: Payer: Self-pay

## 2019-11-07 ENCOUNTER — Encounter: Payer: Self-pay | Admitting: Physician Assistant

## 2019-11-07 ENCOUNTER — Ambulatory Visit (INDEPENDENT_AMBULATORY_CARE_PROVIDER_SITE_OTHER): Payer: Medicaid Other | Admitting: Orthopaedic Surgery

## 2019-11-07 ENCOUNTER — Ambulatory Visit: Payer: Medicaid Other

## 2019-11-07 ENCOUNTER — Ambulatory Visit (INDEPENDENT_AMBULATORY_CARE_PROVIDER_SITE_OTHER): Payer: Medicaid Other

## 2019-11-07 ENCOUNTER — Ambulatory Visit (INDEPENDENT_AMBULATORY_CARE_PROVIDER_SITE_OTHER): Payer: Medicaid Other | Admitting: Orthopedic Surgery

## 2019-11-07 ENCOUNTER — Encounter: Payer: Self-pay | Admitting: Orthopaedic Surgery

## 2019-11-07 VITALS — BP 123/86 | HR 93 | Ht 65.0 in | Wt 252.0 lb

## 2019-11-07 VITALS — Ht 65.0 in | Wt 252.0 lb

## 2019-11-07 DIAGNOSIS — S32301D Unspecified fracture of right ilium, subsequent encounter for fracture with routine healing: Secondary | ICD-10-CM

## 2019-11-07 DIAGNOSIS — K219 Gastro-esophageal reflux disease without esophagitis: Secondary | ICD-10-CM

## 2019-11-07 DIAGNOSIS — S92352K Displaced fracture of fifth metatarsal bone, left foot, subsequent encounter for fracture with nonunion: Secondary | ICD-10-CM | POA: Diagnosis not present

## 2019-11-07 MED ORDER — OXYCODONE-ACETAMINOPHEN 5-325 MG PO TABS: 1.0000 | ORAL_TABLET | ORAL | 0 refills | Status: DC | PRN

## 2019-11-07 NOTE — Progress Notes (Signed)
I have little pain in my pelvis  She had recent left foot surgery.  She is in a post op boot and using a leg scooter.  She says her pelvis is not that painful.  I told her I cannot appreciate the fracture on the x-rays that was seen on the MRI.  Encounter Diagnosis  Name Primary?  . Closed nondisplaced fracture of right ilium with routine healing, unspecified fracture morphology, subsequent encounter Yes   Return in one month.  X-rays of pelvis then.  Call if any problem.  Precautions discussed.   Electronically Signed Sanjuana Kava, MD 6/29/202110:49 AM
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
## 2019-11-08 DIAGNOSIS — M79604 Pain in right leg: Secondary | ICD-10-CM | POA: Diagnosis not present

## 2019-11-08 DIAGNOSIS — K219 Gastro-esophageal reflux disease without esophagitis: Secondary | ICD-10-CM | POA: Diagnosis not present

## 2019-11-08 DIAGNOSIS — M79672 Pain in left foot: Secondary | ICD-10-CM | POA: Diagnosis not present

## 2019-11-08 DIAGNOSIS — F341 Dysthymic disorder: Secondary | ICD-10-CM | POA: Diagnosis not present

## 2019-11-08 DIAGNOSIS — Z79891 Long term (current) use of opiate analgesic: Secondary | ICD-10-CM | POA: Diagnosis not present

## 2019-11-08 DIAGNOSIS — G4700 Insomnia, unspecified: Secondary | ICD-10-CM | POA: Diagnosis not present

## 2019-11-08 DIAGNOSIS — M461 Sacroiliitis, not elsewhere classified: Secondary | ICD-10-CM | POA: Diagnosis not present

## 2019-11-08 DIAGNOSIS — M545 Low back pain: Secondary | ICD-10-CM | POA: Diagnosis not present

## 2019-11-08 DIAGNOSIS — M5416 Radiculopathy, lumbar region: Secondary | ICD-10-CM | POA: Diagnosis not present

## 2019-11-08 MED ORDER — OMEPRAZOLE 40 MG PO CPDR: 40.0000 mg | DELAYED_RELEASE_CAPSULE | Freq: Every day | ORAL | 3 refills | Status: DC

## 2019-11-08 MED ORDER — BUSPIRONE HCL 15 MG PO TABS: 15.0000 mg | ORAL_TABLET | Freq: Three times a day (TID) | ORAL | 1 refills | Status: DC

## 2019-11-08 NOTE — Addendum Note (Signed)
Addended by: Michaela Corner on: 11/08/2019 10:21 AM   Modules accepted: Orders

## 2019-11-22 ENCOUNTER — Telehealth: Payer: Self-pay

## 2019-11-22 NOTE — Telephone Encounter (Signed)
Patient was called today due to her mychart message not being read by her. I was calling to seek if she has received what she needed for her scooter and to clarify her next appointment. LVM stating she could call our office for follow-up.

## 2019-11-28 ENCOUNTER — Ambulatory Visit (INDEPENDENT_AMBULATORY_CARE_PROVIDER_SITE_OTHER): Payer: Medicaid Other

## 2019-11-28 ENCOUNTER — Ambulatory Visit (INDEPENDENT_AMBULATORY_CARE_PROVIDER_SITE_OTHER): Payer: Medicaid Other | Admitting: Physician Assistant

## 2019-11-28 ENCOUNTER — Other Ambulatory Visit: Payer: Self-pay

## 2019-11-28 ENCOUNTER — Encounter: Payer: Self-pay | Admitting: Physician Assistant

## 2019-11-28 VITALS — Ht 65.0 in | Wt 252.0 lb

## 2019-11-28 DIAGNOSIS — S92352K Displaced fracture of fifth metatarsal bone, left foot, subsequent encounter for fracture with nonunion: Secondary | ICD-10-CM
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
## 2019-12-05 ENCOUNTER — Ambulatory Visit (INDEPENDENT_AMBULATORY_CARE_PROVIDER_SITE_OTHER): Payer: Medicaid Other | Admitting: Orthopaedic Surgery

## 2019-12-05 ENCOUNTER — Ambulatory Visit: Payer: Medicaid Other

## 2019-12-05 ENCOUNTER — Encounter: Payer: Self-pay | Admitting: Orthopaedic Surgery

## 2019-12-05 ENCOUNTER — Other Ambulatory Visit: Payer: Self-pay

## 2019-12-05 DIAGNOSIS — S32301D Unspecified fracture of right ilium, subsequent encounter for fracture with routine healing: Secondary | ICD-10-CM

## 2019-12-05 DIAGNOSIS — M5416 Radiculopathy, lumbar region: Secondary | ICD-10-CM | POA: Diagnosis not present

## 2019-12-05 DIAGNOSIS — M545 Low back pain: Secondary | ICD-10-CM | POA: Diagnosis not present

## 2019-12-05 DIAGNOSIS — G4700 Insomnia, unspecified: Secondary | ICD-10-CM | POA: Diagnosis not present

## 2019-12-05 DIAGNOSIS — K219 Gastro-esophageal reflux disease without esophagitis: Secondary | ICD-10-CM | POA: Diagnosis not present

## 2019-12-05 DIAGNOSIS — Z79891 Long term (current) use of opiate analgesic: Secondary | ICD-10-CM | POA: Diagnosis not present

## 2019-12-05 DIAGNOSIS — M79604 Pain in right leg: Secondary | ICD-10-CM | POA: Diagnosis not present

## 2019-12-05 DIAGNOSIS — F341 Dysthymic disorder: Secondary | ICD-10-CM | POA: Diagnosis not present

## 2019-12-05 DIAGNOSIS — M461 Sacroiliitis, not elsewhere classified: Secondary | ICD-10-CM | POA: Diagnosis not present

## 2019-12-05 DIAGNOSIS — M79672 Pain in left foot: Secondary | ICD-10-CM | POA: Diagnosis not present

## 2019-12-05 NOTE — Progress Notes (Signed)
I have less pain  She is doing better with the pelvis fracture.  NV intact. She uses a cane.  X-rays were done of the pelvis, reported separately.  Encounter Diagnosis  Name Primary?  . Closed nondisplaced fracture of right ilium with routine healing, unspecified fracture morphology, subsequent encounter Yes   Return in six weeks.  No X-rays needed then.  Come off cane as she improves.  Call if any problem.  Precautions discussed.   Electronically Signed Sanjuana Kava, MD 7/27/202110:14 AM

## 2019-12-26 ENCOUNTER — Encounter: Payer: Self-pay | Admitting: Physician Assistant

## 2019-12-26 ENCOUNTER — Other Ambulatory Visit: Payer: Self-pay | Admitting: Family

## 2019-12-26 ENCOUNTER — Ambulatory Visit (INDEPENDENT_AMBULATORY_CARE_PROVIDER_SITE_OTHER): Payer: Medicaid Other | Admitting: Physician Assistant

## 2019-12-26 ENCOUNTER — Ambulatory Visit (INDEPENDENT_AMBULATORY_CARE_PROVIDER_SITE_OTHER): Payer: Medicaid Other

## 2019-12-26 VITALS — Ht 65.0 in | Wt 252.0 lb

## 2019-12-26 DIAGNOSIS — G8929 Other chronic pain: Secondary | ICD-10-CM

## 2019-12-26 DIAGNOSIS — M79672 Pain in left foot: Secondary | ICD-10-CM

## 2019-12-26 DIAGNOSIS — M5441 Lumbago with sciatica, right side: Secondary | ICD-10-CM
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
## 2020-01-02 DIAGNOSIS — M5416 Radiculopathy, lumbar region: Secondary | ICD-10-CM | POA: Diagnosis not present

## 2020-01-02 DIAGNOSIS — K219 Gastro-esophageal reflux disease without esophagitis: Secondary | ICD-10-CM | POA: Diagnosis not present

## 2020-01-02 DIAGNOSIS — G4700 Insomnia, unspecified: Secondary | ICD-10-CM | POA: Diagnosis not present

## 2020-01-02 DIAGNOSIS — M545 Low back pain: Secondary | ICD-10-CM | POA: Diagnosis not present

## 2020-01-02 DIAGNOSIS — F341 Dysthymic disorder: Secondary | ICD-10-CM | POA: Diagnosis not present

## 2020-01-02 DIAGNOSIS — Z79891 Long term (current) use of opiate analgesic: Secondary | ICD-10-CM | POA: Diagnosis not present

## 2020-01-02 DIAGNOSIS — M79672 Pain in left foot: Secondary | ICD-10-CM | POA: Diagnosis not present

## 2020-01-02 DIAGNOSIS — M461 Sacroiliitis, not elsewhere classified: Secondary | ICD-10-CM | POA: Diagnosis not present

## 2020-01-02 DIAGNOSIS — M79604 Pain in right leg: Secondary | ICD-10-CM | POA: Diagnosis not present

## 2020-01-09 ENCOUNTER — Encounter: Payer: Self-pay | Admitting: Orthopedic Surgery

## 2020-01-09 ENCOUNTER — Ambulatory Visit (INDEPENDENT_AMBULATORY_CARE_PROVIDER_SITE_OTHER): Payer: Medicaid Other | Admitting: Orthopedic Surgery

## 2020-01-09 DIAGNOSIS — S92352K Displaced fracture of fifth metatarsal bone, left foot, subsequent encounter for fracture with nonunion: Secondary | ICD-10-CM
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
## 2020-01-11 ENCOUNTER — Ambulatory Visit: Payer: Medicaid Other | Admitting: Adult Health

## 2020-01-13 ENCOUNTER — Other Ambulatory Visit: Payer: Self-pay | Admitting: Family

## 2020-01-14 ENCOUNTER — Telehealth: Payer: Medicaid Other | Admitting: Family

## 2020-01-14 DIAGNOSIS — J069 Acute upper respiratory infection, unspecified: Secondary | ICD-10-CM

## 2020-01-14 MED ORDER — AZITHROMYCIN 250 MG PO TABS
ORAL_TABLET | ORAL | 0 refills | Status: DC
Start: 2020-01-14 — End: 2020-01-30

## 2020-01-14 MED ORDER — PREDNISONE 10 MG (21) PO TBPK
ORAL_TABLET | ORAL | 0 refills | Status: DC
Start: 2020-01-14 — End: 2020-01-30

## 2020-01-14 MED ORDER — FLUTICASONE PROPIONATE 50 MCG/ACT NA SUSP
2.0000 | Freq: Every day | NASAL | 6 refills | Status: DC
Start: 2020-01-14 — End: 2020-01-30

## 2020-01-14 MED ORDER — BENZONATATE 100 MG PO CAPS
100.0000 mg | ORAL_CAPSULE | Freq: Three times a day (TID) | ORAL | 0 refills | Status: DC | PRN
Start: 2020-01-14 — End: 2020-01-30

## 2020-01-14 NOTE — Addendum Note (Signed)
Addended by: Evelina Dun A on: 01/14/2020 02:33 PM   Modules accepted: Orders

## 2020-01-14 NOTE — Progress Notes (Signed)
We are sorry you are not feeling well.  Here is how we plan to help!  Based on what you have shared with me, it looks like you may have a viral upper respiratory infection.  Upper respiratory infections are caused by a large number of viruses; however, rhinovirus is the most common cause.   Symptoms vary from person to person, with common symptoms including sore throat, cough, fatigue or lack of energy and feeling of general discomfort.  A low-grade fever of up to 100.4 may present, but is often uncommon.  Symptoms vary however, and are closely related to a person's age or underlying illnesses.  The most common symptoms associated with an upper respiratory infection are nasal discharge or congestion, cough, sneezing, headache and pressure in the ears and face.  These symptoms usually persist for about 3 to 10 days, but can last up to 2 weeks.  It is important to know that upper respiratory infections do not cause serious illness or complications in most cases.    Upper respiratory infections can be transmitted from person to person, with the most common method of transmission being a person's hands.  The virus is able to live on the skin and can infect other persons for up to 2 hours after direct contact.  Also, these can be transmitted when someone coughs or sneezes; thus, it is important to cover the mouth to reduce this risk.  To keep the spread of the illness at bay, good hand hygiene is very important.  This is an infection that is most likely caused by a virus. There are no specific treatments other than to help you with the symptoms until the infection runs its course.  We are sorry you are not feeling well.  Here is how we plan to help!   For nasal congestion, you may use an oral decongestants such as Mucinex D or if you have glaucoma or high blood pressure use plain Mucinex.  Saline nasal spray or nasal drops can help and can safely be used as often as needed for congestion.  For your congestion,  I have prescribed Fluticasone nasal spray one spray in each nostril twice a day  If you do not have a history of heart disease, hypertension, diabetes or thyroid disease, prostate/bladder issues or glaucoma, you may also use Sudafed to treat nasal congestion.  It is highly recommended that you consult with a pharmacist or your primary care physician to ensure this medication is safe for you to take.     If you have a cough, you may use cough suppressants such as Delsym and Robitussin.  If you have glaucoma or high blood pressure, you can also use Coricidin HBP.   For cough I have prescribed for you A prescription cough medication called Tessalon Perles 100 mg. You may take 1-2 capsules every 8 hours as needed for cough  If you have a sore or scratchy throat, use a saltwater gargle-  to  teaspoon of salt dissolved in a 4-ounce to 8-ounce glass of warm water.  Gargle the solution for approximately 15-30 seconds and then spit.  It is important not to swallow the solution.  You can also use throat lozenges/cough drops and Chloraseptic spray to help with throat pain or discomfort.  Warm or cold liquids can also be helpful in relieving throat pain.  For headache, pain or general discomfort, you can use Ibuprofen or Tylenol as directed.   Some authorities believe that zinc sprays or the use of   Echinacea may shorten the course of your symptoms.   HOME CARE . Only take medications as instructed by your medical team. . Be sure to drink plenty of fluids. Water is fine as well as fruit juices, sodas and electrolyte beverages. You may want to stay away from caffeine or alcohol. If you are nauseated, try taking small sips of liquids. How do you know if you are getting enough fluid? Your urine should be a pale yellow or almost colorless. . Get rest. . Taking a steamy shower or using a humidifier may help nasal congestion and ease sore throat pain. You can place a towel over your head and breathe in the steam  from hot water coming from a faucet. . Using a saline nasal spray works much the same way. . Cough drops, hard candies and sore throat lozenges may ease your cough. . Avoid close contacts especially the very young and the elderly . Cover your mouth if you cough or sneeze . Always remember to wash your hands.   GET HELP RIGHT AWAY IF: . You develop worsening fever. . If your symptoms do not improve within 10 days . You develop yellow or green discharge from your nose over 3 days. . You have coughing fits . You develop a severe head ache or visual changes. . You develop shortness of breath, difficulty breathing or start having chest pain . Your symptoms persist after you have completed your treatment plan  MAKE SURE YOU   Understand these instructions.  Will watch your condition.  Will get help right away if you are not doing well or get worse.  Your e-visit answers were reviewed by a board certified advanced clinical practitioner to complete your personal care plan. Depending upon the condition, your plan could have included both over the counter or prescription medications. Please review your pharmacy choice. If there is a problem, you may call our nursing hot line at and have the prescription routed to another pharmacy. Your safety is important to us. If you have drug allergies check your prescription carefully.   You can use MyChart to ask questions about today's visit, request a non-urgent call back, or ask for a work or school excuse for 24 hours related to this e-Visit. If it has been greater than 24 hours you will need to follow up with your provider, or enter a new e-Visit to address those concerns. You will get an e-mail in the next two days asking about your experience.  I hope that your e-visit has been valuable and will speed your recovery. Thank you for using e-visits.  Approximately 5 minutes was spent documenting and reviewing patient's chart.      

## 2020-01-16 ENCOUNTER — Ambulatory Visit: Payer: Medicaid Other | Admitting: Orthopaedic Surgery

## 2020-01-16 ENCOUNTER — Inpatient Hospital Stay (HOSPITAL_COMMUNITY): Payer: Medicaid Other

## 2020-01-22 ENCOUNTER — Ambulatory Visit (HOSPITAL_COMMUNITY): Payer: Medicaid Other | Admitting: Hematology

## 2020-01-22 ENCOUNTER — Ambulatory Visit: Payer: Medicaid Other | Admitting: Adult Health

## 2020-01-23 DIAGNOSIS — R110 Nausea: Secondary | ICD-10-CM | POA: Diagnosis not present

## 2020-01-23 DIAGNOSIS — R5383 Other fatigue: Secondary | ICD-10-CM | POA: Diagnosis not present

## 2020-01-23 DIAGNOSIS — R531 Weakness: Secondary | ICD-10-CM | POA: Diagnosis not present

## 2020-01-23 DIAGNOSIS — R52 Pain, unspecified: Secondary | ICD-10-CM | POA: Diagnosis not present

## 2020-01-23 DIAGNOSIS — J40 Bronchitis, not specified as acute or chronic: Secondary | ICD-10-CM | POA: Diagnosis not present

## 2020-01-30 ENCOUNTER — Encounter: Payer: Self-pay | Admitting: Adult Health

## 2020-01-30 ENCOUNTER — Ambulatory Visit (INDEPENDENT_AMBULATORY_CARE_PROVIDER_SITE_OTHER): Payer: Medicaid Other | Admitting: Adult Health

## 2020-01-30 VITALS — BP 130/99 | HR 90 | Ht 64.0 in | Wt 264.5 lb

## 2020-01-30 DIAGNOSIS — K649 Unspecified hemorrhoids: Secondary | ICD-10-CM | POA: Diagnosis not present

## 2020-01-30 DIAGNOSIS — Z975 Presence of (intrauterine) contraceptive device: Secondary | ICD-10-CM | POA: Diagnosis not present

## 2020-01-30 DIAGNOSIS — R102 Pelvic and perineal pain: Secondary | ICD-10-CM | POA: Insufficient documentation

## 2020-01-30 NOTE — Progress Notes (Signed)
  Subjective:     Patient ID: Taylor [name], female   DOB: 1991-01-02, 29 y.o.   MRN: [id number]  HPI [name] is a 29 year old white female,single, G2P2 in complaining of pelvic pain, sharpe at times and radiates to both sides esp to the right. Has noticed for about 4-6 weeks. PCP is Evelina Dun NP.  Review of Systems Pelvic pain for 4-6 weeks  +hemorrhoids  Reviewed past medical,surgical, social and family history. Reviewed medications and allergies.     Objective:   Physical Exam BP (!) 130/99 (BP Location: Left Arm, Patient Position: Sitting, Cuff Size: Large)   Pulse 90   Ht 5\' 4"  (1.626 m)   Wt 264 lb 8 oz (120 kg)   Breastfeeding No   BMI 45.40 kg/m  Skin warm and dry.Pelvic: external genitalia is normal in appearance no lesions, vagina: scant discharge with odor,urethra has no lesions or masses noted, cervix:smooth and bulbous,+IUD strings at os, uterus: normal size, shape and contour, non tender, no masses felt, adnexa: no masses or tenderness noted. Bladder is non tender and no masses felt. +external hemorrhoid  Examination chaperoned by Levy Pupa LPN   [facility] - 92/95/74 1345      Pregnancy Intention Screening   Does the patient want to become pregnant in the next year? No    Does the patient's partner want to become pregnant in the next year? No    Would the patient like to discuss contraceptive options today? No      Contraception Wrap Up   Current Method IUD or IUS    End Method IUD or IUS    Contraception Counseling Provided No             Assessment:     1. Pelvic pain Will get GYN [place] in about 3-4 weeks  2. Hemorrhoids, unspecified hemorrhoid type Try [preparation H   3. IUD (intrauterine device) in place      Plan:     Will get GYN [place] in about 3-4 weeks and talk when results back

## 2020-02-05 ENCOUNTER — Inpatient Hospital Stay (HOSPITAL_COMMUNITY): Payer: Medicaid Other | Attending: Hematology

## 2020-02-05 ENCOUNTER — Other Ambulatory Visit: Payer: Self-pay

## 2020-02-05 DIAGNOSIS — R590 Localized enlarged lymph nodes: Secondary | ICD-10-CM | POA: Insufficient documentation

## 2020-02-05 DIAGNOSIS — D72829 Elevated white blood cell count, unspecified: Secondary | ICD-10-CM | POA: Diagnosis not present

## 2020-02-05 DIAGNOSIS — F17210 Nicotine dependence, cigarettes, uncomplicated: Secondary | ICD-10-CM | POA: Diagnosis not present

## 2020-02-05 DIAGNOSIS — Z79899 Other long term (current) drug therapy: Secondary | ICD-10-CM | POA: Diagnosis not present

## 2020-02-05 LAB — CBC WITH DIFFERENTIAL/PLATELET
Abs Immature Granulocytes: 0.03 10*3/uL (ref 0.00–0.07)
Basophils Absolute: 0.1 10*3/uL (ref 0.0–0.1)
Basophils Relative: 1 %
Eosinophils Absolute: 0.2 10*3/uL (ref 0.0–0.5)
Eosinophils Relative: 2 %
HCT: 39.6 % (ref 36.0–46.0)
Hemoglobin: 13.1 g/dL (ref 12.0–15.0)
Immature Granulocytes: 0 %
Lymphocytes Relative: 30 %
Lymphs Abs: 2.8 10*3/uL (ref 0.7–4.0)
MCH: 32 pg (ref 26.0–34.0)
MCHC: 33.1 g/dL (ref 30.0–36.0)
MCV: 96.6 fL (ref 80.0–100.0)
Monocytes Absolute: 0.6 10*3/uL (ref 0.1–1.0)
Monocytes Relative: 6 %
Neutro Abs: 5.6 10*3/uL (ref 1.7–7.7)
Neutrophils Relative %: 61 %
Platelets: 218 10*3/uL (ref 150–400)
RBC: 4.1 MIL/uL (ref 3.87–5.11)
RDW: 12.8 % (ref 11.5–15.5)
WBC: 9.3 10*3/uL (ref 4.0–10.5)
nRBC: 0 % (ref 0.0–0.2)

## 2020-02-05 LAB — LACTATE DEHYDROGENASE: LDH: 148 U/L (ref 98–192)

## 2020-02-06 ENCOUNTER — Inpatient Hospital Stay (HOSPITAL_BASED_OUTPATIENT_CLINIC_OR_DEPARTMENT_OTHER): Payer: Medicaid Other | Admitting: Nurse Practitioner

## 2020-02-06 ENCOUNTER — Other Ambulatory Visit: Payer: Self-pay

## 2020-02-06 DIAGNOSIS — D72829 Elevated white blood cell count, unspecified: Secondary | ICD-10-CM

## 2020-02-06 NOTE — Assessment & Plan Note (Addendum)
1.  Leukocytosis: -CBC on 09/13/2019 showed white count of 13.8 with a normal hemoglobin and platelet count.  Absolute neutrophil count and absolute lymphocyte count were elevated. -Had a history of leukocytosis since May 2020 with white count ranging between 11.2 and 17.1.  It was mostly neutrophilic leukocytosis although on one occasion there was elevated lymphocyte count also. -MRI of the lumbar spine without contrast on 08/27/2019 was suggested of sacroiliitis.  No incidental lymphadenopathy. -She is not on any systemic steroids.  Denies a history of connective tissue disorder. -She smokes half a pack to 1 pack/day for 13 years.  Reported night sweats on and off in the past 2 months, around 2 AM, nondrenching type. -She reportedly had lymph node swelling in the right axillary last week.  Today's physical examination did not reveal any palpable adenopathy. -Work-up was completed on 09/20/2019 which showed negative for connective tissue disorder.  Negative for BCR/ABL.  Negative for Jak 2 V6 70F. -She was positive for CALR mutation. -If stable test are negative, likely leukocytosis is smoking. -Labs done on 02/05/2020 showed WBC 9.3 -We will see her back in 6 months with repeat Labs.  2.  Family history: -Maternal grandmother had ITP and died of acute leukemia. -Maternal uncle had ITP. -Mother had floor of the mouth cancer.  3.  Right iliac bone edema: -MRI of the lumbar spine without contrast on 08/27/2019 showed predominant edema signal within the right iliac bone, abutting the right sacroiliac joint and extending more laterally measuring 5 x 1.2 cm -She had a follow-up MRI on 09/23/2019 which showed nondisplaced right iliac bone fracture with surrounding marrow edema.  Possible very small nondisplaced fracture involving the left sacrum anteriorly.  No hip fracture or AVN. -She follows up with her orthopedic doctor.
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
## 2020-02-13 DIAGNOSIS — Z79891 Long term (current) use of opiate analgesic: Secondary | ICD-10-CM | POA: Diagnosis not present

## 2020-02-13 DIAGNOSIS — J302 Other seasonal allergic rhinitis: Secondary | ICD-10-CM | POA: Diagnosis not present

## 2020-02-13 DIAGNOSIS — M79672 Pain in left foot: Secondary | ICD-10-CM | POA: Diagnosis not present

## 2020-02-13 DIAGNOSIS — M79604 Pain in right leg: Secondary | ICD-10-CM | POA: Diagnosis not present

## 2020-02-13 DIAGNOSIS — F341 Dysthymic disorder: Secondary | ICD-10-CM | POA: Diagnosis not present

## 2020-02-13 DIAGNOSIS — M5416 Radiculopathy, lumbar region: Secondary | ICD-10-CM | POA: Diagnosis not present

## 2020-02-13 DIAGNOSIS — M545 Low back pain, unspecified: Secondary | ICD-10-CM | POA: Diagnosis not present

## 2020-02-13 DIAGNOSIS — G4700 Insomnia, unspecified: Secondary | ICD-10-CM | POA: Diagnosis not present

## 2020-02-13 DIAGNOSIS — K219 Gastro-esophageal reflux disease without esophagitis: Secondary | ICD-10-CM | POA: Diagnosis not present

## 2020-02-13 DIAGNOSIS — M461 Sacroiliitis, not elsewhere classified: Secondary | ICD-10-CM | POA: Diagnosis not present

## 2020-02-20 ENCOUNTER — Ambulatory Visit (INDEPENDENT_AMBULATORY_CARE_PROVIDER_SITE_OTHER): Payer: Medicaid Other | Admitting: Orthopaedic Surgery

## 2020-02-20 ENCOUNTER — Ambulatory Visit: Payer: Medicaid Other

## 2020-02-20 ENCOUNTER — Other Ambulatory Visit: Payer: Self-pay

## 2020-02-20 ENCOUNTER — Encounter: Payer: Self-pay | Admitting: Orthopaedic Surgery

## 2020-02-20 VITALS — BP 147/81 | HR 91 | Ht 64.0 in | Wt 265.0 lb

## 2020-02-20 DIAGNOSIS — M25571 Pain in right ankle and joints of right foot: Secondary | ICD-10-CM | POA: Diagnosis not present

## 2020-02-20 DIAGNOSIS — Z6841 Body Mass Index (BMI) 40.0 and over, adult: Secondary | ICD-10-CM | POA: Diagnosis not present
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
## 2020-02-27 ENCOUNTER — Other Ambulatory Visit: Payer: Medicaid Other

## 2020-02-29 ENCOUNTER — Other Ambulatory Visit: Payer: Self-pay | Admitting: Family

## 2020-02-29 DIAGNOSIS — K219 Gastro-esophageal reflux disease without esophagitis: Secondary | ICD-10-CM

## 2020-03-05 ENCOUNTER — Other Ambulatory Visit: Payer: Self-pay

## 2020-03-05 ENCOUNTER — Ambulatory Visit (INDEPENDENT_AMBULATORY_CARE_PROVIDER_SITE_OTHER): Payer: Medicaid Other | Admitting: Orthopaedic Surgery

## 2020-03-05 ENCOUNTER — Encounter: Payer: Self-pay | Admitting: Orthopaedic Surgery

## 2020-03-05 VITALS — BP 135/86 | HR 80 | Ht 64.0 in | Wt 251.0 lb

## 2020-03-05 DIAGNOSIS — Z6841 Body Mass Index (BMI) 40.0 and over, adult: Secondary | ICD-10-CM

## 2020-03-05 DIAGNOSIS — M25571 Pain in right ankle and joints of right foot: Secondary | ICD-10-CM
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
## 2020-03-06 ENCOUNTER — Ambulatory Visit (INDEPENDENT_AMBULATORY_CARE_PROVIDER_SITE_OTHER): Payer: Medicaid Other

## 2020-03-06 DIAGNOSIS — R102 Pelvic and perineal pain: Secondary | ICD-10-CM | POA: Diagnosis not present

## 2020-03-06 NOTE — Progress Notes (Signed)
PELVIC US TA/TV: homogeneous anteverted uterus,wnl,EEC 3.2 mm,IUD is centrally located with in the endometrium,normal left ovary,hemorrhagic right ovarian cyst 3.1 x 2.8 x 2.6 cm,ovaries appear mobile,no free fluid,no pain during ultrasound

## 2020-03-08 ENCOUNTER — Telehealth: Payer: Self-pay | Admitting: Adult Health

## 2020-03-08 NOTE — Telephone Encounter (Signed)
Pt aware that US showed normal uterus, IUD in place and normal left ovary and has cyst on right ovary, once Dr Elonda Husky reads this will drop to Mychart

## 2020-03-12 DIAGNOSIS — M545 Low back pain, unspecified: Secondary | ICD-10-CM | POA: Diagnosis not present

## 2020-03-12 DIAGNOSIS — M79672 Pain in left foot: Secondary | ICD-10-CM | POA: Diagnosis not present

## 2020-03-12 DIAGNOSIS — M79604 Pain in right leg: Secondary | ICD-10-CM | POA: Diagnosis not present

## 2020-03-12 DIAGNOSIS — M5416 Radiculopathy, lumbar region: Secondary | ICD-10-CM | POA: Diagnosis not present

## 2020-03-12 DIAGNOSIS — M461 Sacroiliitis, not elsewhere classified: Secondary | ICD-10-CM | POA: Diagnosis not present

## 2020-03-12 DIAGNOSIS — Z79891 Long term (current) use of opiate analgesic: Secondary | ICD-10-CM | POA: Diagnosis not present

## 2020-03-12 DIAGNOSIS — K219 Gastro-esophageal reflux disease without esophagitis: Secondary | ICD-10-CM | POA: Diagnosis not present

## 2020-03-12 DIAGNOSIS — G4700 Insomnia, unspecified: Secondary | ICD-10-CM | POA: Diagnosis not present

## 2020-03-12 DIAGNOSIS — F341 Dysthymic disorder: Secondary | ICD-10-CM | POA: Diagnosis not present

## 2020-03-12 DIAGNOSIS — J302 Other seasonal allergic rhinitis: Secondary | ICD-10-CM | POA: Diagnosis not present

## 2020-03-18 ENCOUNTER — Encounter: Payer: Self-pay | Admitting: Orthopedic Surgery

## 2020-03-18 ENCOUNTER — Ambulatory Visit (INDEPENDENT_AMBULATORY_CARE_PROVIDER_SITE_OTHER): Payer: Medicaid Other

## 2020-03-18 ENCOUNTER — Ambulatory Visit (INDEPENDENT_AMBULATORY_CARE_PROVIDER_SITE_OTHER): Payer: Medicaid Other | Admitting: Orthopedic Surgery

## 2020-03-18 VITALS — Ht 64.0 in | Wt 251.0 lb

## 2020-03-18 DIAGNOSIS — M25572 Pain in left ankle and joints of left foot: Secondary | ICD-10-CM

## 2020-03-18 DIAGNOSIS — M79672 Pain in left foot: Secondary | ICD-10-CM | POA: Diagnosis not present

## 2020-03-18 MED ORDER — METHYLPREDNISOLONE ACETATE 40 MG/ML IJ SUSP
40.0000 mg | INTRAMUSCULAR | Status: AC | PRN
  Administered 2020-03-18: 40 mg via INTRA_ARTICULAR

## 2020-03-18 MED ORDER — LIDOCAINE HCL 1 % IJ SOLN
2.0000 mL | INTRAMUSCULAR | Status: AC | PRN
  Administered 2020-03-18: 2 mL
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
## 2020-03-19 ENCOUNTER — Ambulatory Visit: Payer: Medicaid Other | Admitting: Orthopaedic Surgery

## 2020-03-26 ENCOUNTER — Other Ambulatory Visit: Payer: Self-pay | Admitting: Family

## 2020-03-26 ENCOUNTER — Encounter: Payer: Self-pay | Admitting: Orthopaedic Surgery

## 2020-03-26 ENCOUNTER — Ambulatory Visit (INDEPENDENT_AMBULATORY_CARE_PROVIDER_SITE_OTHER): Payer: Medicaid Other | Admitting: Orthopaedic Surgery

## 2020-03-26 ENCOUNTER — Other Ambulatory Visit: Payer: Self-pay

## 2020-03-26 VITALS — Ht 64.0 in | Wt 251.0 lb

## 2020-03-26 DIAGNOSIS — Z6841 Body Mass Index (BMI) 40.0 and over, adult: Secondary | ICD-10-CM

## 2020-03-26 DIAGNOSIS — M25571 Pain in right ankle and joints of right foot: Secondary | ICD-10-CM

## 2020-03-26 DIAGNOSIS — K219 Gastro-esophageal reflux disease without esophagitis: Secondary | ICD-10-CM
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
## 2020-04-05 ENCOUNTER — Ambulatory Visit (INDEPENDENT_AMBULATORY_CARE_PROVIDER_SITE_OTHER): Payer: Medicaid Other

## 2020-04-05 ENCOUNTER — Ambulatory Visit
Admission: EM | Admit: 2020-04-05 | Discharge: 2020-04-05 | Disposition: A | Discharge status: Home / Self Care | Payer: Medicaid Other | Attending: Family Medicine | Admitting: Family Medicine

## 2020-04-05 ENCOUNTER — Encounter: Payer: Self-pay | Admitting: Emergency Medicine

## 2020-04-05 ENCOUNTER — Other Ambulatory Visit: Payer: Self-pay

## 2020-04-05 DIAGNOSIS — R110 Nausea: Secondary | ICD-10-CM

## 2020-04-05 DIAGNOSIS — R519 Headache, unspecified: Secondary | ICD-10-CM | POA: Diagnosis not present

## 2020-04-05 DIAGNOSIS — R0602 Shortness of breath: Secondary | ICD-10-CM

## 2020-04-05 DIAGNOSIS — Z20828 Contact with and (suspected) exposure to other viral communicable diseases: Secondary | ICD-10-CM | POA: Diagnosis not present

## 2020-04-05 DIAGNOSIS — R509 Fever, unspecified: Secondary | ICD-10-CM | POA: Diagnosis not present

## 2020-04-05 DIAGNOSIS — R52 Pain, unspecified: Secondary | ICD-10-CM

## 2020-04-05 DIAGNOSIS — R059 Cough, unspecified: Secondary | ICD-10-CM | POA: Diagnosis not present

## 2020-04-05 DIAGNOSIS — B349 Viral infection, unspecified: Secondary | ICD-10-CM | POA: Diagnosis not present

## 2020-04-05 DIAGNOSIS — R5383 Other fatigue: Secondary | ICD-10-CM | POA: Diagnosis not present

## 2020-04-05 DIAGNOSIS — Z20822 Contact with and (suspected) exposure to covid-19: Secondary | ICD-10-CM | POA: Diagnosis not present

## 2020-04-05 DIAGNOSIS — R6883 Chills (without fever): Secondary | ICD-10-CM | POA: Diagnosis not present

## 2020-04-05 MED ORDER — DEXAMETHASONE SODIUM PHOSPHATE 10 MG/ML IJ SOLN
10.0000 mg | Freq: Once | INTRAMUSCULAR | Status: AC
  Administered 2020-04-05: 10 mg via INTRAMUSCULAR

## 2020-04-05 MED ORDER — PREDNISONE 10 MG (21) PO TBPK: ORAL_TABLET | Freq: Every day | ORAL | 0 refills | Status: AC

## 2020-04-05 MED ORDER — BENZONATATE 100 MG PO CAPS: 100.0000 mg | ORAL_CAPSULE | Freq: Three times a day (TID) | ORAL | 0 refills | Status: DC
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
## 2020-04-05 NOTE — ED Triage Notes (Signed)
Body aches, fatigue, nausea that started today.  Was exposed to covid positive person recently.

## 2020-04-05 NOTE — Discharge Instructions (Addendum)
I will call you with chest xray results  You have received a steroid injection in the office today  I have sent in a prednisone taper for you to take for 6 days. 6 tablets on day one, 5 tablets on day two, 4 tablets on day three, 3 tablets on day four, 2 tablets on day five, and 1 tablet on day six.  I have sent in [place] for you to use one capsule every 8 hours as needed for cough.  Your COVID and Flu tests are pending.  You should self quarantine until the test results are back.    Take Tylenol or ibuprofen as needed for fever or discomfort.  Rest and keep yourself hydrated.    Follow-up with your primary care provider if your symptoms are not improving.

## 2020-04-07 LAB — COVID-19, FLU A+B AND RSV
Influenza A, NAA: NOT DETECTED
Influenza B, NAA: NOT DETECTED
RSV, NAA: NOT DETECTED
SARS-CoV-2, NAA: DETECTED — AB

## 2020-04-08 ENCOUNTER — Ambulatory Visit: Payer: Medicaid Other | Admitting: Orthopedic Surgery

## 2020-04-09 ENCOUNTER — Other Ambulatory Visit: Payer: Self-pay | Admitting: Adult Health

## 2020-04-09 ENCOUNTER — Ambulatory Visit (HOSPITAL_COMMUNITY)
Admission: RE | Admit: 2020-04-09 | Discharge: 2020-04-09 | Disposition: A | Discharge status: Home / Self Care | Payer: Medicaid Other | Source: Ambulatory Visit | Attending: Pulmonary Disease | Admitting: Pulmonary Disease

## 2020-04-09 ENCOUNTER — Telehealth (HOSPITAL_COMMUNITY): Payer: Self-pay | Admitting: Emergency Medicine

## 2020-04-09 DIAGNOSIS — U071 COVID-19: Secondary | ICD-10-CM | POA: Insufficient documentation

## 2020-04-09 MED ORDER — ALBUTEROL SULFATE HFA 108 (90 BASE) MCG/ACT IN AERS: 2.0000 | INHALATION_SPRAY | Freq: Once | RESPIRATORY_TRACT | Status: DC | PRN

## 2020-04-09 MED ORDER — METHYLPREDNISOLONE SODIUM SUCC 125 MG IJ SOLR: 125.0000 mg | Freq: Once | INTRAMUSCULAR | Status: DC | PRN

## 2020-04-09 MED ORDER — SODIUM CHLORIDE 0.9 % IV SOLN: INTRAVENOUS | Status: DC | PRN

## 2020-04-09 MED ORDER — EPINEPHRINE 0.3 MG/0.3ML IJ SOAJ: 0.3000 mg | Freq: Once | INTRAMUSCULAR | Status: DC | PRN

## 2020-04-09 MED ORDER — FAMOTIDINE IN NACL 20-0.9 MG/50ML-% IV SOLN: 20.0000 mg | Freq: Once | INTRAVENOUS | Status: DC | PRN

## 2020-04-09 MED ORDER — DIPHENHYDRAMINE HCL 50 MG/ML IJ SOLN: 50.0000 mg | Freq: Once | INTRAMUSCULAR | Status: DC | PRN

## 2020-04-09 MED ORDER — SOTROVIMAB 500 MG/8ML IV SOLN
500.0000 mg | Freq: Once | INTRAVENOUS | Status: AC
  Administered 2020-04-09: 500 mg via INTRAVENOUS

## 2020-04-09 NOTE — Progress Notes (Signed)
Diagnosis: COVID-19  Physician: Dr. Patrick Wright  Procedure: Covid Infusion Clinic Med: Sotrovimab infusion - Provided patient with sotrovimab fact sheet for patients, parents, and caregivers prior to infusion.   Complications: No immediate complications noted  Discharge: Discharged home    

## 2020-04-09 NOTE — Progress Notes (Signed)
Patient reviewed Fact Sheet for Patients, Parents, and Caregivers for Emergency Use Authorization (EUA) of Sotrovimab for the Treatment of Coronavirus. Patient also reviewed and is agreeable to the estimated cost of treatment. Patient is agreeable to proceed.   

## 2020-04-09 NOTE — Discharge Instructions (Signed)
10 Things You Can Do to Manage Your COVID-19 Symptoms at Home If you have possible or confirmed COVID-19: 1. Stay home from work and school. And stay away from other public places. If you must go out, avoid using any kind of public transportation, ridesharing, or taxis. 2. Monitor your symptoms carefully. If your symptoms get worse, call your healthcare provider immediately. 3. Get rest and stay hydrated. 4. If you have a medical appointment, call the healthcare provider ahead of time and tell them that you have or may have COVID-19. 5. For medical emergencies, call 911 and notify the dispatch personnel that you have or may have COVID-19. 6. Cover your cough and sneezes with a tissue or use the inside of your elbow. 7. Wash your hands often with soap and water for at least 20 seconds or clean your hands with an alcohol-based hand sanitizer that contains at least 60% alcohol. 8. As much as possible, stay in a specific room and away from other people in your home. Also, you should use a separate bathroom, if available. If you need to be around other people in or outside of the home, wear a mask. 9. Avoid sharing personal items with other people in your household, like dishes, towels, and bedding. 10. Clean all surfaces that are touched often, like counters, tabletops, and doorknobs. Use household cleaning sprays or wipes according to the label instructions. cdc.gov/coronavirus 11/09/2018 This information is not intended to replace advice given to you by your health care provider. Make sure you discuss any questions you have with your health care provider. Document Revised: 04/13/2019 Document Reviewed: 04/13/2019 Elsevier Patient Education  2020 Elsevier Inc.  What types of side effects do monoclonal antibody drugs cause?  Common side effects  In general, the more common side effects caused by monoclonal antibody drugs include: . Allergic reactions, such as hives or itching . Flu-like signs and  symptoms, including chills, fatigue, fever, and muscle aches and pains . Nausea, vomiting . Diarrhea . Skin rashes . Low blood pressure   The CDC is recommending patients who receive monoclonal antibody treatments wait at least 90 days before being vaccinated.  Currently, there are no data on the safety and efficacy of mRNA COVID-19 vaccines in persons who received monoclonal antibodies or convalescent plasma as part of COVID-19 treatment. Based on the estimated half-life of such therapies as well as evidence suggesting that reinfection is uncommon in the 90 days after initial infection, vaccination should be deferred for at least 90 days, as a precautionary measure until additional information becomes available, to avoid interference of the antibody treatment with vaccine-induced immune responses.  If you have any questions or concerns after the infusion please call the Advanced Practice Provider on call at [phone]. This number is only intended for your use regarding questions or concerns about the infusion post-treatment side-effects.  Please do not provide this number to others for use.   If someone you know is interested in receiving treatment please have them call the COVID hotline at [phone].   

## 2020-04-09 NOTE — Progress Notes (Signed)
I connected by phone with Taylor [name] on 04/09/2020 at 11:37 AM to discuss the potential use of a new treatment for mild to moderate COVID-19 viral infection in non-hospitalized patients.  This patient is a 29 y.o. female that meets the FDA criteria for Emergency Use Authorization of COVID monoclonal antibody casirivimab/imdevimab, bamlanivimab/eteseviamb, or sotrovimab.  Has a (+) direct SARS-CoV-2 viral test result  Has mild or moderate COVID-19   Is NOT hospitalized due to COVID-19  Is within 10 days of symptom onset  Has at least one of the high risk factor(s) for progression to severe COVID-19 and/or hospitalization as defined in EUA.  Specific high risk criteria : BMI > 25, Cardiovascular disease or hypertension, Chronic Lung Disease and Other high risk medical condition per CDC:  current smoker, aortic malformation   I have spoken and communicated the following to the patient or parent/caregiver regarding COVID monoclonal antibody treatment:  1. FDA has authorized the emergency use for the treatment of mild to moderate COVID-19 in adults and pediatric patients with positive results of direct SARS-CoV-2 viral testing who are 14 years of age and older weighing at least 40 kg, and who are at high risk for progressing to severe COVID-19 and/or hospitalization.  2. The significant known and potential risks and benefits of COVID monoclonal antibody, and the extent to which such potential risks and benefits are unknown.  3. Information on available alternative treatments and the risks and benefits of those alternatives, including clinical trials.  4. Patients treated with COVID monoclonal antibody should continue to self-isolate and use infection control measures (e.g., wear mask, isolate, social distance, avoid sharing personal items, clean and disinfect "high touch" surfaces, and frequent handwashing) according to CDC guidelines.   5. The patient or parent/caregiver has the option  to accept or refuse COVID monoclonal antibody treatment.  After reviewing this information with the patient, the patient has agreed to receive one of the available covid 19 monoclonal antibodies and will be provided an appropriate fact sheet prior to infusion. Scot Dock, NP 04/09/2020 11:37 AM

## 2020-04-09 NOTE — Telephone Encounter (Signed)
Called pt and explained possible monoclonal antibody treatment. Sx started 11/25. Tested positive 11/26 at Urgent Care in [place] on [address]. Sx include SOB, body aches, loss of taste and smell, fatigue, cough, and weakness. Qualifying risk factors include asthma, bicuspid aortic valve malformation, smoker, and BMI 43.1.Pt is not vaccinated. Pt interested in tx. Informed pt an APP will call back to possibly schedule an appointment.

## 2020-04-16 ENCOUNTER — Ambulatory Visit: Payer: Medicaid Other | Admitting: Orthopaedic Surgery

## 2020-04-16 ENCOUNTER — Ambulatory Visit (HOSPITAL_COMMUNITY): Payer: Medicaid Other

## 2020-04-18 ENCOUNTER — Ambulatory Visit: Payer: Medicaid Other | Admitting: Orthopaedic Surgery

## 2020-04-23 DIAGNOSIS — M461 Sacroiliitis, not elsewhere classified: Secondary | ICD-10-CM | POA: Diagnosis not present

## 2020-04-23 DIAGNOSIS — G4700 Insomnia, unspecified: Secondary | ICD-10-CM | POA: Diagnosis not present

## 2020-04-23 DIAGNOSIS — M79672 Pain in left foot: Secondary | ICD-10-CM | POA: Diagnosis not present

## 2020-04-23 DIAGNOSIS — M545 Low back pain, unspecified: Secondary | ICD-10-CM | POA: Diagnosis not present

## 2020-04-23 DIAGNOSIS — K219 Gastro-esophageal reflux disease without esophagitis: Secondary | ICD-10-CM | POA: Diagnosis not present

## 2020-04-23 DIAGNOSIS — M5416 Radiculopathy, lumbar region: Secondary | ICD-10-CM | POA: Diagnosis not present

## 2020-04-23 DIAGNOSIS — Z79891 Long term (current) use of opiate analgesic: Secondary | ICD-10-CM | POA: Diagnosis not present

## 2020-04-23 DIAGNOSIS — F341 Dysthymic disorder: Secondary | ICD-10-CM | POA: Diagnosis not present

## 2020-04-23 DIAGNOSIS — M79604 Pain in right leg: Secondary | ICD-10-CM | POA: Diagnosis not present

## 2020-04-23 DIAGNOSIS — J302 Other seasonal allergic rhinitis: Secondary | ICD-10-CM | POA: Diagnosis not present

## 2020-04-24 ENCOUNTER — Other Ambulatory Visit: Payer: Self-pay

## 2020-04-24 ENCOUNTER — Other Ambulatory Visit: Payer: Self-pay | Admitting: Family

## 2020-04-24 DIAGNOSIS — K219 Gastro-esophageal reflux disease without esophagitis: Secondary | ICD-10-CM

## 2020-04-24 MED ORDER — OMEPRAZOLE 40 MG PO CPDR: 40.0000 mg | DELAYED_RELEASE_CAPSULE | Freq: Every day | ORAL | 0 refills | Status: DC

## 2020-04-24 NOTE — Telephone Encounter (Signed)
[place]. NTBS 30 days given 03/27/20

## 2020-04-25 ENCOUNTER — Ambulatory Visit (HOSPITAL_COMMUNITY)
Admission: RE | Admit: 2020-04-25 | Discharge: 2020-04-25 | Disposition: A | Discharge status: Home / Self Care | Payer: Medicaid Other | Source: Ambulatory Visit | Attending: Orthopaedic Surgery | Admitting: Orthopaedic Surgery

## 2020-04-25 ENCOUNTER — Other Ambulatory Visit: Payer: Self-pay

## 2020-04-25 DIAGNOSIS — M25571 Pain in right ankle and joints of right foot: Secondary | ICD-10-CM | POA: Diagnosis not present

## 2020-04-25 DIAGNOSIS — R600 Localized edema: Secondary | ICD-10-CM | POA: Diagnosis not present

## 2020-04-25 DIAGNOSIS — M19071 Primary osteoarthritis, right ankle and foot: Secondary | ICD-10-CM | POA: Diagnosis not present

## 2020-04-25 NOTE — Telephone Encounter (Signed)
LMTCB JBB 12/16

## 2020-05-11 DIAGNOSIS — U071 COVID-19: Secondary | ICD-10-CM

## 2020-05-11 HISTORY — DX: COVID-19: U07.1

## 2020-05-14 ENCOUNTER — Encounter: Payer: Self-pay | Admitting: Orthopaedic Surgery

## 2020-05-14 ENCOUNTER — Ambulatory Visit (INDEPENDENT_AMBULATORY_CARE_PROVIDER_SITE_OTHER): Payer: Medicaid Other | Admitting: Orthopaedic Surgery

## 2020-05-14 ENCOUNTER — Other Ambulatory Visit: Payer: Self-pay

## 2020-05-14 VITALS — BP 106/73 | HR 91 | Ht 64.0 in | Wt 251.0 lb

## 2020-05-14 DIAGNOSIS — Z6841 Body Mass Index (BMI) 40.0 and over, adult: Secondary | ICD-10-CM | POA: Diagnosis not present

## 2020-05-14 DIAGNOSIS — M25571 Pain in right ankle and joints of right foot: Secondary | ICD-10-CM
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
## 2020-05-20 ENCOUNTER — Encounter: Payer: Self-pay | Admitting: Orthopedic Surgery

## 2020-05-20 ENCOUNTER — Ambulatory Visit (INDEPENDENT_AMBULATORY_CARE_PROVIDER_SITE_OTHER): Payer: Medicaid Other

## 2020-05-20 ENCOUNTER — Ambulatory Visit (INDEPENDENT_AMBULATORY_CARE_PROVIDER_SITE_OTHER): Payer: Medicaid Other | Admitting: Orthopedic Surgery

## 2020-05-20 DIAGNOSIS — M25571 Pain in right ankle and joints of right foot: Secondary | ICD-10-CM | POA: Diagnosis not present

## 2020-05-20 DIAGNOSIS — M25871 Other specified joint disorders, right ankle and foot: Secondary | ICD-10-CM

## 2020-05-20 DIAGNOSIS — M25572 Pain in left ankle and joints of left foot: Secondary | ICD-10-CM

## 2020-05-20 DIAGNOSIS — M24271 Disorder of ligament, right ankle: Secondary | ICD-10-CM | POA: Diagnosis not present
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
## 2020-05-21 ENCOUNTER — Other Ambulatory Visit: Payer: Self-pay | Admitting: Family

## 2020-05-21 DIAGNOSIS — K219 Gastro-esophageal reflux disease without esophagitis: Secondary | ICD-10-CM

## 2020-05-28 ENCOUNTER — Ambulatory Visit (INDEPENDENT_AMBULATORY_CARE_PROVIDER_SITE_OTHER): Payer: Medicaid Other | Admitting: Family

## 2020-05-28 ENCOUNTER — Encounter: Payer: Self-pay | Admitting: Family

## 2020-05-28 DIAGNOSIS — M5416 Radiculopathy, lumbar region: Secondary | ICD-10-CM

## 2020-05-28 DIAGNOSIS — K219 Gastro-esophageal reflux disease without esophagitis: Secondary | ICD-10-CM

## 2020-05-28 DIAGNOSIS — F411 Generalized anxiety disorder: Secondary | ICD-10-CM | POA: Diagnosis not present

## 2020-05-28 DIAGNOSIS — G5603 Carpal tunnel syndrome, bilateral upper limbs: Secondary | ICD-10-CM

## 2020-05-28 DIAGNOSIS — F321 Major depressive disorder, single episode, moderate: Secondary | ICD-10-CM

## 2020-05-28 DIAGNOSIS — F17200 Nicotine dependence, unspecified, uncomplicated: Secondary | ICD-10-CM

## 2020-05-28 MED ORDER — BUPROPION HCL ER (XL) 150 MG PO TB24: 150.0000 mg | ORAL_TABLET | Freq: Every day | ORAL | 1 refills | Status: DC

## 2020-05-28 MED ORDER — DICLOFENAC SODIUM 75 MG PO TBEC: 75.0000 mg | DELAYED_RELEASE_TABLET | Freq: Two times a day (BID) | ORAL | 2 refills | Status: DC

## 2020-05-28 NOTE — Progress Notes (Signed)
Virtual Visit via telephone Note Due to COVID-19 pandemic this visit was conducted virtually. This visit type was conducted due to national recommendations for restrictions regarding the COVID-19 Pandemic (e.g. social distancing, sheltering in place) in an effort to limit this patient's exposure and mitigate transmission in our community. All issues noted in this document were discussed and addressed.  A physical exam was not performed with this format.  I connected with Taylor [name] on 05/28/20 at 11:07 AM  by telephone and verified that I am speaking with the correct person using two identifiers. Taylor [name] is currently located at home and no one is currently with her during visit. The provider, Evelina Dun, FNP is located in their office at time of visit.  I discussed the limitations, risks, security and privacy concerns of performing an evaluation and management service by telephone and the availability of in person appointments. I also discussed with the patient that there may be a patient responsible charge related to this service. The patient expressed understanding and agreed to proceed.   History and Present Illness:  PT calls the office today for chronic follow up. She is followed by Pain Management every 6-8 weeks for chronic back pain. She is followed by Ortho left foot fracture.     She is complaining of bilateral wrist pain that started a year ago. She states she wakes up at night with bilateral numbness and pain that radiates to her elbow. Back Pain This is a chronic problem. The current episode started more than 1 year ago. The problem occurs intermittently. The problem has been waxing and waning since onset. The pain is present in the lumbar spine. The quality of the pain is described as aching. The pain is at a severity of 0/10 (as long as she is taking her pain medications). The pain is moderate. The treatment provided mild relief.  Gastroesophageal Reflux She  complains of belching and heartburn. This is a chronic problem. The current episode started in the past 7 days. The problem occurs occasionally. Risk factors include obesity. She has tried a PPI for the symptoms. The treatment provided moderate relief.  Anxiety Presents for follow-up visit. Symptoms include depressed mood, insomnia, irritability, nervous/anxious behavior and restlessness. Symptoms occur occasionally. The severity of symptoms is moderate.    Depression        This is a chronic problem.  The current episode started more than 1 year ago.   The onset quality is gradual.   The problem occurs intermittently.  Associated symptoms include insomnia, irritable, restlessness and sad.  Associated symptoms include no helplessness and no hopelessness.  Past treatments include SNRIs - Serotonin and norepinephrine reuptake inhibitors.  Past medical history includes anxiety.   Diarrhea  This is a chronic problem. The current episode started more than 1 year ago. The problem occurs less than 2 times per day. The stool consistency is described as watery. Nothing aggravates the symptoms. She has tried increased fluids and change of diet for the symptoms. The treatment provided mild relief.      Review of Systems  Constitutional: Positive for irritability.  Gastrointestinal: Positive for diarrhea and heartburn.  Musculoskeletal: Positive for back pain.  Psychiatric/Behavioral: Positive for depression. The patient is nervous/anxious and has insomnia.   All other systems reviewed and are negative.    Observations/Objective: No SOB or distress noted   Assessment and Plan: [name] comes in today with chief complaint of No chief complaint on file.  Diagnosis and orders addressed:  1. Gastroesophageal reflux disease, unspecified whether esophagitis present - CMP14+EGFR; Future - CBC with Differential/Platelet; Future  2. Lumbar radiculopathy - CMP14+EGFR; Future - CBC with  Differential/Platelet; Future  3. Morbid obesity ([place]) - CMP14+EGFR; Future - CBC with Differential/Platelet; Future  4. GAD (generalized anxiety disorder) Will add Wellbutrin 150 mg today Continue Cymbalta Stress management  RTO in 6 weeks  - buPROPion (WELLBUTRIN XL) 150 MG 24 hr tablet; Take 1 tablet (150 mg total) by mouth daily.  Dispense: 90 tablet; Refill: 1 - CMP14+EGFR; Future - CBC with Differential/Platelet; Future - TSH; Future  5. Depression, major, single episode, moderate ([facility]) - buPROPion (WELLBUTRIN XL) 150 MG 24 hr tablet; Take 1 tablet (150 mg total) by mouth daily.  Dispense: 90 tablet; Refill: 1 - CMP14+EGFR; Future - CBC with Differential/Platelet; Future - TSH; Future  6. Current smoker - CMP14+EGFR; Future - CBC with Differential/Platelet; Future  7. Bilateral carpal tunnel syndrome -Wear bilateral wrist splints -Start diclofenac 75 mg BID  No other NSAID's  If no improvement will need referral to hand specialists  - diclofenac (VOLTAREN) 75 MG EC tablet; Take 1 tablet (75 mg total) by mouth 2 (two) times daily.  Dispense: 60 tablet; Refill: 2 - CMP14+EGFR; Future - CBC with Differential/Platelet; Future   Labs pending Health Maintenance reviewed Diet and exercise encouraged  Follow up plan: 6 weeks to recheck GAD and Depression        I discussed the assessment and treatment plan with the patient. The patient was provided an opportunity to ask questions and all were answered. The patient agreed with the plan and demonstrated an understanding of the instructions.   The patient was advised to call back or seek an in-person evaluation if the symptoms worsen or if the condition fails to improve as anticipated.  The above assessment and management plan was discussed with the patient. The patient verbalized understanding of and has agreed to the management plan. Patient is aware to call the clinic if symptoms persist or worsen. Patient is aware  when to return to the clinic for a follow-up visit. Patient educated on when it is appropriate to go to the emergency department.   Time call ended:  11:28 AM   I provided 21 minutes of non-face-to-face time during this encounter.    Evelina Dun, FNP

## 2020-06-12 DIAGNOSIS — Z113 Encounter for screening for infections with a predominantly sexual mode of transmission: Secondary | ICD-10-CM | POA: Diagnosis not present

## 2020-06-12 DIAGNOSIS — Z114 Encounter for screening for human immunodeficiency virus [HIV]: Secondary | ICD-10-CM | POA: Diagnosis not present

## 2020-06-12 DIAGNOSIS — Z720 Tobacco use: Secondary | ICD-10-CM | POA: Diagnosis not present

## 2020-06-12 DIAGNOSIS — Z01419 Encounter for gynecological examination (general) (routine) without abnormal findings: Secondary | ICD-10-CM | POA: Diagnosis not present

## 2020-06-12 DIAGNOSIS — Z30431 Encounter for routine checking of intrauterine contraceptive device: Secondary | ICD-10-CM | POA: Diagnosis not present

## 2020-06-13 ENCOUNTER — Other Ambulatory Visit: Payer: Medicaid Other

## 2020-06-13 ENCOUNTER — Other Ambulatory Visit: Payer: Self-pay

## 2020-06-13 DIAGNOSIS — K219 Gastro-esophageal reflux disease without esophagitis: Secondary | ICD-10-CM

## 2020-06-13 DIAGNOSIS — M5416 Radiculopathy, lumbar region: Secondary | ICD-10-CM

## 2020-06-13 DIAGNOSIS — F321 Major depressive disorder, single episode, moderate: Secondary | ICD-10-CM

## 2020-06-13 DIAGNOSIS — G5603 Carpal tunnel syndrome, bilateral upper limbs: Secondary | ICD-10-CM

## 2020-06-13 DIAGNOSIS — F411 Generalized anxiety disorder: Secondary | ICD-10-CM

## 2020-06-13 DIAGNOSIS — F17200 Nicotine dependence, unspecified, uncomplicated: Secondary | ICD-10-CM

## 2020-06-14 LAB — CMP14+EGFR
ALT: 58 IU/L — ABNORMAL HIGH (ref 0–32)
AST: 37 IU/L (ref 0–40)
Albumin/Globulin Ratio: 2.1 (ref 1.2–2.2)
Albumin: 4.8 g/dL (ref 3.8–4.8)
Alkaline Phosphatase: 71 IU/L (ref 44–121)
BUN/Creatinine Ratio: 10 (ref 9–23)
BUN: 6 mg/dL (ref 6–20)
Bilirubin Total: <0.2 mg/dL (ref 0.0–1.2)
CO2: 25 mmol/L (ref 20–29)
Calcium: 9.6 mg/dL (ref 8.7–10.2)
Chloride: 100 mmol/L (ref 96–106)
Creatinine, Ser: 0.63 mg/dL (ref 0.57–1.00)
GFR calc Af Amer: 138 mL/min/{1.73_m2} (ref >59)
GFR calc non Af Amer: 120 mL/min/{1.73_m2} (ref >59)
Globulin, Total: 2.3 g/dL (ref 1.5–4.5)
Glucose: 79 mg/dL (ref 65–99)
Potassium: 4.1 mmol/L (ref 3.5–5.2)
Sodium: 139 mmol/L (ref 134–144)
Total Protein: 7.1 g/dL (ref 6.0–8.5)

## 2020-06-14 LAB — CBC WITH DIFFERENTIAL/PLATELET
Basophils Absolute: 0.1 10*3/uL (ref 0.0–0.2)
Basos: 1 %
EOS (ABSOLUTE): 0.4 10*3/uL (ref 0.0–0.4)
Eos: 4 %
Hematocrit: 43.7 % (ref 34.0–46.6)
Hemoglobin: 14.5 g/dL (ref 11.1–15.9)
Immature Grans (Abs): 0 10*3/uL (ref 0.0–0.1)
Immature Granulocytes: 0 %
Lymphocytes Absolute: 2.5 10*3/uL (ref 0.7–3.1)
Lymphs: 25 %
MCH: 30.7 pg (ref 26.6–33.0)
MCHC: 33.2 g/dL (ref 31.5–35.7)
MCV: 93 fL (ref 79–97)
Monocytes Absolute: 0.6 10*3/uL (ref 0.1–0.9)
Monocytes: 6 %
Neutrophils Absolute: 6.3 10*3/uL (ref 1.4–7.0)
Neutrophils: 64 %
Platelets: 222 10*3/uL (ref 150–450)
RBC: 4.72 x10E6/uL (ref 3.77–5.28)
RDW: 11.8 % (ref 11.7–15.4)
WBC: 9.8 10*3/uL (ref 3.4–10.8)

## 2020-06-14 LAB — TSH: TSH: 1.92 u[IU]/mL (ref 0.450–4.500)

## 2020-06-18 DIAGNOSIS — M461 Sacroiliitis, not elsewhere classified: Secondary | ICD-10-CM | POA: Diagnosis not present

## 2020-06-18 DIAGNOSIS — G5603 Carpal tunnel syndrome, bilateral upper limbs: Secondary | ICD-10-CM | POA: Diagnosis not present

## 2020-06-18 DIAGNOSIS — J302 Other seasonal allergic rhinitis: Secondary | ICD-10-CM | POA: Diagnosis not present

## 2020-06-18 DIAGNOSIS — K219 Gastro-esophageal reflux disease without esophagitis: Secondary | ICD-10-CM | POA: Diagnosis not present

## 2020-06-18 DIAGNOSIS — M79604 Pain in right leg: Secondary | ICD-10-CM | POA: Diagnosis not present

## 2020-06-18 DIAGNOSIS — M5416 Radiculopathy, lumbar region: Secondary | ICD-10-CM | POA: Diagnosis not present

## 2020-06-18 DIAGNOSIS — F341 Dysthymic disorder: Secondary | ICD-10-CM | POA: Diagnosis not present

## 2020-06-18 DIAGNOSIS — Z79891 Long term (current) use of opiate analgesic: Secondary | ICD-10-CM | POA: Diagnosis not present

## 2020-06-18 DIAGNOSIS — M79672 Pain in left foot: Secondary | ICD-10-CM | POA: Diagnosis not present

## 2020-06-18 DIAGNOSIS — G4733 Obstructive sleep apnea (adult) (pediatric): Secondary | ICD-10-CM | POA: Diagnosis not present

## 2020-06-18 DIAGNOSIS — M545 Low back pain, unspecified: Secondary | ICD-10-CM | POA: Diagnosis not present

## 2020-06-18 DIAGNOSIS — G4700 Insomnia, unspecified: Secondary | ICD-10-CM | POA: Diagnosis not present

## 2020-06-19 ENCOUNTER — Other Ambulatory Visit: Payer: Self-pay | Admitting: Family

## 2020-06-19 DIAGNOSIS — K219 Gastro-esophageal reflux disease without esophagitis: Secondary | ICD-10-CM

## 2020-07-04 DIAGNOSIS — G5603 Carpal tunnel syndrome, bilateral upper limbs: Secondary | ICD-10-CM | POA: Diagnosis not present

## 2020-07-12 DIAGNOSIS — M545 Low back pain, unspecified: Secondary | ICD-10-CM | POA: Diagnosis not present

## 2020-07-12 DIAGNOSIS — M461 Sacroiliitis, not elsewhere classified: Secondary | ICD-10-CM | POA: Diagnosis not present

## 2020-07-15 ENCOUNTER — Other Ambulatory Visit (HOSPITAL_BASED_OUTPATIENT_CLINIC_OR_DEPARTMENT_OTHER): Payer: Self-pay

## 2020-07-15 DIAGNOSIS — G4733 Obstructive sleep apnea (adult) (pediatric): Secondary | ICD-10-CM

## 2020-07-26 ENCOUNTER — Ambulatory Visit: Payer: Medicaid Other | Attending: Neurology | Admitting: Neurology

## 2020-07-26 ENCOUNTER — Other Ambulatory Visit: Payer: Self-pay

## 2020-07-26 DIAGNOSIS — G4733 Obstructive sleep apnea (adult) (pediatric): Secondary | ICD-10-CM | POA: Diagnosis not present

## 2020-07-26 DIAGNOSIS — G4761 Periodic limb movement disorder: Secondary | ICD-10-CM | POA: Diagnosis not present

## 2020-08-01 NOTE — Procedures (Signed)
Dayton A. Merlene Laughter, MD     [email or web address]             NOCTURNAL POLYSOMNOGRAPHY   LOCATION: [facility]  Patient Name: Taylor [name], Taylor [name] Study Date: 07/26/2020 Gender: Female D.O.B: [birth year] Age (years): 29 Referring Provider: Barton Fanny NP Height (inches): 66 Interpreting Physician: Phillips Odor MD, ABSM Weight (lbs): 273 RPSGT: Peak, Robert BMI: 47 MRN: [id number] Neck Size: 16.00 CLINICAL INFORMATION Sleep Study Type: NPSG     Indication for sleep study: OSA     Epworth Sleepiness Score: 2      Most recent polysomnogram dated 12/14/2018 revealed an AHI of 2.8/h and RDI of 7.0/h. SLEEP STUDY TECHNIQUE As per the AASM Manual for the Scoring of Sleep and Associated Events v2.3 (April 2016) with a hypopnea requiring 4% desaturations.  The channels recorded and monitored were frontal, central and occipital EEG, electrooculogram (EOG), submentalis EMG (chin), nasal and oral airflow, thoracic and abdominal wall motion, anterior tibialis EMG, snore microphone, electrocardiogram, and pulse oximetry.  MEDICATIONS Medications self-administered by patient taken the night of the study : N/A  Current Outpatient Medications:  .  B Complex-C (SUPER B COMPLEX PO), Take 1 tablet by mouth daily., Disp: , Rfl:  .  buprenorphine (BUTRANS) 7.5 MCG/HR, Place onto the skin once a week., Disp: , Rfl:  .  buPROPion (WELLBUTRIN XL) 150 MG 24 hr tablet, Take 1 tablet (150 mg total) by mouth daily., Disp: 90 tablet, Rfl: 1 .  cetirizine (ZYRTEC) 10 MG tablet, Take 10 mg by mouth daily.  (Patient not taking: Reported on 05/28/2020), Disp: , Rfl:  .  diclofenac (VOLTAREN) 75 MG EC tablet, Take 1 tablet (75 mg total) by mouth 2 (two) times daily., Disp: 60 tablet, Rfl: 2 .  diphenhydrAMINE (BENADRYL) 25 MG tablet, Take 25 mg by mouth every 6 (six) hours as needed for allergies. (Patient not taking: Reported on 05/28/2020), Disp: , Rfl:  .  doxylamine, Sleep,  (UNISOM) 25 MG tablet, Take 25 mg by mouth at bedtime as needed for sleep. , Disp: , Rfl:  .  DULoxetine (CYMBALTA) 60 MG capsule, Take 60 mg by mouth daily., Disp: , Rfl:  .  gabapentin (NEURONTIN) 600 MG tablet, Take 1 tablet (600 mg total) by mouth 3 (three) times daily., Disp: 90 tablet, Rfl: 2 .  HYDROcodone-acetaminophen (NORCO) 10-325 MG tablet, Take 1 tablet by mouth 3 (three) times daily as needed., Disp: , Rfl:  .  Multiple Vitamin (MULTIVITAMIN) tablet, Take 1 tablet by mouth daily., Disp: , Rfl:  .  omeprazole (PRILOSEC) 40 MG capsule, TAKE 1 CAPSULE(40 MG) BY MOUTH DAILY, Disp: 30 capsule, Rfl: 5      SLEEP ARCHITECTURE The study was initiated at 9:07:42 PM and ended at 5:07:52 AM.  Sleep onset time was 21.9 minutes and the sleep efficiency was 89.3%. The total sleep time was 428.8 minutes.  Stage REM latency was 110.5 minutes.  The patient spent 2.33% of the night in stage N1 sleep, 61.22% in stage N2 sleep, 10.73% in stage N3 and 25.7% in REM.  Alpha intrusion was absent.  Supine sleep was 40.93%.  RESPIRATORY PARAMETERS The overall apnea/hypopnea index (AHI) was 7.8 per hour. There were 2 total apneas, including 1 obstructive, 1 central and 0 mixed apneas. There were 54 hypopneas and 11 RERAs.  The AHI during Stage REM sleep was 14.1 per hour.  AHI while supine was 17.8 per hour.  The mean oxygen saturation was 91.76%. The minimum SpO2 during sleep was  82.00%.  moderate snoring was noted during this study.  CARDIAC DATA The 2 lead EKG demonstrated sinus rhythm. The mean heart rate was 84.32 beats per minute. Other EKG findings include: None.  LEG MOVEMENT DATA The total PLMS were 183 with a resulting PLMS index of 25.61. Associated arousal with leg movement index was 0.7.  IMPRESSIONS 1. 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 The severity does not require positive pressure treatment. 2. Moderate periodic  limb movements are also noted.   Delano Metz, MD Diplomate, American Board of Sleep Medicine. ELECTRONICALLY SIGNED ON:  08/01/2020, 5:15 PM [place] PH: [phone]   FX: [phone] [place]

## 2020-08-13 DIAGNOSIS — M79672 Pain in left foot: Secondary | ICD-10-CM | POA: Diagnosis not present

## 2020-08-13 DIAGNOSIS — G5603 Carpal tunnel syndrome, bilateral upper limbs: Secondary | ICD-10-CM | POA: Diagnosis not present

## 2020-08-13 DIAGNOSIS — M79604 Pain in right leg: Secondary | ICD-10-CM | POA: Diagnosis not present

## 2020-08-13 DIAGNOSIS — M461 Sacroiliitis, not elsewhere classified: Secondary | ICD-10-CM | POA: Diagnosis not present

## 2020-08-13 DIAGNOSIS — M5416 Radiculopathy, lumbar region: Secondary | ICD-10-CM | POA: Diagnosis not present

## 2020-08-13 DIAGNOSIS — F341 Dysthymic disorder: Secondary | ICD-10-CM | POA: Diagnosis not present

## 2020-08-13 DIAGNOSIS — J302 Other seasonal allergic rhinitis: Secondary | ICD-10-CM | POA: Diagnosis not present

## 2020-08-13 DIAGNOSIS — M545 Low back pain, unspecified: Secondary | ICD-10-CM | POA: Diagnosis not present

## 2020-08-13 DIAGNOSIS — Z79891 Long term (current) use of opiate analgesic: Secondary | ICD-10-CM | POA: Diagnosis not present

## 2020-08-13 DIAGNOSIS — G4733 Obstructive sleep apnea (adult) (pediatric): Secondary | ICD-10-CM | POA: Diagnosis not present

## 2020-08-13 DIAGNOSIS — G4700 Insomnia, unspecified: Secondary | ICD-10-CM | POA: Diagnosis not present

## 2020-08-13 DIAGNOSIS — K219 Gastro-esophageal reflux disease without esophagitis: Secondary | ICD-10-CM | POA: Diagnosis not present

## 2020-08-20 ENCOUNTER — Other Ambulatory Visit (HOSPITAL_COMMUNITY): Payer: Medicaid Other

## 2020-08-26 ENCOUNTER — Other Ambulatory Visit (HOSPITAL_COMMUNITY): Payer: Self-pay

## 2020-08-26 DIAGNOSIS — D72829 Elevated white blood cell count, unspecified: Secondary | ICD-10-CM

## 2020-08-27 ENCOUNTER — Inpatient Hospital Stay (HOSPITAL_COMMUNITY): Payer: Medicaid Other | Admitting: Physician Assistant

## 2020-08-27 ENCOUNTER — Inpatient Hospital Stay (HOSPITAL_COMMUNITY): Payer: Medicaid Other

## 2020-09-04 ENCOUNTER — Ambulatory Visit: Payer: Medicaid Other | Admitting: Family Medicine

## 2020-09-05 NOTE — Telephone Encounter (Signed)
Called patient have her scheduled in the morning with Blanch Media

## 2020-09-06 ENCOUNTER — Ambulatory Visit: Payer: Medicaid Other | Admitting: Family Medicine

## 2020-09-06 ENCOUNTER — Other Ambulatory Visit: Payer: Self-pay

## 2020-09-06 ENCOUNTER — Encounter: Payer: Self-pay | Admitting: Family Medicine

## 2020-09-06 VITALS — BP 120/79 | HR 83 | Temp 98.2°F | Ht 64.0 in | Wt 251.6 lb

## 2020-09-06 DIAGNOSIS — M5442 Lumbago with sciatica, left side: Secondary | ICD-10-CM

## 2020-09-06 DIAGNOSIS — M546 Pain in thoracic spine: Secondary | ICD-10-CM

## 2020-09-06 DIAGNOSIS — M5441 Lumbago with sciatica, right side: Secondary | ICD-10-CM | POA: Diagnosis not present

## 2020-09-06 DIAGNOSIS — G8929 Other chronic pain: Secondary | ICD-10-CM | POA: Diagnosis not present

## 2020-09-06 DIAGNOSIS — M5412 Radiculopathy, cervical region: Secondary | ICD-10-CM

## 2020-09-06 NOTE — Progress Notes (Signed)
Assessment & Plan:  1. Chronic midline low back pain with bilateral sciatica - Ambulatory referral to Neurosurgery - [place]; Future  2. Chronic midline thoracic back pain - Ambulatory referral to Neurosurgery - [place]; Future  3. Cervical radiculopathy - Ambulatory referral to Neurosurgery - MR TOTAL SPINE W WO CONTRAST; Future   Follow up plan: Return if symptoms worsen or fail to improve.  Hendricks Limes, MSN, APRN, FNP-C [facility]  Subjective:   Patient ID: Taylor [name], female    DOB: 11-08-1990, 30 y.o.   MRN: [id number]  HPI: Taylor [name] is a 30 y.o. female presenting on 09/06/2020 for Back Pain (Patient states that she has been having spinal pain that has been going on for years but has gotten worse and now is causing left shoulder pain. Patient states that when she has the pain it makes her bilateral feet numbness. )  Patient reports she has been having back pain for many years, but that it is worsening recently.  She describes her pain as pressure and stabbing with radiation of a tingling sensation to her shoulders.  Recently the pain started going down her left arm.  Her pain is worsened with reaching and lifting.  Additionally her feet are going numb.  She had a MRI in April 2021 which showed a disc protrusion at L5-S1.  She is taking gabapentin and hydrocodone as needed for pain, she has a Butrans patch, and has been getting injections in her low back by her neurologist.   ROS: Negative unless specifically indicated above in HPI.   Relevant past medical history reviewed and updated as indicated.   Allergies and medications reviewed and updated.   Current Outpatient Medications:  .  B Complex-C (SUPER B COMPLEX PO), Take 1 tablet by mouth daily., Disp: , Rfl:  .  buprenorphine (BUTRANS) 7.5 MCG/HR, Place onto the skin once a week., Disp: , Rfl:  .  buPROPion (WELLBUTRIN XL) 150 MG 24 hr  tablet, Take 1 tablet (150 mg total) by mouth daily., Disp: 90 tablet, Rfl: 1 .  cetirizine (ZYRTEC) 10 MG tablet, Take 10 mg by mouth daily., Disp: , Rfl:  .  diclofenac (VOLTAREN) 75 MG EC tablet, Take 1 tablet (75 mg total) by mouth 2 (two) times daily., Disp: 60 tablet, Rfl: 2 .  diphenhydrAMINE (BENADRYL) 25 MG tablet, Take 25 mg by mouth every 6 (six) hours as needed for allergies., Disp: , Rfl:  .  doxylamine, Sleep, (UNISOM) 25 MG tablet, Take 25 mg by mouth at bedtime as needed for sleep. , Disp: , Rfl:  .  DULoxetine (CYMBALTA) 60 MG capsule, Take 60 mg by mouth daily., Disp: , Rfl:  .  HYDROcodone-acetaminophen (NORCO) 10-325 MG tablet, Take 1 tablet by mouth 3 (three) times daily as needed., Disp: , Rfl:  .  Multiple Vitamin (MULTIVITAMIN) tablet, Take 1 tablet by mouth daily., Disp: , Rfl:  .  omeprazole (PRILOSEC) 40 MG capsule, TAKE 1 CAPSULE(40 MG) BY MOUTH DAILY, Disp: 30 capsule, Rfl: 5 .  gabapentin (NEURONTIN) 600 MG tablet, Take 1 tablet (600 mg total) by mouth 3 (three) times daily., Disp: 90 tablet, Rfl: 2  Allergies  Allergen Reactions  . Other Itching    Animal Dander    Objective:   BP 120/79   Pulse 83   Temp 98.2 F (36.8 C) (Temporal)   Ht 5\' 4"  (1.626 m)   Wt 251 lb  9.6 oz (114.1 kg)   SpO2 94%   BMI 43.19 kg/m    Physical Exam Vitals reviewed.  Constitutional:      General: She is not in acute distress.    Appearance: Normal appearance. She is not ill-appearing, toxic-appearing or diaphoretic.  HENT:     Head: Normocephalic and atraumatic.  Eyes:     General: No scleral icterus.       Right eye: No discharge.        Left eye: No discharge.     Conjunctiva/sclera: Conjunctivae normal.  Cardiovascular:     Rate and Rhythm: Normal rate.  Pulmonary:     Effort: Pulmonary effort is normal. No respiratory distress.  Musculoskeletal:        General: Normal range of motion.     Cervical back: Normal range of motion.     Thoracic back: Bony  tenderness present.     Lumbar back: Bony tenderness present.  Skin:    General: Skin is warm and dry.     Capillary Refill: Capillary refill takes less than 2 seconds.  Neurological:     General: No focal deficit present.     Mental Status: She is alert and oriented to person, place, and time. Mental status is at baseline.  Psychiatric:        Mood and Affect: Mood normal.        Behavior: Behavior normal.        Thought Content: Thought content normal.        Judgment: Judgment normal.

## 2020-09-11 ENCOUNTER — Encounter: Payer: Self-pay | Admitting: Family Medicine

## 2020-09-11 ENCOUNTER — Other Ambulatory Visit: Payer: Self-pay

## 2020-09-11 ENCOUNTER — Other Ambulatory Visit: Payer: Self-pay | Admitting: Family

## 2020-09-11 ENCOUNTER — Inpatient Hospital Stay (HOSPITAL_COMMUNITY): Payer: Medicaid Other | Attending: Hematology

## 2020-09-11 ENCOUNTER — Inpatient Hospital Stay (HOSPITAL_BASED_OUTPATIENT_CLINIC_OR_DEPARTMENT_OTHER): Payer: Medicaid Other | Admitting: Physician Assistant

## 2020-09-11 VITALS — HR 96 | Resp 18 | Wt 250.0 lb

## 2020-09-11 DIAGNOSIS — D72829 Elevated white blood cell count, unspecified: Secondary | ICD-10-CM | POA: Diagnosis not present

## 2020-09-11 DIAGNOSIS — Z79899 Other long term (current) drug therapy: Secondary | ICD-10-CM | POA: Diagnosis not present

## 2020-09-11 DIAGNOSIS — F17210 Nicotine dependence, cigarettes, uncomplicated: Secondary | ICD-10-CM | POA: Diagnosis not present

## 2020-09-11 LAB — CBC WITH DIFFERENTIAL/PLATELET
Abs Immature Granulocytes: 0.02 10*3/uL (ref 0.00–0.07)
Basophils Absolute: 0.1 10*3/uL (ref 0.0–0.1)
Basophils Relative: 1 %
Eosinophils Absolute: 0.4 10*3/uL (ref 0.0–0.5)
Eosinophils Relative: 5 %
HCT: 41.1 % (ref 36.0–46.0)
Hemoglobin: 13.8 g/dL (ref 12.0–15.0)
Immature Granulocytes: 0 %
Lymphocytes Relative: 31 %
Lymphs Abs: 2.2 10*3/uL (ref 0.7–4.0)
MCH: 32.2 pg (ref 26.0–34.0)
MCHC: 33.6 g/dL (ref 30.0–36.0)
MCV: 95.8 fL (ref 80.0–100.0)
Monocytes Absolute: 0.4 10*3/uL (ref 0.1–1.0)
Monocytes Relative: 6 %
Neutro Abs: 4.1 10*3/uL (ref 1.7–7.7)
Neutrophils Relative %: 57 %
Platelets: 187 10*3/uL (ref 150–400)
RBC: 4.29 MIL/uL (ref 3.87–5.11)
RDW: 12.5 % (ref 11.5–15.5)
WBC: 7.1 10*3/uL (ref 4.0–10.5)
nRBC: 0 % (ref 0.0–0.2)

## 2020-09-11 LAB — COMPREHENSIVE METABOLIC PANEL
ALT: 39 U/L (ref 0–44)
AST: 24 U/L (ref 15–41)
Albumin: 4.4 g/dL (ref 3.5–5.0)
Alkaline Phosphatase: 57 U/L (ref 38–126)
Anion gap: 8 (ref 5–15)
BUN: 11 mg/dL (ref 6–20)
CO2: 25 mmol/L (ref 22–32)
Calcium: 9.2 mg/dL (ref 8.9–10.3)
Chloride: 104 mmol/L (ref 98–111)
Creatinine, Ser: 0.52 mg/dL (ref 0.44–1.00)
GFR, Estimated: >60 mL/min (ref >60)
Glucose, Bld: 94 mg/dL (ref 70–99)
Potassium: 3.6 mmol/L (ref 3.5–5.1)
Sodium: 137 mmol/L (ref 135–145)
Total Bilirubin: 0.5 mg/dL (ref 0.3–1.2)
Total Protein: 7.3 g/dL (ref 6.5–8.1)

## 2020-09-11 LAB — LACTATE DEHYDROGENASE: LDH: 128 U/L (ref 98–192)

## 2020-09-11 NOTE — Patient Instructions (Signed)
[place] [facility] Discharge Instructions  You were seen today by Tarri Abernethy PA-C for your elevated white blood cell count.  This is due to your smoking as well as a mutation called CALR.  This does not require treatment, but is something that we will continue to monitor.    LABS: Labs today, then repeat labs in 6 months.  OTHER TESTS: Nne  MEDICATIONS: No changes to your home medications.  FOLLOW-UP APPOINTMENT: Return in 6 months   Thank you for choosing [place] at [facility] to provide your oncology and hematology care.  To afford each patient quality time with our provider, please arrive at least 15 minutes before your scheduled appointment time.   If you have a lab appointment with the [facility] please come in thru the Main Entrance and check in at the main information desk.  You need to re-schedule your appointment should you arrive 10 or more minutes late.  We strive to give you quality time with our providers, and arriving late affects you and other patients whose appointments are after yours.  Also, if you no show three or more times for appointments you may be dismissed from the clinic at the providers discretion.     Again, thank you for choosing [facility].  Our hope is that these requests will decrease the amount of time that you wait before being seen by our physicians.       _____________________________________________________________  Should you have questions after your visit to [facility], please contact our office at [phone] and follow the prompts.  Our office hours are 8:00 a.m. and 4:30 p.m. Monday - Friday.  Please note that voicemails left after 4:00 p.m. may not be returned until the following business day.  We are closed weekends and major holidays.  You do have access to a nurse 24-7, just call the main number to the clinic [phone] and do not press any options,  hold on the line and a nurse will answer the phone.    For prescription refill requests, have your pharmacy contact our office and allow 72 hours.    Due to Covid, you will need to wear a mask upon entering the hospital. If you do not have a mask, a mask will be given to you at the Main Entrance upon arrival. For doctor visits, patients may have 1 support person age 49 or older with them. For treatment visits, patients can not have anyone with them due to social distancing guidelines and our immunocompromised population.
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
## 2020-09-12 ENCOUNTER — Telehealth (HOSPITAL_COMMUNITY): Payer: Self-pay

## 2020-09-12 NOTE — Telephone Encounter (Signed)
This Nurse spoke with patient this morning per request of physicians assistant.  Advised her that all of her labs look good, and the physicians assistant ordered repeat labs and to see her back in the office in 6 months. Patient acknowledged understanding.  No further questions or concerns at this time.

## 2020-09-19 ENCOUNTER — Telehealth: Payer: Self-pay | Admitting: Family Medicine

## 2020-09-19 DIAGNOSIS — M5412 Radiculopathy, cervical region: Secondary | ICD-10-CM

## 2020-09-19 DIAGNOSIS — M5442 Lumbago with sciatica, left side: Secondary | ICD-10-CM

## 2020-09-19 DIAGNOSIS — G8929 Other chronic pain: Secondary | ICD-10-CM

## 2020-09-19 NOTE — Telephone Encounter (Signed)
Orders needed to replace MRI whole spine entered.

## 2020-09-20 NOTE — Progress Notes (Signed)
This encounter was created in error - please disregard.

## 2020-09-24 ENCOUNTER — Other Ambulatory Visit: Payer: Self-pay | Admitting: Physician Assistant

## 2020-09-24 ENCOUNTER — Encounter (HOSPITAL_COMMUNITY): Payer: Self-pay | Admitting: *Deleted

## 2020-09-24 ENCOUNTER — Emergency Department (HOSPITAL_COMMUNITY)
Admission: EM | Admit: 2020-09-24 | Discharge: 2020-09-24 | Disposition: A | Discharge status: Home / Self Care | Payer: Medicaid Other | Attending: Emergency Medicine | Admitting: Emergency Medicine

## 2020-09-24 ENCOUNTER — Other Ambulatory Visit: Payer: Self-pay

## 2020-09-24 DIAGNOSIS — Z5321 Procedure and treatment not carried out due to patient leaving prior to being seen by health care provider: Secondary | ICD-10-CM | POA: Diagnosis not present

## 2020-09-24 DIAGNOSIS — R202 Paresthesia of skin: Secondary | ICD-10-CM | POA: Diagnosis not present

## 2020-09-24 NOTE — ED Notes (Signed)
States she has issues with transportation and has to leave. Will return if needed

## 2020-09-24 NOTE — ED Triage Notes (Signed)
States she has been tingling all over for 2 days.

## 2020-10-04 ENCOUNTER — Encounter: Payer: Self-pay | Admitting: Emergency Medicine

## 2020-10-04 ENCOUNTER — Ambulatory Visit
Admission: EM | Admit: 2020-10-04 | Discharge: 2020-10-04 | Disposition: A | Discharge status: Home / Self Care | Payer: Medicaid Other | Attending: Emergency Medicine | Admitting: Emergency Medicine

## 2020-10-04 ENCOUNTER — Encounter: Payer: Self-pay | Admitting: Family

## 2020-10-04 ENCOUNTER — Other Ambulatory Visit: Payer: Self-pay

## 2020-10-04 ENCOUNTER — Ambulatory Visit: Payer: Medicaid Other | Admitting: Family

## 2020-10-04 DIAGNOSIS — J069 Acute upper respiratory infection, unspecified: Secondary | ICD-10-CM

## 2020-10-04 DIAGNOSIS — R930 Abnormal findings on diagnostic imaging of skull and head, not elsewhere classified: Secondary | ICD-10-CM

## 2020-10-04 DIAGNOSIS — R202 Paresthesia of skin: Secondary | ICD-10-CM

## 2020-10-04 DIAGNOSIS — H66001 Acute suppurative otitis media without spontaneous rupture of ear drum, right ear: Secondary | ICD-10-CM

## 2020-10-04 DIAGNOSIS — R062 Wheezing: Secondary | ICD-10-CM

## 2020-10-04 DIAGNOSIS — J209 Acute bronchitis, unspecified: Secondary | ICD-10-CM

## 2020-10-04 DIAGNOSIS — G43009 Migraine without aura, not intractable, without status migrainosus: Secondary | ICD-10-CM | POA: Diagnosis not present

## 2020-10-04 DIAGNOSIS — F4310 Post-traumatic stress disorder, unspecified: Secondary | ICD-10-CM | POA: Diagnosis not present

## 2020-10-04 DIAGNOSIS — F4312 Post-traumatic stress disorder, chronic: Secondary | ICD-10-CM | POA: Insufficient documentation

## 2020-10-04 MED ORDER — BENZONATATE 100 MG PO CAPS: 100.0000 mg | ORAL_CAPSULE | Freq: Three times a day (TID) | ORAL | 0 refills | Status: DC

## 2020-10-04 MED ORDER — PREDNISONE 10 MG (21) PO TBPK: ORAL_TABLET | Freq: Every day | ORAL | 0 refills | Status: DC

## 2020-10-04 MED ORDER — AMOXICILLIN 500 MG PO CAPS: 500.0000 mg | ORAL_CAPSULE | Freq: Three times a day (TID) | ORAL | 0 refills | Status: DC

## 2020-10-04 MED ORDER — AMOXICILLIN 500 MG PO CAPS: 500.0000 mg | ORAL_CAPSULE | Freq: Two times a day (BID) | ORAL | 0 refills | Status: AC

## 2020-10-04 MED ORDER — DEXAMETHASONE SODIUM PHOSPHATE 10 MG/ML IJ SOLN
10.0000 mg | Freq: Once | INTRAMUSCULAR | Status: AC
  Administered 2020-10-04: 10 mg via INTRAMUSCULAR

## 2020-10-04 NOTE — Discharge Instructions (Signed)
Get plenty of rest and push fluids Tessalon Perles prescribed as needed for cough Use OTC zyrtec for nasal congestion, runny nose, and/or sore throat Use OTC flonase for nasal congestion and runny nose Use medications daily for symptom relief Use OTC medications like ibuprofen or tylenol as needed fever or pain Call or go to the ED if you have any new or worsening symptoms such as fever, cough, shortness of breath, chest tightness, chest pain, turning blue, changes in mental status, etc..Marland Kitchen
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
## 2020-10-04 NOTE — ED Triage Notes (Signed)
Runny nose and congestion for the past few days
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
## 2020-10-05 LAB — COVID-19, FLU A+B NAA
Influenza A, NAA: NOT DETECTED
Influenza B, NAA: NOT DETECTED
SARS-CoV-2, NAA: NOT DETECTED

## 2020-10-08 ENCOUNTER — Other Ambulatory Visit: Payer: Self-pay

## 2020-10-08 ENCOUNTER — Encounter (HOSPITAL_BASED_OUTPATIENT_CLINIC_OR_DEPARTMENT_OTHER): Payer: Self-pay | Admitting: Orthopedic Surgery

## 2020-10-08 ENCOUNTER — Other Ambulatory Visit: Payer: Self-pay | Admitting: Physician Assistant

## 2020-10-08 DIAGNOSIS — G4733 Obstructive sleep apnea (adult) (pediatric): Secondary | ICD-10-CM | POA: Diagnosis not present

## 2020-10-08 DIAGNOSIS — M461 Sacroiliitis, not elsewhere classified: Secondary | ICD-10-CM | POA: Diagnosis not present

## 2020-10-08 DIAGNOSIS — M79604 Pain in right leg: Secondary | ICD-10-CM | POA: Diagnosis not present

## 2020-10-08 DIAGNOSIS — M545 Low back pain, unspecified: Secondary | ICD-10-CM | POA: Diagnosis not present

## 2020-10-08 DIAGNOSIS — J302 Other seasonal allergic rhinitis: Secondary | ICD-10-CM | POA: Diagnosis not present

## 2020-10-08 DIAGNOSIS — K219 Gastro-esophageal reflux disease without esophagitis: Secondary | ICD-10-CM | POA: Diagnosis not present

## 2020-10-08 DIAGNOSIS — Z79891 Long term (current) use of opiate analgesic: Secondary | ICD-10-CM | POA: Diagnosis not present

## 2020-10-08 DIAGNOSIS — F341 Dysthymic disorder: Secondary | ICD-10-CM | POA: Diagnosis not present

## 2020-10-08 DIAGNOSIS — M5416 Radiculopathy, lumbar region: Secondary | ICD-10-CM | POA: Diagnosis not present

## 2020-10-08 DIAGNOSIS — G5603 Carpal tunnel syndrome, bilateral upper limbs: Secondary | ICD-10-CM | POA: Diagnosis not present

## 2020-10-08 DIAGNOSIS — G4700 Insomnia, unspecified: Secondary | ICD-10-CM | POA: Diagnosis not present

## 2020-10-08 DIAGNOSIS — M79672 Pain in left foot: Secondary | ICD-10-CM | POA: Diagnosis not present

## 2020-10-08 NOTE — Progress Notes (Signed)
Voice mail left with Dr. Jess Barters surgery scheduler and also a secure chat message was sent to [place] his PA to have the surgery posting as well as the procedure order changed from LEFT ankle to RIGHT ankle per pt PAT call.

## 2020-10-11 NOTE — Progress Notes (Signed)

## 2020-10-14 ENCOUNTER — Ambulatory Visit (HOSPITAL_COMMUNITY)
Admission: RE | Admit: 2020-10-14 | Discharge: 2020-10-14 | Disposition: A | Discharge status: Home / Self Care | Payer: Medicaid Other | Source: Ambulatory Visit | Attending: Family Medicine | Admitting: Family Medicine

## 2020-10-14 DIAGNOSIS — G8929 Other chronic pain: Secondary | ICD-10-CM

## 2020-10-14 DIAGNOSIS — M5441 Lumbago with sciatica, right side: Secondary | ICD-10-CM | POA: Insufficient documentation

## 2020-10-14 DIAGNOSIS — M542 Cervicalgia: Secondary | ICD-10-CM | POA: Diagnosis not present

## 2020-10-14 DIAGNOSIS — M5412 Radiculopathy, cervical region: Secondary | ICD-10-CM | POA: Diagnosis not present

## 2020-10-14 DIAGNOSIS — M5442 Lumbago with sciatica, left side: Secondary | ICD-10-CM | POA: Insufficient documentation

## 2020-10-14 DIAGNOSIS — M546 Pain in thoracic spine: Secondary | ICD-10-CM | POA: Diagnosis not present

## 2020-10-14 DIAGNOSIS — M545 Low back pain, unspecified: Secondary | ICD-10-CM | POA: Diagnosis not present

## 2020-10-15 ENCOUNTER — Ambulatory Visit (HOSPITAL_BASED_OUTPATIENT_CLINIC_OR_DEPARTMENT_OTHER): Payer: Medicaid Other | Admitting: Anesthesiology

## 2020-10-15 ENCOUNTER — Other Ambulatory Visit: Payer: Self-pay

## 2020-10-15 ENCOUNTER — Encounter (HOSPITAL_BASED_OUTPATIENT_CLINIC_OR_DEPARTMENT_OTHER): Payer: Self-pay | Admitting: Orthopedic Surgery

## 2020-10-15 ENCOUNTER — Telehealth: Payer: Self-pay | Admitting: Family

## 2020-10-15 ENCOUNTER — Encounter (HOSPITAL_BASED_OUTPATIENT_CLINIC_OR_DEPARTMENT_OTHER)
Admission: RE | Disposition: A | Discharge status: Home / Self Care | Payer: Self-pay | Source: Ambulatory Visit | Attending: Orthopedic Surgery

## 2020-10-15 ENCOUNTER — Ambulatory Visit (HOSPITAL_BASED_OUTPATIENT_CLINIC_OR_DEPARTMENT_OTHER)
Admission: RE | Admit: 2020-10-15 | Discharge: 2020-10-15 | Disposition: A | Discharge status: Home / Self Care | Payer: Medicaid Other | Source: Ambulatory Visit | Attending: Orthopedic Surgery | Admitting: Orthopedic Surgery

## 2020-10-15 DIAGNOSIS — M65871 Other synovitis and tenosynovitis, right ankle and foot: Secondary | ICD-10-CM | POA: Insufficient documentation

## 2020-10-15 DIAGNOSIS — M25871 Other specified joint disorders, right ankle and foot: Secondary | ICD-10-CM

## 2020-10-15 DIAGNOSIS — F17210 Nicotine dependence, cigarettes, uncomplicated: Secondary | ICD-10-CM | POA: Diagnosis not present

## 2020-10-15 DIAGNOSIS — M25271 Flail joint, right ankle and foot: Secondary | ICD-10-CM | POA: Diagnosis not present

## 2020-10-15 DIAGNOSIS — S93491A Sprain of other ligament of right ankle, initial encounter: Secondary | ICD-10-CM

## 2020-10-15 DIAGNOSIS — G43009 Migraine without aura, not intractable, without status migrainosus: Secondary | ICD-10-CM | POA: Diagnosis not present

## 2020-10-15 DIAGNOSIS — G8918 Other acute postprocedural pain: Secondary | ICD-10-CM | POA: Diagnosis not present

## 2020-10-15 DIAGNOSIS — M24271 Disorder of ligament, right ankle: Secondary | ICD-10-CM | POA: Diagnosis not present

## 2020-10-15 DIAGNOSIS — S93491S Sprain of other ligament of right ankle, sequela: Secondary | ICD-10-CM

## 2020-10-15 DIAGNOSIS — F4310 Post-traumatic stress disorder, unspecified: Secondary | ICD-10-CM | POA: Diagnosis not present

## 2020-10-15 HISTORY — PX: ANKLE ARTHROSCOPY: SHX545

## 2020-10-15 LAB — POCT PREGNANCY, URINE: Preg Test, Ur: NEGATIVE

## 2020-10-15 SURGERY — ARTHROSCOPY, ANKLE
Anesthesia: General | Site: Ankle | Laterality: Right

## 2020-10-15 MED ORDER — FENTANYL CITRATE (PF) 100 MCG/2ML IJ SOLN
100.0000 ug | Freq: Once | INTRAMUSCULAR | Status: AC
  Administered 2020-10-15: 100 ug via INTRAVENOUS

## 2020-10-15 MED ORDER — ONDANSETRON HCL 4 MG/2ML IJ SOLN
INTRAMUSCULAR | Status: AC
  Filled 2020-10-15: qty 2

## 2020-10-15 MED ORDER — PROPOFOL 10 MG/ML IV BOLUS
INTRAVENOUS | Status: DC | PRN
  Administered 2020-10-15: 200 mg via INTRAVENOUS

## 2020-10-15 MED ORDER — DEXAMETHASONE SODIUM PHOSPHATE 10 MG/ML IJ SOLN
INTRAMUSCULAR | Status: DC | PRN
  Administered 2020-10-15: 10 mg via INTRAVENOUS

## 2020-10-15 MED ORDER — LIDOCAINE 2% (20 MG/ML) 5 ML SYRINGE
INTRAMUSCULAR | Status: DC | PRN
  Administered 2020-10-15: 60 mg via INTRAVENOUS

## 2020-10-15 MED ORDER — MIDAZOLAM HCL 2 MG/2ML IJ SOLN
2.0000 mg | Freq: Once | INTRAMUSCULAR | Status: AC
  Administered 2020-10-15: 2 mg via INTRAVENOUS

## 2020-10-15 MED ORDER — LIDOCAINE HCL (PF) 2 % IJ SOLN
INTRAMUSCULAR | Status: AC
  Filled 2020-10-15: qty 5

## 2020-10-15 MED ORDER — MEPERIDINE HCL 25 MG/ML IJ SOLN: 6.2500 mg | INTRAMUSCULAR | Status: DC | PRN

## 2020-10-15 MED ORDER — ASPIRIN EC 325 MG PO TBEC: 325.0000 mg | DELAYED_RELEASE_TABLET | Freq: Every day | ORAL | 0 refills | Status: DC

## 2020-10-15 MED ORDER — FENTANYL CITRATE (PF) 100 MCG/2ML IJ SOLN
INTRAMUSCULAR | Status: DC | PRN
  Administered 2020-10-15: 25 ug via INTRAVENOUS
  Administered 2020-10-15: 50 ug via INTRAVENOUS
  Administered 2020-10-15: 25 ug via INTRAVENOUS

## 2020-10-15 MED ORDER — FENTANYL CITRATE (PF) 100 MCG/2ML IJ SOLN
INTRAMUSCULAR | Status: AC
  Filled 2020-10-15: qty 2

## 2020-10-15 MED ORDER — HYDROMORPHONE HCL 1 MG/ML IJ SOLN
0.2500 mg | INTRAMUSCULAR | Status: DC | PRN
  Administered 2020-10-15 (×2): 0.5 mg via INTRAVENOUS

## 2020-10-15 MED ORDER — OXYCODONE HCL 5 MG PO TABS
5.0000 mg | ORAL_TABLET | Freq: Once | ORAL | Status: AC | PRN
  Administered 2020-10-15: 5 mg via ORAL

## 2020-10-15 MED ORDER — ROPIVACAINE HCL 5 MG/ML IJ SOLN
INTRAMUSCULAR | Status: DC | PRN
  Administered 2020-10-15: 30 mL via PERINEURAL

## 2020-10-15 MED ORDER — PROMETHAZINE HCL 25 MG/ML IJ SOLN: 6.2500 mg | INTRAMUSCULAR | Status: DC | PRN

## 2020-10-15 MED ORDER — SODIUM CHLORIDE 0.9 % IR SOLN
Status: DC | PRN
  Administered 2020-10-15: 700 mL

## 2020-10-15 MED ORDER — CEFAZOLIN SODIUM-DEXTROSE 2-4 GM/100ML-% IV SOLN
INTRAVENOUS | Status: AC
  Filled 2020-10-15: qty 100

## 2020-10-15 MED ORDER — CEFAZOLIN SODIUM-DEXTROSE 2-4 GM/100ML-% IV SOLN
2.0000 g | INTRAVENOUS | Status: AC
  Administered 2020-10-15: 2 g via INTRAVENOUS

## 2020-10-15 MED ORDER — ONDANSETRON HCL 4 MG/2ML IJ SOLN
INTRAMUSCULAR | Status: DC | PRN
  Administered 2020-10-15: 4 mg via INTRAVENOUS

## 2020-10-15 MED ORDER — HYDROMORPHONE HCL 1 MG/ML IJ SOLN
INTRAMUSCULAR | Status: AC
  Filled 2020-10-15: qty 0.5

## 2020-10-15 MED ORDER — OXYCODONE HCL 5 MG PO TABS
ORAL_TABLET | ORAL | Status: AC
  Filled 2020-10-15: qty 1

## 2020-10-15 MED ORDER — DEXAMETHASONE SODIUM PHOSPHATE 10 MG/ML IJ SOLN
INTRAMUSCULAR | Status: AC
  Filled 2020-10-15: qty 1

## 2020-10-15 MED ORDER — AMISULPRIDE (ANTIEMETIC) 5 MG/2ML IV SOLN: 10.0000 mg | Freq: Once | INTRAVENOUS | Status: DC | PRN

## 2020-10-15 MED ORDER — LACTATED RINGERS IV SOLN: INTRAVENOUS | Status: DC

## 2020-10-15 MED ORDER — PROPOFOL 500 MG/50ML IV EMUL
INTRAVENOUS | Status: AC
  Filled 2020-10-15: qty 50

## 2020-10-15 MED ORDER — OXYCODONE HCL 5 MG/5ML PO SOLN: 5.0000 mg | Freq: Once | ORAL | Status: AC | PRN

## 2020-10-15 MED ORDER — OXYCODONE-ACETAMINOPHEN 5-325 MG PO TABS: 1.0000 | ORAL_TABLET | ORAL | 0 refills | Status: DC | PRN

## 2020-10-15 MED ORDER — MIDAZOLAM HCL 2 MG/2ML IJ SOLN
INTRAMUSCULAR | Status: AC
  Filled 2020-10-15: qty 2

## 2020-10-15 SURGICAL SUPPLY — 39 items
BLADE SURG 15 STRL LF DISP TIS (BLADE) ×1 IMPLANT
BLADE SURG 15 STRL SS (BLADE) ×3
BNDG CMPR 9X4 STRL LF SNTH (GAUZE/BANDAGES/DRESSINGS)
BNDG COHESIVE 4X5 TAN STRL (GAUZE/BANDAGES/DRESSINGS) IMPLANT
BNDG COHESIVE 6X5 TAN STRL LF (GAUZE/BANDAGES/DRESSINGS) IMPLANT
BNDG ESMARK 4X9 LF (GAUZE/BANDAGES/DRESSINGS) IMPLANT
BNDG GAUZE ELAST 4 BULKY (GAUZE/BANDAGES/DRESSINGS) IMPLANT
COVER WAND RF STERILE (DRAPES) IMPLANT
DISSECTOR 4.0MM X 13CM (MISCELLANEOUS) ×3 IMPLANT
DRAPE ARTHROSCOPY W/POUCH 90 (DRAPES) ×3 IMPLANT
DRAPE OEC MINIVIEW 54X84 (DRAPES) IMPLANT
DRAPE U-SHAPE 47X51 STRL (DRAPES) ×3 IMPLANT
DRSG EMULSION OIL 3X3 NADH (GAUZE/BANDAGES/DRESSINGS) ×3 IMPLANT
DURAPREP 26ML APPLICATOR (WOUND CARE) ×3 IMPLANT
ELECT REM PT RETURN 9FT ADLT (ELECTROSURGICAL) ×3
ELECTRODE REM PT RTRN 9FT ADLT (ELECTROSURGICAL) ×1 IMPLANT
EXCALIBUR 3.8MM X 13CM (MISCELLANEOUS) ×3 IMPLANT
GAUZE SPONGE 4X4 12PLY STRL (GAUZE/BANDAGES/DRESSINGS) ×3 IMPLANT
GLOVE SURG ORTHO LTX SZ9 (GLOVE) ×3 IMPLANT
GLOVE SURG UNDER POLY LF SZ9 (GLOVE) ×3 IMPLANT
GOWN STRL REUS W/ TWL LRG LVL3 (GOWN DISPOSABLE) ×1 IMPLANT
GOWN STRL REUS W/ TWL XL LVL3 (GOWN DISPOSABLE) ×1 IMPLANT
GOWN STRL REUS W/TWL LRG LVL3 (GOWN DISPOSABLE) ×3
GOWN STRL REUS W/TWL XL LVL3 (GOWN DISPOSABLE) ×3
MANIFOLD NEPTUNE II (INSTRUMENTS) ×3 IMPLANT
PACK ARTHROSCOPY DSU (CUSTOM PROCEDURE TRAY) ×3 IMPLANT
PACK BASIN DAY SURGERY FS (CUSTOM PROCEDURE TRAY) ×3 IMPLANT
PENCIL SMOKE EVAC W/HOLSTER (ELECTROSURGICAL) ×3 IMPLANT
PORT APPOLLO RF 90DEGREE MULTI (SURGICAL WAND) IMPLANT
PROBE APOLLO 90XL (SURGICAL WAND) ×3 IMPLANT
SLEEVE SCD COMPRESS KNEE MED (STOCKING) ×3 IMPLANT
STRAP ANKLE FOOT DISTRACTOR (ORTHOPEDIC SUPPLIES) IMPLANT
SUT ETHILON 2 0 FSLX (SUTURE) ×3 IMPLANT
SUT FIBERWIRE 2-0 18 17.9 3/8 (SUTURE) ×6
SUTURE FIBERWR 2-0 18 17.9 3/8 (SUTURE) ×2 IMPLANT
SYR 5ML LL (SYRINGE) IMPLANT
TOWEL GREEN STERILE FF (TOWEL DISPOSABLE) ×3 IMPLANT
TUBING ARTHROSCOPY IRRIG 16FT (MISCELLANEOUS) ×3 IMPLANT
WATER STERILE IRR 1000ML POUR (IV SOLUTION) IMPLANT

## 2020-10-15 NOTE — Anesthesia Procedure Notes (Signed)
Anesthesia Regional Block: Popliteal block   Pre-Anesthetic Checklist: ,, timeout performed, Correct Patient, Correct Site, Correct Laterality, Correct Procedure, Correct Position, site marked, Risks and benefits discussed,  Surgical consent,  Pre-op evaluation,  At surgeon's request and post-op pain management  Laterality: Right  Prep: chloraprep       Needles:  Injection technique: Single-shot  Needle Type: Stimiplex     Needle Length: 9cm  Needle Gauge: 21     Additional Needles:   Procedures:,,,, ultrasound used (permanent image in chart),,,,  Narrative:  Start time: 10/15/2020 8:46 AM End time: 10/15/2020 8:51 AM Injection made incrementally with aspirations every 5 mL.  Performed by: Personally  Anesthesiologist: Lynda Rainwater, MD

## 2020-10-15 NOTE — Interval H&P Note (Signed)
History and Physical Interval Note:  10/15/2020 9:08 AM  Taylor [name]  has presented today for surgery, with the diagnosis of Lateral Ankle Laxity and Impingement Right Ankle.  The various methods of treatment have been discussed with the patient and family. After consideration of risks, benefits and other options for treatment, the patient has consented to  Procedure(s): RIGHT ANKLE LIGAMENT RECONSTRUCTION AND ARTHROSCOPIC DEBRIDEMENT (Right) as a surgical intervention.  The patient's history has been reviewed, patient examined, no change in status, stable for surgery.  I have reviewed the patient's chart and labs.  Questions were answered to the patient's satisfaction.     Newt Minion

## 2020-10-15 NOTE — Op Note (Signed)
10/15/2020  10:33 AM  PATIENT:  Taylor [name]    PRE-OPERATIVE DIAGNOSIS:  Lateral Ankle Laxity and Impingement Right Ankle  POST-OPERATIVE DIAGNOSIS:  Same  PROCEDURE:  RIGHT ANKLE LIGAMENT RECONSTRUCTION AND ARTHROSCOPIC DEBRIDEMENT  SURGEON:  Newt Minion, MD  PHYSICIAN ASSISTANT:None ANESTHESIA:   General  PREOPERATIVE INDICATIONS:  Taylor [name] is a  30 y.o. female with a diagnosis of Lateral Ankle Laxity and Impingement Right Ankle who failed conservative measures and elected for surgical management.    The risks benefits and alternatives were discussed with the patient preoperatively including but not limited to the risks of infection, bleeding, nerve injury, cardiopulmonary complications, the need for revision surgery, among others, and the patient was willing to proceed.  OPERATIVE IMPLANTS: 2-0 FiberWire to repair the anterior talofibular ligament  @ENCIMAGES @  OPERATIVE FINDINGS: Arthroscopic findings showed significant impingement synovitis this was debrided the articular cartilage was intact  OPERATIVE PROCEDURE: Patient was brought the operating room and underwent a general anesthetic.  After adequate levels anesthesia were obtained patient's right lower extremity was prepped using DuraPrep draped into a sterile field a timeout was called.  A curvilinear incision was made over the anterior inferior aspect of the fibula.  There was significant amount of adipose tissue that was resected.  A football shaped ellipse was removed from the anterior talofibular ligament.  Using 2-0 FiberWire the anterior talofibular ligament was tightened and reconstructed.  The anterior drawer was stable after reconstruction.  The skin was closed using 2-0 nylon and attention was focused on the ankle.  A 18-gauge needle was used to localize the anterior medial anterior lateral portal.  Skin was incised and blunt dissection was carried down to the capsule and blunt trocar was used inserted  into the joint.  Visualization from the anterior medial portal shows significant amount of synovitis.  The electrical wand and shaver were used to debride the synovitis from the medial lateral gutters and anteriorly.  The articular cartilage was intact with no osteochondral defects.  A survey of all compartments was then again performed and there were no loose bodies no further impingement.  The instruments were removed portals were closed using 2-0 nylon a sterile dressing was applied patient was extubated taken the PACU in stable condition   DISCHARGE PLANNING:  Antibiotic duration: Preoperative antibiotics  Weightbearing: Touchdown weightbearing on the right  Pain medication: Prescription for Percocet  Dressing care/ Wound VAC: Change dressing in 3 to 5 days  Ambulatory devices: Crutches and a cam walker  Discharge to: Home.  Follow-up: In the office 1 week post operative.

## 2020-10-15 NOTE — Progress Notes (Signed)
Assisted Dr. Sabra Heck with right, ultrasound guided, popliteal block. Side rails up, monitors on throughout procedure. See vital signs in flow sheet. Tolerated Procedure well.

## 2020-10-15 NOTE — Anesthesia Preprocedure Evaluation (Signed)
Anesthesia Evaluation  Patient identified by MRN, date of birth, ID band Patient awake    Reviewed: Allergy & Precautions, NPO status , Patient's Chart, lab work & pertinent test results  History of Anesthesia Complications Negative for: history of anesthetic complications  Airway Mallampati: II  TM Distance: >3 FB Neck ROM: Full    Dental  (+) Poor Dentition, Chipped, Missing, Dental Advisory Given   Pulmonary sleep apnea (does not use CPAP) , Current Smoker and Patient abstained from smoking.,  10/23/2019 SARS coronavirus NEG   Pulmonary exam normal breath sounds clear to auscultation       Cardiovascular Normal cardiovascular exam+ Valvular Problems/Murmurs (bicuspid aortic valve)  Rhythm:Regular Rate:Normal  '20 ECHO: EF 55-60%, bicuspid aortic valve with out stenosis   Neuro/Psych  Headaches, Anxiety Depression    GI/Hepatic Neg liver ROS, GERD  Controlled and Medicated,  Endo/Other  Morbid obesity  Renal/GU negative Renal ROS     Musculoskeletal  (+) Arthritis , Osteoarthritis,    Abdominal (+) + obese,   Peds  Hematology negative hematology ROS (+)   Anesthesia Other Findings   Reproductive/Obstetrics                             Anesthesia Physical  Anesthesia Plan  ASA: III  Anesthesia Plan: General   Post-op Pain Management:  Regional for Post-op pain   Induction: Intravenous  PONV Risk Score and Plan: 2 and Ondansetron, Midazolam and Treatment may vary due to age or medical condition  Airway Management Planned: LMA  Additional Equipment: None  Intra-op Plan:   Post-operative Plan: Extubation in OR  Informed Consent: I have reviewed the patients History and Physical, chart, labs and discussed the procedure including the risks, benefits and alternatives for the proposed anesthesia with the patient or authorized representative who has indicated his/her understanding and  acceptance.     Dental advisory given  Plan Discussed with: CRNA and Surgeon  Anesthesia Plan Comments: (PAT note by Karoline Caldwell, PA-C: Follows with hematology for history of leukocytosis.  Last seen 10/10/2019 and at that time her CBC had normalized and work-up thus far has been benign.  Per note, next step would be a bone marrow biopsy but that was not felt necessary at this time due to normalization of her CBC.  History of bicuspid aortic valve.  She was seen by cardiology in May 2020 for evaluation.  Echo done 10/11/2018 showed normal LVEF 55-60%, bicuspid aortic valve without stenosis.  She also wore a 7-day event monitor for history of palpitations which showed only rare atrial and ventricular ectopy.  There were no specific arrhythmias noted.  No further work-up recommended.  Preop labs reviewed, unremarkable.  Event monitor 11/01/2018: 7 day event recorder reviewed. Sinus rhythm is present throughout with heart rate ranging from 61 bpm up to 134 bpm and average heart rate 91 bpm. There was only rare (<1%) atrial and ventricular ectopy and a single ventricular couplet. No sustained arrhythmias or pauses.  TTE 10/11/2018: 1. The left ventricle has normal systolic function, with an ejection  fraction of 55-60%. The cavity size was normal. There is mild concentric  left ventricular hypertrophy. Left ventricular diastolic parameters were  normal. No evidence of left  ventricular regional wall motion abnormalities.  2. The right ventricle has normal systolic function. The cavity was  normal. There is no increase in right ventricular wall thickness.  3. Left atrial size was mildly dilated.  4.  The tricuspid valve is grossly normal.  5. The aortic valve is bicuspid. Mild thickening of the aortic valve. No  stenosis.  6. The aortic root is normal in size and structure.  )        Anesthesia Quick Evaluation
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
## 2020-10-15 NOTE — Anesthesia Procedure Notes (Signed)
Procedure Name: LMA Insertion Date/Time: 10/15/2020 9:33 AM Performed by: Maryella Shivers, CRNA Pre-anesthesia Checklist: Patient identified, Emergency Drugs available, Suction available and Patient being monitored Patient Re-evaluated:Patient Re-evaluated prior to induction Oxygen Delivery Method: Circle system utilized Preoxygenation: Pre-oxygenation with 100% oxygen Induction Type: IV induction Ventilation: Mask ventilation without difficulty LMA: LMA inserted LMA Size: 4.0 Number of attempts: 1 Airway Equipment and Method: Bite block Placement Confirmation: positive ETCO2 Tube secured with: Tape Dental Injury: Teeth and Oropharynx as per pre-operative assessment

## 2020-10-15 NOTE — Discharge Instructions (Signed)

## 2020-10-15 NOTE — Transfer of Care (Signed)
Immediate Anesthesia Transfer of Care Note  Patient: Taylor [name]  Procedure(s) Performed: RIGHT ANKLE LIGAMENT RECONSTRUCTION AND ARTHROSCOPIC DEBRIDEMENT (Right Ankle)  Patient Location: PACU  Anesthesia Type:GA combined with regional for post-op pain  Level of Consciousness: awake, alert  and oriented  Airway & Oxygen Therapy: Patient Spontanous Breathing and Patient connected to face mask oxygen  Post-op Assessment: Report given to RN and Post -op Vital signs reviewed and stable  Post vital signs: Reviewed and stable  Last Vitals:  Vitals Value Taken Time  BP    Temp    Pulse 86 10/15/20 1036  Resp 13 10/15/20 1036  SpO2 100 % 10/15/20 1036  Vitals shown include unvalidated device data.  Last Pain:  Vitals:   10/15/20 0820  TempSrc: Oral  PainSc: 0-No pain         Complications: No complications documented.

## 2020-10-15 NOTE — Anesthesia Postprocedure Evaluation (Signed)
Anesthesia Post Note  Patient: Taylor [name]  Procedure(s) Performed: RIGHT ANKLE LIGAMENT RECONSTRUCTION AND ARTHROSCOPIC DEBRIDEMENT (Right Ankle)     Patient location during evaluation: PACU Anesthesia Type: General Level of consciousness: awake and alert Pain management: pain level controlled Vital Signs Assessment: post-procedure vital signs reviewed and stable Respiratory status: spontaneous breathing, nonlabored ventilation and respiratory function stable Cardiovascular status: blood pressure returned to baseline and stable Postop Assessment: no apparent nausea or vomiting Anesthetic complications: no   No complications documented.  Last Vitals:  Vitals:   10/15/20 1100 10/15/20 1131  BP: 121/78 117/75  Pulse: 87 96  Resp: 14 17  Temp:  36.7 C  SpO2: 95% 95%    Last Pain:  Vitals:   10/15/20 1126  TempSrc:   PainSc: [facility] Betzer

## 2020-10-17 ENCOUNTER — Encounter (HOSPITAL_BASED_OUTPATIENT_CLINIC_OR_DEPARTMENT_OTHER): Payer: Self-pay | Admitting: Orthopedic Surgery

## 2020-10-18 ENCOUNTER — Other Ambulatory Visit: Payer: Self-pay | Admitting: Physician Assistant

## 2020-10-18 ENCOUNTER — Telehealth: Payer: Self-pay

## 2020-10-18 MED ORDER — OXYCODONE-ACETAMINOPHEN 5-325 MG PO TABS: 1.0000 | ORAL_TABLET | ORAL | 0 refills | Status: DC | PRN

## 2020-10-18 NOTE — Telephone Encounter (Signed)
Pt called and would like a refill on her pain meds she doesn't  want to run out over the weekend

## 2020-10-18 NOTE — Telephone Encounter (Signed)
Called and sw pt to advise of below. Will call with questions. Has appt for follow up next week.

## 2020-10-18 NOTE — Telephone Encounter (Signed)
Done but she may not be able to pick up rx until Sunday

## 2020-10-18 NOTE — Telephone Encounter (Signed)
Pt has surgery 10/15/20 right ankle and is asking for refill on pain medication was given # 20 at time of surgery but is afraid she will run out over the weekend.

## 2020-10-23 ENCOUNTER — Ambulatory Visit (HOSPITAL_COMMUNITY)
Admission: RE | Admit: 2020-10-23 | Discharge: 2020-10-23 | Disposition: A | Discharge status: Home / Self Care | Payer: Medicaid Other | Source: Ambulatory Visit | Attending: Family | Admitting: Family

## 2020-10-23 ENCOUNTER — Other Ambulatory Visit: Payer: Self-pay

## 2020-10-23 ENCOUNTER — Encounter: Payer: Self-pay | Admitting: Physician Assistant

## 2020-10-23 ENCOUNTER — Ambulatory Visit (INDEPENDENT_AMBULATORY_CARE_PROVIDER_SITE_OTHER): Payer: Medicaid Other | Admitting: Physician Assistant

## 2020-10-23 DIAGNOSIS — R29898 Other symptoms and signs involving the musculoskeletal system: Secondary | ICD-10-CM | POA: Diagnosis not present

## 2020-10-23 DIAGNOSIS — R531 Weakness: Secondary | ICD-10-CM | POA: Diagnosis not present

## 2020-10-23 DIAGNOSIS — R202 Paresthesia of skin: Secondary | ICD-10-CM | POA: Diagnosis not present

## 2020-10-23 DIAGNOSIS — G43009 Migraine without aura, not intractable, without status migrainosus: Secondary | ICD-10-CM | POA: Diagnosis not present

## 2020-10-23 DIAGNOSIS — G43909 Migraine, unspecified, not intractable, without status migrainosus: Secondary | ICD-10-CM | POA: Diagnosis not present

## 2020-10-23 DIAGNOSIS — M79672 Pain in left foot: Secondary | ICD-10-CM
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
## 2020-10-24 ENCOUNTER — Telehealth: Payer: Self-pay

## 2020-10-24 NOTE — Telephone Encounter (Signed)
Patient called she is requesting a rx refill for oxycodone call back:[phone]

## 2020-10-25 ENCOUNTER — Telehealth: Payer: Self-pay

## 2020-10-25 ENCOUNTER — Other Ambulatory Visit: Payer: Self-pay | Admitting: Physician Assistant

## 2020-10-25 MED ORDER — OXYCODONE-ACETAMINOPHEN 5-325 MG PO TABS: 1.0000 | ORAL_TABLET | ORAL | 0 refills | Status: DC | PRN

## 2020-10-25 NOTE — Telephone Encounter (Signed)
This pt is s/p a right ankle ligament recon 10/15/20 asking for refill on pain medication please advise.

## 2020-10-25 NOTE — Telephone Encounter (Signed)
Patient called regarding previous message regarding a rx refill , patient also stated she took the bandage off of her foot and there was a spot of blood about dime sized,she wanted to inform mary anne incase she needs to come in call [phone]

## 2020-10-25 NOTE — Telephone Encounter (Signed)
done

## 2020-10-25 NOTE — Telephone Encounter (Signed)
I called pt and advised that she should keep the area clean and change the dressing daily. Continue to try and work with fx boot and elevate. She denies any s/s of infection just a small sized spot amount of drainage from incision. Will keep appt on Wednesday and call with any other questions.

## 2020-10-30 ENCOUNTER — Encounter: Payer: Self-pay | Admitting: Physician Assistant

## 2020-10-30 ENCOUNTER — Ambulatory Visit (INDEPENDENT_AMBULATORY_CARE_PROVIDER_SITE_OTHER): Payer: Medicaid Other | Admitting: Physician Assistant

## 2020-10-30 DIAGNOSIS — M24271 Disorder of ligament, right ankle: Secondary | ICD-10-CM

## 2020-10-30 MED ORDER — OXYCODONE-ACETAMINOPHEN 5-325 MG PO TABS: 1.0000 | ORAL_TABLET | ORAL | 0 refills | Status: DC | PRN
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
## 2020-11-04 DIAGNOSIS — M5136 Other intervertebral disc degeneration, lumbar region: Secondary | ICD-10-CM | POA: Diagnosis not present

## 2020-11-06 DIAGNOSIS — M79671 Pain in right foot: Secondary | ICD-10-CM | POA: Diagnosis not present

## 2020-11-06 DIAGNOSIS — M545 Low back pain, unspecified: Secondary | ICD-10-CM | POA: Diagnosis not present

## 2020-11-06 DIAGNOSIS — M461 Sacroiliitis, not elsewhere classified: Secondary | ICD-10-CM | POA: Diagnosis not present

## 2020-11-06 DIAGNOSIS — M79604 Pain in right leg: Secondary | ICD-10-CM | POA: Diagnosis not present

## 2020-11-06 DIAGNOSIS — G5603 Carpal tunnel syndrome, bilateral upper limbs: Secondary | ICD-10-CM | POA: Diagnosis not present

## 2020-11-07 ENCOUNTER — Ambulatory Visit (HOSPITAL_COMMUNITY): Payer: Medicaid Other | Attending: Physician Assistant | Admitting: Physical Therapy

## 2020-11-07 ENCOUNTER — Other Ambulatory Visit: Payer: Self-pay

## 2020-11-07 ENCOUNTER — Encounter (HOSPITAL_COMMUNITY): Payer: Self-pay | Admitting: Physical Therapy

## 2020-11-07 DIAGNOSIS — M25571 Pain in right ankle and joints of right foot: Secondary | ICD-10-CM | POA: Diagnosis not present

## 2020-11-07 DIAGNOSIS — R262 Difficulty in walking, not elsewhere classified: Secondary | ICD-10-CM | POA: Diagnosis not present
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
## 2020-11-07 NOTE — Patient Instructions (Signed)
Access Code: [email or web address] URL: [email or web address] Date: 11/07/2020 Prepared by: Josue Hector  Exercises Long Sitting Calf Stretch with Strap - 2-3 x daily - 7 x weekly - 1 sets - 4 reps - 30 seconds hold Seated Ankle Circles - 2-3 x daily - 7 x weekly - 3 sets - 10 reps Ankle Dorsiflexion with Resistance - 2-3 x daily - 7 x weekly - 3 sets - 10 reps Ankle Eversion with Resistance - 2-3 x daily - 7 x weekly - 3 sets - 10 reps Ankle Inversion with Resistance - 2-3 x daily - 7 x weekly - 3 sets - 10 reps Ankle and Toe Plantarflexion with Resistance - 2-3 x daily - 7 x weekly - 3 sets - 10 reps

## 2020-11-08 ENCOUNTER — Other Ambulatory Visit: Payer: Self-pay | Admitting: Family

## 2020-11-08 DIAGNOSIS — F411 Generalized anxiety disorder: Secondary | ICD-10-CM

## 2020-11-08 DIAGNOSIS — F321 Major depressive disorder, single episode, moderate: Secondary | ICD-10-CM

## 2020-11-13 ENCOUNTER — Ambulatory Visit (HOSPITAL_COMMUNITY): Payer: Medicaid Other | Attending: Physician Assistant | Admitting: Physical Therapy

## 2020-11-13 ENCOUNTER — Encounter (HOSPITAL_COMMUNITY): Payer: Self-pay | Admitting: Physical Therapy

## 2020-11-13 ENCOUNTER — Other Ambulatory Visit: Payer: Self-pay

## 2020-11-13 DIAGNOSIS — R262 Difficulty in walking, not elsewhere classified: Secondary | ICD-10-CM | POA: Diagnosis not present

## 2020-11-13 DIAGNOSIS — M25571 Pain in right ankle and joints of right foot: Secondary | ICD-10-CM | POA: Insufficient documentation
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
## 2020-11-14 ENCOUNTER — Telehealth: Payer: Self-pay | Admitting: Family

## 2020-11-14 ENCOUNTER — Encounter (HOSPITAL_COMMUNITY): Payer: Self-pay | Admitting: Physical Therapy

## 2020-11-14 ENCOUNTER — Ambulatory Visit (HOSPITAL_COMMUNITY): Payer: Medicaid Other | Admitting: Physical Therapy

## 2020-11-14 DIAGNOSIS — R262 Difficulty in walking, not elsewhere classified: Secondary | ICD-10-CM | POA: Diagnosis not present

## 2020-11-14 DIAGNOSIS — M25571 Pain in right ankle and joints of right foot: Secondary | ICD-10-CM
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
## 2020-11-14 NOTE — Patient Instructions (Signed)
Access Code: [email or web address] URL: [email or web address] Date: 11/14/2020 Prepared by: Mitzi Hansen Lovett Coffin  Exercises Heel rises with counter support - 1 x daily - 7 x weekly - 2 sets - 10 reps Tandem Stance - 1 x daily - 7 x weekly - 3 reps - 30 second hold

## 2020-11-14 NOTE — Telephone Encounter (Signed)
Taylor [place], do you know anything about this?

## 2020-11-14 NOTE — Telephone Encounter (Signed)
Pt called stating that she dropped her disability paperwork off a week or two ago and said someone called her and left her a message regarding it. Said they mentioned we wouldn't be able to do it or that there was something wrong with the paperwork.  Pt called to get clarification about it.  Please call patient at [phone]

## 2020-11-15 ENCOUNTER — Ambulatory Visit: Payer: Medicaid Other | Admitting: Nurse Practitioner

## 2020-11-15 ENCOUNTER — Other Ambulatory Visit: Payer: Self-pay

## 2020-11-15 ENCOUNTER — Encounter: Payer: Self-pay | Admitting: Nurse Practitioner

## 2020-11-15 VITALS — BP 139/87 | HR 80 | Temp 97.1°F | Ht 64.0 in | Wt 246.0 lb

## 2020-11-15 DIAGNOSIS — R152 Fecal urgency: Secondary | ICD-10-CM

## 2020-11-15 DIAGNOSIS — R159 Full incontinence of feces: Secondary | ICD-10-CM | POA: Diagnosis not present

## 2020-11-15 MED ORDER — PEPTO-BISMOL 524 MG/30ML PO SUSP: 30.0000 mL | Freq: Four times a day (QID) | ORAL | 0 refills | Status: DC | PRN

## 2020-11-15 NOTE — Assessment & Plan Note (Signed)
Symptoms of incontinence last 24 patient.  Patient reports having incontinence for about a year and a half but in the last 2 weeks she has started developing fecal urgency where she is unable to hold her bowels.  After assessment education provided to patient referral to GI completed, antimotility medication ordered.  Advised patient to avoid dairy products, greasy foods, and any irritants to the GI system.  Eat bulk forming foods, breads rice, bananas and applesauce.  Patient verbalized understanding.  Follow-up with worsening or unresolved symptoms.
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
## 2020-11-15 NOTE — Patient Instructions (Signed)
Fecal Incontinence Fecal incontinence, also called accidental bowel leakage, is not being able to control your bowels. This condition happens because the nerves or muscles around the anus do not work the way they should. This affects their ability to hold stool (feces). What are the causes? This condition may be caused by: Damage to the muscles at the end of the rectum (sphincter). Damage to the nerves that control bowel movements. Diarrhea. Chronic constipation. Pelvic floor dysfunction. This means the muscles in the pelvis do not work well. Loss of bowel storage capacity. This occurs when the rectum can no longer stretch in size in order to store feces. Inflammatory bowel disease (IBD), such as Crohn's disease. Irritable bowel syndrome (IBS). What increases the risk? You are more likely to develop this condition if you: Were born with bowels or a pelvis that did not form correctly. Have had rectal surgery. Have had radiation treatment for certain cancers. Have been pregnant, had a vaginal delivery, or had surgery that damaged the pelvic floor muscles. Had a complicated childbirth, spinal cord injury, or other trauma that caused nerve damage. Have a condition that can affect nerve function, such as diabetes, Parkinson's disease, or multiple sclerosis. Have a condition where the rectum drops down into the anus or vagina (prolapse). Are 75 years of age or older. What are the signs or symptoms? The main symptom of this condition is not being able to control your bowels.You also might not be able to get to the bathroom before a bowel movement. How is this diagnosed? This condition is diagnosed with a medical history and physical exam. You may also have other tests, including: Blood tests. Urine tests. A rectal exam. Ultrasound. MRI. Colonoscopy. This is an exam that looks at your large intestine (colon). Anal manometry. This is a test that measures the strength of the anal  sphincter. Anal electromyogram (EMG). This is a test that uses small electrodes to check for nerve damage. How is this treated? Treatment for this condition depends on the cause and severity. Treatment may also focus on addressing any underlying causes of this condition. Treatment may include: Medicines. This may include medicines to: Prevent diarrhea. Help with constipation (bulk-forming laxatives). Treat any underlying conditions. Biofeedback therapy. This can help to retrain muscles that are affected. Fiber supplements. These can help manage your bowel movements. Nerve stimulation. Injectable gel to promote tissue growth and better muscle control. Surgery. You may need: Sphincter repair surgery. Diversion surgery. This procedure lets feces pass out of your body through a hole in your abdomen. Follow these instructions at home: Eating and drinking  Follow instructions from your health care provider about any eating or drinking restrictions. Work with a dietitian to come up with a healthy diet that will help you avoid the foods that can make your condition worse. Keep a diet diary to find out which foods or drinks could be making your condition worse. Drink enough fluid to keep your urine pale yellow.  Lifestyle Do not use any products that contain nicotine or tobacco, such as cigarettes and e-cigarettes. If you need help quitting, ask your health care provider. This may help your condition. If you are overweight, talk with your health care provider about how to safely lose weight. This may help your condition. Increase your physical activity as told by your health care provider. This may help your condition. Always talk with your health care provider before starting a new exercise program. Carry a change of clothes and supplies to clean up  quickly if you have an episode of fetal incontinence. Consider joining a fecal incontinence support group. You can find a support group online or in  your local community. General instructions  Take over-the-counter and prescription medicines only as told by your health care provider. This includes any supplements. Apply a moisture barrier, such as petroleum jelly, to your rectum. This protects the skin from irritation caused by ongoing leaking or diarrhea. Tell your health care provider if you are upset or depressed about your condition. Keep all follow-up visits as told by your health care provider. This is important.  Where to find more information [employer] Functional Gastrointestinal Disorders: iffgd.[place] of Gastroenterology: patients.gi.org Contact a health care provider if: You have a fever. You have redness, swelling, or pain around your rectum. Your pain is getting worse or you lose feeling in your rectal area. You have blood in your stool. You feel sad or hopeless. You avoid social or work situations. Get help right away if: You stop having bowel movements. You cannot eat or drink without vomiting. You have rectal bleeding that does not stop. You have severe pain that is getting worse. You have symptoms of dehydration, including: Sleepiness or fatigue. Producing little or no urine, tears, or sweat. Dizziness. Dry mouth. Unusual irritability. Headache. Inability to think clearly. Summary Fecal incontinence, also called accidental bowel leakage, is not being able to control your bowels. This condition happens because the nerves or muscles around the anus do not work the way they should. Treatment varies depending on the cause and severity of your condition. Treatment may also focus on addressing any underlying causes of this condition. Follow instructions from your health care provider about any eating or drinking restrictions, lifestyle changes, and skin care. Take over-the-counter and prescription medicines only as told by your health care provider. This includes any  supplements. Tell your health care provider if your symptoms worsen or if you are upset or depressed about your condition. This information is not intended to replace advice given to you by your health care provider. Make sure you discuss any questions you have with your healthcare provider. Document Revised: 09/09/2017 Document Reviewed: 09/09/2017 Elsevier Patient Education  2022 [employer].

## 2020-11-15 NOTE — Telephone Encounter (Signed)
Called patient and let her know we do not fill out the "functional capacity" forms.  Will refund her payment of $20 cash.  Patient is coming in for appt today and will pick up the paperwork.

## 2020-11-19 ENCOUNTER — Encounter: Payer: Self-pay | Admitting: Internal Medicine

## 2020-11-19 ENCOUNTER — Encounter (HOSPITAL_COMMUNITY): Payer: Medicaid Other | Admitting: Physical Therapy

## 2020-11-21 ENCOUNTER — Other Ambulatory Visit (HOSPITAL_COMMUNITY): Payer: Self-pay | Admitting: Neurology

## 2020-11-21 ENCOUNTER — Ambulatory Visit (HOSPITAL_COMMUNITY): Payer: Medicaid Other | Admitting: Physical Therapy

## 2020-11-21 ENCOUNTER — Other Ambulatory Visit: Payer: Self-pay

## 2020-11-21 DIAGNOSIS — G5603 Carpal tunnel syndrome, bilateral upper limbs: Secondary | ICD-10-CM | POA: Diagnosis not present

## 2020-11-21 DIAGNOSIS — M79604 Pain in right leg: Secondary | ICD-10-CM | POA: Diagnosis not present

## 2020-11-21 DIAGNOSIS — M79672 Pain in left foot: Secondary | ICD-10-CM | POA: Diagnosis not present

## 2020-11-21 DIAGNOSIS — F341 Dysthymic disorder: Secondary | ICD-10-CM | POA: Diagnosis not present

## 2020-11-21 DIAGNOSIS — M5416 Radiculopathy, lumbar region: Secondary | ICD-10-CM | POA: Diagnosis not present

## 2020-11-21 DIAGNOSIS — M461 Sacroiliitis, not elsewhere classified: Secondary | ICD-10-CM | POA: Diagnosis not present

## 2020-11-21 DIAGNOSIS — J302 Other seasonal allergic rhinitis: Secondary | ICD-10-CM | POA: Diagnosis not present

## 2020-11-21 DIAGNOSIS — Q282 Arteriovenous malformation of cerebral vessels: Secondary | ICD-10-CM

## 2020-11-21 DIAGNOSIS — M25571 Pain in right ankle and joints of right foot: Secondary | ICD-10-CM

## 2020-11-21 DIAGNOSIS — G4733 Obstructive sleep apnea (adult) (pediatric): Secondary | ICD-10-CM | POA: Diagnosis not present

## 2020-11-21 DIAGNOSIS — R262 Difficulty in walking, not elsewhere classified: Secondary | ICD-10-CM | POA: Diagnosis not present

## 2020-11-21 DIAGNOSIS — G4700 Insomnia, unspecified: Secondary | ICD-10-CM | POA: Diagnosis not present

## 2020-11-21 DIAGNOSIS — K219 Gastro-esophageal reflux disease without esophagitis: Secondary | ICD-10-CM | POA: Diagnosis not present

## 2020-11-21 DIAGNOSIS — M545 Low back pain, unspecified: Secondary | ICD-10-CM | POA: Diagnosis not present

## 2020-11-21 DIAGNOSIS — Z79891 Long term (current) use of opiate analgesic: Secondary | ICD-10-CM | POA: Diagnosis not present
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
## 2020-11-27 ENCOUNTER — Ambulatory Visit (INDEPENDENT_AMBULATORY_CARE_PROVIDER_SITE_OTHER): Payer: Medicaid Other | Admitting: Physician Assistant

## 2020-11-27 ENCOUNTER — Encounter: Payer: Self-pay | Admitting: Physician Assistant

## 2020-11-27 DIAGNOSIS — M25871 Other specified joint disorders, right ankle and foot: Secondary | ICD-10-CM
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
## 2020-11-28 ENCOUNTER — Encounter: Payer: Self-pay | Admitting: Internal Medicine

## 2020-11-28 ENCOUNTER — Ambulatory Visit: Payer: Medicaid Other | Admitting: Internal Medicine

## 2020-11-28 ENCOUNTER — Other Ambulatory Visit: Payer: Self-pay

## 2020-11-28 VITALS — BP 143/100 | HR 100 | Temp 97.3°F | Ht 64.0 in | Wt 248.4 lb

## 2020-11-28 DIAGNOSIS — K219 Gastro-esophageal reflux disease without esophagitis: Secondary | ICD-10-CM

## 2020-11-28 DIAGNOSIS — R1319 Other dysphagia: Secondary | ICD-10-CM

## 2020-11-28 DIAGNOSIS — R140 Abdominal distension (gaseous): Secondary | ICD-10-CM | POA: Diagnosis not present

## 2020-11-28 DIAGNOSIS — R197 Diarrhea, unspecified: Secondary | ICD-10-CM | POA: Diagnosis not present

## 2020-11-28 DIAGNOSIS — R1310 Dysphagia, unspecified: Secondary | ICD-10-CM | POA: Insufficient documentation

## 2020-11-28 MED ORDER — CLENPIQ 10-3.5-12 MG-GM -GM/160ML PO SOLN: 1.0000 | Freq: Once | ORAL | 0 refills | Status: AC
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
## 2020-11-28 NOTE — Patient Instructions (Signed)
We will schedule you for colonoscopy to further evaluate your diarrhea and urgency.  Conditions I will be looking to rule out include underlying Crohn's disease, ulcerative colitis, microscopic colitis, polyps, diverticulitis, among others.  At the same time we will perform upper endoscopy with potential esophageal dilation to evaluate your difficulty swallowing as well as your chronic acid reflux.  I will check blood work today at [facility] labs to rule out underlying celiac disease.  I would recommend taking over-the-counter Imodium to see if this helps.  Further recommendations to follow.  At [facility] we value your feedback. You may receive a survey about your visit today. Please share your experience as we strive to create trusting relationships with our patients to provide genuine, compassionate, quality care.  We appreciate your understanding and patience as we review any laboratory studies, imaging, and other diagnostic tests that are ordered as we care for you. Our office policy is 5 business days for review of these results, and any emergent or urgent results are addressed in a timely manner for your best interest. If you do not hear from our office in 1 week, please contact us.   We also encourage the use of MyChart, which contains your medical information for your review as well. If you are not enrolled in this feature, an access code is on this after visit summary for your convenience. Thank you for allowing [place] to be involved in your care.  It was great to see you today!  I hope you have a great rest of your summer!!    Elon Alas. [name], D.O. Gastroenterology and Hepatology [facility]

## 2020-11-29 ENCOUNTER — Telehealth (HOSPITAL_COMMUNITY): Payer: Self-pay

## 2020-11-29 NOTE — Telephone Encounter (Signed)
S/w patient she understands Taylor [name] will be out of the office 12/20/20 and wanted to cx this appointment. She did not want to r/s at this time

## 2020-12-02 ENCOUNTER — Other Ambulatory Visit: Payer: Self-pay | Admitting: Family

## 2020-12-02 DIAGNOSIS — F411 Generalized anxiety disorder: Secondary | ICD-10-CM

## 2020-12-02 DIAGNOSIS — K219 Gastro-esophageal reflux disease without esophagitis: Secondary | ICD-10-CM

## 2020-12-02 DIAGNOSIS — F321 Major depressive disorder, single episode, moderate: Secondary | ICD-10-CM

## 2020-12-04 ENCOUNTER — Ambulatory Visit (HOSPITAL_COMMUNITY): Payer: Medicaid Other | Admitting: Physical Therapy

## 2020-12-06 ENCOUNTER — Ambulatory Visit (HOSPITAL_COMMUNITY): Payer: Medicaid Other

## 2020-12-06 ENCOUNTER — Other Ambulatory Visit: Payer: Self-pay

## 2020-12-06 DIAGNOSIS — M25571 Pain in right ankle and joints of right foot: Secondary | ICD-10-CM | POA: Diagnosis not present

## 2020-12-06 DIAGNOSIS — R262 Difficulty in walking, not elsewhere classified: Secondary | ICD-10-CM

## 2020-12-06 DIAGNOSIS — R197 Diarrhea, unspecified: Secondary | ICD-10-CM | POA: Diagnosis not present

## 2020-12-06 DIAGNOSIS — R140 Abdominal distension (gaseous): Secondary | ICD-10-CM | POA: Diagnosis not present
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
## 2020-12-09 DIAGNOSIS — K52832 Lymphocytic colitis: Secondary | ICD-10-CM

## 2020-12-09 HISTORY — DX: Lymphocytic colitis: K52.832

## 2020-12-09 LAB — IGA: Immunoglobulin A: 122 mg/dL (ref 47–310)

## 2020-12-09 LAB — TISSUE TRANSGLUTAMINASE ABS,IGG,IGA
(tTG) Ab, IgA: <1 U/mL
(tTG) Ab, IgG: <1 U/mL

## 2020-12-10 ENCOUNTER — Encounter (HOSPITAL_COMMUNITY): Payer: Self-pay | Admitting: Physical Therapy

## 2020-12-10 ENCOUNTER — Other Ambulatory Visit: Payer: Self-pay

## 2020-12-10 ENCOUNTER — Ambulatory Visit (HOSPITAL_COMMUNITY): Payer: Medicaid Other | Attending: Physician Assistant | Admitting: Physical Therapy

## 2020-12-10 DIAGNOSIS — M25571 Pain in right ankle and joints of right foot: Secondary | ICD-10-CM | POA: Diagnosis not present

## 2020-12-10 DIAGNOSIS — R262 Difficulty in walking, not elsewhere classified: Secondary | ICD-10-CM | POA: Diagnosis not present
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
## 2020-12-12 ENCOUNTER — Other Ambulatory Visit: Payer: Self-pay

## 2020-12-12 ENCOUNTER — Ambulatory Visit (HOSPITAL_COMMUNITY): Payer: Medicaid Other | Admitting: Physical Therapy

## 2020-12-12 DIAGNOSIS — R262 Difficulty in walking, not elsewhere classified: Secondary | ICD-10-CM | POA: Diagnosis not present

## 2020-12-12 DIAGNOSIS — M25571 Pain in right ankle and joints of right foot: Secondary | ICD-10-CM | POA: Diagnosis not present
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
## 2020-12-16 ENCOUNTER — Other Ambulatory Visit: Payer: Self-pay

## 2020-12-16 ENCOUNTER — Ambulatory Visit (HOSPITAL_COMMUNITY): Payer: Medicaid Other | Admitting: Physical Therapy

## 2020-12-16 DIAGNOSIS — R262 Difficulty in walking, not elsewhere classified: Secondary | ICD-10-CM | POA: Diagnosis not present

## 2020-12-16 DIAGNOSIS — M25571 Pain in right ankle and joints of right foot: Secondary | ICD-10-CM | POA: Diagnosis not present
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
## 2020-12-17 ENCOUNTER — Encounter (HOSPITAL_COMMUNITY): Payer: Medicaid Other | Admitting: Physical Therapy

## 2020-12-19 ENCOUNTER — Ambulatory Visit (HOSPITAL_COMMUNITY)
Admission: RE | Admit: 2020-12-19 | Discharge: 2020-12-19 | Disposition: A | Discharge status: Home / Self Care | Payer: Medicaid Other | Source: Ambulatory Visit | Attending: Neurology | Admitting: Neurology

## 2020-12-19 ENCOUNTER — Other Ambulatory Visit: Payer: Self-pay

## 2020-12-19 ENCOUNTER — Encounter (HOSPITAL_COMMUNITY): Payer: Self-pay

## 2020-12-19 DIAGNOSIS — Q282 Arteriovenous malformation of cerebral vessels: Secondary | ICD-10-CM | POA: Insufficient documentation

## 2020-12-19 MED ORDER — IOHEXOL 350 MG/ML SOLN
75.0000 mL | Freq: Once | INTRAVENOUS | Status: AC | PRN
  Administered 2020-12-19: 75 mL via INTRAVENOUS

## 2020-12-20 ENCOUNTER — Encounter (HOSPITAL_COMMUNITY): Payer: Medicaid Other

## 2020-12-21 ENCOUNTER — Encounter (HOSPITAL_COMMUNITY): Payer: Self-pay | Admitting: Emergency Medicine

## 2020-12-21 ENCOUNTER — Emergency Department (HOSPITAL_COMMUNITY): Payer: Medicaid Other

## 2020-12-21 ENCOUNTER — Emergency Department (HOSPITAL_COMMUNITY)
Admission: EM | Admit: 2020-12-21 | Discharge: 2020-12-21 | Disposition: A | Discharge status: Home / Self Care | Payer: Medicaid Other | Attending: Emergency Medicine | Admitting: Emergency Medicine

## 2020-12-21 ENCOUNTER — Other Ambulatory Visit: Payer: Self-pay

## 2020-12-21 DIAGNOSIS — M542 Cervicalgia: Secondary | ICD-10-CM | POA: Diagnosis not present

## 2020-12-21 DIAGNOSIS — S199XXA Unspecified injury of neck, initial encounter: Secondary | ICD-10-CM | POA: Diagnosis not present

## 2020-12-21 DIAGNOSIS — R200 Anesthesia of skin: Secondary | ICD-10-CM | POA: Diagnosis not present

## 2020-12-21 DIAGNOSIS — Y92410 Unspecified street and highway as the place of occurrence of the external cause: Secondary | ICD-10-CM | POA: Diagnosis not present

## 2020-12-21 DIAGNOSIS — M7918 Myalgia, other site: Secondary | ICD-10-CM

## 2020-12-21 DIAGNOSIS — F17210 Nicotine dependence, cigarettes, uncomplicated: Secondary | ICD-10-CM | POA: Diagnosis not present

## 2020-12-21 DIAGNOSIS — M25511 Pain in right shoulder: Secondary | ICD-10-CM | POA: Diagnosis not present

## 2020-12-21 DIAGNOSIS — S22009A Unspecified fracture of unspecified thoracic vertebra, initial encounter for closed fracture: Secondary | ICD-10-CM | POA: Diagnosis not present

## 2020-12-21 DIAGNOSIS — R519 Headache, unspecified: Secondary | ICD-10-CM | POA: Insufficient documentation

## 2020-12-21 DIAGNOSIS — M549 Dorsalgia, unspecified: Secondary | ICD-10-CM | POA: Diagnosis not present

## 2020-12-21 DIAGNOSIS — M25512 Pain in left shoulder: Secondary | ICD-10-CM | POA: Diagnosis not present

## 2020-12-21 DIAGNOSIS — M419 Scoliosis, unspecified: Secondary | ICD-10-CM | POA: Diagnosis not present

## 2020-12-21 DIAGNOSIS — R52 Pain, unspecified: Secondary | ICD-10-CM | POA: Diagnosis not present

## 2020-12-21 DIAGNOSIS — M546 Pain in thoracic spine: Secondary | ICD-10-CM | POA: Diagnosis not present

## 2020-12-21 DIAGNOSIS — S0990XA Unspecified injury of head, initial encounter: Secondary | ICD-10-CM | POA: Diagnosis not present

## 2020-12-21 DIAGNOSIS — R102 Pelvic and perineal pain: Secondary | ICD-10-CM | POA: Insufficient documentation

## 2020-12-21 LAB — I-STAT BETA HCG BLOOD, ED (MC, WL, AP ONLY): I-stat hCG, quantitative: <5 m[IU]/mL (ref <5)

## 2020-12-21 MED ORDER — NAPROXEN 250 MG PO TABS
500.0000 mg | ORAL_TABLET | Freq: Once | ORAL | Status: AC
  Administered 2020-12-21: 500 mg via ORAL
  Filled 2020-12-21: qty 2
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
## 2020-12-23 ENCOUNTER — Telehealth: Payer: Self-pay

## 2020-12-23 NOTE — Telephone Encounter (Signed)
Transition Care Management Follow-up Telephone Call Date of discharge and from where: 12/21/2020 from [facility] How have you been since you were released from the hospital? Pt stated that she is feeling okay just a little sore.  Any questions or concerns? No  Items Reviewed: Did the pt receive and understand the discharge instructions provided? Yes  Medications obtained and verified? No  Other? No  Any new allergies since your discharge? No  Dietary orders reviewed? No Do you have support at home? Yes   Functional Questionnaire: (I = Independent and D = Dependent) ADLs: I  Bathing/Dressing- I  Meal Prep- I  Eating- I  Maintaining continence- I  Transferring/Ambulation- I  Managing Meds- I   Follow up appointments reviewed:  PCP Hospital f/u appt confirmed? No  Specialist Hospital f/u appt confirmed? No   Are transportation arrangements needed? No  If their condition worsens, is the pt aware to call PCP or go to the Emergency Dept.? Yes Was the patient provided with contact information for the PCP's office or ED? Yes Was to pt encouraged to call back with questions or concerns? Yes

## 2020-12-25 ENCOUNTER — Other Ambulatory Visit: Payer: Self-pay | Admitting: Family

## 2020-12-25 DIAGNOSIS — K219 Gastro-esophageal reflux disease without esophagitis: Secondary | ICD-10-CM

## 2020-12-30 ENCOUNTER — Other Ambulatory Visit (HOSPITAL_COMMUNITY)
Admission: RE | Admit: 2020-12-30 | Discharge: 2020-12-30 | Disposition: A | Discharge status: Home / Self Care | Payer: Medicaid Other | Source: Ambulatory Visit | Attending: Internal Medicine | Admitting: Internal Medicine

## 2020-12-30 ENCOUNTER — Telehealth: Payer: Self-pay

## 2020-12-30 DIAGNOSIS — R1319 Other dysphagia: Secondary | ICD-10-CM | POA: Insufficient documentation

## 2020-12-30 DIAGNOSIS — K219 Gastro-esophageal reflux disease without esophagitis: Secondary | ICD-10-CM | POA: Insufficient documentation

## 2020-12-30 DIAGNOSIS — R197 Diarrhea, unspecified: Secondary | ICD-10-CM | POA: Insufficient documentation

## 2020-12-30 LAB — PREGNANCY, URINE: Preg Test, Ur: NEGATIVE

## 2020-12-30 NOTE — Telephone Encounter (Signed)
Melanie at [facility] tried to call pt to see if she can arrive earlier for procedure tomorrow. She also needs to have pregnancy test done today.  Called pt, she can arrive tomorrow at 8:15am for procedure. Advised her to go to [facility] lab today for pregnancy test. She didn't have prep instructions. Has access to MyChart. Advised her she will be on clear liquids all day today. Updated instructions sent to MyChart.  Melanie informed pt will arrive at 8:15am tomorrow.

## 2020-12-31 ENCOUNTER — Encounter (HOSPITAL_COMMUNITY): Payer: Self-pay

## 2020-12-31 ENCOUNTER — Ambulatory Visit (HOSPITAL_COMMUNITY)
Admission: RE | Admit: 2020-12-31 | Discharge: 2020-12-31 | Disposition: A | Discharge status: Home / Self Care | Payer: Medicaid Other | Source: Ambulatory Visit | Attending: Internal Medicine | Admitting: Internal Medicine

## 2020-12-31 ENCOUNTER — Other Ambulatory Visit: Payer: Self-pay

## 2020-12-31 ENCOUNTER — Ambulatory Visit (HOSPITAL_COMMUNITY): Payer: Medicaid Other | Admitting: Anesthesiology

## 2020-12-31 ENCOUNTER — Encounter (HOSPITAL_COMMUNITY)
Admission: RE | Disposition: A | Discharge status: Home / Self Care | Payer: Self-pay | Source: Ambulatory Visit | Attending: Internal Medicine

## 2020-12-31 DIAGNOSIS — K648 Other hemorrhoids: Secondary | ICD-10-CM | POA: Insufficient documentation

## 2020-12-31 DIAGNOSIS — R12 Heartburn: Secondary | ICD-10-CM | POA: Insufficient documentation

## 2020-12-31 DIAGNOSIS — K319 Disease of stomach and duodenum, unspecified: Secondary | ICD-10-CM | POA: Insufficient documentation

## 2020-12-31 DIAGNOSIS — K2970 Gastritis, unspecified, without bleeding: Secondary | ICD-10-CM | POA: Diagnosis not present

## 2020-12-31 DIAGNOSIS — K219 Gastro-esophageal reflux disease without esophagitis: Secondary | ICD-10-CM | POA: Insufficient documentation

## 2020-12-31 DIAGNOSIS — R1310 Dysphagia, unspecified: Secondary | ICD-10-CM | POA: Insufficient documentation

## 2020-12-31 DIAGNOSIS — R197 Diarrhea, unspecified: Secondary | ICD-10-CM | POA: Diagnosis not present

## 2020-12-31 DIAGNOSIS — F17210 Nicotine dependence, cigarettes, uncomplicated: Secondary | ICD-10-CM | POA: Diagnosis not present

## 2020-12-31 DIAGNOSIS — D175 Benign lipomatous neoplasm of intra-abdominal organs: Secondary | ICD-10-CM

## 2020-12-31 DIAGNOSIS — K31A19 Gastric intestinal metaplasia without dysplasia, unspecified site: Secondary | ICD-10-CM | POA: Insufficient documentation

## 2020-12-31 DIAGNOSIS — Z79899 Other long term (current) drug therapy: Secondary | ICD-10-CM | POA: Diagnosis not present

## 2020-12-31 DIAGNOSIS — K529 Noninfective gastroenteritis and colitis, unspecified: Secondary | ICD-10-CM | POA: Diagnosis not present

## 2020-12-31 DIAGNOSIS — D1779 Benign lipomatous neoplasm of other sites: Secondary | ICD-10-CM | POA: Diagnosis not present

## 2020-12-31 DIAGNOSIS — F418 Other specified anxiety disorders: Secondary | ICD-10-CM | POA: Diagnosis not present

## 2020-12-31 HISTORY — PX: COLONOSCOPY WITH PROPOFOL: SHX5780

## 2020-12-31 HISTORY — PX: BALLOON DILATION: SHX5330

## 2020-12-31 HISTORY — PX: BIOPSY: SHX5522

## 2020-12-31 HISTORY — PX: ESOPHAGOGASTRODUODENOSCOPY (EGD) WITH PROPOFOL: SHX5813

## 2020-12-31 SURGERY — COLONOSCOPY WITH PROPOFOL
Anesthesia: General

## 2020-12-31 MED ORDER — STERILE WATER FOR IRRIGATION IR SOLN
Status: DC | PRN
  Administered 2020-12-31: 300 mL

## 2020-12-31 MED ORDER — DICYCLOMINE HCL 10 MG PO CAPS: 10.0000 mg | ORAL_CAPSULE | Freq: Four times a day (QID) | ORAL | 5 refills | Status: DC

## 2020-12-31 MED ORDER — LIDOCAINE HCL (CARDIAC) PF 100 MG/5ML IV SOSY
PREFILLED_SYRINGE | INTRAVENOUS | Status: DC | PRN
  Administered 2020-12-31: 50 mg via INTRAVENOUS

## 2020-12-31 MED ORDER — PROPOFOL 10 MG/ML IV BOLUS
INTRAVENOUS | Status: DC | PRN
  Administered 2020-12-31: 50 mg via INTRAVENOUS
  Administered 2020-12-31: 100 mg via INTRAVENOUS
  Administered 2020-12-31: 50 mg via INTRAVENOUS

## 2020-12-31 MED ORDER — PROPOFOL 500 MG/50ML IV EMUL
INTRAVENOUS | Status: DC | PRN
  Administered 2020-12-31: 150 ug/kg/min via INTRAVENOUS

## 2020-12-31 MED ORDER — LACTATED RINGERS IV SOLN: INTRAVENOUS | Status: DC

## 2020-12-31 MED ORDER — OMEPRAZOLE 40 MG PO CPDR: 40.0000 mg | DELAYED_RELEASE_CAPSULE | Freq: Two times a day (BID) | ORAL | 5 refills | Status: DC

## 2020-12-31 NOTE — Anesthesia Preprocedure Evaluation (Addendum)
Anesthesia Evaluation  Patient identified by MRN, date of birth, ID band Patient awake    Reviewed: Allergy & Precautions, NPO status , Patient's Chart, lab work & pertinent test results  History of Anesthesia Complications (+) Family history of anesthesia reaction  Airway Mallampati: II  TM Distance: >3 FB     Dental  (+) Dental Advisory Given, Poor Dentition, Chipped, Missing   Pulmonary shortness of breath and with exertion, sleep apnea , Current Smoker and Patient abstained from smoking.,    Pulmonary exam normal breath sounds clear to auscultation       Cardiovascular Exercise Tolerance: Poor Normal cardiovascular exam+ dysrhythmias  Rhythm:Regular Rate:Normal     Neuro/Psych  Headaches, PSYCHIATRIC DISORDERS Anxiety Depression  Neuromuscular disease    GI/Hepatic Neg liver ROS, GERD  Medicated and Controlled,  Endo/Other  Morbid obesity  Renal/GU negative Renal ROS     Musculoskeletal  (+) Arthritis  (scoliosis),   Abdominal   Peds  Hematology  (+) anemia ,   Anesthesia Other Findings   Reproductive/Obstetrics negative OB ROS                            Anesthesia Physical Anesthesia Plan  ASA: 3  Anesthesia Plan: General   Post-op Pain Management:    Induction: Intravenous  PONV Risk Score and Plan: Propofol infusion  Airway Management Planned: Nasal Cannula and Natural Airway  Additional Equipment:   Intra-op Plan:   Post-operative Plan:   Informed Consent: I have reviewed the patients History and Physical, chart, labs and discussed the procedure including the risks, benefits and alternatives for the proposed anesthesia with the patient or authorized representative who has indicated his/her understanding and acceptance.     Dental advisory given  Plan Discussed with: CRNA and Surgeon  Anesthesia Plan Comments:         Anesthesia Quick Evaluation

## 2020-12-31 NOTE — Transfer of Care (Signed)
Immediate Anesthesia Transfer of Care Note  Patient: Taylor [name]  Procedure(s) Performed: COLONOSCOPY WITH PROPOFOL ESOPHAGOGASTRODUODENOSCOPY (EGD) WITH PROPOFOL BALLOON DILATION BIOPSY  Patient Location: Endoscopy Unit  Anesthesia Type:General  Level of Consciousness: awake, alert  and oriented  Airway & Oxygen Therapy: Patient Spontanous Breathing  Post-op Assessment: Report given to RN and Post -op Vital signs reviewed and stable  Post vital signs: Reviewed and stable  Last Vitals:  Vitals Value Taken Time  BP    Temp    Pulse    Resp    SpO2      Last Pain:  Vitals:   12/31/20 0907  PainSc: 0-No pain      Patients Stated Pain Goal: 4 ([phone] [id number])  Complications: No notable events documented.

## 2020-12-31 NOTE — Op Note (Signed)
[facility] Patient Name: Taylor [name] Procedure Date: 12/31/2020 9:22 AM MRN: [id number] Date of Birth: May 06, 1991 Attending MD: Elon Alas. [name] DO CSN: [id number] Age: 30 Admit Type: Outpatient Procedure:                Colonoscopy Indications:              Chronic diarrhea Providers:                Elon Alas. [name], DO, Lambert Mody, Dereck Leep, Technician Referring MD:              Medicines:                See the Anesthesia note for documentation of the                            administered medications Complications:            No immediate complications. Estimated Blood Loss:     Estimated blood loss was minimal. Procedure:                Pre-Anesthesia Assessment:                           - The anesthesia plan was to use monitored                            anesthesia care (MAC).                           After obtaining informed consent, the colonoscope                            was passed under direct vision. Throughout the                            procedure, the patient's blood pressure, pulse, and                            oxygen saturations were monitored continuously. The                            PCF-HQ190L ([id number]) scope was introduced through                            the anus and advanced to the the cecum, identified                            by appendiceal orifice and ileocecal valve. The                            colonoscopy was technically difficult and complex                            due to a redundant colon and significant looping.  Successful completion of the procedure was aided by                            applying abdominal pressure. The patient tolerated                            the procedure well. The quality of the bowel                            preparation was evaluated using the BBPS [facility]                            Bowel Preparation Scale) with  scores of: Right                            Colon = 3, Transverse Colon = 3 and Left Colon = 3                            (entire mucosa seen well with no residual staining,                            small fragments of stool or opaque liquid). The                            total BBPS score equals 9. Scope In: 9:24:11 AM Scope Out: 9:40:17 AM Scope Withdrawal Time: 0 hours 13 minutes 51 seconds  Total Procedure Duration: 0 hours 16 minutes 6 seconds  Findings:      The perianal and digital rectal examinations were normal.      Non-bleeding internal hemorrhoids were found during endoscopy.      There was a small lipoma, 15 mm in diameter, in the rectum. Biopsies       were taken with a cold forceps for histology with positive pillow sign.      Biopsies for histology were taken with a cold forceps from the ascending       colon, transverse colon and descending colon for evaluation of       microscopic colitis.      The exam was otherwise without abnormality. Impression:               - Non-bleeding internal hemorrhoids.                           - Small lipoma in the rectum. Biopsied.                           - The examination was otherwise normal.                           - Biopsies were taken with a cold forceps from the                            ascending colon, transverse colon and descending  colon for evaluation of microscopic colitis. Moderate Sedation:      Per Anesthesia Care Recommendation:           - Patient has a contact number available for                            emergencies. The signs and symptoms of potential                            delayed complications were discussed with the                            patient. Return to normal activities tomorrow.                            Written discharge instructions were provided to the                            patient.                           - Resume previous diet.                            - Continue present medications.                           - Await pathology results.                           - Repeat colonoscopy at age 2 or sooner if higher                            risk for screening purposes.                           - Return to GI clinic in 3 months.                           - Trial Dicyclomine 10 mg QID prn for                            diarhhea/abdominal pain Procedure Code(s):        --- Professional ---                           [id number], Colonoscopy, flexible; with biopsy, single                            or multiple Diagnosis Code(s):        --- Professional ---                           D17.5, Benign lipomatous neoplasm of                            intra-abdominal organs  K64.8, Other hemorrhoids                           K52.9, Noninfective gastroenteritis and colitis,                            unspecified CPT copyright 2019 American Medical Association. All rights reserved. The codes documented in this report are preliminary and upon coder review may  be revised to meet current compliance requirements. Elon Alas. [name], DO [place] [name], DO 12/31/2020 9:47:53 AM This report has been signed electronically. Number of Addenda: 0

## 2020-12-31 NOTE — Anesthesia Postprocedure Evaluation (Signed)
Anesthesia Post Note  Patient: Taylor [name]  Procedure(s) Performed: COLONOSCOPY WITH PROPOFOL ESOPHAGOGASTRODUODENOSCOPY (EGD) WITH PROPOFOL BALLOON DILATION BIOPSY  Patient location during evaluation: Endoscopy Anesthesia Type: General Level of consciousness: awake and alert and oriented Pain management: pain level controlled Vital Signs Assessment: post-procedure vital signs reviewed and stable Respiratory status: spontaneous breathing and respiratory function stable Cardiovascular status: blood pressure returned to baseline and stable Postop Assessment: no apparent nausea or vomiting Anesthetic complications: no   No notable events documented.   Last Vitals:  Vitals:   12/31/20 0832 12/31/20 0943  BP: 128/80 99/78  Pulse: 86   Resp: 19 20  Temp: 36.9 C 36.6 C  SpO2: 95% 99%    Last Pain:  Vitals:   12/31/20 0943  TempSrc: Oral  PainSc: 0-No pain                 Solomon Skowronek C Marcayla Budge

## 2020-12-31 NOTE — Discharge Instructions (Addendum)
EGD Discharge instructions Please read the instructions outlined below and refer to this sheet in the next few weeks. These discharge instructions provide you with general information on caring for yourself after you leave the hospital. Your doctor may also give you specific instructions. While your treatment has been planned according to the most current medical practices available, unavoidable complications occasionally occur. If you have any problems or questions after discharge, please call your doctor. ACTIVITY You may resume your regular activity but move at a slower pace for the next 24 hours.  Take frequent rest periods for the next 24 hours.  Walking will help expel (get rid of) the air and reduce the bloated feeling in your abdomen.  No driving for 24 hours (because of the anesthesia (medicine) used during the test).  You may shower.  Do not sign any important legal documents or operate any machinery for 24 hours (because of the anesthesia used during the test).  NUTRITION Drink plenty of fluids.  You may resume your normal diet.  Begin with a light meal and progress to your normal diet.  Avoid alcoholic beverages for 24 hours or as instructed by your caregiver.  MEDICATIONS You may resume your normal medications unless your caregiver tells you otherwise.  WHAT YOU CAN EXPECT TODAY You may experience abdominal discomfort such as a feeling of fullness or "gas" pains.  FOLLOW-UP Your doctor will discuss the results of your test with you.  SEEK IMMEDIATE MEDICAL ATTENTION IF ANY OF THE FOLLOWING OCCUR: Excessive nausea (feeling sick to your stomach) and/or vomiting.  Severe abdominal pain and distention (swelling).  Trouble swallowing.  Temperature over 101 F (37.8 C).  Rectal bleeding or vomiting of blood.     Colonoscopy Discharge Instructions  Read the instructions outlined below and refer to this sheet in the next few weeks. These discharge instructions provide you with  general information on caring for yourself after you leave the hospital. Your doctor may also give you specific instructions. While your treatment has been planned according to the most current medical practices available, unavoidable complications occasionally occur.   ACTIVITY You may resume your regular activity, but move at a slower pace for the next 24 hours.  Take frequent rest periods for the next 24 hours.  Walking will help get rid of the air and reduce the bloated feeling in your belly (abdomen).  No driving for 24 hours (because of the medicine (anesthesia) used during the test).   Do not sign any important legal documents or operate any machinery for 24 hours (because of the anesthesia used during the test).  NUTRITION Drink plenty of fluids.  You may resume your normal diet as instructed by your doctor.  Begin with a light meal and progress to your normal diet. Heavy or fried foods are harder to digest and may make you feel sick to your stomach (nauseated).  Avoid alcoholic beverages for 24 hours or as instructed.  MEDICATIONS You may resume your normal medications unless your doctor tells you otherwise.  WHAT YOU CAN EXPECT TODAY Some feelings of bloating in the abdomen.  Passage of more gas than usual.  Spotting of blood in your stool or on the toilet paper.  IF YOU HAD POLYPS REMOVED DURING THE COLONOSCOPY: No aspirin products for 7 days or as instructed.  No alcohol for 7 days or as instructed.  Eat a soft diet for the next 24 hours.  FINDING OUT THE RESULTS OF YOUR TEST Not all test results are  available during your visit. If your test results are not back during the visit, make an appointment with your caregiver to find out the results. Do not assume everything is normal if you have not heard from your caregiver or the medical facility. It is important for you to follow up on all of your test results.  SEEK IMMEDIATE MEDICAL ATTENTION IF: You have more than a spotting of  blood in your stool.  Your belly is swollen (abdominal distention).  You are nauseated or vomiting.  You have a temperature over 101.  You have abdominal pain or discomfort that is severe or gets worse throughout the day.   Your EGD revealed a mild amount inflammation in your stomach.  I took biopsies of this to rule out infection with a bacteria called H. pylori.  I also took biopsies of your esophagus to rule out a condition called eosinophilic esophagitis that can cause chronic difficulty swallowing.  I did stretch your esophagus with a 20 mm balloon.  Hopefully this helps with your swallowing.  Continue on omeprazole, I want you to increase to twice daily for the next 8 to 12 weeks and then you can decrease to back down to once daily.  Await pathology results, my office will contact you.  Your colonoscopy looked good.  I did not see any active inflammation indicative of underlying inflammatory bowel disease such as Crohn's disease or ulcerative colitis.  You did have a lipoma in your rectum which is a benign fatty polyp.  I took biopsies of this to make sure.  Recommend repeat colonoscopy at age 14 or sooner if family history of colon cancer.    I also took biopsies of your colon to rule out a condition called microscopic colitis which can cause chronic diarrhea especially in women.  I want you to start taking a medication called dicyclomine.  You can take this up to 4 times a day as needed for diarrhea and/or abdominal pain.  Follow-up in 3 months.  I hope you have a great rest of your week!  Elon Alas. [name], D.O. Gastroenterology and Hepatology [facility]
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
## 2020-12-31 NOTE — Op Note (Signed)
[facility] Patient Name: Athina Fahey Procedure Date: 12/31/2020 8:54 AM MRN: [id number] Date of Birth: 1990-06-07 Attending MD: Elon Alas. [name] DO CSN: [id number] Age: 30 Admit Type: Outpatient Procedure:                Upper GI endoscopy Indications:              Dysphagia, Heartburn Providers:                Elon Alas. [name], DO, Lambert Mody, Dereck Leep, Technician Referring MD:              Medicines:                See the Anesthesia note for documentation of the                            administered medications Complications:            No immediate complications. Estimated Blood Loss:     Estimated blood loss was minimal. Procedure:                Pre-Anesthesia Assessment:                           - The anesthesia plan was to use monitored                            anesthesia care (MAC).                           After obtaining informed consent, the endoscope was                            passed under direct vision. Throughout the                            procedure, the patient's blood pressure, pulse, and                            oxygen saturations were monitored continuously. The                            [id number]-H190 ([id number]) scope was introduced through the                            mouth, and advanced to the second part of duodenum.                            The upper GI endoscopy was accomplished without                            difficulty. The patient tolerated the procedure                            well. Scope In: 9:12:27 AM  Scope Out: 9:18:26 AM Total Procedure Duration: 0 hours 5 minutes 59 seconds  Findings:      The Z-line was regular and was found 39 cm from the incisors.      Biopsies were taken with a cold forceps in the middle third of the       esophagus for histology.      No endoscopic abnormality was evident in the esophagus to explain the       patient's complaint of dysphagia.  Preparations were made for empiric       dilation. A TTS dilator was passed through the scope. Dilation with an       18-19-20 mm balloon dilator was performed to 20 mm. Dilation was       performed with a mild resistance at 20 mm. Estimated blood loss was none.      Localized mild inflammation characterized by erythema was found in the       gastric antrum. Biopsies were taken with a cold forceps for Helicobacter       pylori testing.      The duodenal bulb, first portion of the duodenum and second portion of       the duodenum were normal. Impression:               - Z-line regular, 39 cm from the incisors.                           - Gastritis. Biopsied.                           - Normal duodenal bulb, first portion of the                            duodenum and second portion of the duodenum.                           - Biopsies were taken with a cold forceps for                            histology in the middle third of the esophagus. Moderate Sedation:      Per Anesthesia Care Recommendation:           - Patient has a contact number available for                            emergencies. The signs and symptoms of potential                            delayed complications were discussed with the                            patient. Return to normal activities tomorrow.                            Written discharge instructions were provided to the                            patient.                           -  Resume previous diet.                           - Continue present medications.                           - Await pathology results.                           - Repeat upper endoscopy PRN for retreatment.                           - Return to GI clinic in 3 months.                           - Use a proton pump inhibitor PO BID for 8-12 weeks                            then decrease back down to once daily.                           - Avoid NSAIDs as best as you can. Procedure  Code(s):        --- Professional ---                           [phone], Esophagogastroduodenoscopy, flexible,                            transoral; with biopsy, single or multiple Diagnosis Code(s):        --- Professional ---                           K29.70, Gastritis, unspecified, without bleeding                           R13.10, Dysphagia, unspecified                           R12, Heartburn CPT copyright 2019 American Medical Association. All rights reserved. The codes documented in this report are preliminary and upon coder review may  be revised to meet current compliance requirements. Elon Alas. [name], DO [place] [name], DO 12/31/2020 9:22:26 AM This report has been signed electronically. Number of Addenda: 0

## 2021-01-02 ENCOUNTER — Telehealth: Payer: Self-pay | Admitting: Internal Medicine

## 2021-01-02 LAB — SURGICAL PATHOLOGY

## 2021-01-02 MED ORDER — BUDESONIDE 3 MG PO CPEP: ORAL_CAPSULE | ORAL | 0 refills | Status: DC

## 2021-01-02 NOTE — Telephone Encounter (Signed)
Patient's colon biopsies showed lymphocytic colitis.  I called and discussed this in depth with the patient.  She is having numerous loose stools daily.  I will start her treatment with budesonide 9 mg daily for 8 weeks then 6 mg daily for 2 weeks then 3 mg daily for 2 weeks.  I will send this prescription to [facility].  Follow-up with GI as previously scheduled.

## 2021-01-08 ENCOUNTER — Ambulatory Visit (INDEPENDENT_AMBULATORY_CARE_PROVIDER_SITE_OTHER): Payer: Medicaid Other | Admitting: Physician Assistant

## 2021-01-08 ENCOUNTER — Encounter: Payer: Self-pay | Admitting: Physician Assistant

## 2021-01-08 DIAGNOSIS — Z79891 Long term (current) use of opiate analgesic: Secondary | ICD-10-CM | POA: Diagnosis not present

## 2021-01-08 DIAGNOSIS — S93491D Sprain of other ligament of right ankle, subsequent encounter: Secondary | ICD-10-CM

## 2021-01-08 DIAGNOSIS — F411 Generalized anxiety disorder: Secondary | ICD-10-CM | POA: Diagnosis not present

## 2021-01-08 DIAGNOSIS — F3132 Bipolar disorder, current episode depressed, moderate: Secondary | ICD-10-CM | POA: Diagnosis not present

## 2021-01-08 DIAGNOSIS — F4310 Post-traumatic stress disorder, unspecified: Secondary | ICD-10-CM | POA: Diagnosis not present
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
## 2021-01-20 DIAGNOSIS — F411 Generalized anxiety disorder: Secondary | ICD-10-CM | POA: Diagnosis not present

## 2021-01-20 DIAGNOSIS — F3132 Bipolar disorder, current episode depressed, moderate: Secondary | ICD-10-CM | POA: Diagnosis not present

## 2021-01-20 DIAGNOSIS — F4310 Post-traumatic stress disorder, unspecified: Secondary | ICD-10-CM | POA: Diagnosis not present

## 2021-01-21 DIAGNOSIS — Z79891 Long term (current) use of opiate analgesic: Secondary | ICD-10-CM | POA: Diagnosis not present

## 2021-01-21 DIAGNOSIS — J302 Other seasonal allergic rhinitis: Secondary | ICD-10-CM | POA: Diagnosis not present

## 2021-01-21 DIAGNOSIS — F341 Dysthymic disorder: Secondary | ICD-10-CM | POA: Diagnosis not present

## 2021-01-21 DIAGNOSIS — G4700 Insomnia, unspecified: Secondary | ICD-10-CM | POA: Diagnosis not present

## 2021-01-21 DIAGNOSIS — M79604 Pain in right leg: Secondary | ICD-10-CM | POA: Diagnosis not present

## 2021-01-21 DIAGNOSIS — M461 Sacroiliitis, not elsewhere classified: Secondary | ICD-10-CM | POA: Diagnosis not present

## 2021-01-21 DIAGNOSIS — M545 Low back pain, unspecified: Secondary | ICD-10-CM | POA: Diagnosis not present

## 2021-01-21 DIAGNOSIS — M79672 Pain in left foot: Secondary | ICD-10-CM | POA: Diagnosis not present

## 2021-01-21 DIAGNOSIS — G4733 Obstructive sleep apnea (adult) (pediatric): Secondary | ICD-10-CM | POA: Diagnosis not present

## 2021-01-21 DIAGNOSIS — K219 Gastro-esophageal reflux disease without esophagitis: Secondary | ICD-10-CM | POA: Diagnosis not present

## 2021-01-21 DIAGNOSIS — G5603 Carpal tunnel syndrome, bilateral upper limbs: Secondary | ICD-10-CM | POA: Diagnosis not present

## 2021-01-21 DIAGNOSIS — M5416 Radiculopathy, lumbar region: Secondary | ICD-10-CM | POA: Diagnosis not present

## 2021-02-14 DIAGNOSIS — F4310 Post-traumatic stress disorder, unspecified: Secondary | ICD-10-CM | POA: Diagnosis not present

## 2021-02-14 DIAGNOSIS — F411 Generalized anxiety disorder: Secondary | ICD-10-CM | POA: Diagnosis not present

## 2021-02-14 DIAGNOSIS — F3132 Bipolar disorder, current episode depressed, moderate: Secondary | ICD-10-CM | POA: Diagnosis not present

## 2021-03-12 ENCOUNTER — Ambulatory Visit: Payer: Medicaid Other | Admitting: Gastroenterology

## 2021-03-13 NOTE — Progress Notes (Deleted)
PATIENT CANCELLED

## 2021-03-14 ENCOUNTER — Ambulatory Visit (HOSPITAL_COMMUNITY): Payer: Medicaid Other | Admitting: Physician Assistant

## 2021-03-14 ENCOUNTER — Inpatient Hospital Stay (HOSPITAL_COMMUNITY): Payer: No Typology Code available for payment source

## 2021-03-25 DIAGNOSIS — M545 Low back pain, unspecified: Secondary | ICD-10-CM | POA: Diagnosis not present

## 2021-03-25 DIAGNOSIS — G5603 Carpal tunnel syndrome, bilateral upper limbs: Secondary | ICD-10-CM | POA: Diagnosis not present

## 2021-03-25 DIAGNOSIS — K219 Gastro-esophageal reflux disease without esophagitis: Secondary | ICD-10-CM | POA: Diagnosis not present

## 2021-03-25 DIAGNOSIS — M79672 Pain in left foot: Secondary | ICD-10-CM | POA: Diagnosis not present

## 2021-03-25 DIAGNOSIS — G4733 Obstructive sleep apnea (adult) (pediatric): Secondary | ICD-10-CM | POA: Diagnosis not present

## 2021-03-25 DIAGNOSIS — J302 Other seasonal allergic rhinitis: Secondary | ICD-10-CM | POA: Diagnosis not present

## 2021-03-25 DIAGNOSIS — M461 Sacroiliitis, not elsewhere classified: Secondary | ICD-10-CM | POA: Diagnosis not present

## 2021-03-25 DIAGNOSIS — M79604 Pain in right leg: Secondary | ICD-10-CM | POA: Diagnosis not present

## 2021-03-25 DIAGNOSIS — Z79891 Long term (current) use of opiate analgesic: Secondary | ICD-10-CM | POA: Diagnosis not present

## 2021-03-25 DIAGNOSIS — M5416 Radiculopathy, lumbar region: Secondary | ICD-10-CM | POA: Diagnosis not present

## 2021-03-25 DIAGNOSIS — G4700 Insomnia, unspecified: Secondary | ICD-10-CM | POA: Diagnosis not present

## 2021-03-25 DIAGNOSIS — F341 Dysthymic disorder: Secondary | ICD-10-CM | POA: Diagnosis not present

## 2021-03-26 DIAGNOSIS — F3132 Bipolar disorder, current episode depressed, moderate: Secondary | ICD-10-CM | POA: Diagnosis not present

## 2021-03-26 DIAGNOSIS — F411 Generalized anxiety disorder: Secondary | ICD-10-CM | POA: Diagnosis not present

## 2021-03-26 DIAGNOSIS — F4310 Post-traumatic stress disorder, unspecified: Secondary | ICD-10-CM | POA: Diagnosis not present

## 2021-03-29 ENCOUNTER — Telehealth: Payer: Medicaid Other | Admitting: Nurse Practitioner

## 2021-03-29 DIAGNOSIS — J040 Acute laryngitis: Secondary | ICD-10-CM | POA: Diagnosis not present

## 2021-03-29 MED ORDER — FLUTICASONE PROPIONATE 50 MCG/ACT NA SUSP: 2.0000 | Freq: Every day | NASAL | 6 refills | Status: DC

## 2021-03-29 MED ORDER — PREDNISONE 20 MG PO TABS: 40.0000 mg | ORAL_TABLET | Freq: Every day | ORAL | 0 refills | Status: AC

## 2021-03-29 NOTE — Progress Notes (Signed)
Virtual Visit Consent   Taylor [name], you are scheduled for a virtual visit with Mary-Margaret Hassell Done, [place], a [facility] provider, today.     Just as with appointments in the office, your consent must be obtained to participate.  Your consent will be active for this visit and any virtual visit you may have with one of our providers in the next 365 days.     If you have a MyChart account, a copy of this consent can be sent to you electronically.  All virtual visits are billed to your insurance company just like a traditional visit in the office.    As this is a virtual visit, video technology does not allow for your provider to perform a traditional examination.  This may limit your provider's ability to fully assess your condition.  If your provider identifies any concerns that need to be evaluated in person or the need to arrange testing (such as labs, EKG, etc.), we will make arrangements to do so.     Although advances in technology are sophisticated, we cannot ensure that it will always work on either your end or our end.  If the connection with a video visit is poor, the visit may have to be switched to a telephone visit.  With either a video or telephone visit, we are not always able to ensure that we have a secure connection.     I need to obtain your verbal consent now.   Are you willing to proceed with your visit today? YES   Taylor [name] has provided verbal consent on 03/29/2021 for a virtual visit (video or telephone).   Mary-Margaret Hassell Done, FNP   Date: 03/29/2021 12:53 PM   Virtual Visit via Video Note   I, Mary-Margaret Hassell Done, connected with Taylor [name] ([id number], 1990-12-22) on 03/29/21 at  1:15 PM EST by a video-enabled telemedicine application and verified that I am speaking with the correct person using two identifiers.  Location: Patient: Virtual Visit Location Patient: Home Provider: Virtual Visit Location Provider: Mobile   I discussed the  limitations of evaluation and management by telemedicine and the availability of in person appointments. The patient expressed understanding and agreed to proceed.    History of Present Illness: Taylor [name] is a 30 y.o. who identifies as a female who was assigned female at birth, and is being seen today for laryngitis .  HPI: Patient does video visit stating that she had laryngitis yesterday. Has slight voice today. As a constant tickle in her throat taht is causing her to cough.  Review of Systems  Constitutional:  Negative for chills, fever and malaise/fatigue.  HENT:  Positive for congestion (slight) and sore throat (scratchy throat).   Respiratory:  Positive for cough (from PND).   Musculoskeletal:  Negative for myalgias.  Neurological:  Negative for dizziness and headaches.   Problems:  Patient Active Problem List   Diagnosis Date Noted   Dysphagia 11/28/2020   Bloating 11/28/2020   Incontinence of feces with fecal urgency 11/15/2020   Sprain of anterior talofibular ligament of right ankle    Impingement of right ankle joint    PTSD (post-traumatic stress disorder) 10/04/2020   Migraine without aura and without status migrainosus, not intractable 10/04/2020   Pelvic pain 01/30/2020   Hemorrhoids 01/30/2020   Closed fracture of base of fifth metatarsal bone of left foot at metaphyseal-diaphyseal junction    Leukocytosis 09/20/2019   Pain in left foot 09/14/2019   Chronic depression  08/04/2019   Chronic back pain 08/04/2019   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[place]) 08/04/2019   Lumbar radiculopathy 08/04/2019   IUD (intrauterine device) in place 04/27/2019   Morbid obesity ([place]) 01/23/2019   H/O Abnormal LFTs 10/17/2018   Bicuspid aortic valve    GAD (generalized anxiety disorder) 06/30/2018   GERD (gastroesophageal reflux disease) 06/06/2018   Current smoker 06/06/2018   Depression, major, single episode, moderate ([place]) 06/06/2018    Allergies:  Allergies   Allergen Reactions   Other Itching    Animal Dander   Medications:  Current Outpatient Medications:    B Complex-C (SUPER B COMPLEX PO), Take 1 tablet by mouth daily., Disp: , Rfl:    bismuth subsalicylate (PEPTO-BISMOL) 262 MG/15ML suspension, Take 30 mLs by mouth every 6 (six) hours as needed. (Patient not taking: No sig reported), Disp: 360 mL, Rfl: 0   budesonide (ENTOCORT EC) 3 MG 24 hr capsule, Take 3 capsules daily x8 weeks then 2 capsules daily x2 weeks then 1 capsule daily x2 weeks then stop., Disp: 210 capsule, Rfl: 0   buprenorphine (BUTRANS) 15 MCG/HR, Place 1 patch onto the skin once a week., Disp: , Rfl:    buPROPion (WELLBUTRIN XL) 150 MG 24 hr tablet, TAKE 1 TABLET(150 MG) BY MOUTH DAILY, Disp: 90 tablet, Rfl: 1   diclofenac (VOLTAREN) 75 MG EC tablet, Take 1 tablet (75 mg total) by mouth 2 (two) times daily., Disp: 60 tablet, Rfl: 2   dicyclomine (BENTYL) 10 MG capsule, Take 1 capsule (10 mg total) by mouth 4 (four) times daily., Disp: 120 capsule, Rfl: 5   diphenhydrAMINE (BENADRYL) 25 MG tablet, Take 25 mg by mouth every 6 (six) hours as needed for allergies., Disp: , Rfl:    DULoxetine (CYMBALTA) 60 MG capsule, Take 60 mg by mouth daily., Disp: , Rfl:    gabapentin (NEURONTIN) 600 MG tablet, Take 1 tablet (600 mg total) by mouth 3 (three) times daily. (Patient taking differently: Take 600 mg by mouth in the morning, at noon, in the evening, and at bedtime.), Disp: 90 tablet, Rfl: 2   HYDROcodone-acetaminophen (NORCO) 10-325 MG tablet, Take 1 tablet by mouth 2 (two) times daily as needed for pain., Disp: , Rfl:    ibuprofen (ADVIL) 200 MG tablet, Take 400 mg by mouth every 6 (six) hours as needed for mild pain or moderate pain (Swelling)., Disp: , Rfl:    omeprazole (PRILOSEC) 40 MG capsule, Take 1 capsule (40 mg total) by mouth 2 (two) times daily before a meal., Disp: 60 capsule, Rfl: 5   Suvorexant 15 MG TABS, Take 15 mg by mouth at bedtime., Disp: , Rfl:    VRAYLAR 1.5 MG  capsule, Take 1.5 mg by mouth at bedtime., Disp: , Rfl:   Observations/Objective: Patient is well-developed, well-nourished in no acute distress.  Resting comfortably  at home.  Head is normocephalic, atraumatic.  No labored breathing.  Speech is clear and coherent with logical content.  Patient is alert and oriented at baseline.  Voice hoarse  Assessment and Plan: Taylor [name] in today with chief complaint of no voice  1. Laryngitis Force fluids Rest voice - fluticasone (FLONASE) 50 MCG/ACT nasal spray; Place 2 sprays into both nostrils daily.  Dispense: 16 g; Refill: 6 - predniSONE (DELTASONE) 20 MG tablet; Take 2 tablets (40 mg total) by mouth daily with breakfast for 5 days. 2 po daily for 5 days  Dispense: 10 tablet; Refill: 0     Follow Up Instructions: I  discussed the assessment and treatment plan with the patient. The patient was provided an opportunity to ask questions and all were answered. The patient agreed with the plan and demonstrated an understanding of the instructions.  A copy of instructions were sent to the patient via MyChart.  The patient was advised to call back or seek an in-person evaluation if the symptoms worsen or if the condition fails to improve as anticipated.  Time:  I spent 11 minutes with the patient via telehealth technology discussing the above problems/concerns.    Mary-Margaret Hassell Done, FNP
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
## 2021-04-09 ENCOUNTER — Inpatient Hospital Stay (HOSPITAL_COMMUNITY): Payer: Medicaid Other | Admitting: Physician Assistant

## 2021-04-09 ENCOUNTER — Other Ambulatory Visit: Payer: Self-pay

## 2021-04-09 ENCOUNTER — Inpatient Hospital Stay (HOSPITAL_COMMUNITY): Payer: Medicaid Other | Attending: Hematology

## 2021-04-09 VITALS — BP 126/79 | HR 94 | Temp 97.0°F | Resp 17 | Wt 258.8 lb

## 2021-04-09 DIAGNOSIS — F17210 Nicotine dependence, cigarettes, uncomplicated: Secondary | ICD-10-CM | POA: Insufficient documentation

## 2021-04-09 DIAGNOSIS — D72829 Elevated white blood cell count, unspecified: Secondary | ICD-10-CM | POA: Insufficient documentation

## 2021-04-09 LAB — CBC WITH DIFFERENTIAL/PLATELET
Abs Immature Granulocytes: 0.09 10*3/uL — ABNORMAL HIGH (ref 0.00–0.07)
Basophils Absolute: 0.1 10*3/uL (ref 0.0–0.1)
Basophils Relative: 1 %
Eosinophils Absolute: 0.3 10*3/uL (ref 0.0–0.5)
Eosinophils Relative: 2 %
HCT: 41.7 % (ref 36.0–46.0)
Hemoglobin: 13.7 g/dL (ref 12.0–15.0)
Immature Granulocytes: 1 %
Lymphocytes Relative: 25 %
Lymphs Abs: 3 10*3/uL (ref 0.7–4.0)
MCH: 32.3 pg (ref 26.0–34.0)
MCHC: 32.9 g/dL (ref 30.0–36.0)
MCV: 98.3 fL (ref 80.0–100.0)
Monocytes Absolute: 0.7 10*3/uL (ref 0.1–1.0)
Monocytes Relative: 6 %
Neutro Abs: 7.6 10*3/uL (ref 1.7–7.7)
Neutrophils Relative %: 65 %
Platelets: 258 10*3/uL (ref 150–400)
RBC: 4.24 MIL/uL (ref 3.87–5.11)
RDW: 12.6 % (ref 11.5–15.5)
WBC: 11.6 10*3/uL — ABNORMAL HIGH (ref 4.0–10.5)
nRBC: 0 % (ref 0.0–0.2)

## 2021-04-09 LAB — COMPREHENSIVE METABOLIC PANEL
ALT: 42 U/L (ref 0–44)
AST: 27 U/L (ref 15–41)
Albumin: 4.2 g/dL (ref 3.5–5.0)
Alkaline Phosphatase: 83 U/L (ref 38–126)
Anion gap: 7 (ref 5–15)
BUN: 16 mg/dL (ref 6–20)
CO2: 27 mmol/L (ref 22–32)
Calcium: 9.4 mg/dL (ref 8.9–10.3)
Chloride: 101 mmol/L (ref 98–111)
Creatinine, Ser: 0.66 mg/dL (ref 0.44–1.00)
GFR, Estimated: >60 mL/min (ref >60)
Glucose, Bld: 113 mg/dL — ABNORMAL HIGH (ref 70–99)
Potassium: 4.5 mmol/L (ref 3.5–5.1)
Sodium: 135 mmol/L (ref 135–145)
Total Bilirubin: 0.3 mg/dL (ref 0.3–1.2)
Total Protein: 7.2 g/dL (ref 6.5–8.1)

## 2021-04-09 LAB — LACTATE DEHYDROGENASE: LDH: 144 U/L (ref 98–192)

## 2021-04-09 NOTE — Patient Instructions (Signed)
[place] at [facility] Discharge Instructions  You were seen today by Tarri Abernethy PA-C for your elevated white blood cell count.  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  There is no need for treatment of your elevated white blood cell count at this time.  We will continue to monitor with periodic labs.  LABS: Return in 6 months for repeat labs  OTHER TESTS: No other tests at this time  MEDICATIONS: No changes to home medications  FOLLOW-UP APPOINTMENT: Phone visit in 6 months, after labs   Thank you for choosing [place] at [facility] to provide your oncology and hematology care.  To afford each patient quality time with our provider, please arrive at least 15 minutes before your scheduled appointment time.   If you have a lab appointment with the [place] please come in thru the Main Entrance and check in at the main information desk.  You need to re-schedule your appointment should you arrive 10 or more minutes late.  We strive to give you quality time with our providers, and arriving late affects you and other patients whose appointments are after yours.  Also, if you no show three or more times for appointments you may be dismissed from the clinic at the providers discretion.     Again, thank you for choosing [facility].  Our hope is that these requests will decrease the amount of time that you wait before being seen by our physicians.       _____________________________________________________________  Should you have questions after your visit to [facility], please contact our office at [phone] and follow the prompts.  Our office hours are 8:00 a.m. and 4:30 p.m. Monday - Friday.  Please note that voicemails left after 4:00 p.m. may not be  returned until the following business day.  We are closed weekends and major holidays.  You do have access to a nurse 24-7, just call the main number to the clinic [phone] and do not press any options, hold on the line and a nurse will answer the phone.    For prescription refill requests, have your pharmacy contact our office and allow 72 hours.    Due to Covid, you will need to wear a mask upon entering the hospital. If you do not have a mask, a mask will be given to you at the Main Entrance upon arrival. For doctor visits, patients may have 1 support person age 32 or older with them. For treatment visits, patients can not have anyone with them due to social distancing guidelines and our immunocompromised population.

## 2021-04-14 ENCOUNTER — Encounter: Payer: Self-pay | Admitting: Family Medicine

## 2021-04-14 ENCOUNTER — Ambulatory Visit (INDEPENDENT_AMBULATORY_CARE_PROVIDER_SITE_OTHER): Payer: Medicaid Other

## 2021-04-14 ENCOUNTER — Ambulatory Visit: Payer: Medicaid Other | Admitting: Family Medicine

## 2021-04-14 VITALS — BP 113/76 | HR 91 | Temp 97.6°F | Ht 64.0 in | Wt 260.8 lb

## 2021-04-14 DIAGNOSIS — M25552 Pain in left hip: Secondary | ICD-10-CM

## 2021-04-14 DIAGNOSIS — S6991XA Unspecified injury of right wrist, hand and finger(s), initial encounter: Secondary | ICD-10-CM | POA: Diagnosis not present

## 2021-04-14 MED ORDER — DICLOFENAC SODIUM 75 MG PO TBEC: 75.0000 mg | DELAYED_RELEASE_TABLET | Freq: Two times a day (BID) | ORAL | 2 refills | Status: DC
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
## 2021-04-16 ENCOUNTER — Telehealth: Payer: Self-pay | Admitting: Family

## 2021-04-16 NOTE — Telephone Encounter (Signed)
Opal Sidles called from [facility] to give call report.  RT ring finger xray- questionable non displaced fracture- Distal aspect of 4th finger

## 2021-04-16 NOTE — Telephone Encounter (Signed)
The radiologist agreedthat there is a slight fracture where we discussed. Please wear the splint for 2 weeks.

## 2021-04-22 NOTE — Telephone Encounter (Signed)
She should come in for a new splint.

## 2021-04-22 NOTE — Telephone Encounter (Signed)
Patient aware.  She said she has been unable to wear the splint because it was too large on one side of it and rubbing her hand causing a blister.

## 2021-04-23 NOTE — Telephone Encounter (Signed)
Called patient, no answer 

## 2021-04-24 NOTE — Telephone Encounter (Signed)
Contacted patient.  She does not want to come all the way just or another splint.  She has some ideas how to protect the skin to prevent the blisters

## 2021-05-16 ENCOUNTER — Ambulatory Visit: Payer: Medicaid Other | Admitting: Gastroenterology

## 2021-05-16 ENCOUNTER — Other Ambulatory Visit: Payer: Self-pay

## 2021-05-16 ENCOUNTER — Encounter: Payer: Self-pay | Admitting: Gastroenterology

## 2021-05-16 ENCOUNTER — Encounter: Payer: Self-pay | Admitting: Internal Medicine

## 2021-05-16 VITALS — BP 132/87 | HR 93 | Temp 97.3°F | Ht 64.0 in | Wt 266.0 lb

## 2021-05-16 DIAGNOSIS — K52832 Lymphocytic colitis: Secondary | ICD-10-CM | POA: Diagnosis not present

## 2021-05-16 DIAGNOSIS — K219 Gastro-esophageal reflux disease without esophagitis: Secondary | ICD-10-CM

## 2021-05-16 DIAGNOSIS — K649 Unspecified hemorrhoids: Secondary | ICD-10-CM

## 2021-05-16 MED ORDER — DICYCLOMINE HCL 10 MG PO CAPS: 10.0000 mg | ORAL_CAPSULE | Freq: Four times a day (QID) | ORAL | 5 refills | Status: DC

## 2021-05-16 NOTE — Progress Notes (Signed)
Primary Care Physician: Sharion Balloon, FNP  Primary Gastroenterologist:  Elon Alas. [name], DO   Chief Complaint  Patient presents with   colitis f/u    Reports foe the past few weeks has had loose stools several times per day, abd pain. No nausea, no vomiting, no blood in stool    HPI: Taylor [name] is a 31 y.o. female here for follow-up.  She was seen in the office back in July and chronic diarrhea of 1 year duration.  Some days having up to 5-8 loose bowel movements.  Also having chronic GERD with breakthrough symptoms, dysphagia on omeprazole 40 mg daily.  She had negative celiac serologies, TTG IgG and TTG IgA negative.  IgA level normal.  EGD and colonoscopy completed.  See findings below.  Diagnosed with lymphocytic colitis, started on budesonide 9 mg daily for 8 weeks, 6 mg daily for 2 weeks, 3 mg daily for 2 weeks.  Today: states her diarrhea improved significantly while on budesonide. Slowly her symptoms are coming back. Not as bad as the first time. Generally most of her stools are in the mornings. Has 2-3 loose stools associated with abdominal cramping.  No longer taking dicyclomine, ran out.  Previously was taking 1-2 times daily and seemed to help with cramps.  States her reflux is flared up as well.  Often forgets nighttime dose of omeprazole.  Notes her dysphagia was a lot better after esophageal dilation.  Has had a couple of times where food seems to get hung up but overall much improved.  Admits to needing to watch her diet more closely.  She loves spicy foods.  Typically puts hot sauce on most foods although had not for a couple weeks when she did not have it available.  Did not really notice a difference in her GI symptoms when not consuming.  Complains of terrible hemorrhoid issues.  Difficult to clean up after BMs.  Sometimes painful, sometimes bleed.  Would like something done about them.  Since her colonoscopy, she did stop ibuprofen.  She continues to take  Voltaren twice daily for hip pain.  She takes "rounds " as needed.  States she is not able to function without it when her bursitis is acting up.    Weight is up 20 pounds since August 2022.      EGD December 31, 2020: -Z-line regular, 39 cm from the incisors. -Gastritis. Biopsied.  Reactive gastropathy with focal intestinal metaplasia.  Negative for H. pylori. -Normal duodenal bulb, first portion of the duodenum and second portion of the duodenum. -Biopsies were taken with a cold forceps for histology in the middle third of the esophagus.  Biopsy showed benign squamous mucosa. -Status post esophageal dilation  Colonoscopy December 31, 2020 -Non-bleeding internal hemorrhoids. -Small lipoma in the rectum. Biopsied.  Biopsy showed lymphocytic colitis. -The examination was otherwise normal. -Biopsies were taken with a cold forceps from the ascending colon, transverse colon and descending colon for evaluation of microscopic colitis.  Biopsies showed lymphocytic colitis.  Current Outpatient Medications  Medication Sig Dispense Refill   B Complex-C (SUPER B COMPLEX PO) Take 1 tablet by mouth daily.     buprenorphine (BUTRANS) 15 MCG/HR Place 1 patch onto the skin once a week.     buPROPion (WELLBUTRIN XL) 150 MG 24 hr tablet TAKE 1 TABLET(150 MG) BY MOUTH DAILY (Patient taking differently: Take 300 mg by mouth daily.) 90 tablet 1   diclofenac (VOLTAREN) 75 MG EC tablet Take  1 tablet (75 mg total) by mouth 2 (two) times daily. For muscle and  Joint pain 60 tablet 2   dicyclomine (BENTYL) 10 MG capsule Take 1 capsule (10 mg total) by mouth 4 (four) times daily. (Patient taking differently: Take 10 mg by mouth 4 (four) times daily. As needed) 120 capsule 5   diphenhydrAMINE (BENADRYL) 25 MG tablet Take 25 mg by mouth every 6 (six) hours as needed for allergies.     DULoxetine (CYMBALTA) 30 MG capsule duloxetine 30 mg capsule,delayed release  TAKE ONE CAPSULE BY MOUTH DAILY EVERY MORNING WITH 60 MG  CAPSULES FOR TOTAL OF 90 MG     gabapentin (NEURONTIN) 600 MG tablet Take 1 tablet (600 mg total) by mouth 3 (three) times daily. (Patient taking differently: Take 600 mg by mouth in the morning, at noon, in the evening, and at bedtime.) 90 tablet 2   HYDROcodone-acetaminophen (NORCO) 10-325 MG tablet Take 1 tablet by mouth 2 (two) times daily as needed for pain.     omeprazole (PRILOSEC) 40 MG capsule Take 1 capsule (40 mg total) by mouth 2 (two) times daily before a meal. 60 capsule 5   Suvorexant 15 MG TABS Take 15 mg by mouth at bedtime.     No current facility-administered medications for this visit.    Allergies as of 05/16/2021 - Review Complete 05/16/2021  Allergen Reaction Noted   Other Itching 09/13/2019    ROS:  General: Negative for anorexia, weight loss, fever, chills, fatigue, weakness. ENT: Negative for hoarseness, difficulty swallowing , nasal congestion. CV: Negative for chest pain, angina, palpitations, dyspnea on exertion, peripheral edema.  Respiratory: Negative for dyspnea at rest, dyspnea on exertion, cough, sputum, wheezing.  GI: See history of present illness. GU:  Negative for dysuria, hematuria, urinary incontinence, urinary frequency, nocturnal urination.  Endo: Negative for unusual weight change.    Physical Examination:   BP 132/87    Pulse 93    Temp (!) 97.3 F (36.3 C)    Ht 5\' 4"  (1.626 m)    Wt 266 lb (120.7 kg)    BMI 45.66 kg/m   General: Well-nourished, well-developed in no acute distress.  Eyes: No icterus. Mouth: masked.  Abdomen: Bowel sounds are normal, nontender, nondistended, no hepatosplenomegaly or masses, no abdominal bruits or hernia , no rebound or guarding.   Extremities: No lower extremity edema. No clubbing or deformities. Neuro: Alert and oriented x 4   Skin: Warm and dry, no jaundice.   Psych: Alert and cooperative, normal mood and affect.    Imaging Studies: No results found.   Assessment:  GERD: Symptoms improved  with twice daily omeprazole.  She was advised to take twice daily for 8 weeks and then go back to once daily.  Sometimes she does take twice daily because of persisting heartburn.  Dysphagia responded to esophageal dilation.  Reinforced antireflux measures.  Likely benefit from PPI twice daily dosing at least for 1 month until things settle down.  Diarrhea: Diagnosed with lymphocytic colitis in August 2022.  Responded nicely to budesonide.  Gradually she feels like her symptoms are returning.  At this time having 1-2 loose stools just in the mornings.  Symptoms may be controllable with dicyclomine.  Would advise taking dose at bedtime to see if this helps her early morning symptoms.  Consider taking second dose later in the day.  If this does not improve her stool consistency/frequency, would consider another round of budesonide.  Patient is agreeable.  Hemorrhoids: Noted  to have nonbleeding internal hemorrhoids on recent colonoscopy.  Patient states it is hard to clean up, sometimes painful, frequent bleeding.  She would like to have her hemorrhoids banded.  We will bring her back for banding with Roseanne Kaufman, NP.  Patient was advised that if her diarrhea has not improved, would recommend delaying hemorrhoid banding until her diarrhea is better controlled.  Also discussed realistic goals from hemorrhoid banding.  She is aware that banding will not necessarily improve any external redundant tissue or tags as she pointed out that this part is somewhat embarrassing for her during intimacy.   Plan: Start back on dicyclomine 10 mg every evening at bedtime.  Take an additional dose during the day.  She is to try this regularly for the next week or so to see if this helps with her stool frequency/consistency.  If not, could consider another course of desonide.   Increase omeprazole to twice daily for few weeks, once reflux symptoms improved, go back to once daily dosing.   Enforce antireflux measures.   Return  for hemorrhoid banding with Roseanne Kaufman, NP.

## 2021-05-16 NOTE — Patient Instructions (Signed)
Take dicyclomine (Bentyl) one capsule at bedtime to see if this helps with your morning loose stools. You can take up to four times a day but for now I would like for you to take once every morning and if needed take an additional dose. Let me know if your diarrhea does not improve. At that point we would put you back on budesonide.  Go back to twice a day omeprazole for few weeks until your reflux is settled back down. When you can tolerate, goal would be taking it only once a day.  We will bring you back for hemorrhoid banding with Roseanne Kaufman, NP.

## 2021-05-21 DIAGNOSIS — F4310 Post-traumatic stress disorder, unspecified: Secondary | ICD-10-CM | POA: Diagnosis not present

## 2021-05-21 DIAGNOSIS — F3132 Bipolar disorder, current episode depressed, moderate: Secondary | ICD-10-CM | POA: Diagnosis not present

## 2021-05-21 DIAGNOSIS — F411 Generalized anxiety disorder: Secondary | ICD-10-CM | POA: Diagnosis not present

## 2021-05-23 ENCOUNTER — Telehealth: Payer: Self-pay | Admitting: Internal Medicine

## 2021-05-23 MED ORDER — BUDESONIDE 3 MG PO CPEP: ORAL_CAPSULE | ORAL | 0 refills | Status: DC

## 2021-05-23 NOTE — Telephone Encounter (Signed)
Pt states that the dicyclomine does not help with her colitis and would like to go back on the steroids. [facility] [address]

## 2021-05-23 NOTE — Telephone Encounter (Signed)
Patient called and said that the [employer] had her try last visit are not working.  It was  dicyclomene.  Patient has colitis and said she needs to have her steroid called in again to [facility] on [address]

## 2021-05-23 NOTE — Telephone Encounter (Signed)
Budesonide 9 mg daily for 8 weeks, then 6 mg daily for 2 weeks, then 3 mg daily for 2 weeks.  Call if persistent diarrhea.

## 2021-05-23 NOTE — Telephone Encounter (Signed)
Please let patient know I will send in RX for budesonide.

## 2021-05-23 NOTE — Addendum Note (Signed)
Addended by: Mahala Menghini on: 05/23/2021 12:33 PM   Modules accepted: Orders

## 2021-05-26 NOTE — Telephone Encounter (Signed)
Pt was made aware and verbalized understanding.  

## 2021-05-26 NOTE — Telephone Encounter (Signed)
Lmom for pt to return my call.  

## 2021-06-12 DIAGNOSIS — R4020 Unspecified coma: Secondary | ICD-10-CM | POA: Diagnosis not present

## 2021-06-12 DIAGNOSIS — T50904A Poisoning by unspecified drugs, medicaments and biological substances, undetermined, initial encounter: Secondary | ICD-10-CM | POA: Diagnosis not present

## 2021-06-12 DIAGNOSIS — T887XXA Unspecified adverse effect of drug or medicament, initial encounter: Secondary | ICD-10-CM | POA: Diagnosis not present

## 2021-06-12 DIAGNOSIS — R0681 Apnea, not elsewhere classified: Secondary | ICD-10-CM | POA: Diagnosis not present

## 2021-06-13 ENCOUNTER — Other Ambulatory Visit: Payer: Self-pay

## 2021-06-13 ENCOUNTER — Ambulatory Visit: Payer: Self-pay

## 2021-06-13 ENCOUNTER — Encounter: Payer: Self-pay | Admitting: Family

## 2021-06-13 ENCOUNTER — Ambulatory Visit (INDEPENDENT_AMBULATORY_CARE_PROVIDER_SITE_OTHER): Payer: Medicaid Other | Admitting: Family

## 2021-06-13 DIAGNOSIS — S99911A Unspecified injury of right ankle, initial encounter: Secondary | ICD-10-CM

## 2021-06-13 MED ORDER — ONDANSETRON HCL 4 MG PO TABS: 4.0000 mg | ORAL_TABLET | Freq: Three times a day (TID) | ORAL | 0 refills | Status: DC | PRN
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
## 2021-07-02 ENCOUNTER — Ambulatory Visit (HOSPITAL_COMMUNITY)
Admission: RE | Admit: 2021-07-02 | Discharge: 2021-07-02 | Disposition: A | Discharge status: Home / Self Care | Payer: Medicaid Other | Source: Ambulatory Visit | Attending: Family | Admitting: Family

## 2021-07-02 ENCOUNTER — Other Ambulatory Visit: Payer: Self-pay

## 2021-07-02 DIAGNOSIS — S99911A Unspecified injury of right ankle, initial encounter: Secondary | ICD-10-CM | POA: Insufficient documentation

## 2021-07-03 ENCOUNTER — Telehealth: Payer: Self-pay | Admitting: Family

## 2021-07-03 ENCOUNTER — Ambulatory Visit (HOSPITAL_COMMUNITY): Payer: Medicaid Other

## 2021-07-03 NOTE — Telephone Encounter (Signed)
Pt called requesting crutches. Pt states she just had an MRI . She has an upcoming appt when Dr. Sharol Given returns. She states she is in severe pain and reallt need crutches. Please call pt about this matter at [phone].

## 2021-07-04 NOTE — Telephone Encounter (Signed)
I recently left message offering pt to come to office today to pick up crutches. She has not returned my call. She does have an apt for MRI review with Dr.Duda and we can give her those crutches during that time.

## 2021-07-04 NOTE — Telephone Encounter (Signed)
lmtcb

## 2021-07-07 ENCOUNTER — Ambulatory Visit: Payer: Medicaid Other | Admitting: Orthopedic Surgery

## 2021-07-10 ENCOUNTER — Ambulatory Visit (INDEPENDENT_AMBULATORY_CARE_PROVIDER_SITE_OTHER): Payer: Medicaid Other | Admitting: Orthopedic Surgery

## 2021-07-10 ENCOUNTER — Encounter: Payer: Self-pay | Admitting: Orthopedic Surgery

## 2021-07-10 DIAGNOSIS — S9304XS Dislocation of right ankle joint, sequela: Secondary | ICD-10-CM

## 2021-07-10 DIAGNOSIS — S82141A Displaced bicondylar fracture of right tibia, initial encounter for closed fracture: Secondary | ICD-10-CM | POA: Diagnosis not present

## 2021-07-10 DIAGNOSIS — M25571 Pain in right ankle and joints of right foot: Secondary | ICD-10-CM | POA: Diagnosis not present

## 2021-07-10 DIAGNOSIS — S82871A Displaced pilon fracture of right tibia, initial encounter for closed fracture: Secondary | ICD-10-CM
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
## 2021-07-15 ENCOUNTER — Encounter (HOSPITAL_COMMUNITY): Payer: Self-pay | Admitting: Physician Assistant

## 2021-07-15 ENCOUNTER — Encounter (HOSPITAL_COMMUNITY): Payer: Self-pay | Admitting: Orthopedic Surgery

## 2021-07-15 ENCOUNTER — Other Ambulatory Visit: Payer: Self-pay

## 2021-07-15 NOTE — Progress Notes (Signed)
PCP - Evelina Dun, FNP ?Cardiologist - denies (pt has hx of bicuspid aortic valve - per pt she's had this condition and it has given her no trouble - she does not see a cardiologist for this)  ?EKG - DOS ?Chest x-ray -  ?ECHO - 10/11/18 ?Cardiac Cath -  ?CPAP - pt says she has "mild sleep apnea" but it's "so mild that I don't have to wear a CPAP or O2 at night"  ? ?ERAS Protcol - n/a ?COVID TEST- n/a ? ?Anesthesia review: yes (hx of bicuspid aortic valve) ? ?------------- ? ?SDW INSTRUCTIONS: ? ?Your procedure is scheduled on Wednesday 3/8. Please report to [facility] Main Entrance "A" at 0700 A.M., and check in at the Admitting office. Call this number if you have problems the morning of surgery: [phone] ? ? ?Remember: Do not eat or drink after midnight the night before your surgery ? ?Medications to take morning of surgery with a sip of water include: ?budesonide (ENTOCORT EC)  ?buPROPion (WELLBUTRIN XL)  ?DULoxetine (CYMBALTA) ?gabapentin (NEURONTIN)  ?HYDROcodone-acetaminophen (NORCO) if needed ?omeprazole (PRILOSEC)  ? ?As of today, STOP taking any buprenorphine (BUTRANS), diclofenac (VOLTAREN), Aspirin (unless otherwise instructed by your surgeon), Aleve, Naproxen, Ibuprofen, Motrin, Advil, Goody's, BC's, all herbal medications, fish oil, and all vitamins. ? ?  ?The Morning of Surgery ?Do not wear jewelry, make-up or nail polish. ?Do not wear lotions, powders, or perfumes/colognes, or deodorant ?Do not bring valuables to the hospital. ?[facility] is not responsible for any belongings or valuables. ? ?If you are a smoker, DO NOT Smoke 24 hours prior to surgery ? ?If you wear a CPAP at night please bring your mask the morning of surgery  ? ?Remember that you must have someone to transport you home after your surgery, and remain with you for 24 hours if you are discharged the same day. ? ?Please bring cases for contacts, glasses, hearing aids, dentures or bridgework because it cannot be worn into surgery.   ? ?Patients discharged the day of surgery will not be allowed to drive home.  ? ?Please shower the NIGHT BEFORE/MORNING OF SURGERY (use antibacterial soap like DIAL soap if possible). Wear comfortable clothes the morning of surgery. Oral Hygiene is also important to reduce your risk of infection.  Remember - BRUSH YOUR TEETH THE MORNING OF SURGERY WITH YOUR REGULAR TOOTHPASTE ? ?Patient denies shortness of breath, fever, cough and chest pain.  ? ? ?   ? ?

## 2021-07-16 ENCOUNTER — Ambulatory Visit (HOSPITAL_COMMUNITY): Admission: RE | Admit: 2021-07-16 | Payer: Medicaid Other | Source: Home / Self Care | Admitting: Orthopedic Surgery

## 2021-07-16 DIAGNOSIS — F411 Generalized anxiety disorder: Secondary | ICD-10-CM | POA: Diagnosis not present

## 2021-07-16 DIAGNOSIS — F3132 Bipolar disorder, current episode depressed, moderate: Secondary | ICD-10-CM | POA: Diagnosis not present

## 2021-07-16 DIAGNOSIS — F4310 Post-traumatic stress disorder, unspecified: Secondary | ICD-10-CM | POA: Diagnosis not present

## 2021-07-16 SURGERY — ANKLE FUSION
Anesthesia: Choice | Site: Ankle | Laterality: Right

## 2021-07-17 ENCOUNTER — Encounter: Payer: Self-pay | Admitting: Orthopedic Surgery

## 2021-07-29 ENCOUNTER — Encounter: Payer: Self-pay | Admitting: Orthopedic Surgery

## 2021-07-29 ENCOUNTER — Ambulatory Visit (INDEPENDENT_AMBULATORY_CARE_PROVIDER_SITE_OTHER): Payer: Medicaid Other

## 2021-07-29 ENCOUNTER — Ambulatory Visit (INDEPENDENT_AMBULATORY_CARE_PROVIDER_SITE_OTHER): Payer: Medicaid Other | Admitting: Orthopedic Surgery

## 2021-07-29 DIAGNOSIS — S82871A Displaced pilon fracture of right tibia, initial encounter for closed fracture: Secondary | ICD-10-CM | POA: Diagnosis not present
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
## 2021-08-05 ENCOUNTER — Other Ambulatory Visit: Payer: Self-pay

## 2021-08-05 ENCOUNTER — Ambulatory Visit (INDEPENDENT_AMBULATORY_CARE_PROVIDER_SITE_OTHER): Payer: Medicaid Other | Admitting: Orthopedic Surgery

## 2021-08-05 ENCOUNTER — Encounter: Payer: Self-pay | Admitting: Orthopedic Surgery

## 2021-08-05 ENCOUNTER — Ambulatory Visit (INDEPENDENT_AMBULATORY_CARE_PROVIDER_SITE_OTHER): Payer: Medicaid Other

## 2021-08-05 DIAGNOSIS — S9304XS Dislocation of right ankle joint, sequela: Secondary | ICD-10-CM

## 2021-08-05 DIAGNOSIS — M25571 Pain in right ankle and joints of right foot: Secondary | ICD-10-CM

## 2021-08-05 DIAGNOSIS — S82871A Displaced pilon fracture of right tibia, initial encounter for closed fracture: Secondary | ICD-10-CM
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
## 2021-08-13 ENCOUNTER — Encounter (HOSPITAL_COMMUNITY): Payer: Self-pay | Admitting: Orthopedic Surgery

## 2021-08-13 ENCOUNTER — Other Ambulatory Visit: Payer: Self-pay

## 2021-08-13 NOTE — Progress Notes (Signed)
DUE TO COVID-19 ONLY TWO VISITORS ARE ALLOWED TO COME WITH YOU AND STAY IN THE SURGICAL WAITING ROOM ONLY DURING PRE OP AND PROCEDURE DAY OF SURGERY.  ? ?PCP - Evelina Dun, FNP ?Cardiologist - n/a ? ?Chest x-ray - n/a ?EKG - n/a ?Stress Test - n/a ?ECHO - 10/11/18 ?Cardiac Cath - n/a ? ?ICD Pacemaker/Loop - n/a ? ?Sleep Study -  Yes ?CPAP - none ? ?ERAS: Clear liquids til 7 AM DOS. ? ?Anesthesia review: Yes ? ?STOP now taking any Aspirin (unless otherwise instructed by your surgeon), Aleve, Naproxen, Ibuprofen, Motrin, Advil, Goody's, BC's, all herbal medications, fish oil, and all vitamins.  ? ?Coronavirus Screening ?Covid test n/a.  ?Do you have any of the following symptoms:  ?Cough yes/no: No ?Fever (>100.60F)  yes/no: No ?Runny nose yes/no: No ?Sore throat yes/no: No ?Difficulty breathing/shortness of breath  yes/no: No ? ?Have you traveled in the last 14 days and where? yes/no: No ? ?Patient verbalized understanding of instructions that were given via phone.  ?

## 2021-08-14 ENCOUNTER — Encounter (HOSPITAL_COMMUNITY): Payer: Self-pay | Admitting: Physician Assistant

## 2021-08-15 ENCOUNTER — Ambulatory Visit (HOSPITAL_COMMUNITY): Admission: RE | Admit: 2021-08-15 | Payer: Medicaid Other | Source: Ambulatory Visit | Admitting: Orthopedic Surgery

## 2021-08-15 HISTORY — DX: Headache, unspecified: R51.9

## 2021-08-15 HISTORY — DX: Unspecified asthma, uncomplicated: J45.909

## 2021-08-15 HISTORY — DX: Fibromyalgia: M79.7

## 2021-08-15 HISTORY — DX: Bipolar disorder, unspecified: F31.9

## 2021-08-15 SURGERY — ANKLE FUSION
Anesthesia: Choice | Site: Ankle | Laterality: Right

## 2021-08-18 ENCOUNTER — Telehealth: Payer: Self-pay | Admitting: Orthopedic Surgery

## 2021-08-18 NOTE — Telephone Encounter (Signed)
Pt is calling states the insurance is advising her that Dr Sharol Given needs to do a Peer to peer review for the upcoming surgery  ?

## 2021-08-19 NOTE — Telephone Encounter (Signed)
See message from patient.  Thanks! ?

## 2021-08-20 NOTE — Telephone Encounter (Signed)
Can we sch it for tomorrow? I can let him know that "to speak to a manager" is not an option and that P2P is next step can you coordinate this for him?  ?

## 2021-08-20 NOTE — Telephone Encounter (Signed)
Please see below. Let me know if there is anything that I can do. Thanks! ?

## 2021-08-21 NOTE — Telephone Encounter (Signed)
P2P complete authorization number [id number]! ?

## 2021-08-21 NOTE — Telephone Encounter (Signed)
Taylor [name] is calling patient to schedule surgery. ?

## 2021-08-25 ENCOUNTER — Other Ambulatory Visit: Payer: Self-pay

## 2021-08-25 ENCOUNTER — Encounter (HOSPITAL_COMMUNITY): Payer: Self-pay | Admitting: Orthopedic Surgery

## 2021-08-25 NOTE — Pre-Procedure Instructions (Signed)
? ? [name] ? 08/25/2021  ?  ?  ?[id number] - , [address] ?[place] ?[place] [address] ?Phone: [phone] Fax: [phone] ? ? ?PCP - Evelina Dun, FNP ?Cardiologist - denies  ? ?EKG - DOS ?Chest x-ray -  ?ECHO - 10/11/18 ?Cardiac Cath -  ?CPAP - Does not wear CPAP d/t sleep apnea being "mild" ?  ?ERAS Protcol - n/a ?COVID TEST- n/a ?  ?Anesthesia review: yes (hx of bicuspid aortic valve) ? ?Patient verbally denies any shortness of breath, fever, cough and chest pain during phone call ? ? ?-------------  SDW INSTRUCTIONS given: ? ?Your procedure is scheduled on 08/27/21. ? Report to [facility] Main Entrance "A" at 0900 A.M., and check in at the Admitting office. ? Call this number if you have problems the morning of surgery: ? [phone] ? ? Remember: ? Do not eat after midnight the night before your surgery ? ?You may drink clear liquids until 0830 the morning of your surgery.   ?Clear liquids allowed are: Water, Non-Citrus Juices (without pulp), Carbonated Beverages, Clear Tea, Black Coffee Only, and Gatorade ?  ? Take these medicines the morning of surgery with A SIP OF WATER  ?budesonide (ENTOCORT EC) ?buPROPion (WELLBUTRIN XL) ?DULoxetine (CYMBALTA)  ?gabapentin (NEURONTIN)  ?omeprazole (PRILOSEC) ?HYDROcodone-acetaminophen (NORCO)-if needed ? ? ?As of today, STOP taking any buprenorphine (BUTRANS), diclofenac (VOLTAREN) Aspirin (unless otherwise instructed by your surgeon) Aleve, Naproxen, Ibuprofen, Motrin, Advil, Goody's, BC's, all herbal medications, fish oil, and all vitamins. ? ?         ?           Do not wear jewelry, make up, or nail polish ?           Do not wear lotions, powders, perfumes/colognes, or deodorant. ?           Do not shave 48 hours prior to surgery.  Men may shave face and neck. ?           Do not bring valuables to the hospital. ?           [facility] is not responsible for any  belongings or valuables. ? ?Do NOT Smoke (Tobacco/Vaping) 24 hours prior to your procedure ?If you use a CPAP at night, you may bring all equipment for your overnight stay. ?  ?Contacts, glasses, dentures or bridgework may not be worn into surgery.    ?  ?For patients admitted to the hospital, discharge time will be determined by your treatment team. ?  ?Patients discharged the day of surgery will not be allowed to drive home, and someone needs to stay with them for 24 hours. ? ? ? ?Special instructions:   ?[facility]- Preparing For Surgery ? ?Before surgery, you can play an important role. Because skin is not sterile, your skin needs to be as free of germs as possible. You can reduce the number of germs on your skin by washing with CHG (chlorahexidine gluconate) Soap before surgery.  CHG is an antiseptic cleaner which kills germs and bonds with the skin to continue killing germs even after washing.   ? ?Oral Hygiene is also important to reduce your risk of infection.  Remember - BRUSH YOUR TEETH THE MORNING OF SURGERY WITH YOUR REGULAR TOOTHPASTE ? ?Please do not use if you have an allergy to CHG or antibacterial soaps. If your skin  becomes reddened/irritated stop using the CHG.  ?Do not shave (including legs and underarms) for at least 48 hours prior to first CHG shower. It is OK to shave your face. ? ?Please follow these instructions carefully. ?  ?Shower the NIGHT BEFORE SURGERY and the MORNING OF SURGERY with DIAL Soap.  ? ?Pat yourself dry with a CLEAN TOWEL. ? ?Wear CLEAN PAJAMAS to bed the night before surgery ? ?Place CLEAN SHEETS on your bed the night of your first shower and DO NOT SLEEP WITH PETS. ? ? ?Day of Surgery: ?Please shower morning of surgery  ?Wear Clean/Comfortable clothing the morning of surgery ?Do not apply any deodorants/lotions.   ?Remember to brush your teeth WITH YOUR REGULAR TOOTHPASTE. ?  ?Questions were answered. Patient verbalized understanding of instructions.  ? ? ?   ? ?

## 2021-08-26 NOTE — Anesthesia Preprocedure Evaluation (Addendum)
Anesthesia Evaluation  ?Patient identified by MRN, date of birth, ID band ?Patient awake ? ? ? ?Reviewed: ?Allergy & Precautions, NPO status , Patient's Chart, lab work & pertinent test results ? ?History of Anesthesia Complications ?Negative for: history of anesthetic complications ? ?Airway ?Mallampati: III ? ?TM Distance: >3 FB ?Neck ROM: Full ? ? ? Dental ? ?(+) Poor Dentition, Chipped, Missing, Dental Advisory Given ?  ?Pulmonary ?sleep apnea (does not use CPAP) , Current Smoker and Patient abstained from smoking., former smoker,  ?10/23/2019 SARS coronavirus NEG ?  ?Pulmonary exam normal ? ? ? ? ? ? ? Cardiovascular ?Normal cardiovascular exam+ Valvular Problems/Murmurs (bicuspid aortic valve)  ? ?'20 ECHO: EF 55-60%, bicuspid aortic valve with out stenosis ?  ?Neuro/Psych ? Headaches, PSYCHIATRIC DISORDERS Anxiety Depression Bipolar Disorder   ? GI/Hepatic ?Neg liver ROS, GERD  Controlled and Medicated,  ?Endo/Other  ?Morbid obesity ? Renal/GU ?negative Renal ROS  ? ?  ?Musculoskeletal ? ?(+) Arthritis , Osteoarthritis,   ? Abdominal ?(+) + obese,   ?Peds ? Hematology ?negative hematology ROS ?(+)   ?Anesthesia Other Findings ? ? Reproductive/Obstetrics ? ?  ? ? ? ? ? ? ? ? ? ? ? ? ? ?  ?  ? ? ? ? ? ? ?Anesthesia Physical ? ?Anesthesia Plan ? ?ASA: 3 ? ?Anesthesia Plan: General  ? ?Post-op Pain Management:  Regional for Post-op pain and Regional block*, Celebrex PO (pre-op)* and Tylenol PO (pre-op)*  ? ?Induction: Intravenous ? ?PONV Risk Score and Plan: 2 and Ondansetron, Midazolam and Treatment may vary due to age or medical condition ? ?Airway Management Planned: LMA ? ?Additional Equipment: None ? ?Intra-op Plan:  ? ?Post-operative Plan: Extubation in OR ? ?Informed Consent: I have reviewed the patients History and Physical, chart, labs and discussed the procedure including the risks, benefits and alternatives for the proposed anesthesia with the patient or authorized  representative who has indicated his/her understanding and acceptance.  ? ? ? ?Dental advisory given ? ?Plan Discussed with: Anesthesiologist and CRNA ? ?Anesthesia Plan Comments: ( ?)  ? ? ? ? ? ?Anesthesia Quick Evaluation ? ?

## 2021-08-27 ENCOUNTER — Other Ambulatory Visit: Payer: Self-pay

## 2021-08-27 ENCOUNTER — Encounter (HOSPITAL_COMMUNITY): Payer: Self-pay | Admitting: Orthopedic Surgery

## 2021-08-27 ENCOUNTER — Ambulatory Visit (HOSPITAL_COMMUNITY)
Admission: RE | Admit: 2021-08-27 | Discharge: 2021-08-27 | Disposition: A | Discharge status: Home / Self Care | Payer: Medicaid Other | Source: Ambulatory Visit | Attending: Orthopedic Surgery | Admitting: Orthopedic Surgery

## 2021-08-27 ENCOUNTER — Encounter (HOSPITAL_COMMUNITY)
Admission: RE | Disposition: A | Discharge status: Home / Self Care | Payer: Self-pay | Source: Ambulatory Visit | Attending: Orthopedic Surgery

## 2021-08-27 ENCOUNTER — Ambulatory Visit (HOSPITAL_BASED_OUTPATIENT_CLINIC_OR_DEPARTMENT_OTHER): Payer: Medicaid Other | Admitting: Anesthesiology

## 2021-08-27 ENCOUNTER — Ambulatory Visit (HOSPITAL_COMMUNITY): Payer: Medicaid Other | Admitting: Anesthesiology

## 2021-08-27 DIAGNOSIS — X58XXXA Exposure to other specified factors, initial encounter: Secondary | ICD-10-CM | POA: Insufficient documentation

## 2021-08-27 DIAGNOSIS — F319 Bipolar disorder, unspecified: Secondary | ICD-10-CM | POA: Diagnosis not present

## 2021-08-27 DIAGNOSIS — G4730 Sleep apnea, unspecified: Secondary | ICD-10-CM

## 2021-08-27 DIAGNOSIS — Z6841 Body Mass Index (BMI) 40.0 and over, adult: Secondary | ICD-10-CM | POA: Insufficient documentation

## 2021-08-27 DIAGNOSIS — K219 Gastro-esophageal reflux disease without esophagitis: Secondary | ICD-10-CM | POA: Insufficient documentation

## 2021-08-27 DIAGNOSIS — F418 Other specified anxiety disorders: Secondary | ICD-10-CM | POA: Diagnosis not present

## 2021-08-27 DIAGNOSIS — S9301XA Subluxation of right ankle joint, initial encounter: Secondary | ICD-10-CM | POA: Insufficient documentation

## 2021-08-27 DIAGNOSIS — S82871A Displaced pilon fracture of right tibia, initial encounter for closed fracture: Secondary | ICD-10-CM | POA: Diagnosis not present

## 2021-08-27 DIAGNOSIS — S82891A Other fracture of right lower leg, initial encounter for closed fracture: Secondary | ICD-10-CM

## 2021-08-27 DIAGNOSIS — G8918 Other acute postprocedural pain: Secondary | ICD-10-CM | POA: Diagnosis not present

## 2021-08-27 DIAGNOSIS — M1990 Unspecified osteoarthritis, unspecified site: Secondary | ICD-10-CM | POA: Insufficient documentation

## 2021-08-27 HISTORY — PX: ANKLE FUSION: SHX5718

## 2021-08-27 LAB — BASIC METABOLIC PANEL
Anion gap: 12 (ref 5–15)
BUN: 16 mg/dL (ref 6–20)
CO2: 21 mmol/L — ABNORMAL LOW (ref 22–32)
Calcium: 9.5 mg/dL (ref 8.9–10.3)
Chloride: 105 mmol/L (ref 98–111)
Creatinine, Ser: 0.67 mg/dL (ref 0.44–1.00)
GFR, Estimated: >60 mL/min (ref >60)
Glucose, Bld: 97 mg/dL (ref 70–99)
Potassium: 3.9 mmol/L (ref 3.5–5.1)
Sodium: 138 mmol/L (ref 135–145)

## 2021-08-27 LAB — CBC
HCT: 39.1 % (ref 36.0–46.0)
Hemoglobin: 13.5 g/dL (ref 12.0–15.0)
MCH: 33.1 pg (ref 26.0–34.0)
MCHC: 34.5 g/dL (ref 30.0–36.0)
MCV: 95.8 fL (ref 80.0–100.0)
Platelets: 237 K/uL (ref 150–400)
RBC: 4.08 MIL/uL (ref 3.87–5.11)
RDW: 12.5 % (ref 11.5–15.5)
WBC: 7.7 K/uL (ref 4.0–10.5)
nRBC: 0 % (ref 0.0–0.2)

## 2021-08-27 SURGERY — ANKLE FUSION
Anesthesia: General | Site: Ankle | Laterality: Right

## 2021-08-27 MED ORDER — MIDAZOLAM HCL 2 MG/2ML IJ SOLN
INTRAMUSCULAR | Status: DC | PRN
  Administered 2021-08-27: 2 mg via INTRAVENOUS

## 2021-08-27 MED ORDER — LACTATED RINGERS IV SOLN: INTRAVENOUS | Status: DC | PRN

## 2021-08-27 MED ORDER — CELECOXIB 200 MG PO CAPS
200.0000 mg | ORAL_CAPSULE | Freq: Once | ORAL | Status: AC
  Administered 2021-08-27: 200 mg via ORAL
  Filled 2021-08-27: qty 1

## 2021-08-27 MED ORDER — ONDANSETRON HCL 4 MG/2ML IJ SOLN
INTRAMUSCULAR | Status: DC | PRN
  Administered 2021-08-27: 4 mg via INTRAVENOUS

## 2021-08-27 MED ORDER — ROPIVACAINE HCL 5 MG/ML IJ SOLN
INTRAMUSCULAR | Status: DC | PRN
  Administered 2021-08-27: 10 mL via PERINEURAL
  Administered 2021-08-27: 5 mL via PERINEURAL

## 2021-08-27 MED ORDER — 0.9 % SODIUM CHLORIDE (POUR BTL) OPTIME
TOPICAL | Status: DC | PRN
  Administered 2021-08-27: 1000 mL

## 2021-08-27 MED ORDER — FENTANYL CITRATE (PF) 250 MCG/5ML IJ SOLN
INTRAMUSCULAR | Status: AC
  Filled 2021-08-27: qty 5

## 2021-08-27 MED ORDER — ACETAMINOPHEN 500 MG PO TABS
1000.0000 mg | ORAL_TABLET | Freq: Once | ORAL | Status: AC
  Administered 2021-08-27: 1000 mg via ORAL
  Filled 2021-08-27: qty 2

## 2021-08-27 MED ORDER — AMISULPRIDE (ANTIEMETIC) 5 MG/2ML IV SOLN: 10.0000 mg | Freq: Once | INTRAVENOUS | Status: DC | PRN

## 2021-08-27 MED ORDER — FENTANYL CITRATE (PF) 100 MCG/2ML IJ SOLN
25.0000 ug | INTRAMUSCULAR | Status: DC | PRN
  Administered 2021-08-27: 25 ug via INTRAVENOUS

## 2021-08-27 MED ORDER — FENTANYL CITRATE (PF) 100 MCG/2ML IJ SOLN: 100.0000 ug | Freq: Once | INTRAMUSCULAR | Status: AC

## 2021-08-27 MED ORDER — MIDAZOLAM HCL 2 MG/2ML IJ SOLN
INTRAMUSCULAR | Status: AC
  Filled 2021-08-27: qty 2

## 2021-08-27 MED ORDER — PROPOFOL 10 MG/ML IV BOLUS
INTRAVENOUS | Status: DC | PRN
Start: 2021-08-27 — End: 2021-08-27
  Administered 2021-08-27: 200 mg via INTRAVENOUS
  Administered 2021-08-27: 90 mg via INTRAVENOUS

## 2021-08-27 MED ORDER — DEXTROSE 5 % IV SOLN
INTRAVENOUS | Status: DC | PRN
  Administered 2021-08-27: 3 g via INTRAVENOUS

## 2021-08-27 MED ORDER — FENTANYL CITRATE (PF) 250 MCG/5ML IJ SOLN
INTRAMUSCULAR | Status: DC | PRN
  Administered 2021-08-27 (×2): 50 ug via INTRAVENOUS

## 2021-08-27 MED ORDER — MIDAZOLAM HCL 2 MG/2ML IJ SOLN: 2.0000 mg | Freq: Once | INTRAMUSCULAR | Status: AC

## 2021-08-27 MED ORDER — FENTANYL CITRATE (PF) 100 MCG/2ML IJ SOLN
INTRAMUSCULAR | Status: AC
  Filled 2021-08-27: qty 2

## 2021-08-27 MED ORDER — MIDAZOLAM HCL 2 MG/2ML IJ SOLN
INTRAMUSCULAR | Status: AC
  Administered 2021-08-27: 2 mg via INTRAVENOUS
  Filled 2021-08-27: qty 2

## 2021-08-27 MED ORDER — CHLORHEXIDINE GLUCONATE 0.12 % MT SOLN: 15.0000 mL | Freq: Once | OROMUCOSAL | Status: AC

## 2021-08-27 MED ORDER — DEXAMETHASONE SODIUM PHOSPHATE 10 MG/ML IJ SOLN
INTRAMUSCULAR | Status: DC | PRN
  Administered 2021-08-27: 5 mg
  Administered 2021-08-27: 8 mg

## 2021-08-27 MED ORDER — ORAL CARE MOUTH RINSE: 15.0000 mL | Freq: Once | OROMUCOSAL | Status: AC

## 2021-08-27 MED ORDER — FENTANYL CITRATE (PF) 100 MCG/2ML IJ SOLN
INTRAMUSCULAR | Status: AC
  Administered 2021-08-27: 100 ug via INTRAVENOUS
  Filled 2021-08-27: qty 2

## 2021-08-27 MED ORDER — OXYCODONE-ACETAMINOPHEN 5-325 MG PO TABS: 1.0000 | ORAL_TABLET | ORAL | 0 refills | Status: DC | PRN

## 2021-08-27 MED ORDER — LACTATED RINGERS IV SOLN: INTRAVENOUS | Status: DC

## 2021-08-27 MED ORDER — CHLORHEXIDINE GLUCONATE 0.12 % MT SOLN
OROMUCOSAL | Status: AC
Start: 2021-08-27 — End: 2021-08-27
  Administered 2021-08-27: 15 mL via OROMUCOSAL
  Filled 2021-08-27: qty 15

## 2021-08-27 SURGICAL SUPPLY — 50 items
BAG COUNTER SPONGE SURGICOUNT (BAG) ×2 IMPLANT
BANDAGE ESMARK 6X9 LF (GAUZE/BANDAGES/DRESSINGS) ×1 IMPLANT
BIT DRILL LONG 3.1X160 (DRILL) IMPLANT
BIT DRILL SOLID LONG 2.8X160 (DRILL) IMPLANT
BLADE AVERAGE 25X9 (BLADE) ×2 IMPLANT
BLADE SAW SGTL 83.5X18.5 (BLADE) ×1 IMPLANT
BLADE SAW SGTL MED 73X18.5 STR (BLADE) ×2 IMPLANT
BLADE SURG 10 STRL SS (BLADE) IMPLANT
BNDG COHESIVE 4X5 TAN ST LF (GAUZE/BANDAGES/DRESSINGS) ×1 IMPLANT
BNDG COHESIVE 4X5 TAN STRL (GAUZE/BANDAGES/DRESSINGS) ×2 IMPLANT
BNDG ESMARK 6X9 LF (GAUZE/BANDAGES/DRESSINGS) ×2
BNDG GAUZE ELAST 4 BULKY (GAUZE/BANDAGES/DRESSINGS) ×3 IMPLANT
COVER MAYO STAND STRL (DRAPES) IMPLANT
COVER SURGICAL LIGHT HANDLE (MISCELLANEOUS) ×4 IMPLANT
DRAPE OEC MINIVIEW 54X84 (DRAPES) IMPLANT
DRAPE U-SHAPE 47X51 STRL (DRAPES) ×2 IMPLANT
DRILL LONG 3.1X160 (DRILL) ×2
DRILL SOLID LONG 2.8X160 (DRILL) ×2
DRSG ADAPTIC 3X8 NADH LF (GAUZE/BANDAGES/DRESSINGS) ×2 IMPLANT
DRSG PAD ABDOMINAL 8X10 ST (GAUZE/BANDAGES/DRESSINGS) ×1 IMPLANT
DURAPREP 26ML APPLICATOR (WOUND CARE) ×2 IMPLANT
ELECT REM PT RETURN 9FT ADLT (ELECTROSURGICAL) ×2
ELECTRODE REM PT RTRN 9FT ADLT (ELECTROSURGICAL) ×1 IMPLANT
GAUZE SPONGE 4X4 12PLY STRL (GAUZE/BANDAGES/DRESSINGS) ×2 IMPLANT
GLOVE BIOGEL PI IND STRL 9 (GLOVE) ×1 IMPLANT
GLOVE BIOGEL PI INDICATOR 9 (GLOVE) ×1
GLOVE SURG ORTHO 9.0 STRL STRW (GLOVE) ×2 IMPLANT
GOWN STRL REUS W/ TWL LRG LVL3 (GOWN DISPOSABLE) ×1 IMPLANT
GOWN STRL REUS W/ TWL XL LVL3 (GOWN DISPOSABLE) ×1 IMPLANT
GOWN STRL REUS W/TWL LRG LVL3 (GOWN DISPOSABLE) ×1
GOWN STRL REUS W/TWL XL LVL3 (GOWN DISPOSABLE) ×1
KIT BASIN OR (CUSTOM PROCEDURE TRAY) ×2 IMPLANT
KIT TURNOVER KIT B (KITS) ×2 IMPLANT
NS IRRIG 1000ML POUR BTL (IV SOLUTION) ×2 IMPLANT
PACK ORTHO EXTREMITY (CUSTOM PROCEDURE TRAY) ×2 IMPLANT
PAD ARMBOARD 7.5X6 YLW CONV (MISCELLANEOUS) ×4 IMPLANT
PLATE ANT TT REV RT (Plate) ×1 IMPLANT
SCREW LOCK PLATE R3 4.2X24 (Screw) ×2 IMPLANT
SCREW NLOCK PLATE SB 4.5X30 (Screw) ×1 IMPLANT
SCREW NLOCK PLATE SB 4.5X32 (Screw) ×1 IMPLANT
SCREW NONLOCK 4.5X28 (Screw) ×1 IMPLANT
SCREW NONLOCK PLATE R3 4.2X24 (Screw) ×2 IMPLANT
SPONGE T-LAP 18X18 ~~LOC~~+RFID (SPONGE) ×2 IMPLANT
SUCTION FRAZIER HANDLE 10FR (MISCELLANEOUS) ×1
SUCTION TUBE FRAZIER 10FR DISP (MISCELLANEOUS) ×1 IMPLANT
SUT ETHILON 2 0 PSLX (SUTURE) ×6 IMPLANT
TOWEL GREEN STERILE (TOWEL DISPOSABLE) ×2 IMPLANT
TOWEL GREEN STERILE FF (TOWEL DISPOSABLE) ×2 IMPLANT
TUBE CONNECTING 12X1/4 (SUCTIONS) ×2 IMPLANT
WATER STERILE IRR 1000ML POUR (IV SOLUTION) ×2 IMPLANT

## 2021-08-27 NOTE — Interval H&P Note (Signed)
History and Physical Interval Note: ? ?08/27/2021 ?9:51 AM ? ?Taylor [name]  has presented today for surgery, with the diagnosis of RIGHT ANKLE FRACTURE / DISLOCATION.  The various methods of treatment have been discussed with the patient and family. After consideration of risks, benefits and other options for treatment, the patient has consented to  Procedure(s): ?RIGHT ANKLE FUSION (Right) as a surgical intervention.  The patient's history has been reviewed, patient examined, no change in status, stable for surgery.  I have reviewed the patient's chart and labs.  Questions were answered to the patient's satisfaction.   ? ? ?Newt Minion ? ? ?

## 2021-08-27 NOTE — Anesthesia Procedure Notes (Signed)
Procedure Name: LMA Insertion ?Date/Time: 08/27/2021 11:17 AM ?Performed by: Leonor Liv, CRNA ?Pre-anesthesia Checklist: Patient identified, Emergency Drugs available, Suction available and Patient being monitored ?Patient Re-evaluated:Patient Re-evaluated prior to induction ?Oxygen Delivery Method: Circle System Utilized ?Preoxygenation: Pre-oxygenation with 100% oxygen ?Induction Type: IV induction ?Ventilation: Mask ventilation without difficulty ?LMA: LMA with gastric port inserted ?LMA Size: 4.0 ?Number of attempts: 1 ?Airway Equipment and Method: Bite block ?Placement Confirmation: positive ETCO2 ?Tube secured with: Tape ?Dental Injury: Teeth and Oropharynx as per pre-operative assessment  ?Comments: Hx chipped front teeth, poor dentition.  ? ? ? ? ?
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
## 2021-08-27 NOTE — Transfer of Care (Signed)
Immediate Anesthesia Transfer of Care Note ? ?Patient: Taylor [name] ? ?Procedure(s) Performed: RIGHT ANKLE FUSION (Right: Ankle) ? ?Patient Location: PACU ? ?Anesthesia Type:General ? ?Level of Consciousness: awake, alert  and oriented ? ?Airway & Oxygen Therapy: Patient Spontanous Breathing and Patient connected to face mask oxygen ? ?Post-op Assessment: Report given to RN and Post -op Vital signs reviewed and stable ? ?Post vital signs: Reviewed and stable ? ?Last Vitals:  ?Vitals Value Taken Time  ?BP 132/78 08/27/21 1230  ?Temp    ?Pulse 96 08/27/21 1231  ?Resp 25 08/27/21 1231  ?SpO2 98 % 08/27/21 1231  ?Vitals shown include unvalidated device data. ? ?Last Pain:  ?Vitals:  ? 08/27/21 0922  ?TempSrc:   ?PainSc: 5   ?   ? ?Patients Stated Pain Goal: 0 (08/27/21 3976) ? ?Complications: No notable events documented. ?

## 2021-08-27 NOTE — Anesthesia Procedure Notes (Signed)
Anesthesia Regional Block: Popliteal block  ? ?Pre-Anesthetic Checklist: , timeout performed,  Correct Patient, Correct Site, Correct Laterality,  Correct Procedure, Correct Position, site marked,  Risks and benefits discussed,  Surgical consent,  Pre-op evaluation,  At surgeon's request and post-op pain management ? ?Laterality: Right ? ?Prep: chloraprep     ?  ?Needles:  ?Injection technique: Single-shot ? ?Needle Type: Echogenic Stimulator Needle   ? ? ? ? ? ? ? ?Additional Needles: ? ? ?Procedures:,,,, ultrasound used (permanent image in chart),,    ?Narrative:  ?Start time: 08/27/2021 10:11 AM ?End time: 08/27/2021 10:21 AM ?Injection made incrementally with aspirations every 5 mL. ? ?Performed by: Personally  ?Anesthesiologist: Duane Boston, MD ? ?Additional Notes: ?A functioning IV was confirmed and monitors were applied.  Sterile prep and drape, hand hygiene and sterile gloves were used.  Negative aspiration and test dose prior to incremental administration of local anesthetic. The patient tolerated the procedure well.Ultrasound  guidance: relevant anatomy identified, needle position confirmed, local anesthetic spread visualized around nerve(s), vascular puncture avoided.  Image printed for medical record.  ? ? ? ? ?

## 2021-08-27 NOTE — Op Note (Signed)
08/27/2021 ? ?12:34 PM ? ?PATIENT:  Taylor [name]   ? ?PRE-OPERATIVE DIAGNOSIS:  RIGHT ANKLE FRACTURE / DISLOCATION ? ?POST-OPERATIVE DIAGNOSIS:  Same ? ?PROCEDURE:  RIGHT ANKLE FUSION ?C arm fluoroscopy to verify reduction. ? ?SURGEON:  Newt Minion, MD ? ?PHYSICIAN ASSISTANT:None ?ANESTHESIA:   General ? ?PREOPERATIVE INDICATIONS:  [name] is a  31 y.o. female with a diagnosis of RIGHT ANKLE FRACTURE / DISLOCATION who failed conservative measures and elected for surgical management.   ? ?The risks benefits and alternatives were discussed with the patient preoperatively including but not limited to the risks of infection, bleeding, nerve injury, cardiopulmonary complications, the need for revision surgery, among others, and the patient was willing to proceed. ? ?OPERATIVE IMPLANTS: Anterior Biomet fusion plate ? ?'@ENCIMAGES'$ @ ? ?OPERATIVE FINDINGS: C-arm possibly verified reduction in both AP and lateral planes.  Ankle at 90 degrees. ? ?OPERATIVE PROCEDURE: Patient was brought the operating room after undergoing a regional anesthetic.  She does not want a general anesthetic.  After adequate levels anesthesia were obtained patient's right lower extremity was prepped using DuraPrep draped into a sterile field a timeout was called.  A dorsal longitudinal incision was made between the EHL and anterior tibial tendon.  Blunt dissection was carried down to the anterior tibial tendon sheath this was then sized and the incision was carried down to the capsule through the posterior aspect of the anterior tibial tendon.  Visualization showed varus collapse of the tibial talar joint with persistent lucent line through the pilon fracture.  Using a oscillating saw the distal tibia and talus were resected perpendicular to the long axis of the tibia.  The gutters were cleansed.  A curette was used to further debride the tibial talar joint.  The joint was reduced and the subluxation was corrected.  2 screws were  placed in the talar head with the joint reduced.  A compression screw was then placed proximally and the joint compressed.  A total of 3 compression screws were placed proximally 2 compression screws distally and 2 locking screws distally.  See arthroscopy in both AP and lateral planes verified reduction of the tibial talar joint.  The wound was irrigated normal saline incision closed using 2-0 nylon a sterile dressing was applied patient was extubated taken the PACU in stable condition. ? ? ?DISCHARGE PLANNING: ? ?Antibiotic duration: Preoperative antibiotics ? ?Weightbearing: Touchdown weightbearing on the right ? ?Pain medication: Prescription for Percocet  ? ?Dressing care/ Wound VAC: Dry dressing ? ?Ambulatory devices: Walker or crutches ? ?Discharge to: Home. ? ?Follow-up: In the office 1 week post operative. ? ? ? ? ? ? ?  ?

## 2021-08-27 NOTE — Progress Notes (Signed)
Wasted 6mg of Fentanyl with witness AJanus Molder RN.  ?

## 2021-08-27 NOTE — Anesthesia Postprocedure Evaluation (Signed)
Anesthesia Post Note ? ?Patient: Taylor [name] ? ?Procedure(s) Performed: RIGHT ANKLE FUSION (Right: Ankle) ? ?  ? ?Patient location during evaluation: PACU ?Anesthesia Type: General ?Level of consciousness: sedated ?Pain management: pain level controlled ?Vital Signs Assessment: post-procedure vital signs reviewed and stable ?Respiratory status: spontaneous breathing and respiratory function stable ?Cardiovascular status: stable ?Postop Assessment: no apparent nausea or vomiting ?Anesthetic complications: no ? ? ?No notable events documented. ? ?Last Vitals:  ?Vitals:  ? 08/27/21 1300 08/27/21 1315  ?BP: 137/89 (!) 128/93  ?Pulse: 90 92  ?Resp: 15 19  ?Temp:    ?SpO2: 99% 99%  ?  ?Last Pain:  ?Vitals:  ? 08/27/21 1315  ?TempSrc:   ?PainSc: 5   ? ? ?  ?  ?  ?  ?  ?  ? ?Denya Buckingham DANIEL ? ? ? ? ?

## 2021-08-27 NOTE — Progress Notes (Signed)
Orthopedic Tech Progress Note ?Patient Details:  ?Taylor [name] ?04-02-1991 ?[id number] ? ?Ortho Devices ?Type of Ortho Device: Crutches, CAM walker ?Ortho Device/Splint Interventions: Ordered, Application ?  ?Post Interventions ?Patient Tolerated: Well ? ?[name] A Suzzane Quilter ?08/27/2021, 2:21 PM ? ?

## 2021-08-28 ENCOUNTER — Encounter (HOSPITAL_COMMUNITY): Payer: Self-pay | Admitting: Orthopedic Surgery

## 2021-09-01 ENCOUNTER — Telehealth: Payer: Self-pay | Admitting: Orthopedic Surgery

## 2021-09-01 NOTE — Telephone Encounter (Signed)
Pt is calling to make an appt for this wed  4-26 for post op  ? ? ?She is expressing that she has lots of swelling  ?Please call  ?

## 2021-09-01 NOTE — Telephone Encounter (Signed)
She is post op ankle fusion can you please call and make an appt for today or tomorrow with Dr. Sharol Given? I can open a spot if you give me a time please.  ?

## 2021-09-01 NOTE — Telephone Encounter (Signed)
Appt made tomorrow at 130 ?

## 2021-09-02 ENCOUNTER — Ambulatory Visit (INDEPENDENT_AMBULATORY_CARE_PROVIDER_SITE_OTHER): Payer: Medicaid Other | Admitting: Orthopedic Surgery

## 2021-09-02 DIAGNOSIS — S82871A Displaced pilon fracture of right tibia, initial encounter for closed fracture: Secondary | ICD-10-CM

## 2021-09-03 ENCOUNTER — Encounter: Payer: Self-pay | Admitting: Orthopedic Surgery
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
## 2021-09-16 ENCOUNTER — Ambulatory Visit (INDEPENDENT_AMBULATORY_CARE_PROVIDER_SITE_OTHER): Payer: Medicaid Other

## 2021-09-16 ENCOUNTER — Ambulatory Visit: Payer: Medicaid Other | Admitting: Gastroenterology

## 2021-09-16 ENCOUNTER — Ambulatory Visit (INDEPENDENT_AMBULATORY_CARE_PROVIDER_SITE_OTHER): Payer: Medicaid Other | Admitting: Orthopedic Surgery

## 2021-09-16 ENCOUNTER — Encounter: Payer: Self-pay | Admitting: Gastroenterology

## 2021-09-16 VITALS — BP 108/80 | HR 92 | Temp 97.7°F | Ht 64.0 in

## 2021-09-16 DIAGNOSIS — Z981 Arthrodesis status: Secondary | ICD-10-CM

## 2021-09-16 DIAGNOSIS — M25572 Pain in left ankle and joints of left foot: Secondary | ICD-10-CM | POA: Diagnosis not present

## 2021-09-16 DIAGNOSIS — S9304XS Dislocation of right ankle joint, sequela: Secondary | ICD-10-CM

## 2021-09-16 DIAGNOSIS — K641 Second degree hemorrhoids: Secondary | ICD-10-CM | POA: Diagnosis not present

## 2021-09-16 NOTE — Patient Instructions (Signed)
Please continue to avoid straining. ? ?You should limit your toilet time to 2-3 minutes at the most.  ? ?Continue to avoid constipation. You can take Miralax daily in 8 ounces of water to help with constipation.  ? ?Please call me with any concerns or issues! ? ?I will see you in follow-up for additional banding in several weeks. ? ? ? ? ?It was a pleasure to see you today. I want to create trusting relationships with patients to provide genuine, compassionate, and quality care. I value your feedback. If you receive a survey regarding your visit,  I greatly appreciate you taking time to fill this out.  ? ?Annitta Needs, PhD, ANP-BC ?[facility]  ? ? ?

## 2021-09-16 NOTE — Progress Notes (Signed)
? ? ? ? ?  [place] BANDING PROCEDURE NOTE ? ?Taylor [name] is a 31 y.o. female presenting today for consideration of hemorrhoid banding. Last colonoscopy Aug 2022 with non-bleeding internal hemorrhoids, small rectal lipoma, lymphocytic colitis. She was treated with budesonide. Actually dealing with constipation now. Symptoms including rectal bleeding, pressure, prolapse.  ? ? ?The patient presents with symptomatic grade 2 hemorrhoids, unresponsive to maximal medical therapy, requesting rubber band ligation of her hemorrhoidal disease. All risks, benefits, and alternative forms of therapy were described and informed consent was obtained. ? ? ?The decision was made to band the left lateral internal hemorrhoid, and the [place] was used to perform band ligation without complication. Digital anorectal examination was then performed to assure proper positioning of the band, and to adjust the banded tissue as required. The patient was discharged home without pain or other issues. Dietary and behavioral recommendations were given, along with follow-up instructions. The patient will return in several weeks for followup and possible additional banding as required. ? ?No complications were encountered and the patient tolerated the procedure well.  ? ?Annitta Needs, PhD, ANP-BC ?[facility]  ? ?

## 2021-09-30 ENCOUNTER — Ambulatory Visit: Payer: Medicaid Other | Admitting: Orthopedic Surgery

## 2021-10-02 ENCOUNTER — Ambulatory Visit (INDEPENDENT_AMBULATORY_CARE_PROVIDER_SITE_OTHER): Payer: Medicaid Other

## 2021-10-02 ENCOUNTER — Encounter: Payer: Medicaid Other | Admitting: Gastroenterology

## 2021-10-02 ENCOUNTER — Ambulatory Visit (INDEPENDENT_AMBULATORY_CARE_PROVIDER_SITE_OTHER): Payer: Medicaid Other | Admitting: Orthopedic Surgery

## 2021-10-02 DIAGNOSIS — Z981 Arthrodesis status: Secondary | ICD-10-CM

## 2021-10-05 ENCOUNTER — Encounter: Payer: Self-pay | Admitting: Orthopedic Surgery
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
## 2021-10-07 ENCOUNTER — Encounter: Payer: Medicaid Other | Admitting: Gastroenterology

## 2021-10-10 ENCOUNTER — Inpatient Hospital Stay (HOSPITAL_COMMUNITY): Payer: Medicaid Other | Attending: Hematology

## 2021-10-10 DIAGNOSIS — D72829 Elevated white blood cell count, unspecified: Secondary | ICD-10-CM | POA: Insufficient documentation

## 2021-10-10 DIAGNOSIS — F17210 Nicotine dependence, cigarettes, uncomplicated: Secondary | ICD-10-CM | POA: Insufficient documentation

## 2021-10-10 LAB — CBC WITH DIFFERENTIAL/PLATELET
Abs Immature Granulocytes: 0.01 10*3/uL (ref 0.00–0.07)
Basophils Absolute: 0.1 10*3/uL (ref 0.0–0.1)
Basophils Relative: 1 %
Eosinophils Absolute: 0.2 10*3/uL (ref 0.0–0.5)
Eosinophils Relative: 3 %
HCT: 35.2 % — ABNORMAL LOW (ref 36.0–46.0)
Hemoglobin: 11.9 g/dL — ABNORMAL LOW (ref 12.0–15.0)
Immature Granulocytes: 0 %
Lymphocytes Relative: 40 %
Lymphs Abs: 2.7 10*3/uL (ref 0.7–4.0)
MCH: 32.6 pg (ref 26.0–34.0)
MCHC: 33.8 g/dL (ref 30.0–36.0)
MCV: 96.4 fL (ref 80.0–100.0)
Monocytes Absolute: 0.5 10*3/uL (ref 0.1–1.0)
Monocytes Relative: 7 %
Neutro Abs: 3.3 10*3/uL (ref 1.7–7.7)
Neutrophils Relative %: 49 %
Platelets: 251 10*3/uL (ref 150–400)
RBC: 3.65 MIL/uL — ABNORMAL LOW (ref 3.87–5.11)
RDW: 11.9 % (ref 11.5–15.5)
WBC: 6.7 10*3/uL (ref 4.0–10.5)
nRBC: 0 % (ref 0.0–0.2)

## 2021-10-10 LAB — LACTATE DEHYDROGENASE: LDH: 175 U/L (ref 98–192)

## 2021-10-16 ENCOUNTER — Encounter: Payer: Medicaid Other | Admitting: Gastroenterology

## 2021-10-16 NOTE — Progress Notes (Signed)
Virtual Visit via Telephone Note [facility]  I connected with Taylor [name]  on 10/17/2021 at  1:40 PM by telephone and verified that I am speaking with the correct person using two identifiers.  Location: Patient: Home Provider: [facility]   I discussed the limitations, risks, security and privacy concerns of performing an evaluation and management service by telephone and the availability of in person appointments. I also discussed with the patient that there may be a patient responsible charge related to this service. The patient expressed understanding and agreed to proceed.  REASON FOR VISIT:  Follow-up for CALR+ leukocytosis   CURRENT THERAPY: Observation  INTERVAL HISTORY: Taylor [name] is contacted today for follow-up of her CALR + leukocytosis.  She was last seen by Casey Burkitt PA-C on 04/09/2021. At today's visit, she reports feeling somewhat poorly due to her other chronic complaints and comorbidities unrelated to today's visit. Regarding her history of leukocytosis, she denies any recent infections.  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  She has successfully quit smoking cigarettes, but is vaping instead.  She says that she knows that "vaping is just as bad for her," and she is going to work on quitting that next. She denies any B symptoms such as fever, chills, night sweats, unintentional weight loss.  She has not noticed any new lumps or bumps. She has very little energy and 50 % appetite. She endorses that she is maintaining a stable weight.    OBSERVATIONS/OBJECTIVE: Review of Systems  Constitutional:  Positive for malaise/fatigue. Negative for chills, diaphoresis, fever and weight loss.  Respiratory:  Positive for cough. Negative for shortness of breath.   Cardiovascular:  Positive for palpitations. Negative for chest pain.  Gastrointestinal:   Positive for diarrhea, nausea and vomiting. Negative for abdominal pain, blood in stool and melena.  Musculoskeletal:  Positive for joint pain.  Skin:  Positive for rash.  Neurological:  Positive for tingling, weakness (falls at home) and headaches. Negative for dizziness.  Psychiatric/Behavioral:  Positive for depression. The patient is nervous/anxious and has insomnia.      PHYSICAL EXAM (per limitations of virtual telephone visit): The patient is alert and oriented x 3, exhibiting adequate mentation, good mood, and ability to speak in full sentences and execute sound judgement.   ASSESSMENT & PLAN: 1.  CALR positive leukocytosis - Intermittent leukocytosis ranging from normal to 17.1 since 2020 - Laboratory work-up found patient to be CALR positive; no steroid use or known rheumatologic disorder.  BCR/ABL and JAK2 V617F -Leukocytosis is likely a combination of being CALR positive and continuing to smoke 1 PPD cigarettes - Recently finished prednisone for laryngitis - No B symptoms or lymphadenopathy - No hepatosplenomegaly or lymphadenopathy on exam. - Labs today (10/10/2021): WBC 6.7 with normal differential, LDH normal. - PLAN:  No issues at this time. -- Same-day labs and office visit in 1 year  2.  Tobacco use - Discussed importance of smoking cessation with the patient - Patient acknowledges need to quit smoking, but feels that there is too much external stress in her life at this time -We will discuss this again at her follow-up visit   3.  Family history: - Maternal grandmother had ITP and died of acute leukemia. - Maternal uncle had ITP. - Mother had floor of the mouth cancer.   FOLLOW UP INSTRUCTIONS: - Labs and office visit in 1 year -  Patient instructed to follow-up with her PCP for her multiple other issues that are unrelated to her hematologic visit, including her right hand pain, left hip pain, and cough.    I discussed the assessment and treatment plan with the  patient. The patient was provided an opportunity to ask questions and all were answered. The patient agreed with the plan and demonstrated an understanding of the instructions.   The patient was advised to call back or seek an in-person evaluation if the symptoms worsen or if the condition fails to improve as anticipated.  I provided 13 minutes of non-face-to-face time during this encounter.   Harriett Rush, PA-C 10/17/21 1:54 PM

## 2021-10-17 ENCOUNTER — Inpatient Hospital Stay (HOSPITAL_BASED_OUTPATIENT_CLINIC_OR_DEPARTMENT_OTHER): Payer: Medicaid Other | Admitting: Physician Assistant

## 2021-10-17 DIAGNOSIS — D72829 Elevated white blood cell count, unspecified: Secondary | ICD-10-CM

## 2021-10-17 DIAGNOSIS — F411 Generalized anxiety disorder: Secondary | ICD-10-CM | POA: Diagnosis not present

## 2021-10-17 DIAGNOSIS — F4310 Post-traumatic stress disorder, unspecified: Secondary | ICD-10-CM | POA: Diagnosis not present

## 2021-10-17 DIAGNOSIS — F3132 Bipolar disorder, current episode depressed, moderate: Secondary | ICD-10-CM | POA: Diagnosis not present

## 2021-10-19 ENCOUNTER — Encounter: Payer: Self-pay | Admitting: Orthopedic Surgery
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
## 2021-10-21 ENCOUNTER — Telehealth: Payer: Self-pay

## 2021-10-21 DIAGNOSIS — K219 Gastro-esophageal reflux disease without esophagitis: Secondary | ICD-10-CM

## 2021-10-21 MED ORDER — OMEPRAZOLE 40 MG PO CPDR: 40.0000 mg | DELAYED_RELEASE_CAPSULE | Freq: Two times a day (BID) | ORAL | 5 refills | Status: DC

## 2021-10-21 NOTE — Telephone Encounter (Signed)
Refill request received for Omeprazole 40 mg capsules qty: 60 to be sent to [facility]. Pt's last ov was 09/16/2021.

## 2021-10-21 NOTE — Addendum Note (Signed)
Addended by: Annitta Needs on: 10/21/2021 04:10 PM   Modules accepted: Orders

## 2021-10-21 NOTE — Telephone Encounter (Signed)
Completed.

## 2021-10-23 ENCOUNTER — Ambulatory Visit: Payer: Medicaid Other | Admitting: Orthopedic Surgery

## 2021-10-23 ENCOUNTER — Ambulatory Visit (INDEPENDENT_AMBULATORY_CARE_PROVIDER_SITE_OTHER): Payer: Medicaid Other

## 2021-10-23 DIAGNOSIS — M25571 Pain in right ankle and joints of right foot: Secondary | ICD-10-CM

## 2021-10-23 DIAGNOSIS — Z981 Arthrodesis status: Secondary | ICD-10-CM

## 2021-10-26 ENCOUNTER — Encounter: Payer: Self-pay | Admitting: Orthopedic Surgery
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
## 2021-11-24 ENCOUNTER — Ambulatory Visit: Payer: Medicaid Other | Admitting: Orthopedic Surgery

## 2021-11-24 ENCOUNTER — Ambulatory Visit (INDEPENDENT_AMBULATORY_CARE_PROVIDER_SITE_OTHER): Payer: Medicaid Other

## 2021-11-24 DIAGNOSIS — Z981 Arthrodesis status: Secondary | ICD-10-CM | POA: Diagnosis not present

## 2021-11-25 DIAGNOSIS — M79604 Pain in right leg: Secondary | ICD-10-CM | POA: Diagnosis not present

## 2021-11-25 DIAGNOSIS — M79671 Pain in right foot: Secondary | ICD-10-CM | POA: Diagnosis not present

## 2021-11-25 DIAGNOSIS — Z79891 Long term (current) use of opiate analgesic: Secondary | ICD-10-CM | POA: Diagnosis not present

## 2021-11-25 DIAGNOSIS — M79672 Pain in left foot: Secondary | ICD-10-CM | POA: Diagnosis not present

## 2021-12-08 ENCOUNTER — Encounter: Payer: Self-pay | Admitting: Orthopedic Surgery
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
## 2021-12-25 ENCOUNTER — Ambulatory Visit: Payer: Medicaid Other | Admitting: Orthopedic Surgery

## 2021-12-27 IMAGING — MR MR PELVIS WO/W CM
6 of 8 series · 33 of 48 positions shown · IV contrast (gadavist)
Comparison: MRI lumbar spine 08/27/2019

CLINICAL DATA: Right-sided pelvic pain.

EXAM:
MRI PELVIS WITHOUT AND WITH CONTRAST
TECHNIQUE: Multiplanar multisequence MR imaging of the pelvis was performed
both before and after administration of intravenous contrast.
CONTRAST:  10mL GADAVIST GADOBUTROL 1 MMOL/ML IV SOLN

[Series 9: T1 · axial · 4.0mm · 0.74mm/px · z∈[-115,+130]mm · 7 of 50 slices shown (1 of 2)]
[im 1/50]
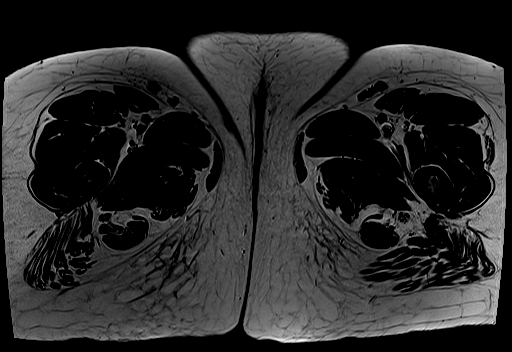
[im 9/50]
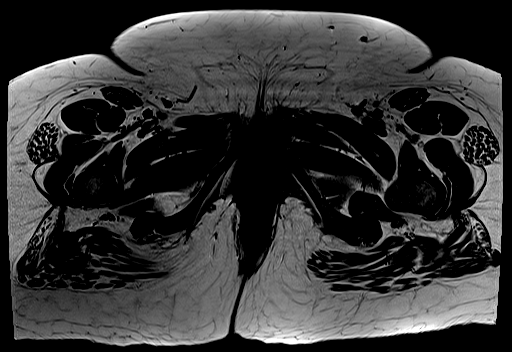
[im 17/50]
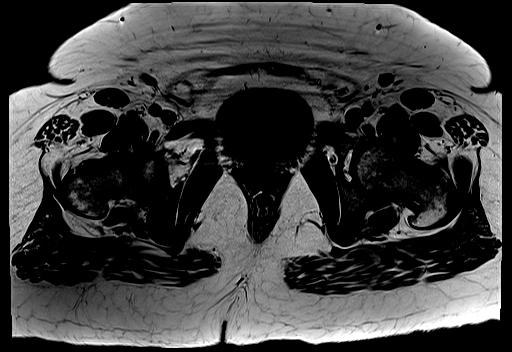
[im 25/50]
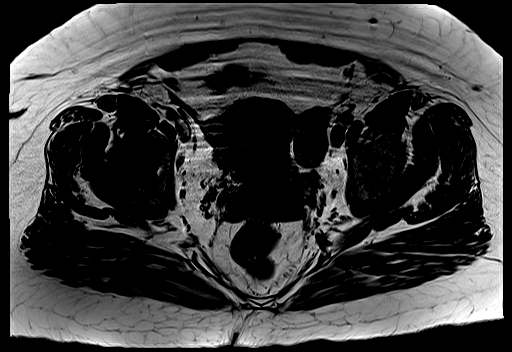
[im 33/50]
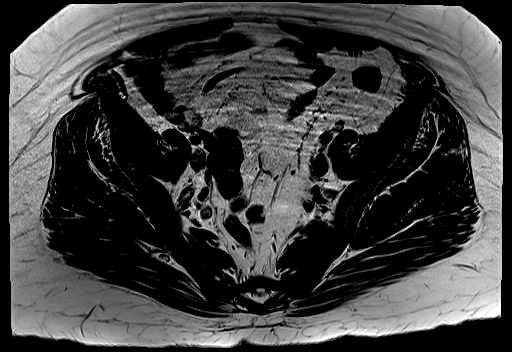
[im 41/50]
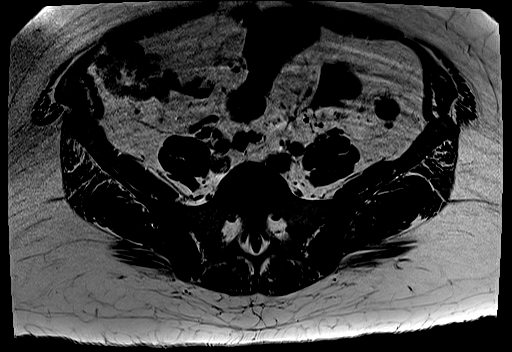
[im 50/50]
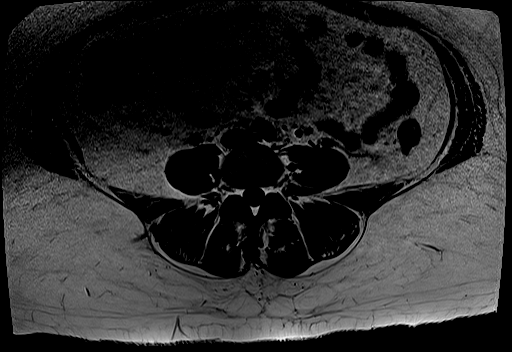

[Series 10: STIR · coronal · 4.0mm · 1.04mm/px · 5 of 40 slices shown]
[im 1/40]
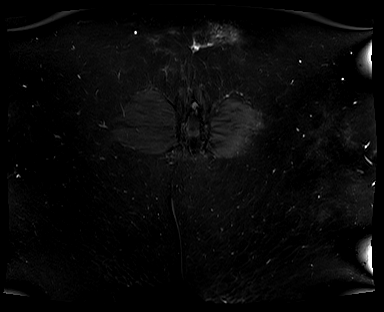
[im 10/40]
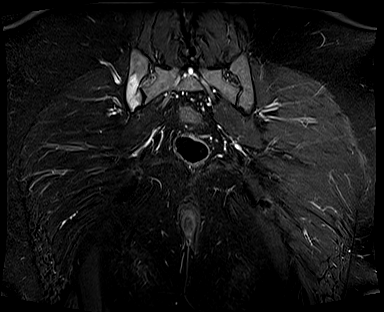
[im 20/40]
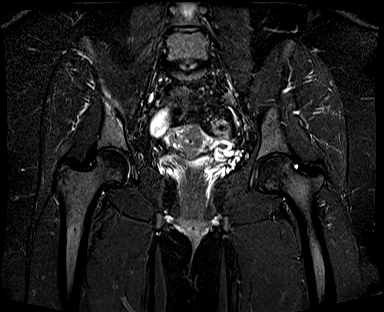
[im 30/40]
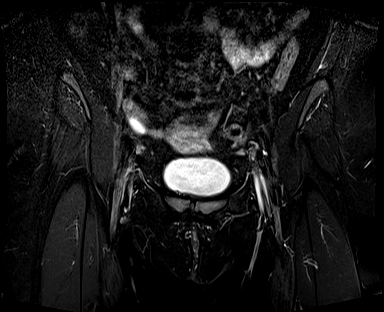
[im 40/40]
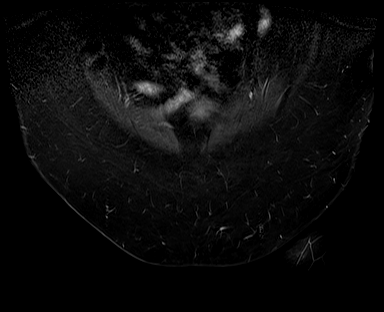

[Series 11: T1 · coronal · 4.0mm · 1.04mm/px · 5 of 40 slices shown (2 of 2)]
[im 1/40]
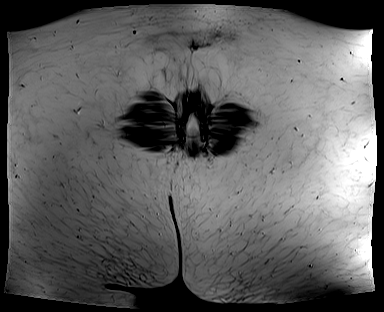
[im 10/40]
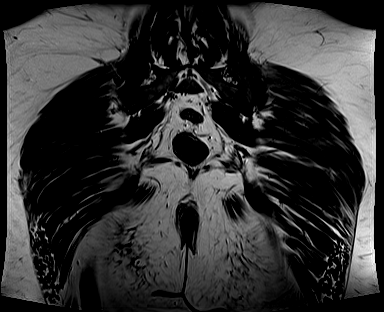
[im 20/40]
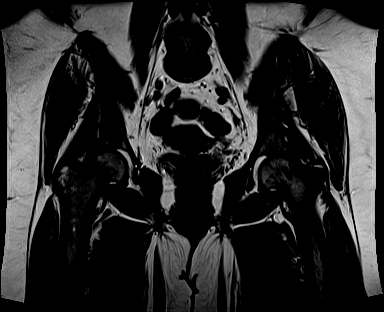
[im 30/40]
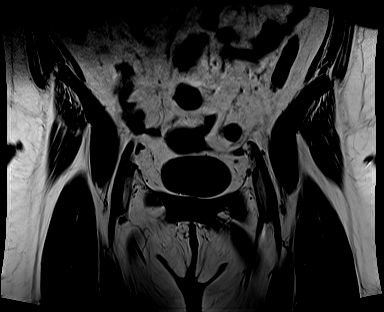
[im 40/40]
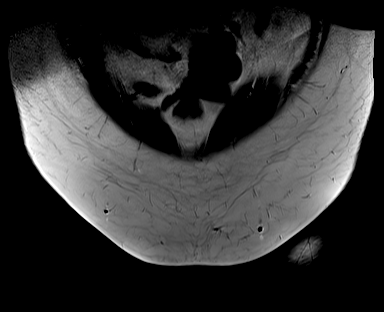

[Series 12: T2 fat-sat · axial · 4.0mm · 0.74mm/px · z∈[-115,+130]mm · 7 of 50 slices shown (1 of 2)]
[im 1/50]
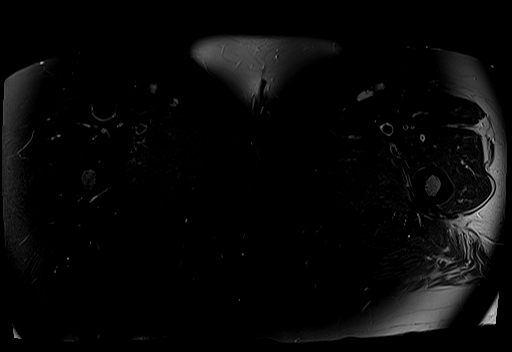
[im 9/50]
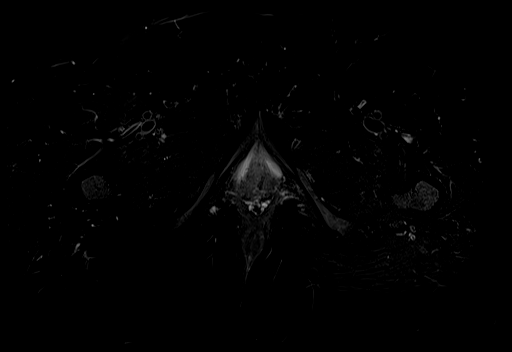
[im 17/50]
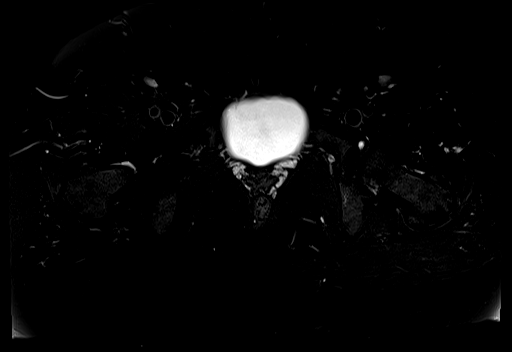
[im 25/50]
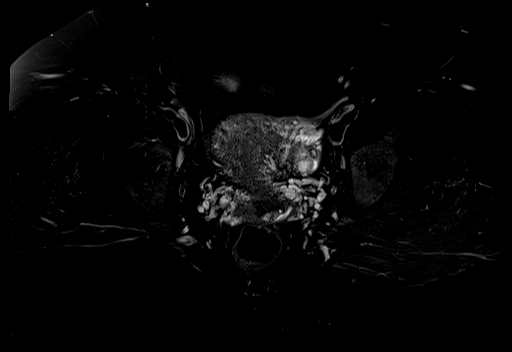
[im 33/50]
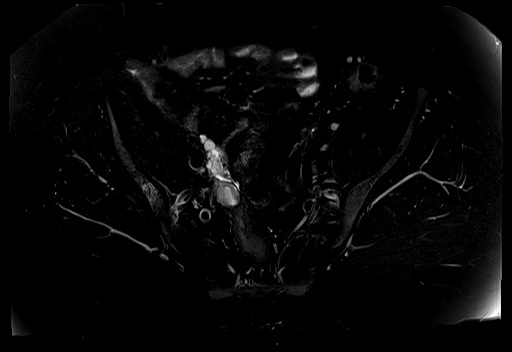
[im 41/50]
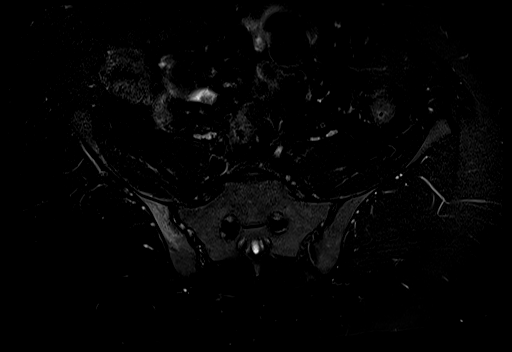
[im 50/50]
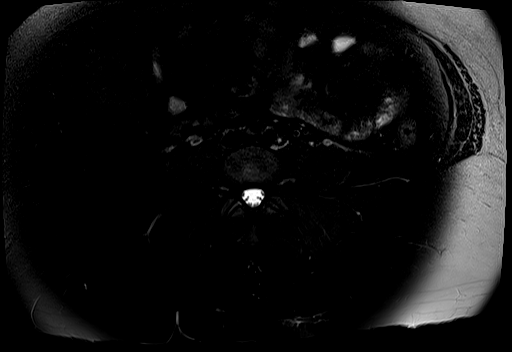

[Series 13: T2 fat-sat · sagittal · 4.0mm · 0.85mm/px · 5 of 40 slices shown (2 of 2)]
[im 1/40]
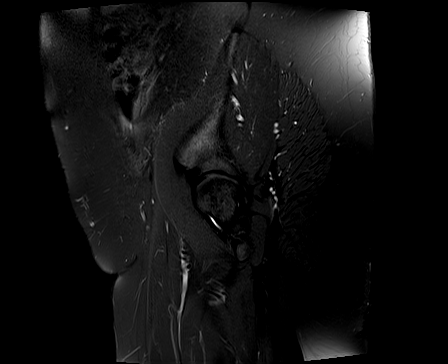
[im 10/40]
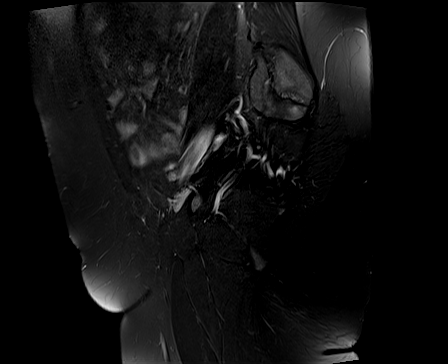
[im 20/40]
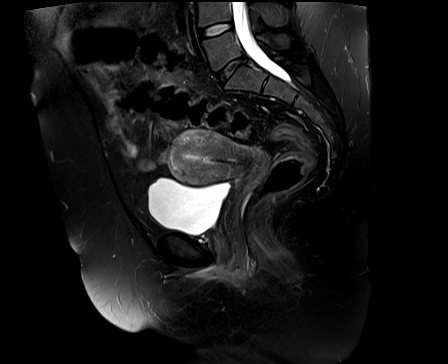
[im 30/40]
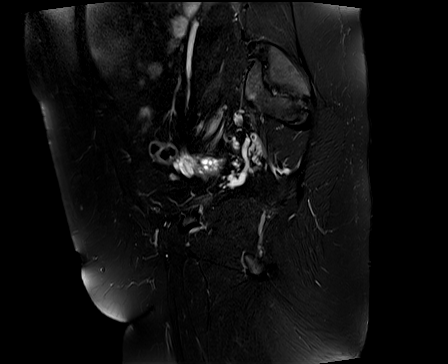
[im 40/40]
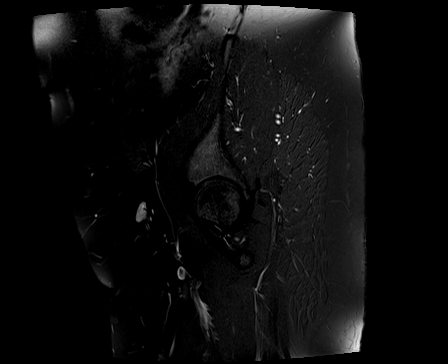

[Series 14: T1 fat-sat post-contrast · axial · non-contrast · 4.0mm · 0.99mm/px · z∈[-115,+5]mm · 4 of 50 slices shown]
[im 1/50]
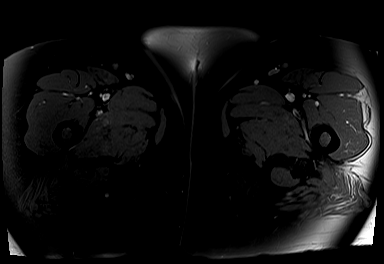
[im 9/50]
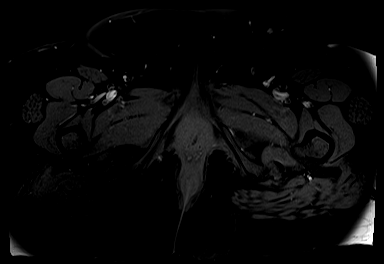
[im 17/50]
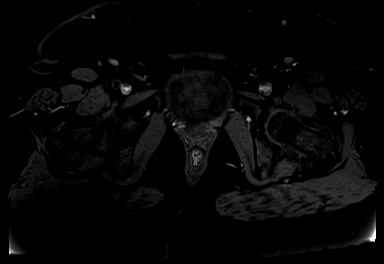
[im 25/50]
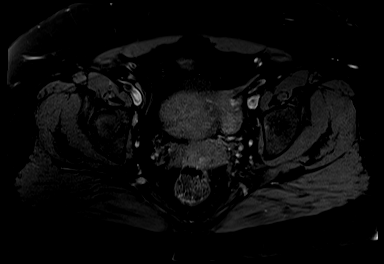

[33 of 48 positions shown; findings below may reference images not displayed]

FINDINGS: Urinary Tract:  The bladder is unremarkable.

Bowel: The rectum, sigmoid colon and visualized small-bowel loops
are unremarkable.

Vascular/Lymphatic: Normal vascular structures. No adenopathy.

Reproductive: The uterus and ovaries are unremarkable. An IUD is
noted in the endometrial canal.

Other: No free pelvic fluid collections. No inguinal mass or
adenopathy.

Musculoskeletal: There is a nondisplaced fracture involving the
right iliac bone with surrounding marrow edema. Some reactive fluid
in the right SI joint. I suspect there is also a very small
nondisplaced fracture involving the left sacrum anteriorly. The
pubic symphysis and SI joints are intact. No pubic rami fractures
are identified.

Both hips are normally located. No hip fracture or AVN.
IMPRESSION: 1. Nondisplaced right iliac bone fracture with surrounding marrow
edema.
2. Possible very small nondisplaced fracture involving the left
sacrum anteriorly.
3. No hip fracture or AVN.

## 2022-01-05 ENCOUNTER — Ambulatory Visit: Payer: Medicaid Other | Admitting: Orthopedic Surgery

## 2022-01-05 ENCOUNTER — Ambulatory Visit (INDEPENDENT_AMBULATORY_CARE_PROVIDER_SITE_OTHER): Payer: Medicaid Other

## 2022-01-05 ENCOUNTER — Encounter: Payer: Self-pay | Admitting: Orthopedic Surgery

## 2022-01-05 DIAGNOSIS — M25571 Pain in right ankle and joints of right foot: Secondary | ICD-10-CM
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
## 2022-01-21 DIAGNOSIS — F319 Bipolar disorder, unspecified: Secondary | ICD-10-CM | POA: Diagnosis not present

## 2022-01-26 ENCOUNTER — Ambulatory Visit: Payer: Medicaid Other | Admitting: Orthopedic Surgery

## 2022-01-28 DIAGNOSIS — F4310 Post-traumatic stress disorder, unspecified: Secondary | ICD-10-CM | POA: Diagnosis not present

## 2022-01-28 DIAGNOSIS — F3132 Bipolar disorder, current episode depressed, moderate: Secondary | ICD-10-CM | POA: Diagnosis not present

## 2022-01-28 DIAGNOSIS — F411 Generalized anxiety disorder: Secondary | ICD-10-CM | POA: Diagnosis not present

## 2022-02-04 ENCOUNTER — Ambulatory Visit: Payer: Medicaid Other | Admitting: Family

## 2022-02-11 ENCOUNTER — Ambulatory Visit (INDEPENDENT_AMBULATORY_CARE_PROVIDER_SITE_OTHER): Payer: Medicaid Other | Admitting: Family

## 2022-02-11 DIAGNOSIS — Z981 Arthrodesis status: Secondary | ICD-10-CM | POA: Diagnosis not present

## 2022-02-19 ENCOUNTER — Ambulatory Visit: Payer: Medicaid Other | Admitting: Orthopaedic Surgery

## 2022-03-02 DIAGNOSIS — G894 Chronic pain syndrome: Secondary | ICD-10-CM | POA: Diagnosis not present

## 2022-03-05 ENCOUNTER — Ambulatory Visit: Payer: Medicaid Other | Admitting: Orthopaedic Surgery

## 2022-03-11 ENCOUNTER — Encounter: Payer: Self-pay | Admitting: Family

## 2022-03-11 DIAGNOSIS — Z981 Arthrodesis status: Secondary | ICD-10-CM | POA: Insufficient documentation
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
## 2022-03-18 DIAGNOSIS — F3132 Bipolar disorder, current episode depressed, moderate: Secondary | ICD-10-CM | POA: Diagnosis not present

## 2022-03-18 DIAGNOSIS — F4310 Post-traumatic stress disorder, unspecified: Secondary | ICD-10-CM | POA: Diagnosis not present

## 2022-03-18 DIAGNOSIS — F411 Generalized anxiety disorder: Secondary | ICD-10-CM | POA: Diagnosis not present

## 2022-04-14 ENCOUNTER — Encounter: Payer: Self-pay | Admitting: Emergency Medicine

## 2022-04-14 ENCOUNTER — Other Ambulatory Visit: Payer: Self-pay

## 2022-04-14 ENCOUNTER — Ambulatory Visit
Admission: EM | Admit: 2022-04-14 | Discharge: 2022-04-14 | Disposition: A | Discharge status: Home / Self Care | Payer: Medicaid Other

## 2022-04-14 DIAGNOSIS — L03213 Periorbital cellulitis: Secondary | ICD-10-CM

## 2022-04-14 DIAGNOSIS — H66001 Acute suppurative otitis media without spontaneous rupture of ear drum, right ear: Secondary | ICD-10-CM | POA: Diagnosis not present

## 2022-04-14 HISTORY — DX: Insomnia, unspecified: G47.00

## 2022-04-14 MED ORDER — AMOXICILLIN-POT CLAVULANATE 875-125 MG PO TABS: 1.0000 | ORAL_TABLET | Freq: Two times a day (BID) | ORAL | 0 refills | Status: AC

## 2022-04-14 MED ORDER — SULFAMETHOXAZOLE-TRIMETHOPRIM 800-160 MG PO TABS: 1.0000 | ORAL_TABLET | Freq: Two times a day (BID) | ORAL | 0 refills | Status: AC
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
## 2022-04-14 NOTE — Discharge Instructions (Signed)
There appears to be infection in the skin around your eyelid.  This is called preseptal cellulitis.  Please start on the Bactrim and Augmentin to treat this.  The Augmentin will also treat the ear infection.  You can use cool compresses to help with pain and swelling.  You can also take Tylenol or ibuprofen for the pain in your eyelid.  Follow-up with your primary care provider with no improvement within 48 hours.  If your symptoms worsen despite treatment, go to the emergency room.

## 2022-04-14 NOTE — ED Triage Notes (Signed)
Pt reports right eye swelling/pain/drainage sine last night. Pt reports "drainage from my eye got in my right ear and now its hurting."

## 2022-04-29 DIAGNOSIS — F411 Generalized anxiety disorder: Secondary | ICD-10-CM | POA: Diagnosis not present

## 2022-04-29 DIAGNOSIS — F4310 Post-traumatic stress disorder, unspecified: Secondary | ICD-10-CM | POA: Diagnosis not present

## 2022-04-29 DIAGNOSIS — G4730 Sleep apnea, unspecified: Secondary | ICD-10-CM | POA: Diagnosis not present

## 2022-04-29 DIAGNOSIS — F3132 Bipolar disorder, current episode depressed, moderate: Secondary | ICD-10-CM | POA: Diagnosis not present

## 2022-05-08 DIAGNOSIS — Z6841 Body Mass Index (BMI) 40.0 and over, adult: Secondary | ICD-10-CM | POA: Diagnosis not present

## 2022-05-08 DIAGNOSIS — H109 Unspecified conjunctivitis: Secondary | ICD-10-CM | POA: Diagnosis not present

## 2022-05-08 DIAGNOSIS — J069 Acute upper respiratory infection, unspecified: Secondary | ICD-10-CM | POA: Diagnosis not present

## 2022-05-13 DIAGNOSIS — R2689 Other abnormalities of gait and mobility: Secondary | ICD-10-CM | POA: Diagnosis not present

## 2022-05-22 ENCOUNTER — Other Ambulatory Visit: Payer: Self-pay | Admitting: Gastroenterology

## 2022-05-22 DIAGNOSIS — K219 Gastro-esophageal reflux disease without esophagitis: Secondary | ICD-10-CM

## 2022-05-25 ENCOUNTER — Encounter: Payer: Self-pay | Admitting: Family

## 2022-05-25 ENCOUNTER — Ambulatory Visit: Payer: Medicaid Other | Admitting: Family

## 2022-05-25 VITALS — BP 158/96 | HR 98 | Temp 97.3°F | Ht 64.0 in | Wt 274.6 lb

## 2022-05-25 DIAGNOSIS — Z0000 Encounter for general adult medical examination without abnormal findings: Secondary | ICD-10-CM | POA: Diagnosis not present

## 2022-05-25 DIAGNOSIS — Z981 Arthrodesis status: Secondary | ICD-10-CM | POA: Diagnosis not present

## 2022-05-25 DIAGNOSIS — F17200 Nicotine dependence, unspecified, uncomplicated: Secondary | ICD-10-CM

## 2022-05-25 DIAGNOSIS — F32A Depression, unspecified: Secondary | ICD-10-CM | POA: Diagnosis not present

## 2022-05-25 DIAGNOSIS — G8929 Other chronic pain: Secondary | ICD-10-CM

## 2022-05-25 DIAGNOSIS — R159 Full incontinence of feces: Secondary | ICD-10-CM

## 2022-05-25 DIAGNOSIS — F4310 Post-traumatic stress disorder, unspecified: Secondary | ICD-10-CM

## 2022-05-25 DIAGNOSIS — I10 Essential (primary) hypertension: Secondary | ICD-10-CM

## 2022-05-25 DIAGNOSIS — Z0001 Encounter for general adult medical examination with abnormal findings: Secondary | ICD-10-CM | POA: Diagnosis not present

## 2022-05-25 DIAGNOSIS — F411 Generalized anxiety disorder: Secondary | ICD-10-CM

## 2022-05-25 DIAGNOSIS — K219 Gastro-esophageal reflux disease without esophagitis: Secondary | ICD-10-CM | POA: Diagnosis not present

## 2022-05-25 DIAGNOSIS — R152 Fecal urgency: Secondary | ICD-10-CM

## 2022-05-25 DIAGNOSIS — F321 Major depressive disorder, single episode, moderate: Secondary | ICD-10-CM | POA: Diagnosis not present

## 2022-05-25 DIAGNOSIS — Z1159 Encounter for screening for other viral diseases: Secondary | ICD-10-CM

## 2022-05-25 DIAGNOSIS — M545 Low back pain, unspecified: Secondary | ICD-10-CM

## 2022-05-25 DIAGNOSIS — K52832 Lymphocytic colitis: Secondary | ICD-10-CM

## 2022-05-25 MED ORDER — LISINOPRIL 20 MG PO TABS: 20.0000 mg | ORAL_TABLET | Freq: Every day | ORAL | 3 refills | Status: DC

## 2022-05-25 NOTE — Progress Notes (Signed)
Subjective:    Patient ID: Taylor [name], female    DOB: 04-18-1991, 32 y.o.   MRN: [id number]  Chief Complaint  Patient presents with   Hypertension   PT presents to the office today for chronic follow up. She is followed by [facility] every 2 months for PTSD, GAD, Depression, and Bipolar.   She is followed by GI for diarrhea, fecal urgency, and lymphocytic colitis.   She has chronic back pain and chronic right ankle pain and was going to pain clinic. However, they closed.  Hypertension This is a chronic problem. The current episode started more than 1 year ago. The problem is unchanged. The problem is uncontrolled. Associated symptoms include anxiety, malaise/fatigue and peripheral edema. Pertinent negatives include no shortness of breath. Risk factors for coronary artery disease include obesity, sedentary lifestyle and smoking/tobacco exposure. The current treatment provides moderate improvement.  Gastroesophageal Reflux She complains of belching and heartburn. This is a chronic problem. The current episode started more than 1 year ago. The problem occurs occasionally. Associated symptoms include fatigue. Risk factors include obesity. She has tried a PPI for the symptoms. The treatment provided moderate relief.  Anxiety Presents for follow-up visit. Symptoms include depressed mood, excessive worry, insomnia, irritability, nervous/anxious behavior and restlessness. Patient reports no shortness of breath. Symptoms occur occasionally. The severity of symptoms is mild.    Depression        This is a chronic problem.  The current episode started more than 1 year ago.   The onset quality is gradual.   The problem occurs intermittently.  Associated symptoms include fatigue, helplessness, hopelessness, insomnia, irritable, restlessness, decreased interest and sad.  Past treatments include SNRIs - Serotonin and norepinephrine reuptake inhibitors.  Past medical history includes anxiety.    Nicotine Dependence Presents for follow-up visit. Symptoms include fatigue, insomnia and irritability. Her urge triggers include company of smokers. The symptoms have been stable. She smokes < 1/2 a pack of cigarettes per day.      Review of Systems  Constitutional:  Positive for fatigue, irritability and malaise/fatigue.  Respiratory:  Negative for shortness of breath.   Gastrointestinal:  Positive for heartburn.  Psychiatric/Behavioral:  Positive for depression. The patient is nervous/anxious and has insomnia.   All other systems reviewed and are negative.  Family History  Problem Relation Age of Onset   Cancer Mother        Mouth   Hypertension Mother    COPD Mother    Hypertension Father    Diabetes Maternal Grandmother    Diabetes Paternal Grandmother    Hypertension Maternal Aunt    Social History   Socioeconomic History   Marital status: Single    Spouse name: Not on file   Number of children: 2   Years of education: Not on file   Highest education level: Not on file  Occupational History   Occupation: umemployment  Tobacco Use   Smoking status: Former    Packs/day: 1.00    Years: 15.00    Total pack years: 15.00    Types: Cigarettes    Quit date: 07/2021    Years since quitting: 0.8   Smokeless tobacco: Never  Vaping Use   Vaping Use: Former  Substance and Sexual Activity   Alcohol use: Yes    Comment: occasional wine   Drug use: No   Sexual activity: Yes    Birth control/protection: I.U.D.  Other Topics Concern   Not on file  Social History Narrative  Not on file   Social Determinants of Health   Financial Resource Strain: Low Risk  (03/25/2019)   Overall Financial Resource Strain (CARDIA)    Difficulty of Paying Living Expenses: Not hard at all  Food Insecurity: No Food Insecurity (03/25/2019)   Hunger Vital Sign    Worried About Running Out of Food in the Last Year: Never true    Ran Out of Food in the Last Year: Never true   Transportation Needs: No Transportation Needs (03/25/2019)   PRAPARE - Hydrologist (Medical): No    Lack of Transportation (Non-Medical): No  Physical Activity: Not on file  Stress: Not on file  Social Connections: Not on file       Objective:   Physical Exam Vitals reviewed.  Constitutional:      General: She is irritable. She is not in acute distress.    Appearance: She is well-developed. She is obese.  HENT:     Head: Normocephalic and atraumatic.     Right Ear: Tympanic membrane normal.     Left Ear: Tympanic membrane normal.  Eyes:     Pupils: Pupils are equal, round, and reactive to light.  Neck:     Thyroid: No thyromegaly.  Cardiovascular:     Rate and Rhythm: Normal rate and regular rhythm.     Heart sounds: Normal heart sounds. No murmur heard. Pulmonary:     Effort: Pulmonary effort is normal. No respiratory distress.     Breath sounds: Normal breath sounds. No wheezing.  Abdominal:     General: Bowel sounds are normal. There is no distension.     Palpations: Abdomen is soft.     Tenderness: There is no abdominal tenderness.  Musculoskeletal:        General: Swelling (right ankle 2+) and tenderness present.     Cervical back: Normal range of motion and neck supple.  Skin:    General: Skin is warm and dry.  Neurological:     Mental Status: She is alert and oriented to person, place, and time.     Cranial Nerves: No cranial nerve deficit.     Deep Tendon Reflexes: Reflexes are normal and symmetric.  Psychiatric:        Behavior: Behavior normal.        Thought Content: Thought content normal.        Judgment: Judgment normal.       BP (!) 158/96   Pulse 98   Temp (!) 97.3 F (36.3 C) (Temporal)   Ht '5\' 4"'$  (1.626 m)   Wt 274 lb 9.6 oz (124.6 kg)   SpO2 94%   BMI 47.13 kg/m      Assessment & Plan:   Taylor [name] comes in today with chief complaint of Hypertension   Diagnosis and orders addressed:  1.  PTSD (post-traumatic stress disorder) - CMP14+EGFR - CBC with Differential/Platelet  2. Morbid obesity ([place]) - CMP14+EGFR - CBC with Differential/Platelet  3. Incontinence of feces with fecal urgency  - CMP14+EGFR - CBC with Differential/Platelet  4. Lymphocytic colitis - CMP14+EGFR - CBC with Differential/Platelet  5. Gastroesophageal reflux disease without esophagitis  - CMP14+EGFR - CBC with Differential/Platelet  6. GAD (generalized anxiety disorder)  - CMP14+EGFR - CBC with Differential/Platelet  7. Depression, major, single episode, moderate (HCC) - CMP14+EGFR - CBC with Differential/Platelet  8. Current smoker - CMP14+EGFR - CBC with Differential/Platelet  9. Chronic depression - CMP14+EGFR - CBC with Differential/Platelet  10. Annual  physical exam - CMP14+EGFR - CBC with Differential/Platelet - Lipid panel - TSH - Hepatitis C antibody  11. Primary hypertension Will start lisinopril 20 mg today -Dash diet information given -Exercise encouraged - Stress Management  -Continue current meds -RTO in 2 weeks  - lisinopril (ZESTRIL) 20 MG tablet; Take 1 tablet (20 mg total) by mouth daily.  Dispense: 90 tablet; Refill: 3 - CMP14+EGFR - CBC with Differential/Platelet  12. Need for hepatitis C screening test - CMP14+EGFR - CBC with Differential/Platelet - Hepatitis C antibody  13. H/O ankle fusion - Ambulatory referral to Pain Clinic  14. Chronic bilateral low back pain without sciatica  - Ambulatory referral to [facility] pending Health Maintenance reviewed Diet and exercise encouraged  Follow up plan: 2 weeks to recheck HTN   Evelina Dun, FNP

## 2022-05-25 NOTE — Patient Instructions (Signed)
Hypertension, Adult High blood pressure (hypertension) is when the force of blood pumping through the arteries is too strong. The arteries are the blood vessels that carry blood from the heart throughout the body. Hypertension forces the heart to work harder to pump blood and may cause arteries to become narrow or stiff. Untreated or uncontrolled hypertension can lead to a heart attack, heart failure, a stroke, kidney disease, and other problems. A blood pressure reading consists of a higher number over a lower number. Ideally, your blood pressure should be below 120/80. The first ("top") number is called the systolic pressure. It is a measure of the pressure in your arteries as your heart beats. The second ("bottom") number is called the diastolic pressure. It is a measure of the pressure in your arteries as the heart relaxes. What are the causes? The exact cause of this condition is not known. There are some conditions that result in high blood pressure. What increases the risk? Certain factors may make you more likely to develop high blood pressure. Some of these risk factors are under your control, including: Smoking. Not getting enough exercise or physical activity. Being overweight. Having too much fat, sugar, calories, or salt (sodium) in your diet. Drinking too much alcohol. Other risk factors include: Having a personal history of heart disease, diabetes, high cholesterol, or kidney disease. Stress. Having a family history of high blood pressure and high cholesterol. Having obstructive sleep apnea. Age. The risk increases with age. What are the signs or symptoms? High blood pressure may not cause symptoms. Very high blood pressure (hypertensive crisis) may cause: Headache. Fast or irregular heartbeats (palpitations). Shortness of breath. Nosebleed. Nausea and vomiting. Vision changes. Severe chest pain, dizziness, and seizures. How is this diagnosed? This condition is diagnosed by  measuring your blood pressure while you are seated, with your arm resting on a flat surface, your legs uncrossed, and your feet flat on the floor. The cuff of the blood pressure monitor will be placed directly against the skin of your upper arm at the level of your heart. Blood pressure should be measured at least twice using the same arm. Certain conditions can cause a difference in blood pressure between your right and left arms. If you have a high blood pressure reading during one visit or you have normal blood pressure with other risk factors, you may be asked to: Return on a different day to have your blood pressure checked again. Monitor your blood pressure at home for 1 week or longer. If you are diagnosed with hypertension, you may have other blood or imaging tests to help your health care provider understand your overall risk for other conditions. How is this treated? This condition is treated by making healthy lifestyle changes, such as eating healthy foods, exercising more, and reducing your alcohol intake. You may be referred for counseling on a healthy diet and physical activity. Your health care provider may prescribe medicine if lifestyle changes are not enough to get your blood pressure under control and if: Your systolic blood pressure is above 130. Your diastolic blood pressure is above 80. Your personal target blood pressure may vary depending on your medical conditions, your age, and other factors. Follow these instructions at home: Eating and drinking  Eat a diet that is high in fiber and potassium, and low in sodium, added sugar, and fat. An example of this eating plan is called the DASH diet. DASH stands for Dietary Approaches to Stop Hypertension. To eat this way: Eat   plenty of fresh fruits and vegetables. Try to fill one half of your plate at each meal with fruits and vegetables. Eat whole grains, such as whole-wheat pasta, brown rice, or whole-grain bread. Fill about one  fourth of your plate with whole grains. Eat or drink low-fat dairy products, such as skim milk or low-fat yogurt. Avoid fatty cuts of meat, processed or cured meats, and poultry with skin. Fill about one fourth of your plate with lean proteins, such as fish, chicken without skin, beans, eggs, or tofu. Avoid pre-made and processed foods. These tend to be higher in sodium, added sugar, and fat. Reduce your daily sodium intake. Many people with hypertension should eat less than 1,500 mg of sodium a day. Do not drink alcohol if: Your health care provider tells you not to drink. You are pregnant, may be pregnant, or are planning to become pregnant. If you drink alcohol: Limit how much you have to: 0-1 drink a day for women. 0-2 drinks a day for men. Know how much alcohol is in your drink. In the [place] one drink equals one 12 oz bottle of beer (355 mL), one 5 oz glass of wine (148 mL), or one 1 oz glass of hard liquor (44 mL). Lifestyle  Work with your health care provider to maintain a healthy body weight or to lose weight. Ask what an ideal weight is for you. Get at least 30 minutes of exercise that causes your heart to beat faster (aerobic exercise) most days of the week. Activities may include walking, swimming, or biking. Include exercise to strengthen your muscles (resistance exercise), such as Pilates or lifting weights, as part of your weekly exercise routine. Try to do these types of exercises for 30 minutes at least 3 days a week. Do not use any products that contain nicotine or tobacco. These products include cigarettes, chewing tobacco, and vaping devices, such as e-cigarettes. If you need help quitting, ask your health care provider. Monitor your blood pressure at home as told by your health care provider. Keep all follow-up visits. This is important. Medicines Take over-the-counter and prescription medicines only as told by your health care provider. Follow directions carefully. Blood  pressure medicines must be taken as prescribed. Do not skip doses of blood pressure medicine. Doing this puts you at risk for problems and can make the medicine less effective. Ask your health care provider about side effects or reactions to medicines that you should watch for. Contact a health care provider if you: Think you are having a reaction to a medicine you are taking. Have headaches that keep coming back (recurring). Feel dizzy. Have swelling in your ankles. Have trouble with your vision. Get help right away if you: Develop a severe headache or confusion. Have unusual weakness or numbness. Feel faint. Have severe pain in your chest or abdomen. Vomit repeatedly. Have trouble breathing. These symptoms may be an emergency. Get help right away. Call 911. Do not wait to see if the symptoms will go away. Do not drive yourself to the hospital. Summary Hypertension is when the force of blood pumping through your arteries is too strong. If this condition is not controlled, it may put you at risk for serious complications. Your personal target blood pressure may vary depending on your medical conditions, your age, and other factors. For most people, a normal blood pressure is less than 120/80. Hypertension is treated with lifestyle changes, medicines, or a combination of both. Lifestyle changes include losing weight, eating a healthy,   low-sodium diet, exercising more, and limiting alcohol. This information is not intended to replace advice given to you by your health care provider. Make sure you discuss any questions you have with your health care provider. Document Revised: 03/04/2021 Document Reviewed: 03/04/2021 Elsevier Patient Education  2023 [employer]  

## 2022-05-26 LAB — CMP14+EGFR
ALT: 30 IU/L (ref 0–32)
AST: 24 IU/L (ref 0–40)
Albumin/Globulin Ratio: 1.8 (ref 1.2–2.2)
Albumin: 4.5 g/dL (ref 3.9–4.9)
Alkaline Phosphatase: 124 IU/L — ABNORMAL HIGH (ref 44–121)
BUN/Creatinine Ratio: 12 (ref 9–23)
BUN: 8 mg/dL (ref 6–20)
Bilirubin Total: 0.3 mg/dL (ref 0.0–1.2)
CO2: 21 mmol/L (ref 20–29)
Calcium: 9.5 mg/dL (ref 8.7–10.2)
Chloride: 100 mmol/L (ref 96–106)
Creatinine, Ser: 0.67 mg/dL (ref 0.57–1.00)
Globulin, Total: 2.5 g/dL (ref 1.5–4.5)
Glucose: 108 mg/dL — ABNORMAL HIGH (ref 70–99)
Potassium: 4 mmol/L (ref 3.5–5.2)
Sodium: 138 mmol/L (ref 134–144)
Total Protein: 7 g/dL (ref 6.0–8.5)
eGFR: 118 mL/min/{1.73_m2} (ref >59)

## 2022-05-26 LAB — LIPID PANEL
Chol/HDL Ratio: 4.6 ratio — ABNORMAL HIGH (ref 0.0–4.4)
Cholesterol, Total: 259 mg/dL — ABNORMAL HIGH (ref 100–199)
HDL: 56 mg/dL (ref >39)
LDL Chol Calc (NIH): 157 mg/dL — ABNORMAL HIGH (ref 0–99)
Triglycerides: 250 mg/dL — ABNORMAL HIGH (ref 0–149)
VLDL Cholesterol Cal: 46 mg/dL — ABNORMAL HIGH (ref 5–40)

## 2022-05-26 LAB — CBC WITH DIFFERENTIAL/PLATELET
Basophils Absolute: 0.1 10*3/uL (ref 0.0–0.2)
Basos: 1 %
EOS (ABSOLUTE): 0.2 10*3/uL (ref 0.0–0.4)
Eos: 2 %
Hematocrit: 40.3 % (ref 34.0–46.6)
Hemoglobin: 13 g/dL (ref 11.1–15.9)
Immature Grans (Abs): 0 10*3/uL (ref 0.0–0.1)
Immature Granulocytes: 0 %
Lymphocytes Absolute: 1.8 10*3/uL (ref 0.7–3.1)
Lymphs: 16 %
MCH: 30.2 pg (ref 26.6–33.0)
MCHC: 32.3 g/dL (ref 31.5–35.7)
MCV: 94 fL (ref 79–97)
Monocytes Absolute: 0.5 10*3/uL (ref 0.1–0.9)
Monocytes: 4 %
Neutrophils Absolute: 9 10*3/uL — ABNORMAL HIGH (ref 1.4–7.0)
Neutrophils: 77 %
Platelets: 262 10*3/uL (ref 150–450)
RBC: 4.3 x10E6/uL (ref 3.77–5.28)
RDW: 13.8 % (ref 11.7–15.4)
WBC: 11.6 10*3/uL — ABNORMAL HIGH (ref 3.4–10.8)

## 2022-05-26 LAB — TSH: TSH: 0.765 u[IU]/mL (ref 0.450–4.500)

## 2022-05-26 LAB — HEPATITIS C ANTIBODY: Hep C Virus Ab: REACTIVE — AB

## 2022-06-08 ENCOUNTER — Encounter: Payer: Self-pay | Admitting: Family

## 2022-06-08 ENCOUNTER — Telehealth (INDEPENDENT_AMBULATORY_CARE_PROVIDER_SITE_OTHER): Payer: Medicaid Other | Admitting: Family

## 2022-06-08 VITALS — BP 158/110

## 2022-06-08 DIAGNOSIS — I10 Essential (primary) hypertension: Secondary | ICD-10-CM

## 2022-06-08 DIAGNOSIS — Z716 Tobacco abuse counseling: Secondary | ICD-10-CM

## 2022-06-08 DIAGNOSIS — F17200 Nicotine dependence, unspecified, uncomplicated: Secondary | ICD-10-CM

## 2022-06-08 DIAGNOSIS — F411 Generalized anxiety disorder: Secondary | ICD-10-CM | POA: Diagnosis not present

## 2022-06-08 MED ORDER — LISINOPRIL 40 MG PO TABS: 40.0000 mg | ORAL_TABLET | Freq: Every day | ORAL | 1 refills | Status: DC

## 2022-06-08 MED ORDER — VARENICLINE TARTRATE (STARTER) 0.5 MG X 11 & 1 MG X 42 PO TBPK: ORAL_TABLET | ORAL | 0 refills | Status: DC

## 2022-06-08 MED ORDER — VARENICLINE TARTRATE 1 MG PO TABS: 1.0000 mg | ORAL_TABLET | Freq: Two times a day (BID) | ORAL | 0 refills | Status: DC

## 2022-06-08 NOTE — Progress Notes (Signed)
Virtual Visit Consent   Taylor [name], you are scheduled for a virtual visit with a [facility] provider today. Just as with appointments in the office, your consent must be obtained to participate. Your consent will be active for this visit and any virtual visit you may have with one of our providers in the next 365 days. If you have a MyChart account, a copy of this consent can be sent to you electronically.  As this is a virtual visit, video technology does not allow for your provider to perform a traditional examination. This may limit your provider's ability to fully assess your condition. If your provider identifies any concerns that need to be evaluated in person or the need to arrange testing (such as labs, EKG, etc.), we will make arrangements to do so. Although advances in technology are sophisticated, we cannot ensure that it will always work on either your end or our end. If the connection with a video visit is poor, the visit may have to be switched to a telephone visit. With either a video or telephone visit, we are not always able to ensure that we have a secure connection.  By engaging in this virtual visit, you consent to the provision of healthcare and authorize for your insurance to be billed (if applicable) for the services provided during this visit. Depending on your insurance coverage, you may receive a charge related to this service.  I need to obtain your verbal consent now. Are you willing to proceed with your visit today? Taylor [name] has provided verbal consent on 06/08/2022 for a virtual visit (video or telephone). Evelina Dun, FNP  Date: 06/08/2022 1:54 PM  Virtual Visit via Video Note   I, Evelina Dun, connected with  [name]  ([id number], [birth year]) on 06/08/22 at  1:55 PM EST by a video-enabled telemedicine application and verified that I am speaking with the correct person using two identifiers.  Location: Patient: Virtual Visit Location  Patient: Home Provider: Virtual Visit Location Provider: Home Office   I discussed the limitations of evaluation and management by telemedicine and the availability of in person appointments. The patient expressed understanding and agreed to proceed.    History of Present Illness: Taylor [name] is a 32 y.o. who identifies as a female who was assigned female at birth, and is being seen today for rechecking HTN. She was started on Lisinopril 20 mg on 05/25/22. She reports she has had increased anxiety.   States she is smoking less than 1/2 pack a day because of money. Requesting medications.   HPI: Hypertension This is a new problem. The current episode started more than 1 month ago. The problem has been waxing and waning since onset. The problem is uncontrolled. Associated symptoms include anxiety and headaches. Pertinent negatives include no malaise/fatigue, peripheral edema or shortness of breath. Risk factors for coronary artery disease include dyslipidemia, obesity and sedentary lifestyle. Past treatments include ACE inhibitors. The current treatment provides mild improvement.  Nicotine Dependence Presents for follow-up visit. Symptoms include irritability. Her urge triggers include company of smokers. The symptoms have been stable. She smokes < 1/2 a pack of cigarettes per day.  Anxiety Presents for follow-up visit. Symptoms include depressed mood, excessive worry, irritability, nervous/anxious behavior and restlessness. Patient reports no shortness of breath.      Problems:  Patient Active Problem List   Diagnosis Date Noted   H/O ankle fusion 03/11/2022   Prolapsed internal hemorrhoids, grade 2 09/16/2021   Displaced  pilon fracture of right tibia, initial encounter for closed fracture    Lymphocytic colitis 05/16/2021   Bloating 11/28/2020   Incontinence of feces with fecal urgency 11/15/2020   Sprain of anterior talofibular ligament of right ankle    Impingement of right  ankle joint    PTSD (post-traumatic stress disorder) 10/04/2020   Migraine without aura and without status migrainosus, not intractable 10/04/2020   Hemorrhoids 01/30/2020   Leukocytosis 09/20/2019   Pain in left foot 09/14/2019   Chronic depression 08/04/2019   Chronic back pain 08/04/2019   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[place]) 08/04/2019   Lumbar radiculopathy 08/04/2019   IUD (intrauterine device) in place 04/27/2019   Morbid obesity ([place]) 01/23/2019   H/O Abnormal LFTs 10/17/2018   Bicuspid aortic valve    GAD (generalized anxiety disorder) 06/30/2018   GERD (gastroesophageal reflux disease) 06/06/2018   Current smoker 06/06/2018   Depression, major, single episode, moderate ([name]) 06/06/2018    Allergies:  Allergies  Allergen Reactions   Other Itching    Animal Dander   Medications:  Current Outpatient Medications:    lisinopril (ZESTRIL) 40 MG tablet, Take 1 tablet (40 mg total) by mouth daily., Disp: 90 tablet, Rfl: 1   [START ON 06/30/2022] varenicline (CHANTIX) 1 MG tablet, Take 1 tablet (1 mg total) by mouth 2 (two) times daily., Disp: 180 tablet, Rfl: 0   Varenicline Tartrate, Starter, (CHANTIX STARTING MONTH PAK) 0.5 MG X 11 & 1 MG X 42 TBPK, 0.5 mg PO qd X3 days, then 0.5 mg PO BID for 4. Then 1 mg BID., Disp: 53 each, Rfl: 0   B Complex-C (SUPER B COMPLEX PO), Take 1 tablet by mouth in the morning., Disp: , Rfl:    buPROPion (WELLBUTRIN XL) 300 MG 24 hr tablet, Take 300 mg by mouth every morning., Disp: , Rfl:    cloNIDine (CATAPRES) 0.2 MG tablet, Take 0.2 mg by mouth daily. At night, Disp: , Rfl:    DULoxetine (CYMBALTA) 30 MG capsule, Take 90 mg by mouth in the morning. 30 mg + 60 mg=90 mg daily, Disp: , Rfl:    omeprazole (PRILOSEC) 40 MG capsule, Take one capsule once to twice daily before meal for reflux., Disp: 60 capsule, Rfl: 5   VRAYLAR 3 MG capsule, Take 3 mg by mouth at bedtime., Disp: , Rfl:   Observations/Objective: Patient is well-developed,  well-nourished in no acute distress.  Resting comfortably  at home.  Head is normocephalic, atraumatic.  No labored breathing.  Speech is clear and coherent with logical content.  Patient is alert and oriented at baseline.    Assessment and Plan: 1. Primary hypertension - lisinopril (ZESTRIL) 40 MG tablet; Take 1 tablet (40 mg total) by mouth daily.  Dispense: 90 tablet; Refill: 1 - BMP8+EGFR; Future  2. Current smoker - BMP8+EGFR; Future  3. Encounter for smoking cessation counseling - BMP8+EGFR; Future - Varenicline Tartrate, Starter, (CHANTIX STARTING MONTH PAK) 0.5 MG X 11 & 1 MG X 42 TBPK; 0.5 mg PO qd X3 days, then 0.5 mg PO BID for 4. Then 1 mg BID.  Dispense: 53 each; Refill: 0 - varenicline (CHANTIX) 1 MG tablet; Take 1 tablet (1 mg total) by mouth 2 (two) times daily.  Dispense: 180 tablet; Refill: 0  4. GAD (generalized anxiety disorder) - BMP8+EGFR; Future  Will increase Lisinopril to 40 mg from 20 mg  She will have BMP repeated this week Start chantix, discussed side effects. Stop if having any SI Follow up in  2-4 weeks   Follow Up Instructions: I discussed the assessment and treatment plan with the patient. The patient was provided an opportunity to ask questions and all were answered. The patient agreed with the plan and demonstrated an understanding of the instructions.  A copy of instructions were sent to the patient via MyChart unless otherwise noted below.    The patient was advised to call back or seek an in-person evaluation if the symptoms worsen or if the condition fails to improve as anticipated.  Time:  I spent 16 minutes with the patient via telehealth technology discussing the above problems/concerns.    Evelina Dun, FNP

## 2022-06-12 ENCOUNTER — Other Ambulatory Visit: Payer: Medicaid Other

## 2022-06-12 ENCOUNTER — Telehealth: Payer: Self-pay | Admitting: Family

## 2022-06-12 DIAGNOSIS — F411 Generalized anxiety disorder: Secondary | ICD-10-CM | POA: Diagnosis not present

## 2022-06-12 DIAGNOSIS — I10 Essential (primary) hypertension: Secondary | ICD-10-CM

## 2022-06-12 DIAGNOSIS — F17200 Nicotine dependence, unspecified, uncomplicated: Secondary | ICD-10-CM

## 2022-06-12 DIAGNOSIS — Z716 Tobacco abuse counseling: Secondary | ICD-10-CM | POA: Diagnosis not present

## 2022-06-12 LAB — BMP8+EGFR
BUN/Creatinine Ratio: 13 (ref 9–23)
BUN: 15 mg/dL (ref 6–20)
CO2: 22 mmol/L (ref 20–29)
Calcium: 10.6 mg/dL — ABNORMAL HIGH (ref 8.7–10.2)
Chloride: 94 mmol/L — ABNORMAL LOW (ref 96–106)
Creatinine, Ser: 1.14 mg/dL — ABNORMAL HIGH (ref 0.57–1.00)
Glucose: 100 mg/dL — ABNORMAL HIGH (ref 70–99)
Potassium: 4.3 mmol/L (ref 3.5–5.2)
Sodium: 135 mmol/L (ref 134–144)
eGFR: 65 mL/min/{1.73_m2} (ref >59)

## 2022-06-15 ENCOUNTER — Encounter: Payer: Self-pay | Admitting: Physical Medicine & Rehabilitation

## 2022-06-15 ENCOUNTER — Other Ambulatory Visit: Payer: Self-pay | Admitting: Family Medicine

## 2022-06-15 MED ORDER — AMLODIPINE BESYLATE 5 MG PO TABS: 5.0000 mg | ORAL_TABLET | Freq: Every day | ORAL | 1 refills | Status: DC

## 2022-06-15 NOTE — Telephone Encounter (Signed)
Continue Lisinopril 40 mg and will add Norvasc 5 mg. Keep follow up with me.

## 2022-06-29 ENCOUNTER — Encounter: Payer: Medicaid Other | Admitting: Physical Medicine & Rehabilitation

## 2022-07-03 ENCOUNTER — Ambulatory Visit: Payer: Medicaid Other | Admitting: Family

## 2022-07-03 ENCOUNTER — Telehealth: Payer: Self-pay | Admitting: Family Medicine

## 2022-07-09 ENCOUNTER — Encounter: Payer: Self-pay | Admitting: Radiology

## 2022-07-10 ENCOUNTER — Telehealth: Payer: Medicaid Other | Admitting: Family

## 2022-07-10 ENCOUNTER — Telehealth: Payer: Self-pay | Admitting: Family

## 2022-07-10 NOTE — Telephone Encounter (Signed)
Video visit appointment made.   [place] Dun, FNP

## 2022-07-14 DIAGNOSIS — F4310 Post-traumatic stress disorder, unspecified: Secondary | ICD-10-CM | POA: Diagnosis not present

## 2022-07-14 DIAGNOSIS — G4730 Sleep apnea, unspecified: Secondary | ICD-10-CM | POA: Diagnosis not present

## 2022-07-14 DIAGNOSIS — F3132 Bipolar disorder, current episode depressed, moderate: Secondary | ICD-10-CM | POA: Diagnosis not present

## 2022-07-14 DIAGNOSIS — F411 Generalized anxiety disorder: Secondary | ICD-10-CM | POA: Diagnosis not present

## 2022-07-20 ENCOUNTER — Encounter: Payer: Self-pay | Admitting: Family

## 2022-07-20 ENCOUNTER — Telehealth: Payer: Medicaid Other | Admitting: Family

## 2022-07-20 DIAGNOSIS — B1920 Unspecified viral hepatitis C without hepatic coma: Secondary | ICD-10-CM

## 2022-07-20 DIAGNOSIS — J3489 Other specified disorders of nose and nasal sinuses: Secondary | ICD-10-CM | POA: Diagnosis not present

## 2022-07-20 DIAGNOSIS — G8929 Other chronic pain: Secondary | ICD-10-CM

## 2022-07-20 DIAGNOSIS — F321 Major depressive disorder, single episode, moderate: Secondary | ICD-10-CM | POA: Diagnosis not present

## 2022-07-20 DIAGNOSIS — M545 Low back pain, unspecified: Secondary | ICD-10-CM | POA: Diagnosis not present

## 2022-07-20 DIAGNOSIS — F32A Depression, unspecified: Secondary | ICD-10-CM

## 2022-07-20 DIAGNOSIS — F4310 Post-traumatic stress disorder, unspecified: Secondary | ICD-10-CM | POA: Diagnosis not present

## 2022-07-20 DIAGNOSIS — K219 Gastro-esophageal reflux disease without esophagitis: Secondary | ICD-10-CM | POA: Diagnosis not present

## 2022-07-20 DIAGNOSIS — F411 Generalized anxiety disorder: Secondary | ICD-10-CM

## 2022-07-20 DIAGNOSIS — G43009 Migraine without aura, not intractable, without status migrainosus: Secondary | ICD-10-CM

## 2022-07-20 DIAGNOSIS — F17200 Nicotine dependence, unspecified, uncomplicated: Secondary | ICD-10-CM | POA: Diagnosis not present

## 2022-07-20 MED ORDER — PANTOPRAZOLE SODIUM 40 MG PO TBEC
40.0000 mg | DELAYED_RELEASE_TABLET | Freq: Two times a day (BID) | ORAL | 3 refills | Status: DC
Start: 2022-07-20 — End: 2023-03-03

## 2022-07-20 MED ORDER — MUPIROCIN 2 % EX OINT: 1.0000 | TOPICAL_OINTMENT | Freq: Two times a day (BID) | CUTANEOUS | 1 refills | Status: DC

## 2022-07-20 MED ORDER — ONDANSETRON HCL 4 MG PO TABS: 4.0000 mg | ORAL_TABLET | Freq: Three times a day (TID) | ORAL | 0 refills | Status: DC | PRN

## 2022-07-20 NOTE — Progress Notes (Signed)
Virtual Visit Consent   Taylor [name], you are scheduled for a virtual visit with a [place] provider today. Just as with appointments in the office, your consent must be obtained to participate. Your consent will be active for this visit and any virtual visit you may have with one of our providers in the next 365 days. If you have a MyChart account, a copy of this consent can be sent to you electronically.  As this is a virtual visit, video technology does not allow for your provider to perform a traditional examination. This may limit your provider's ability to fully assess your condition. If your provider identifies any concerns that need to be evaluated in person or the need to arrange testing (such as labs, EKG, etc.), we will make arrangements to do so. Although advances in technology are sophisticated, we cannot ensure that it will always work on either your end or our end. If the connection with a video visit is poor, the visit may have to be switched to a telephone visit. With either a video or telephone visit, we are not always able to ensure that we have a secure connection.  By engaging in this virtual visit, you consent to the provision of healthcare and authorize for your insurance to be billed (if applicable) for the services provided during this visit. Depending on your insurance coverage, you may receive a charge related to this service.  I need to obtain your verbal consent now. Are you willing to proceed with your visit today? Taylor [name] has provided verbal consent on 07/20/2022 for a virtual visit (video or telephone). Evelina Dun, FNP  Date: 07/20/2022 2:56 PM  Virtual Visit via Video Note   I, Evelina Dun, connected with  Taylor [name]  ([id number], Feb 01, 1991) on 07/20/22 at  1:55 PM EDT by a video-enabled telemedicine application and verified that I am speaking with the correct person using two identifiers.  Location: Patient: Virtual Visit Location  Patient: Home Provider: Virtual Visit Location Provider: Office/Clinic   I discussed the limitations of evaluation and management by telemedicine and the availability of in person appointments. The patient expressed understanding and agreed to proceed.    History of Present Illness: Taylor [name] is a 32 y.o. who identifies as a female who was assigned female at birth, and is being seen today for chronic follow up. She is followed by [facility] every 2 months for PTSD, GAD, Depression, and Bipolar.    She is followed by GI for diarrhea, fecal urgency, and lymphocytic colitis.    She has chronic back pain and chronic right ankle pain and was going to pain clinic. However, they closed. Has an appointment to establish care on April 1.  She had a positive Hep C. Needs to have HCV RNA test.   She reports bilateral sores in nostrils. States she will have a large scab and remove it and it will bleed.  HPI: Migraine  This is a chronic problem. The current episode started more than 1 year ago. The problem occurs intermittently (couple times a month). The pain is located in the Left unilateral region. The pain is mild. Associated symptoms include back pain, ear pain, nausea, phonophobia and photophobia. Pertinent negatives include no coughing, hearing loss, neck pain, rhinorrhea or sore throat.  Gastroesophageal Reflux She complains of belching, heartburn and nausea. She reports no coughing or no sore throat. This is a chronic problem. The current episode started more than 1 year ago. The  problem occurs occasionally. Risk factors include obesity. She has tried a PPI for the symptoms. The treatment provided mild relief.  Anxiety Presents for follow-up visit. Symptoms include depressed mood, excessive worry, irritability, nausea, palpitations and restlessness. Symptoms occur occasionally. The severity of symptoms is mild.    Depression        This is a chronic problem.  The current episode  started more than 1 year ago.   Associated symptoms include restlessness and sad.  Associated symptoms include no helplessness and no hopelessness.  Past treatments include SNRIs - Serotonin and norepinephrine reuptake inhibitors.  Past medical history includes anxiety.   Back Pain This is a chronic problem. The current episode started more than 1 year ago. The problem occurs intermittently. The problem has been waxing and waning since onset. The pain is present in the lumbar spine. The quality of the pain is described as aching. The pain is at a severity of 6/10. The pain is moderate. Risk factors include obesity. She has tried analgesics for the symptoms.  Otalgia  There is pain in both ears. This is a new problem. The current episode started in the past 7 days. The problem occurs constantly. The problem has been gradually worsening. There has been no fever. The pain is at a severity of 3/10. The pain is mild. Associated symptoms include ear discharge. Pertinent negatives include no coughing, hearing loss, neck pain, rhinorrhea or sore throat. She has tried NSAIDs for the symptoms. The treatment provided mild relief.    Problems:  Patient Active Problem List   Diagnosis Date Noted   H/O ankle fusion 03/11/2022   Prolapsed internal hemorrhoids, grade 2 09/16/2021   Displaced pilon fracture of right tibia, initial encounter for closed fracture    Lymphocytic colitis 05/16/2021   Bloating 11/28/2020   Incontinence of feces with fecal urgency 11/15/2020   Sprain of anterior talofibular ligament of right ankle    Impingement of right ankle joint    PTSD (post-traumatic stress disorder) 10/04/2020   Migraine without aura and without status migrainosus, not intractable 10/04/2020   Hemorrhoids 01/30/2020   Leukocytosis 09/20/2019   Pain in left foot 09/14/2019   Chronic depression 08/04/2019   Chronic back pain 08/04/2019   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[place]) 08/04/2019   Lumbar radiculopathy  08/04/2019   IUD (intrauterine device) in place 04/27/2019   Morbid obesity ([place]) 01/23/2019   H/O Abnormal LFTs 10/17/2018   Bicuspid aortic valve    GAD (generalized anxiety disorder) 06/30/2018   GERD (gastroesophageal reflux disease) 06/06/2018   Current smoker 06/06/2018   Depression, major, single episode, moderate ([place]) 06/06/2018    Allergies:  Allergies  Allergen Reactions   Other Itching    Animal Dander   Medications:  Current Outpatient Medications:    mupirocin ointment (BACTROBAN) 2 %, Apply 1 Application topically 2 (two) times daily., Disp: 60 g, Rfl: 1   ondansetron (ZOFRAN) 4 MG tablet, Take 1 tablet (4 mg total) by mouth every 8 (eight) hours as needed for nausea or vomiting., Disp: 20 tablet, Rfl: 0   pantoprazole (PROTONIX) 40 MG tablet, Take 1 tablet (40 mg total) by mouth 2 (two) times daily., Disp: 60 tablet, Rfl: 3   B Complex-C (SUPER B COMPLEX PO), Take 1 tablet by mouth in the morning., Disp: , Rfl:    buPROPion (WELLBUTRIN XL) 300 MG 24 hr tablet, Take 300 mg by mouth every morning., Disp: , Rfl:    DULoxetine (CYMBALTA) 30 MG capsule, Take 90  mg by mouth in the morning. 30 mg + 60 mg=90 mg daily, Disp: , Rfl:    lisinopril (ZESTRIL) 40 MG tablet, Take 1 tablet (40 mg total) by mouth daily., Disp: 90 tablet, Rfl: 1   VRAYLAR 3 MG capsule, Take 3 mg by mouth at bedtime., Disp: , Rfl:   Observations/Objective: Patient is well-developed, well-nourished in no acute distress.  Resting comfortably  at home.  Head is normocephalic, atraumatic.  No labored breathing.  Speech is clear and coherent with logical content.  Patient is alert and oriented at baseline.  Scab in bilateral nostrils   Assessment and Plan: 1. Gastroesophageal reflux disease without esophagitis - ondansetron (ZOFRAN) 4 MG tablet; Take 1 tablet (4 mg total) by mouth every 8 (eight) hours as needed for nausea or vomiting.  Dispense: 20 tablet; Refill: 0 - pantoprazole (PROTONIX) 40 MG  tablet; Take 1 tablet (40 mg total) by mouth 2 (two) times daily.  Dispense: 60 tablet; Refill: 3 - CMP14+EGFR; Future  2. Current smoker - CMP14+EGFR; Future  3. Depression, major, single episode, moderate ([facility]) - CMP14+EGFR; Future  4. GAD (generalized anxiety disorder) - CMP14+EGFR; Future  5. Morbid obesity ([place]) - CMP14+EGFR; Future  6. Chronic depression - CMP14+EGFR; Future  7. Chronic bilateral low back pain without sciatica - CMP14+EGFR; Future  8. PTSD (post-traumatic stress disorder) - CMP14+EGFR; Future  9. Migraine without aura and without status migrainosus, not intractable - CMP14+EGFR; Future  10. Hepatitis C test positive - HCV RNA quant; Future - CMP14+EGFR; Future  11. Sore in nose - mupirocin ointment (BACTROBAN) 2 %; Apply 1 Application topically 2 (two) times daily.  Dispense: 60 g; Refill: 1 - CMP14+EGFR; Future  Avoid picking at nose Stop Prilosec and start Protonix 40 mg  -Diet discussed- Avoid fried, spicy, citrus foods, caffeine and alcohol -Do not eat 2-3 hours before bedtime -Encouraged small frequent meals -Avoid NSAID's -Zofran as needed for nausea -Labs pending, pt will come in the next few weeks to get drawn.  Follow up if symptoms worsen or do not improve   Follow Up Instructions: I discussed the assessment and treatment plan with the patient. The patient was provided an opportunity to ask questions and all were answered. The patient agreed with the plan and demonstrated an understanding of the instructions.  A copy of instructions were sent to the patient via MyChart unless otherwise noted below.    The patient was advised to call back or seek an in-person evaluation if the symptoms worsen or if the condition fails to improve as anticipated.  Time:  I spent 18 minutes with the patient via telehealth technology discussing the above problems/concerns.    Evelina Dun, FNP

## 2022-08-10 ENCOUNTER — Encounter: Payer: Medicaid Other | Admitting: Physical Medicine & Rehabilitation

## 2022-09-04 ENCOUNTER — Encounter: Payer: Medicare Other | Admitting: Physical Medicine & Rehabilitation

## 2022-09-09 ENCOUNTER — Ambulatory Visit
Admission: EM | Admit: 2022-09-09 | Discharge: 2022-09-09 | Disposition: A | Discharge status: Home / Self Care | Payer: Medicare Other

## 2022-09-09 ENCOUNTER — Emergency Department (HOSPITAL_COMMUNITY): Payer: Medicare Other

## 2022-09-09 ENCOUNTER — Telehealth: Payer: Self-pay | Admitting: Orthopedic Surgery

## 2022-09-09 ENCOUNTER — Inpatient Hospital Stay (HOSPITAL_COMMUNITY)
Admission: EM | Admit: 2022-09-09 | Discharge: 2022-09-12 | DRG: 560 | Disposition: A | Discharge status: Home / Self Care | Payer: Medicare Other | Attending: Internal Medicine | Admitting: Internal Medicine

## 2022-09-09 DIAGNOSIS — F319 Bipolar disorder, unspecified: Secondary | ICD-10-CM | POA: Diagnosis present

## 2022-09-09 DIAGNOSIS — Z6841 Body Mass Index (BMI) 40.0 and over, adult: Secondary | ICD-10-CM | POA: Diagnosis not present

## 2022-09-09 DIAGNOSIS — T847XXA Infection and inflammatory reaction due to other internal orthopedic prosthetic devices, implants and grafts, initial encounter: Principal | ICD-10-CM | POA: Diagnosis present

## 2022-09-09 DIAGNOSIS — Z8249 Family history of ischemic heart disease and other diseases of the circulatory system: Secondary | ICD-10-CM

## 2022-09-09 DIAGNOSIS — Z79899 Other long term (current) drug therapy: Secondary | ICD-10-CM | POA: Diagnosis not present

## 2022-09-09 DIAGNOSIS — Y9344 Activity, trampolining: Secondary | ICD-10-CM | POA: Diagnosis not present

## 2022-09-09 DIAGNOSIS — Y831 Surgical operation with implant of artificial internal device as the cause of abnormal reaction of the patient, or of later complication, without mention of misadventure at the time of the procedure: Secondary | ICD-10-CM | POA: Diagnosis present

## 2022-09-09 DIAGNOSIS — F4310 Post-traumatic stress disorder, unspecified: Secondary | ICD-10-CM | POA: Diagnosis present

## 2022-09-09 DIAGNOSIS — M797 Fibromyalgia: Secondary | ICD-10-CM | POA: Diagnosis present

## 2022-09-09 DIAGNOSIS — M86171 Other acute osteomyelitis, right ankle and foot: Secondary | ICD-10-CM | POA: Diagnosis present

## 2022-09-09 DIAGNOSIS — F411 Generalized anxiety disorder: Secondary | ICD-10-CM | POA: Diagnosis present

## 2022-09-09 DIAGNOSIS — Z87891 Personal history of nicotine dependence: Secondary | ICD-10-CM | POA: Diagnosis not present

## 2022-09-09 DIAGNOSIS — M86271 Subacute osteomyelitis, right ankle and foot: Secondary | ICD-10-CM

## 2022-09-09 DIAGNOSIS — I10 Essential (primary) hypertension: Secondary | ICD-10-CM | POA: Insufficient documentation

## 2022-09-09 DIAGNOSIS — F410 Panic disorder [episodic paroxysmal anxiety] without agoraphobia: Secondary | ICD-10-CM | POA: Diagnosis present

## 2022-09-09 DIAGNOSIS — R600 Localized edema: Secondary | ICD-10-CM | POA: Diagnosis not present

## 2022-09-09 DIAGNOSIS — G8929 Other chronic pain: Secondary | ICD-10-CM | POA: Diagnosis present

## 2022-09-09 DIAGNOSIS — S99911S Unspecified injury of right ankle, sequela: Secondary | ICD-10-CM

## 2022-09-09 DIAGNOSIS — M869 Osteomyelitis, unspecified: Principal | ICD-10-CM

## 2022-09-09 DIAGNOSIS — Z833 Family history of diabetes mellitus: Secondary | ICD-10-CM | POA: Diagnosis not present

## 2022-09-09 DIAGNOSIS — M14671 Charcot's joint, right ankle and foot: Secondary | ICD-10-CM

## 2022-09-09 DIAGNOSIS — Z825 Family history of asthma and other chronic lower respiratory diseases: Secondary | ICD-10-CM

## 2022-09-09 DIAGNOSIS — Z981 Arthrodesis status: Secondary | ICD-10-CM

## 2022-09-09 DIAGNOSIS — F32A Depression, unspecified: Secondary | ICD-10-CM | POA: Diagnosis present

## 2022-09-09 DIAGNOSIS — Z87442 Personal history of urinary calculi: Secondary | ICD-10-CM

## 2022-09-09 DIAGNOSIS — K219 Gastro-esophageal reflux disease without esophagitis: Secondary | ICD-10-CM | POA: Diagnosis present

## 2022-09-09 LAB — CBC WITH DIFFERENTIAL/PLATELET
Abs Immature Granulocytes: 0.03 10*3/uL (ref 0.00–0.07)
Basophils Absolute: 0 10*3/uL (ref 0.0–0.1)
Basophils Relative: 1 %
Eosinophils Absolute: 0.3 10*3/uL (ref 0.0–0.5)
Eosinophils Relative: 4 %
HCT: 36.3 % (ref 36.0–46.0)
Hemoglobin: 11.7 g/dL — ABNORMAL LOW (ref 12.0–15.0)
Immature Granulocytes: 0 %
Lymphocytes Relative: 22 %
Lymphs Abs: 1.8 10*3/uL (ref 0.7–4.0)
MCH: 31.9 pg (ref 26.0–34.0)
MCHC: 32.2 g/dL (ref 30.0–36.0)
MCV: 98.9 fL (ref 80.0–100.0)
Monocytes Absolute: 0.5 10*3/uL (ref 0.1–1.0)
Monocytes Relative: 7 %
Neutro Abs: 5.4 10*3/uL (ref 1.7–7.7)
Neutrophils Relative %: 66 %
Platelets: 262 10*3/uL (ref 150–400)
RBC: 3.67 MIL/uL — ABNORMAL LOW (ref 3.87–5.11)
RDW: 14.3 % (ref 11.5–15.5)
WBC: 8.1 10*3/uL (ref 4.0–10.5)
nRBC: 0 % (ref 0.0–0.2)

## 2022-09-09 LAB — COMPREHENSIVE METABOLIC PANEL
ALT: 32 U/L (ref 0–44)
AST: 26 U/L (ref 15–41)
Albumin: 3.9 g/dL (ref 3.5–5.0)
Alkaline Phosphatase: 91 U/L (ref 38–126)
Anion gap: 9 (ref 5–15)
BUN: 9 mg/dL (ref 6–20)
CO2: 26 mmol/L (ref 22–32)
Calcium: 9 mg/dL (ref 8.9–10.3)
Chloride: 98 mmol/L (ref 98–111)
Creatinine, Ser: 0.71 mg/dL (ref 0.44–1.00)
GFR, Estimated: >60 mL/min (ref >60)
Glucose, Bld: 124 mg/dL — ABNORMAL HIGH (ref 70–99)
Potassium: 3.8 mmol/L (ref 3.5–5.1)
Sodium: 133 mmol/L — ABNORMAL LOW (ref 135–145)
Total Bilirubin: 0.4 mg/dL (ref 0.3–1.2)
Total Protein: 7.4 g/dL (ref 6.5–8.1)

## 2022-09-09 LAB — SEDIMENTATION RATE: Sed Rate: 19 mm/hr (ref 0–22)

## 2022-09-09 LAB — CULTURE, BLOOD (ROUTINE X 2)

## 2022-09-09 LAB — LACTIC ACID, PLASMA: Lactic Acid, Venous: 1.1 mmol/L (ref 0.5–1.9)

## 2022-09-09 LAB — C-REACTIVE PROTEIN: CRP: 1 mg/dL — ABNORMAL HIGH (ref <1.0)

## 2022-09-09 MED ORDER — VANCOMYCIN HCL 2000 MG/400ML IV SOLN
2000.0000 mg | Freq: Once | INTRAVENOUS | Status: AC
  Administered 2022-09-09: 2000 mg via INTRAVENOUS
  Filled 2022-09-09: qty 400

## 2022-09-09 MED ORDER — IBUPROFEN 600 MG PO TABS
600.0000 mg | ORAL_TABLET | Freq: Three times a day (TID) | ORAL | Status: DC | PRN
  Filled 2022-09-09: qty 1

## 2022-09-09 MED ORDER — ONDANSETRON HCL 4 MG/2ML IJ SOLN: 4.0000 mg | Freq: Four times a day (QID) | INTRAMUSCULAR | Status: DC | PRN

## 2022-09-09 MED ORDER — PANTOPRAZOLE SODIUM 40 MG PO TBEC
40.0000 mg | DELAYED_RELEASE_TABLET | Freq: Two times a day (BID) | ORAL | Status: DC
  Administered 2022-09-10 – 2022-09-12 (×5): 40 mg via ORAL
  Filled 2022-09-09 (×5): qty 1

## 2022-09-09 MED ORDER — BUPROPION HCL ER (XL) 150 MG PO TB24
300.0000 mg | ORAL_TABLET | Freq: Every morning | ORAL | Status: DC
  Administered 2022-09-10 – 2022-09-12 (×3): 300 mg via ORAL
  Filled 2022-09-09 (×3): qty 2

## 2022-09-09 MED ORDER — DULOXETINE HCL 30 MG PO CPEP
90.0000 mg | ORAL_CAPSULE | Freq: Every day | ORAL | Status: DC
  Administered 2022-09-10 – 2022-09-12 (×3): 90 mg via ORAL
  Filled 2022-09-09 (×3): qty 3

## 2022-09-09 MED ORDER — CARIPRAZINE HCL 1.5 MG PO CAPS
1.5000 mg | ORAL_CAPSULE | Freq: Every day | ORAL | Status: DC
  Administered 2022-09-10 – 2022-09-12 (×3): 1.5 mg via ORAL
  Filled 2022-09-09 (×3): qty 1

## 2022-09-09 MED ORDER — LORAZEPAM 1 MG PO TABS
1.0000 mg | ORAL_TABLET | Freq: Once | ORAL | Status: AC
  Administered 2022-09-09: 1 mg via ORAL
  Filled 2022-09-09: qty 1

## 2022-09-09 MED ORDER — VANCOMYCIN HCL 750 MG/150ML IV SOLN
750.0000 mg | Freq: Three times a day (TID) | INTRAVENOUS | Status: DC
  Filled 2022-09-09 (×2): qty 150

## 2022-09-09 MED ORDER — LORAZEPAM 1 MG PO TABS
1.0000 mg | ORAL_TABLET | Freq: Once | ORAL | Status: AC
  Administered 2022-09-10: 1 mg via ORAL
  Filled 2022-09-09: qty 1

## 2022-09-09 MED ORDER — ARMODAFINIL 200 MG PO TABS: 1.0000 | ORAL_TABLET | Freq: Every morning | ORAL | Status: DC

## 2022-09-09 MED ORDER — ACETAMINOPHEN 650 MG RE SUPP: 650.0000 mg | Freq: Four times a day (QID) | RECTAL | Status: DC | PRN

## 2022-09-09 MED ORDER — IOHEXOL 300 MG/ML  SOLN
75.0000 mL | Freq: Once | INTRAMUSCULAR | Status: AC | PRN
  Administered 2022-09-09: 75 mL via INTRAVENOUS

## 2022-09-09 MED ORDER — DULOXETINE HCL 30 MG PO CPEP: 30.0000 mg | ORAL_CAPSULE | Freq: Every morning | ORAL | Status: DC

## 2022-09-09 MED ORDER — ACETAMINOPHEN 325 MG PO TABS
650.0000 mg | ORAL_TABLET | Freq: Four times a day (QID) | ORAL | Status: DC | PRN
  Administered 2022-09-10: 650 mg via ORAL
  Filled 2022-09-09: qty 2

## 2022-09-09 MED ORDER — MODAFINIL 100 MG PO TABS
200.0000 mg | ORAL_TABLET | Freq: Every day | ORAL | Status: DC
  Administered 2022-09-10 – 2022-09-12 (×3): 200 mg via ORAL
  Filled 2022-09-09 (×3): qty 2

## 2022-09-09 MED ORDER — DULOXETINE HCL 60 MG PO CPEP: 60.0000 mg | ORAL_CAPSULE | Freq: Every day | ORAL | Status: DC

## 2022-09-09 MED ORDER — ONDANSETRON 4 MG PO TBDP: 4.0000 mg | ORAL_TABLET | Freq: Once | ORAL | Status: DC

## 2022-09-09 MED ORDER — HEPARIN SODIUM (PORCINE) 5000 UNIT/ML IJ SOLN
5000.0000 [IU] | Freq: Once | INTRAMUSCULAR | Status: AC
  Administered 2022-09-10: 5000 [IU] via SUBCUTANEOUS
  Filled 2022-09-09: qty 1

## 2022-09-09 MED ORDER — PIPERACILLIN-TAZOBACTAM 3.375 G IVPB: 3.3750 g | Freq: Three times a day (TID) | INTRAVENOUS | Status: DC

## 2022-09-09 MED ORDER — POLYETHYLENE GLYCOL 3350 17 G PO PACK: 17.0000 g | PACK | Freq: Every day | ORAL | Status: DC | PRN

## 2022-09-09 MED ORDER — ONDANSETRON HCL 4 MG PO TABS: 4.0000 mg | ORAL_TABLET | Freq: Four times a day (QID) | ORAL | Status: DC | PRN

## 2022-09-09 MED ORDER — PIPERACILLIN-TAZOBACTAM 3.375 G IVPB 30 MIN
3.3750 g | Freq: Once | INTRAVENOUS | Status: AC
  Administered 2022-09-09: 3.375 g via INTRAVENOUS
  Filled 2022-09-09: qty 50

## 2022-09-09 MED ORDER — OXYCODONE HCL 5 MG PO TABS
10.0000 mg | ORAL_TABLET | Freq: Once | ORAL | Status: AC
  Administered 2022-09-09: 10 mg via ORAL
  Filled 2022-09-09: qty 2
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
## 2022-09-09 NOTE — ED Notes (Signed)
Pt does not want zofran at the time.  States she is not nausea at present

## 2022-09-09 NOTE — ED Provider Notes (Signed)
[place] EMERGENCY DEPARTMENT AT [facility] Provider Note   CSN: [id number] Arrival date & time: 09/09/22  1421     History  Chief Complaint  Patient presents with   Leg Pain    Taylor [name] is a 32 y.o. female with past medical history bipolar disorder, anxiety, chronic pain who presents to the ED complaining of significant swelling to the right lower extremity.  She states that she has a history of multiple ankle surgeries with hardware placement and at baseline has some swelling but never this severe.  She noticed it worsening a week ago and leg becoming erythematous.  No associated fever, chills, nausea, or vomiting.  She states that she is "clumsy" and has stumbled multiple times and could have injured it but not any specific mechanism of injury to her knowledge.  She denies history of blood clots.  She went to urgent care today and they noted they were unable to palpate a dose and sent patient to the ED for further evaluation.    Home Medications Prior to Admission medications   Medication Sig Start Date End Date Taking? Authorizing Provider  B Complex-C (SUPER B COMPLEX PO) Take 1 tablet by mouth in the morning.    [provider]  buPROPion (WELLBUTRIN XL) 300 MG 24 hr tablet Take 300 mg by mouth every morning. 07/05/21   [provider]  DULoxetine (CYMBALTA) 30 MG capsule Take 90 mg by mouth in the morning. 30 mg + 60 mg=90 mg daily    [provider]  lisinopril (ZESTRIL) 40 MG tablet Take 1 tablet (40 mg total) by mouth daily. 06/08/22   [name], FNP  mupirocin ointment (BACTROBAN) 2 % Apply 1 Application topically 2 (two) times daily. 07/20/22   [name], FNP  ondansetron (ZOFRAN) 4 MG tablet Take 1 tablet (4 mg total) by mouth every 8 (eight) hours as needed for nausea or vomiting. 07/20/22   [name] A, FNP  pantoprazole (PROTONIX) 40 MG tablet Take 1 tablet (40 mg total) by mouth 2 (two) times daily. 07/20/22    Hawks, Christy A, FNP  VRAYLAR 3 MG capsule Take 3 mg by mouth at bedtime. 06/24/21   [provider]      Allergies    Other    Review of Systems   Review of Systems  All other systems reviewed and are negative.   Physical Exam Updated Vital Signs BP (!) 157/92 (BP Location: Right Arm)   Pulse 98   Temp 98.3 F (36.8 C) (Oral)   Resp 20   SpO2 99%  Physical Exam Vitals and nursing note reviewed.  Constitutional:      General: She is not in acute distress.    Appearance: Normal appearance. She is not ill-appearing, toxic-appearing or diaphoretic.  HENT:     Head: Normocephalic and atraumatic.     Mouth/Throat:     Mouth: Mucous membranes are moist.  Eyes:     Conjunctiva/sclera: Conjunctivae normal.  Cardiovascular:     Rate and Rhythm: Normal rate and regular rhythm.     Heart sounds: No murmur heard. Pulmonary:     Effort: Pulmonary effort is normal.     Breath sounds: Normal breath sounds.  Abdominal:     General: Abdomen is flat.     Palpations: Abdomen is soft.     Tenderness: There is no abdominal tenderness.  Musculoskeletal:     Cervical back: Normal range of motion and neck supple.  Comments: Large amounts of swelling diffusely to the right foot and right ankle worse to the lateral aspect, palpable 1+ DP and PT pulses, normal sensation, able to wiggle toes with ease, minimal tenderness to palpation over dorsal foot and ankle, diffuse overlying erythema but no identifiable wounds, necrosis, or other skin changes and extremity is warm  Skin:    General: Skin is warm and dry.     Capillary Refill: Capillary refill takes less than 2 seconds.  Neurological:     Mental Status: She is alert. Mental status is at baseline.  Psychiatric:        Behavior: Behavior normal.     ED Results / Procedures / Treatments   Labs (all labs ordered are listed, but only abnormal results are displayed) Labs Reviewed  CBC WITH DIFFERENTIAL/PLATELET - Abnormal;  Notable for the following components:      Result Value   RBC 3.67 (*)    Hemoglobin 11.7 (*)    All other components within normal limits  COMPREHENSIVE METABOLIC PANEL - Abnormal; Notable for the following components:   Sodium 133 (*)    Glucose, Bld 124 (*)    All other components within normal limits  CULTURE, BLOOD (ROUTINE X 2)  CULTURE, BLOOD (ROUTINE X 2)  SEDIMENTATION RATE  LACTIC ACID, PLASMA  C-REACTIVE PROTEIN    EKG None  Radiology CT ANKLE RIGHT W CONTRAST  Result Date: 09/09/2022 CLINICAL DATA:  Foot pain and swelling. Conventional radiographs reveal fractured screws along the tibiotalar arthrodesis and possible bony erosion along the anterior plate. EXAM: CT OF THE RIGHT ANKLE WITH CONTRAST TECHNIQUE: Multidetector CT imaging of the right ankle was performed following the standard protocol during bolus administration of intravenous contrast. RADIATION DOSE REDUCTION: This exam was performed according to the departmental dose-optimization program which includes automated exposure control, adjustment of the mA and/or kV according to patient size and/or use of iterative reconstruction technique. CONTRAST:  75mL OMNIPAQUE IOHEXOL 300 MG/ML  SOLN COMPARISON:  Multiple exams, including radiographs of 09/09/2022 FINDINGS: Bones/Joint/Cartilage All 3 of the tibial screws of the tibiotalar plate and screw fixator are fractured. Moreover, there has been marked bony resorption of the talar dome compared to 10/23/2021 resulting in exposure and lucency around two of the central talar fixation screws. There is abnormal lucency tracking around the two talar head/neck screws indicating loosening or infection. There is also lucency around the volar plate with thick periosteal reaction surrounding the proximal 4.2 cm of the plate. Thick periostitis noted around the distal tibia, this could be a manifestation of chronic osteomyelitis. Cystic lesions and bony irregularity and resorption along the  tibial plafond and noted with marked loss of height and bony resorption of the talar dome and sclerosis in the remainder of the talus. No solid interbody bridging bone at the tibiotalar articulation. Chronic osteomyelitis involving the talus is likewise a distinct possibility. No complicating feature related to the two distal fibular retrograde cannulated screws. There is some residual deformity from prior fracture as well as small ossicles below the fibula, but no fibular nonunion. Mild proximal dorsal spurring of the navicular. There is posterior spurring of the posterior subtalar joint. Ligaments Suboptimally assessed by CT. Muscles and Tendons Abnormal thickened appearance of the tibialis anterior tendon volar to the tibiotalar articulation for example on image 97 series 7. Calcifications below the medial malleolus result in some indistinctness and displacement of the tibialis posterior tendon. Soft tissues Substantial volar edema tracking posterior and anterior to the tibialis anterior tendon  at the level of the tibiotalar articulation compatible with local inflammation and edema and more confluent than elsewhere. Strictly speaking I can not totally exclude the possibility of a draining sinus tract, correlate with any visible volar drainage along the ankle. There is prominent medial, lateral, and volar subcutaneous edema along the ankle and tracking into the dorsum of the foot. No drainable abscess. IMPRESSION: 1. 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 2. No solid interbody bridging bone at the tibiotalar articulation. 3. Abnormal thickened appearance of the tibialis anterior tendon volar  to the tibiotalar articulation. Substantial volar edema tracking posterior and anterior to the tibialis anterior tendon at the level of the tibiotalar articulation compatible with local inflammation and edema and more confluent than elsewhere. I can not totally exclude the possibility of a draining sinus tract, correlate with any visible volar drainage along the ankle. 4. Prominent medial, lateral, and volar subcutaneous edema along the ankle and tracking into the dorsum of the foot. 5. Mild posterior spurring of the posterior subtalar joint. 6. Mild proximal dorsal spurring of the navicular. Electronically Signed   By: Gaylyn Rong M.D.   On: 09/09/2022 18:30   CT FOOT RIGHT W CONTRAST  Result Date: 09/09/2022 CLINICAL DATA:  Chronic foot pain. Least partial failure of tibiotalar arthrodesis with new resorptive findings. EXAM: CT OF THE RIGHT FOOT WITH CONTRAST TECHNIQUE: Multidetector CT imaging of the lower right extremity was performed according to the standard protocol following intravenous contrast administration. RADIATION DOSE REDUCTION: This exam was performed according to the departmental dose-optimization program which includes automated exposure control, adjustment of the mA and/or kV according to patient size and/or use of iterative reconstruction technique. CONTRAST:  75mL OMNIPAQUE IOHEXOL 300 MG/ML  SOLN COMPARISON:  Radiographs 09/09/2022 FINDINGS: Bones/Joint/Cartilage Imaging planes for the dedicated foot exam are chosen with regard to the midfoot and forefoot rather than the hindfoot. Hindfoot foot findings will be discussed on the dedicated ankle CT. There are mild degenerative findings along the first metatarsal head the adjacent sesamoids. No malalignment at the Lisfranc joint. The phalanges and metatarsals appear otherwise unremarkable. Small os peroneus noted. There is some bony demineralization observed particularly in the midfoot which may reflect osteoporosis of disuse. Ligaments  Suboptimally assessed by CT. Muscles and Tendons Mild flexor digitorum longus tenosynovitis just past the knot of Henry. Soft tissues Substantial subcutaneous edema tracks dorsally along the forefoot, and to a lesser extent along the lateral plantar ball of the foot. This is nonspecific with regard to cellulitis versus sterile edema. No abscess identified. IMPRESSION: 1. Substantial subcutaneous edema tracks dorsally along the forefoot, and to a lesser extent along the lateral plantar ball of the foot. This is nonspecific with regard to cellulitis versus sterile edema. No abscess identified. 2. Mild flexor digitorum longus tenosynovitis just past the knot of Henry. 3. Mild degenerative findings along the first metatarsal head and adjacent sesamoids. 4. Bony demineralization particularly in the midfoot may reflect osteoporosis of disuse. 5. Please note, tibiotalar and hindfoot related findings are communicated under the dedicated CT ankle report. Electronically Signed   By: Gaylyn Rong M.D.   On: 09/09/2022 18:18   US Venous Img Lower Right (DVT Study)  Result Date: 09/09/2022 CLINICAL DATA:  Pain and swelling EXAM: Right LOWER EXTREMITY VENOUS DOPPLER ULTRASOUND TECHNIQUE: Gray-scale sonography with compression, as well as color and duplex ultrasound, were performed to evaluate the deep venous system(s) from the level of the common femoral vein through the popliteal and proximal calf veins. COMPARISON:  None Available. FINDINGS: VENOUS Normal compressibility of the common femoral, superficial femoral, and popliteal veins, as well as the visualized calf veins. Visualized portions of profunda femoral vein and great saphenous vein unremarkable. No filling defects to suggest DVT on grayscale or color Doppler imaging. Doppler waveforms show normal direction of venous flow, normal respiratory plasticity and response to augmentation. Limited views of the contralateral common femoral vein are unremarkable. OTHER  There is a reniform shaped lymph node in the right inguinal region with fatty hilum suggesting possible reactive hyperplasia of lymph node. There is edema in subcutaneous plane in the right lower leg without any loculated fluid collections. Limitations: none IMPRESSION: There is no evidence of deep venous thrombosis in right lower extremity. Electronically Signed   By: Ernie Avena M.D.   On: 09/09/2022 15:58   DG Foot Complete Right  Result Date: 09/09/2022 CLINICAL DATA:  Lower extremity edema EXAM: RIGHT FOOT COMPLETE - 3+ VIEW; RIGHT ANKLE - COMPLETE 3+ VIEW COMPARISON:  09/05/2021 FINDINGS: Prior tibiotalar arthrodesis with anterior plate and screw construct. There is a new fracture through the proximal most screw within the distal tibial metadiaphysis. The additional tibial screws are chronically fractured. Increasing perihardware lucency about the screws within the talar dome, which appear to be backing out. There is increasing lucency around the tibial plate. Additional screws within the posterior talus appear intact. Intact lateral malleolar screws. Erosive or resorptive changes within the talar dome and tibial plafond, new from prior. No evidence of acute fracture or dislocation. Marked diffuse soft tissue swelling. IMPRESSION: 1. Prior tibiotalar arthrodesis with evidence of hardware loosening including multiple fractured screws in the tibia and increasing perihardware lucency. 2. Erosive or resorptive changes within the talar dome and tibial plafond, new from prior. Correlate for signs of infection as septic arthritis and osteomyelitis could have this appearance. 3. Marked diffuse soft tissue swelling of the right foot and ankle. Electronically Signed   By: Duanne Guess D.O.   On: 09/09/2022 15:39   DG Ankle Complete Right  Result Date: 09/09/2022 CLINICAL DATA:  Lower extremity edema EXAM: RIGHT FOOT COMPLETE - 3+ VIEW; RIGHT ANKLE - COMPLETE 3+ VIEW COMPARISON:  09/05/2021 FINDINGS:  Prior tibiotalar arthrodesis with anterior plate and screw construct. There is a new fracture through the proximal most screw within the distal tibial metadiaphysis. The additional tibial screws are chronically fractured. Increasing perihardware lucency about the screws within the talar dome, which appear to be backing out. There is increasing lucency around the tibial plate. Additional screws within the posterior talus appear intact. Intact lateral malleolar screws. Erosive or resorptive changes within the talar dome and tibial plafond, new from prior. No evidence of acute fracture or dislocation. Marked diffuse soft tissue swelling. IMPRESSION: 1. Prior tibiotalar arthrodesis with evidence of hardware loosening including multiple fractured screws in the tibia and increasing perihardware lucency. 2. Erosive or resorptive changes within the talar dome and tibial plafond, new from prior. Correlate for signs of infection as septic arthritis and osteomyelitis could have this appearance. 3. Marked diffuse soft tissue swelling of the right foot and ankle. Electronically Signed   By: Duanne Guess D.O.   On: 09/09/2022 15:39    Procedures Procedures    Medications  Ordered in ED Medications  ondansetron (ZOFRAN-ODT) disintegrating tablet 4 mg (has no administration in time range)  oxyCODONE (Oxy IR/ROXICODONE) immediate release tablet 10 mg (10 mg Oral Given 09/09/22 1556)  LORazepam (ATIVAN) tablet 1 mg (1 mg Oral Given 09/09/22 1653)  iohexol (OMNIPAQUE) 300 MG/ML solution 75 mL (75 mLs Intravenous Contrast Given 09/09/22 1810)    ED Course/ Medical Decision Making/ A&P                             Medical Decision Making Amount and/or Complexity of Data Reviewed Labs: ordered. Decision-making details documented in ED Course. Radiology: ordered. Decision-making details documented in ED Course.  Risk Prescription drug management. Decision regarding hospitalization.   Medical Decision Making:    Taylor [name] is a 32 y.o. female who presented to the ED today with right lower extremity swelling detailed above.    Patient's presentation is complicated by their history of multiple surgeries to the right ankle and foot.  Complete initial physical exam performed, notably the patient was in no acute distress.  She did have significant swelling to the right ankle and foot.  Palpable pulses 1+ DP and PT.  Overlying erythema to the ankle and foot.  Able to wiggle toes but otherwise range of motion restricted secondary to swelling.    Reviewed and confirmed nursing documentation for past medical history, family history, social history.    Initial Assessment:   With the patient's presentation, differential diagnosis includes but is not limited to cellulitis, DVT, vascular insufficiency, osteomyelitis.  This is most consistent with an acute complicated illness  Initial Plan:  Screening labs including CBC and Metabolic panel to evaluate for infectious or metabolic etiology of disease.  Ankle/foot XR to evaluate for underlying infectious process [place] to rule out DVT Symptomatic management Objective evaluation as below reviewed   Initial Study Results:   Laboratory  All laboratory results reviewed without evidence of clinically relevant pathology.   Exceptions include: Na 133, Hgb 11.7   Radiology:  All images reviewed independently. Agree with radiology report at this time.   CT ANKLE RIGHT W CONTRAST  Result Date: 09/09/2022 CLINICAL DATA:  Foot pain and swelling. Conventional radiographs reveal fractured screws along the tibiotalar arthrodesis and possible bony erosion along the anterior plate. EXAM: CT OF THE RIGHT ANKLE WITH CONTRAST TECHNIQUE: Multidetector CT imaging of the right ankle was performed following the standard protocol during bolus administration of intravenous contrast. RADIATION DOSE REDUCTION: This exam was performed according to the departmental dose-optimization program  which includes automated exposure control, adjustment of the mA and/or kV according to patient size and/or use of iterative reconstruction technique. CONTRAST:  75mL OMNIPAQUE IOHEXOL 300 MG/ML  SOLN COMPARISON:  Multiple exams, including radiographs of 09/09/2022 FINDINGS: Bones/Joint/Cartilage All 3 of the tibial screws of the tibiotalar plate and screw fixator are fractured. Moreover, there has been marked bony resorption of the talar dome compared to 10/23/2021 resulting in exposure and lucency around two of the central talar fixation screws. There is abnormal lucency tracking around the two talar head/neck screws indicating loosening or infection. There is also lucency around the volar plate with thick periosteal reaction surrounding the proximal 4.2 cm of the plate. Thick periostitis noted around the distal tibia, this could be a manifestation of chronic osteomyelitis. Cystic lesions and bony irregularity and resorption along the tibial plafond and noted with marked loss of height and bony resorption of the talar dome and  sclerosis in the remainder of the talus. No solid interbody bridging bone at the tibiotalar articulation. Chronic osteomyelitis involving the talus is likewise a distinct possibility. No complicating feature related to the two distal fibular retrograde cannulated screws. There is some residual deformity from prior fracture as well as small ossicles below the fibula, but no fibular nonunion. Mild proximal dorsal spurring of the navicular. There is posterior spurring of the posterior subtalar joint. Ligaments Suboptimally assessed by CT. Muscles and Tendons Abnormal thickened appearance of the tibialis anterior tendon volar to the tibiotalar articulation for example on image 97 series 7. Calcifications below the medial malleolus result in some indistinctness and displacement of the tibialis posterior tendon. Soft tissues Substantial volar edema tracking posterior and anterior to the tibialis  anterior tendon at the level of the tibiotalar articulation compatible with local inflammation and edema and more confluent than elsewhere. Strictly speaking I can not totally exclude the possibility of a draining sinus tract, correlate with any visible volar drainage along the ankle. There is prominent medial, lateral, and volar subcutaneous edema along the ankle and tracking into the dorsum of the foot. No drainable abscess. IMPRESSION: 1. 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 2. No solid interbody bridging bone at the tibiotalar articulation. 3. Abnormal thickened appearance of the tibialis anterior tendon volar to the tibiotalar articulation. Substantial volar edema tracking posterior and anterior to the tibialis anterior tendon at the level of the tibiotalar articulation compatible with local inflammation and edema and more confluent than elsewhere. I can not totally exclude the possibility of a draining sinus tract, correlate with any visible volar drainage along the ankle. 4. Prominent medial, lateral, and volar subcutaneous edema along the ankle and tracking into the dorsum of the foot. 5. Mild posterior spurring of the posterior subtalar joint. 6. Mild proximal dorsal spurring of the navicular. Electronically Signed   By: Gaylyn Rong M.D.   On: 09/09/2022 18:30   CT FOOT RIGHT W CONTRAST  Result Date: 09/09/2022 CLINICAL DATA:  Chronic foot pain. Least partial failure of tibiotalar arthrodesis with new resorptive findings. EXAM: CT OF THE RIGHT FOOT WITH CONTRAST TECHNIQUE: Multidetector CT imaging of the lower right extremity was performed according to the standard protocol  following intravenous contrast administration. RADIATION DOSE REDUCTION: This exam was performed according to the departmental dose-optimization program which includes automated exposure control, adjustment of the mA and/or kV according to patient size and/or use of iterative reconstruction technique. CONTRAST:  75mL OMNIPAQUE IOHEXOL 300 MG/ML  SOLN COMPARISON:  Radiographs 09/09/2022 FINDINGS: Bones/Joint/Cartilage Imaging planes for the dedicated foot exam are chosen with regard to the midfoot and forefoot rather than the hindfoot. Hindfoot foot findings will be discussed on the dedicated ankle CT. There are mild degenerative findings along the first metatarsal head the adjacent sesamoids. No malalignment at the Lisfranc joint. The phalanges and metatarsals appear otherwise unremarkable. Small os peroneus noted. There is some bony demineralization observed particularly in the midfoot which may reflect osteoporosis of disuse. Ligaments Suboptimally assessed by CT. Muscles and Tendons Mild flexor digitorum longus tenosynovitis just past the knot of Henry. Soft tissues Substantial subcutaneous edema tracks dorsally along the forefoot, and to a lesser extent along the lateral plantar ball of the  foot. This is nonspecific with regard to cellulitis versus sterile edema. No abscess identified. IMPRESSION: 1. Substantial subcutaneous edema tracks dorsally along the forefoot, and to a lesser extent along the lateral plantar ball of the foot. This is nonspecific with regard to cellulitis versus sterile edema. No abscess identified. 2. Mild flexor digitorum longus tenosynovitis just past the knot of Henry. 3. Mild degenerative findings along the first metatarsal head and adjacent sesamoids. 4. Bony demineralization particularly in the midfoot may reflect osteoporosis of disuse. 5. Please note, tibiotalar and hindfoot related findings are communicated under the dedicated CT ankle report. Electronically Signed   By: Gaylyn Rong M.D.   On: 09/09/2022 18:18   US Venous Img Lower Right (DVT Study)  Result Date: 09/09/2022 CLINICAL DATA:  Pain and swelling EXAM: Right LOWER EXTREMITY VENOUS DOPPLER ULTRASOUND TECHNIQUE: Gray-scale sonography with compression, as well as color and duplex ultrasound, were performed to evaluate the deep venous system(s) from the level of the common femoral vein through the popliteal and proximal calf veins. COMPARISON:  None Available. FINDINGS: VENOUS Normal compressibility of the common femoral, superficial femoral, and popliteal veins, as well as the visualized calf veins. Visualized portions of profunda femoral vein and great saphenous vein unremarkable. No filling defects to suggest DVT on grayscale or color Doppler imaging. Doppler waveforms show normal direction of venous flow, normal respiratory plasticity and response to augmentation. Limited views of the contralateral common femoral vein are unremarkable. OTHER There is a reniform shaped lymph node in the right inguinal region with fatty hilum suggesting possible reactive hyperplasia of lymph node. There is edema in subcutaneous plane in the right lower leg without any loculated fluid collections. Limitations: none IMPRESSION: There is no evidence of deep venous thrombosis in right lower extremity. Electronically Signed   By: Ernie Avena M.D.   On: 09/09/2022 15:58   DG Foot Complete Right  Result Date: 09/09/2022 CLINICAL DATA:  Lower extremity edema EXAM: RIGHT FOOT COMPLETE - 3+ VIEW; RIGHT ANKLE - COMPLETE 3+ VIEW COMPARISON:  09/05/2021 FINDINGS: Prior tibiotalar arthrodesis with anterior plate and screw construct. There is a new fracture through the proximal most screw within the distal tibial metadiaphysis. The additional tibial screws are chronically fractured. Increasing perihardware lucency about the screws within the talar dome, which appear to be backing out. There is increasing lucency around the tibial plate.  Additional screws within the posterior talus appear intact. Intact lateral malleolar screws. Erosive or resorptive changes within the talar dome and tibial plafond, new from prior. No evidence of acute fracture or dislocation. Marked diffuse soft tissue swelling. IMPRESSION: 1. Prior tibiotalar arthrodesis with evidence of hardware loosening including multiple fractured screws in the tibia and increasing perihardware lucency. 2. Erosive or resorptive changes within the talar dome and tibial plafond, new from prior. Correlate for signs of infection as septic arthritis and osteomyelitis could have this appearance. 3. Marked diffuse soft tissue swelling of the right foot and ankle. Electronically Signed   By: Duanne Guess D.O.   On: 09/09/2022 15:39   DG Ankle Complete Right  Result Date: 09/09/2022 CLINICAL DATA:  Lower extremity edema EXAM: RIGHT FOOT COMPLETE - 3+ VIEW; RIGHT ANKLE - COMPLETE 3+ VIEW COMPARISON:  09/05/2021 FINDINGS: Prior tibiotalar arthrodesis with anterior plate and screw construct. There is a new fracture through the proximal most screw within the distal tibial metadiaphysis. The additional tibial screws are chronically fractured. Increasing perihardware lucency about the screws within the talar dome, which appear to be backing  out. There is increasing lucency around the tibial plate. Additional screws within the posterior talus appear intact. Intact lateral malleolar screws. Erosive or resorptive changes within the talar dome and tibial plafond, new from prior. No evidence of acute fracture or dislocation. Marked diffuse soft tissue swelling. IMPRESSION: 1. Prior tibiotalar arthrodesis with evidence of hardware loosening including multiple fractured screws in the tibia and increasing perihardware lucency. 2. Erosive or resorptive changes within the talar dome and tibial plafond, new from prior. Correlate for signs of infection as septic arthritis and osteomyelitis could have this  appearance. 3. Marked diffuse soft tissue swelling of the right foot and ankle. Electronically Signed   By: Duanne Guess D.O.   On: 09/09/2022 15:39      Consults: Case discussed with hospitalist who will admit patient.   Final Assessment and Plan:   32 year old female presents to the ED complaining of acute swelling to the right ankle and foot.  History of multiple fusions to the right ankle.  Last was over a year ago.  She notes that she is "clumsy" and may have injured the ankle and foot but no specific injury or mechanism of injury in mind that led to onset of symptoms.  No history of DVT.  She does have significant swelling and erythema over the right ankle and foot.  Pulses palpable.  Sensation intact.  Workup initiated as above for further evaluation of symptoms.  DVT ultrasound negative.  X-ray with finding he needs concerning for septic arthritis versus osteomyelitis.  Patient afebrile, vital signs unremarkable apart from being slightly hypertensive.  I do have concern for underlying infection with patient's presentation so CT obtained for further evaluation.  No leukocytosis, lab work essentially unremarkable.  Unable to obtain MRI due to prior orthopedic hardware placements.  CT with contrast ordered for further evaluation.  Shows suspected distal tibial and talar osteomyelitis. Lactic acid normal, low suspicion for septic joint, findings seem more consistent with osteomyelitis. With this, will consult for admission.    Clinical Impression:  1. Osteomyelitis of right ankle, unspecified type (HCC)      Admit           Final Clinical Impression(s) / ED Diagnoses Final diagnoses:  Osteomyelitis of right ankle, unspecified type [facility])    Rx / DC Orders ED Discharge Orders     None         Richardson Dopp 09/09/22 1914    Jacalyn Lefevre, MD 09/09/22 2329

## 2022-09-09 NOTE — ED Notes (Signed)
Patient is being discharged from the Urgent Care and sent to the Emergency Department via POV . Per NP, patient is in need of higher level of care due to leg/ankle edema post surgical. Patient is aware and verbalizes understanding of plan of care.  Vitals:   09/09/22 1325  BP: 130/82  Pulse: 97  Resp: 15  Temp: 97.9 F (36.6 C)  SpO2: 98%

## 2022-09-09 NOTE — Telephone Encounter (Signed)
Pt called stating urgent care is sending her to hospital for swollen right leg. Pt phone number is [phone].

## 2022-09-09 NOTE — ED Notes (Signed)
[facility] [place]  No changes in assessment.

## 2022-09-09 NOTE — ED Triage Notes (Signed)
Pt c/o right leg pain x 4 days swelling pt states she has had surgery on her ankle

## 2022-09-09 NOTE — Assessment & Plan Note (Signed)
Hold lisinopril for contrast exposure
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
## 2022-09-09 NOTE — Assessment & Plan Note (Addendum)
Complicates overall care.

## 2022-09-09 NOTE — Progress Notes (Signed)
Pharmacy Antibiotic Note  Taylor [name] is a 32 y.o. female admitted on 09/09/2022 with  osteomyelitis . Pharmacy has been consulted for vancomycin and Zosyn dosing. Cr <1.   Plan: Zosyn 3.375g IV over 30 min x1 then EI q8h Vancomycin 2000mg  IV x1 then 750mg  IV q8h - est AUC ~500 VT at Css     Temp (24hrs), Avg:98.1 F (36.7 C), Min:97.9 F (36.6 C), Max:98.3 F (36.8 C)  Recent Labs  Lab 09/09/22 1523 09/09/22 1645  WBC 8.1  --   CREATININE 0.71  --   LATICACIDVEN  --  1.1    CrCl cannot be calculated (Unknown ideal weight.).    Allergies  Allergen Reactions   Other Itching    Animal Dander     Thank you for allowing pharmacy to be a part of this patient's care.  Mosetta Anis 09/09/2022 7:03 PM

## 2022-09-09 NOTE — Telephone Encounter (Signed)
noted 

## 2022-09-09 NOTE — ED Notes (Signed)
Assist pt with WC up to BR

## 2022-09-09 NOTE — Assessment & Plan Note (Addendum)
Marked swelling to right lower extremity.  Reports initial trauma to same foot about age 32, with subsequent multiple surgeries.  Follows with Dr. Lajoyce Corners, Ortho care.  CT foot and ankle-suspected distal tibia and talar chronic osteomyelitis fracture of the 3 tibial screw.  Cannot rule out draining sinus tract.  At this time she rules out for sepsis, she is anxious but well-appearing. Afebrile.  WBC 8.1.  ESR normal at 19.  Lactic acid 1.1.  Not diabetic. -Admit to [facility], to see her orthopedic team - Ortho care, Dr. Lajoyce Corners -IV Vanco and Zosyn started in ED, hold further antibiotics for now pending Ortho eval -Follow-up CRP -Follow-up blood cultures -N.p.o. at midnight pending Ortho eval -I Called [place]- OrthCare Team on call at [facility], they recommended hospitalist team re-consult in the morning. - HGba1c -Ibuprofen 600 mg every 8 hourly

## 2022-09-09 NOTE — ED Notes (Signed)
Patient transported to Ultrasound 

## 2022-09-09 NOTE — Assessment & Plan Note (Signed)
Resume bupropion, Cymbalta, Vraylar

## 2022-09-09 NOTE — Discharge Instructions (Signed)
Go to the ER for further evaluation

## 2022-09-09 NOTE — ED Triage Notes (Signed)
Patient has had several surgeries on right foot and one on left foot. Right foot was fused together about a year ago. Leg is swollen for several days and got a friend to take her to Urgent care and they sent her here to the ED.

## 2022-09-10 DIAGNOSIS — M86171 Other acute osteomyelitis, right ankle and foot: Secondary | ICD-10-CM | POA: Diagnosis not present

## 2022-09-10 LAB — BASIC METABOLIC PANEL
Anion gap: 8 (ref 5–15)
BUN: 5 mg/dL — ABNORMAL LOW (ref 6–20)
CO2: 29 mmol/L (ref 22–32)
Calcium: 9.2 mg/dL (ref 8.9–10.3)
Chloride: 101 mmol/L (ref 98–111)
Creatinine, Ser: 0.8 mg/dL (ref 0.44–1.00)
GFR, Estimated: >60 mL/min (ref >60)
Glucose, Bld: 105 mg/dL — ABNORMAL HIGH (ref 70–99)
Potassium: 3.8 mmol/L (ref 3.5–5.1)
Sodium: 138 mmol/L (ref 135–145)

## 2022-09-10 LAB — CBC
HCT: 32.8 % — ABNORMAL LOW (ref 36.0–46.0)
Hemoglobin: 10.9 g/dL — ABNORMAL LOW (ref 12.0–15.0)
MCH: 32.3 pg (ref 26.0–34.0)
MCHC: 33.2 g/dL (ref 30.0–36.0)
MCV: 97.3 fL (ref 80.0–100.0)
Platelets: 230 10*3/uL (ref 150–400)
RBC: 3.37 MIL/uL — ABNORMAL LOW (ref 3.87–5.11)
RDW: 14.3 % (ref 11.5–15.5)
WBC: 7.4 10*3/uL (ref 4.0–10.5)
nRBC: 0 % (ref 0.0–0.2)

## 2022-09-10 LAB — HIV ANTIBODY (ROUTINE TESTING W REFLEX): HIV Screen 4th Generation wRfx: NONREACTIVE

## 2022-09-10 LAB — HEMOGLOBIN A1C
Hgb A1c MFr Bld: 5.4 % (ref 4.8–5.6)
Mean Plasma Glucose: 108.28 mg/dL

## 2022-09-10 MED ORDER — HYDROMORPHONE HCL 1 MG/ML IJ SOLN
0.5000 mg | INTRAMUSCULAR | Status: DC | PRN
  Administered 2022-09-10 – 2022-09-11 (×4): 0.5 mg via INTRAVENOUS
  Filled 2022-09-10 (×4): qty 0.5

## 2022-09-10 MED ORDER — ACETAMINOPHEN 500 MG PO TABS
1000.0000 mg | ORAL_TABLET | Freq: Three times a day (TID) | ORAL | Status: DC
  Administered 2022-09-10 – 2022-09-12 (×5): 1000 mg via ORAL
  Filled 2022-09-10 (×5): qty 2

## 2022-09-10 MED ORDER — OXYCODONE HCL 5 MG PO TABS
5.0000 mg | ORAL_TABLET | Freq: Four times a day (QID) | ORAL | Status: DC
  Administered 2022-09-10 – 2022-09-12 (×9): 5 mg via ORAL
  Filled 2022-09-10 (×9): qty 1

## 2022-09-10 NOTE — TOC Initial Note (Signed)
Transition of Care (TOC) - Initial/Assessment Note   Spoke to patient at bedside. Patient confirmed face sheet information. Her father is currently staying with her and assisting her.   Her apartment is on the second story. She does not have grab bars in the bathroom. She has a cane, requesting shower chair . NCM explained PT will work with her post op and make recommendations. TOC Team will follow up with patient after PT recommendations made.  Patient Details  Name: Taylor [name] MRN: [id number] Date of Birth: 04-14-1991  Transition of Care [facility]) CM/SW Contact:    Kingsley Plan, RN Phone Number: 09/10/2022, 4:26 PM  Clinical Narrative:                   Expected Discharge Plan:  (await PT eval)     Patient Goals and CMS Choice Patient states their goals for this hospitalization and ongoing recovery are:: to return to home     [place] ownership interest in [facility]provided to:: Patient    Expected Discharge Plan and Services   Discharge Planning Services: CM Consult                       DME Agency: NA       HH Arranged:  (await PT eval)          Prior Living Arrangements/Services   Lives with:: Self Patient language and need for interpreter reviewed:: Yes Do you feel safe going back to the place where you live?: Yes      Need for Family Participation in Patient Care: Yes (Comment) Care giver support system in place?: Yes (comment) Current home services: DME (cane) Criminal Activity/Legal Involvement Pertinent to Current Situation/Hospitalization: No - Comment as needed  Activities of Daily Living   ADL Screening (condition at time of admission) Patient's cognitive ability adequate to safely complete daily activities?: Yes Is the patient deaf or have difficulty hearing?: No Does the patient have difficulty seeing, even when wearing glasses/contacts?: No Does the patient have difficulty concentrating, remembering, or making  decisions?: No Patient able to express need for assistance with ADLs?: No Does the patient have difficulty dressing or bathing?: No Independently performs ADLs?: Yes (appropriate for developmental age) Does the patient have difficulty walking or climbing stairs?: Yes Weakness of Legs: Both Weakness of Arms/Hands: None  Permission Sought/Granted   Permission granted to share information with : No              Emotional Assessment Appearance:: Appears stated age Attitude/Demeanor/Rapport: Engaged Affect (typically observed): Accepting Orientation: : Oriented to Self, Oriented to Place, Oriented to  Time, Oriented to Situation Alcohol / Substance Use: Not Applicable Psych Involvement: No (comment)  Admission diagnosis:  Acute osteomyelitis of right foot (HCC) [M86.171] Osteomyelitis of right ankle, unspecified type [facility]) [M86.9] Patient Active Problem List   Diagnosis Date Noted   Acute osteomyelitis of right foot (HCC) 09/09/2022   HTN (hypertension) 09/09/2022   H/O ankle fusion 03/11/2022   Prolapsed internal hemorrhoids, grade 2 09/16/2021   Displaced pilon fracture of right tibia, initial encounter for closed fracture    Lymphocytic colitis 05/16/2021   Bloating 11/28/2020   Incontinence of feces with fecal urgency 11/15/2020   Sprain of anterior talofibular ligament of right ankle    Impingement of right ankle joint    PTSD (post-traumatic stress disorder) 10/04/2020   Migraine without aura and without status migrainosus, not intractable 10/04/2020   Hemorrhoids 01/30/2020  Leukocytosis 09/20/2019   Pain in left foot 09/14/2019   Chronic depression 08/04/2019   Chronic back pain 08/04/2019   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HCC) 08/04/2019   Lumbar radiculopathy 08/04/2019   IUD (intrauterine device) in place 04/27/2019   Morbid obesity (HCC) 01/23/2019   H/O Abnormal LFTs 10/17/2018   Bicuspid aortic valve    GAD (generalized anxiety disorder) 06/30/2018    GERD (gastroesophageal reflux disease) 06/06/2018   Current smoker 06/06/2018   Depression, major, single episode, moderate (HCC) 06/06/2018   PCP:  [name], FNP Pharmacy:   [facility] [phone] - [place], [place] - [address]  [place] [address] Phone: [phone] Fax: [phone]     Social Determinants of Health (SDOH) Social History: SDOH Screenings   Food Insecurity: No Food Insecurity (03/25/2019)  Transportation Needs: No Transportation Needs (03/25/2019)  Depression (PHQ2-9): High Risk (05/25/2022)  Financial Resource Strain: Low Risk  (03/25/2019)  Tobacco Use: Medium Risk (09/09/2022)   SDOH Interventions:     Readmission Risk Interventions     No data to display

## 2022-09-10 NOTE — Progress Notes (Signed)
Pt c/o of R foot pain. PT education provided. Diet clear liquid now then NPO after midnight. Pt verbalizes understanding.
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
## 2022-09-10 NOTE — Progress Notes (Signed)
Patient complain of nose bleed,no bleeding now,on call provider informed.

## 2022-09-10 NOTE — Progress Notes (Signed)
Patient transfer from [facility] and oriented,able to make needs known,patient assessment done,made comfortable in room,call light in reach and bed in low position,will continue to monitor.

## 2022-09-11 DIAGNOSIS — M86171 Other acute osteomyelitis, right ankle and foot: Secondary | ICD-10-CM | POA: Diagnosis not present

## 2022-09-11 LAB — CULTURE, BLOOD (ROUTINE X 2)
Special Requests: ADEQUATE
Special Requests: ADEQUATE

## 2022-09-11 NOTE — Care Management Important Message (Signed)
Important Message  Patient Details  Name: ALEICIA BADALAMENTI MRN: [id number] Date of Birth: 03/07/1991   Medicare Important Message Given:  Yes     Sherilyn Banker 09/11/2022, 4:08 PM
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
## 2022-09-12 DIAGNOSIS — M869 Osteomyelitis, unspecified: Secondary | ICD-10-CM

## 2022-09-12 DIAGNOSIS — M14671 Charcot's joint, right ankle and foot: Secondary | ICD-10-CM | POA: Diagnosis not present

## 2022-09-12 DIAGNOSIS — M86171 Other acute osteomyelitis, right ankle and foot: Secondary | ICD-10-CM | POA: Diagnosis not present

## 2022-09-12 LAB — CULTURE, BLOOD (ROUTINE X 2): Culture: NO GROWTH

## 2022-09-12 MED ORDER — DOXYCYCLINE HYCLATE 100 MG PO TABS: 100.0000 mg | ORAL_TABLET | Freq: Two times a day (BID) | ORAL | 0 refills | Status: DC

## 2022-09-12 MED ORDER — OXYCODONE HCL 5 MG PO TABS: 5.0000 mg | ORAL_TABLET | Freq: Four times a day (QID) | ORAL | 0 refills | Status: DC

## 2022-09-12 NOTE — Progress Notes (Signed)
No new events overnight. R foot still swollen and red. Skin warm. R Pedal pulse is present. Pt pain controlled with 1 mg of dilaudid and scheduled Oxy.
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
## 2022-09-12 NOTE — Progress Notes (Signed)
Orthopedic Tech Progress Note Patient Details:  Taylor [name] [birth year] [id number] Patient was unable to have a CAM Walker applied.  Ortho Devices Type of Ortho Device: CAM walker Ortho Device/Splint Location: RLE Ortho Device/Splint Interventions: Application   Post Interventions Patient Tolerated: Refused intervention  [name] E [name] 09/12/2022, 12:51 PM

## 2022-09-12 NOTE — Progress Notes (Addendum)
Pt was seen by Dr. Lajoyce Corners and DC order was placed. Pt needs to be on Cam walker, ortho tech was called in. Ortho tech said that Cam walker will not be appropriate for the pt, Dr. Lajoyce Corners was informed by the ortho tech and said pt is ok to be DC without a Cam walker. AVS was given and explained to pt, all questions were answered. Follow up with Dr. Lajoyce Corners in 2 day for surgery. Friend will come to drive pt home.
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
## 2022-09-15 ENCOUNTER — Encounter (HOSPITAL_COMMUNITY): Payer: Self-pay | Admitting: Orthopedic Surgery

## 2022-09-15 ENCOUNTER — Telehealth (INDEPENDENT_AMBULATORY_CARE_PROVIDER_SITE_OTHER): Payer: Medicare Other | Admitting: Family

## 2022-09-15 ENCOUNTER — Encounter: Payer: Self-pay | Admitting: Family

## 2022-09-15 ENCOUNTER — Encounter: Payer: Self-pay | Admitting: Orthopedic Surgery

## 2022-09-15 ENCOUNTER — Ambulatory Visit (INDEPENDENT_AMBULATORY_CARE_PROVIDER_SITE_OTHER): Payer: Medicare Other | Admitting: Orthopedic Surgery

## 2022-09-15 ENCOUNTER — Other Ambulatory Visit: Payer: Self-pay

## 2022-09-15 DIAGNOSIS — F410 Panic disorder [episodic paroxysmal anxiety] without agoraphobia: Secondary | ICD-10-CM | POA: Diagnosis not present

## 2022-09-15 DIAGNOSIS — M14671 Charcot's joint, right ankle and foot: Secondary | ICD-10-CM

## 2022-09-15 DIAGNOSIS — M86171 Other acute osteomyelitis, right ankle and foot: Secondary | ICD-10-CM

## 2022-09-15 DIAGNOSIS — F411 Generalized anxiety disorder: Secondary | ICD-10-CM | POA: Diagnosis not present

## 2022-09-15 DIAGNOSIS — M009 Pyogenic arthritis, unspecified: Secondary | ICD-10-CM | POA: Diagnosis not present

## 2022-09-15 DIAGNOSIS — F321 Major depressive disorder, single episode, moderate: Secondary | ICD-10-CM

## 2022-09-15 LAB — CULTURE, BLOOD (ROUTINE X 2)

## 2022-09-15 NOTE — Progress Notes (Signed)
Virtual Visit Consent   Taylor [name], you are scheduled for a virtual visit with a [facility] provider today. Just as with appointments in the office, your consent must be obtained to participate. Your consent will be active for this visit and any virtual visit you may have with one of our providers in the next 365 days. If you have a MyChart account, a copy of this consent can be sent to you electronically.  As this is a virtual visit, video technology does not allow for your provider to perform a traditional examination. This may limit your provider's ability to fully assess your condition. If your provider identifies any concerns that need to be evaluated in person or the need to arrange testing (such as labs, EKG, etc.), we will make arrangements to do so. Although advances in technology are sophisticated, we cannot ensure that it will always work on either your end or our end. If the connection with a video visit is poor, the visit may have to be switched to a telephone visit. With either a video or telephone visit, we are not always able to ensure that we have a secure connection.  By engaging in this virtual visit, you consent to the provision of healthcare and authorize for your insurance to be billed (if applicable) for the services provided during this visit. Depending on your insurance coverage, you may receive a charge related to this service.  I need to obtain your verbal consent now. Are you willing to proceed with your visit today? Taylor [name] has provided verbal consent on 09/15/2022 for a virtual visit (video or telephone). [name], FNP  Date: 09/15/2022 1:46 PM  Virtual Visit via Video Note   I, [name], connected with  Taylor [name]  ([id number], April 18, 1991) on 09/15/22 at 12:10 PM EDT by a video-enabled telemedicine application and verified that I am speaking with the correct person using two identifiers.  Location: Patient: Virtual Visit Location  Patient: Other: car Provider: Virtual Visit Location Provider: Office/Clinic   I discussed the limitations of evaluation and management by telemedicine and the availability of in person appointments. The patient expressed understanding and agreed to proceed.    History of Present Illness: Taylor [name] is a 32 y.o. who identifies as a female who was assigned female at birth, and is being seen today for anxiety. She reports she went to the ED on 09/09/22 and diagnosed with osteomyelitis of right ankle and will need surgery. This is giving her a lot of anxiety and worried about losing her foot.   She is on the way to see her Ortho Surgeon now and scheduled for surgery tomorrow.   She is followed by [facility] for GAD, Depression, and Bipolar. She is currently taking Wellbutrin 300 mg, Cymbalta 120 mg, and Vraylar 1.5 mg.  Taking Ambien 6.25 mg as needed for insomnia.   HPI: Anxiety Presents for follow-up visit. Symptoms include excessive worry, hyperventilation, nervous/anxious behavior, palpitations, panic and restlessness. Symptoms occur most days. The severity of symptoms is moderate.      Problems:  Patient Active Problem List   Diagnosis Date Noted   Charcot ankle, 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   Osteomyelitis of right ankle (HCC) 09/12/2022   Acute osteomyelitis of right foot (HCC) 09/09/2022   HTN (hypertension) 09/09/2022   H/O ankle fusion 03/11/2022   Prolapsed internal hemorrhoids, grade 2 09/16/2021   Displaced pilon fracture of right tibia, initial encounter for closed fracture    Lymphocytic colitis 05/16/2021  Bloating 11/28/2020   Incontinence of feces with fecal urgency 11/15/2020   Sprain of anterior talofibular ligament of right ankle    Impingement of right ankle joint    PTSD (post-traumatic stress disorder) 10/04/2020   Migraine without aura and without status migrainosus, not intractable 10/04/2020   Hemorrhoids 01/30/2020   Leukocytosis 09/20/2019   Pain in  left foot 09/14/2019   Chronic depression 08/04/2019   Chronic back pain 08/04/2019   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HCC) 08/04/2019   Lumbar radiculopathy 08/04/2019   IUD (intrauterine device) in place 04/27/2019   Morbid obesity (HCC) 01/23/2019   H/O Abnormal LFTs 10/17/2018   Bicuspid aortic valve    GAD (generalized anxiety disorder) 06/30/2018   GERD (gastroesophageal reflux disease) 06/06/2018   Current smoker 06/06/2018   Depression, major, single episode, moderate (HCC) 06/06/2018    Allergies:  Allergies  Allergen Reactions   Other Itching    Animal Dander   Medications:  Current Outpatient Medications:    acetaminophen (TYLENOL) 650 MG CR tablet, Take 1,300 mg by mouth every 8 (eight) hours as needed for pain., Disp: , Rfl:    buPROPion (WELLBUTRIN XL) 300 MG 24 hr tablet, Take 300 mg by mouth every morning., Disp: , Rfl:    doxycycline (VIBRA-TABS) 100 MG tablet, Take 1 tablet (100 mg total) by mouth 2 (two) times daily for 7 days., Disp: 14 tablet, Rfl: 0   doxylamine, Sleep, (UNISOM) 25 MG tablet, Take 25 mg by mouth at bedtime as needed for sleep., Disp: , Rfl:    DULoxetine (CYMBALTA) 30 MG capsule, Take 30 mg by mouth in the morning. Take with the 60 mg to equal 90 mg, Disp: , Rfl:    DULoxetine (CYMBALTA) 60 MG capsule, Take 60 mg by mouth daily. Take with 30 mg to equal 90 mg, Disp: , Rfl:    lisinopril (ZESTRIL) 40 MG tablet, Take 1 tablet (40 mg total) by mouth daily., Disp: 90 tablet, Rfl: 1   mupirocin ointment (BACTROBAN) 2 %, Apply 1 Application topically 2 (two) times daily., Disp: 60 g, Rfl: 1   NUVIGIL 200 MG TABS, Take 1 tablet by mouth every morning., Disp: , Rfl:    oxyCODONE (OXY IR/ROXICODONE) 5 MG immediate release tablet, Take 1 tablet (5 mg total) by mouth every 6 (six) hours for 5 days., Disp: 20 tablet, Rfl: 0   pantoprazole (PROTONIX) 40 MG tablet, Take 1 tablet (40 mg total) by mouth 2 (two) times daily., Disp: 60 tablet, Rfl: 3   VRAYLAR  1.5 MG capsule, Take 1.5 mg by mouth daily., Disp: , Rfl:    zolpidem (AMBIEN CR) 6.25 MG CR tablet, Take 6.25 mg by mouth at bedtime., Disp: , Rfl:   Observations/Objective: Patient is well-developed, well-nourished in no acute distress.  Head is normocephalic, atraumatic.  No labored breathing.  Speech is clear and coherent with logical content.  Patient is alert and oriented at baseline.  Pt crying and anxious  Assessment and Plan: 1. GAD (generalized anxiety disorder)  2. Depression, major, single episode, moderate (HCC)  3. Panic attack  4. Acute osteomyelitis of right foot (HCC)  PT on her way to talk to Ortho surgeon now and surgery scheduled for tomorrow Given surgery is tomorrow we can not give ativan. Pt requesting ativan as this helped in the hospital.  Continue Cymbalta, Wellbutrin, and Vraylar Stress management  Keep chronic follow up   Follow Up Instructions: I discussed the assessment and treatment plan with the patient. The  patient was provided an opportunity to ask questions and all were answered. The patient agreed with the plan and demonstrated an understanding of the instructions.  A copy of instructions were sent to the patient via MyChart unless otherwise noted below.    The patient was advised to call back or seek an in-person evaluation if the symptoms worsen or if the condition fails to improve as anticipated.  Time:  I spent 16 minutes with the patient via telehealth technology discussing the above problems/concerns.    [name], FNP
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
## 2022-09-15 NOTE — Progress Notes (Signed)
Spoke with pt for pre-op call. Pt denies cardiac history. Pt is treated for HTN.   Shower instructions given to pt.

## 2022-09-16 ENCOUNTER — Other Ambulatory Visit: Payer: Self-pay

## 2022-09-16 ENCOUNTER — Inpatient Hospital Stay (HOSPITAL_COMMUNITY)
Admission: RE | Admit: 2022-09-16 | Discharge: 2022-09-18 | DRG: 464 | Disposition: A | Discharge status: Rehabilitation Facility | Payer: Medicare Other | Attending: Orthopedic Surgery | Admitting: Orthopedic Surgery

## 2022-09-16 ENCOUNTER — Inpatient Hospital Stay (HOSPITAL_COMMUNITY): Payer: Medicare Other | Admitting: Certified Registered"

## 2022-09-16 ENCOUNTER — Encounter (HOSPITAL_COMMUNITY)
Admission: RE | Disposition: A | Discharge status: Rehabilitation Facility | Payer: Self-pay | Source: Home / Self Care | Attending: Orthopedic Surgery

## 2022-09-16 ENCOUNTER — Encounter (HOSPITAL_COMMUNITY): Payer: Self-pay | Admitting: Orthopedic Surgery

## 2022-09-16 DIAGNOSIS — M797 Fibromyalgia: Secondary | ICD-10-CM | POA: Diagnosis present

## 2022-09-16 DIAGNOSIS — Z833 Family history of diabetes mellitus: Secondary | ICD-10-CM

## 2022-09-16 DIAGNOSIS — Z01818 Encounter for other preprocedural examination: Principal | ICD-10-CM

## 2022-09-16 DIAGNOSIS — G4730 Sleep apnea, unspecified: Secondary | ICD-10-CM | POA: Diagnosis present

## 2022-09-16 DIAGNOSIS — Z8249 Family history of ischemic heart disease and other diseases of the circulatory system: Secondary | ICD-10-CM

## 2022-09-16 DIAGNOSIS — Z808 Family history of malignant neoplasm of other organs or systems: Secondary | ICD-10-CM

## 2022-09-16 DIAGNOSIS — L03115 Cellulitis of right lower limb: Secondary | ICD-10-CM | POA: Diagnosis present

## 2022-09-16 DIAGNOSIS — Z79899 Other long term (current) drug therapy: Secondary | ICD-10-CM | POA: Diagnosis not present

## 2022-09-16 DIAGNOSIS — Q231 Congenital insufficiency of aortic valve: Secondary | ICD-10-CM | POA: Diagnosis not present

## 2022-09-16 DIAGNOSIS — K5901 Slow transit constipation: Secondary | ICD-10-CM | POA: Diagnosis not present

## 2022-09-16 DIAGNOSIS — Z89511 Acquired absence of right leg below knee: Secondary | ICD-10-CM

## 2022-09-16 DIAGNOSIS — F4312 Post-traumatic stress disorder, chronic: Secondary | ICD-10-CM | POA: Diagnosis not present

## 2022-09-16 DIAGNOSIS — K219 Gastro-esophageal reflux disease without esophagitis: Secondary | ICD-10-CM | POA: Diagnosis present

## 2022-09-16 DIAGNOSIS — S88111S Complete traumatic amputation at level between knee and ankle, right lower leg, sequela: Secondary | ICD-10-CM | POA: Diagnosis not present

## 2022-09-16 DIAGNOSIS — M79604 Pain in right leg: Secondary | ICD-10-CM | POA: Diagnosis not present

## 2022-09-16 DIAGNOSIS — E876 Hypokalemia: Secondary | ICD-10-CM | POA: Diagnosis not present

## 2022-09-16 DIAGNOSIS — M14671 Charcot's joint, right ankle and foot: Secondary | ICD-10-CM | POA: Diagnosis present

## 2022-09-16 DIAGNOSIS — F319 Bipolar disorder, unspecified: Secondary | ICD-10-CM | POA: Diagnosis present

## 2022-09-16 DIAGNOSIS — M869 Osteomyelitis, unspecified: Secondary | ICD-10-CM | POA: Diagnosis not present

## 2022-09-16 DIAGNOSIS — Z6841 Body Mass Index (BMI) 40.0 and over, adult: Secondary | ICD-10-CM | POA: Diagnosis not present

## 2022-09-16 DIAGNOSIS — Z91048 Other nonmedicinal substance allergy status: Secondary | ICD-10-CM | POA: Diagnosis not present

## 2022-09-16 DIAGNOSIS — M009 Pyogenic arthritis, unspecified: Secondary | ICD-10-CM | POA: Diagnosis not present

## 2022-09-16 DIAGNOSIS — G5603 Carpal tunnel syndrome, bilateral upper limbs: Secondary | ICD-10-CM | POA: Diagnosis not present

## 2022-09-16 DIAGNOSIS — D72829 Elevated white blood cell count, unspecified: Secondary | ICD-10-CM | POA: Diagnosis not present

## 2022-09-16 DIAGNOSIS — Z825 Family history of asthma and other chronic lower respiratory diseases: Secondary | ICD-10-CM | POA: Diagnosis not present

## 2022-09-16 DIAGNOSIS — J45909 Unspecified asthma, uncomplicated: Secondary | ICD-10-CM | POA: Diagnosis not present

## 2022-09-16 DIAGNOSIS — Z8616 Personal history of COVID-19: Secondary | ICD-10-CM

## 2022-09-16 DIAGNOSIS — Z87891 Personal history of nicotine dependence: Secondary | ICD-10-CM

## 2022-09-16 DIAGNOSIS — K5900 Constipation, unspecified: Secondary | ICD-10-CM | POA: Diagnosis not present

## 2022-09-16 DIAGNOSIS — I10 Essential (primary) hypertension: Secondary | ICD-10-CM | POA: Diagnosis present

## 2022-09-16 DIAGNOSIS — F17290 Nicotine dependence, other tobacco product, uncomplicated: Secondary | ICD-10-CM | POA: Diagnosis not present

## 2022-09-16 DIAGNOSIS — G4700 Insomnia, unspecified: Secondary | ICD-10-CM | POA: Diagnosis not present

## 2022-09-16 DIAGNOSIS — M545 Low back pain, unspecified: Secondary | ICD-10-CM | POA: Diagnosis not present

## 2022-09-16 DIAGNOSIS — F4001 Agoraphobia with panic disorder: Secondary | ICD-10-CM | POA: Diagnosis not present

## 2022-09-16 DIAGNOSIS — F17211 Nicotine dependence, cigarettes, in remission: Secondary | ICD-10-CM | POA: Diagnosis not present

## 2022-09-16 DIAGNOSIS — G548 Other nerve root and plexus disorders: Secondary | ICD-10-CM | POA: Diagnosis not present

## 2022-09-16 DIAGNOSIS — R2689 Other abnormalities of gait and mobility: Secondary | ICD-10-CM | POA: Diagnosis not present

## 2022-09-16 HISTORY — DX: Essential (primary) hypertension: I10

## 2022-09-16 HISTORY — DX: Scoliosis, unspecified: M41.9

## 2022-09-16 HISTORY — PX: AMPUTATION: SHX166

## 2022-09-16 LAB — PREALBUMIN: Prealbumin: 28 mg/dL (ref 18–38)

## 2022-09-16 LAB — POCT PREGNANCY, URINE: Preg Test, Ur: NEGATIVE

## 2022-09-16 LAB — GLUCOSE, CAPILLARY
Glucose-Capillary: 95 mg/dL (ref 70–99)
Glucose-Capillary: 95 mg/dL (ref 70–99)

## 2022-09-16 SURGERY — AMPUTATION BELOW KNEE
Anesthesia: General | Site: Knee | Laterality: Right

## 2022-09-16 MED ORDER — DULOXETINE HCL 30 MG PO CPEP
30.0000 mg | ORAL_CAPSULE | Freq: Every day | ORAL | Status: DC
  Administered 2022-09-16 – 2022-09-18 (×3): 30 mg via ORAL
  Filled 2022-09-16 (×3): qty 1

## 2022-09-16 MED ORDER — CEFAZOLIN SODIUM-DEXTROSE 2-4 GM/100ML-% IV SOLN
2.0000 g | INTRAVENOUS | Status: AC
  Administered 2022-09-16: 2 g via INTRAVENOUS
  Filled 2022-09-16: qty 100

## 2022-09-16 MED ORDER — METHOCARBAMOL 500 MG PO TABS
500.0000 mg | ORAL_TABLET | Freq: Four times a day (QID) | ORAL | Status: DC | PRN
  Administered 2022-09-17 – 2022-09-18 (×4): 500 mg via ORAL
  Filled 2022-09-16 (×5): qty 1

## 2022-09-16 MED ORDER — BUPROPION HCL ER (XL) 150 MG PO TB24
300.0000 mg | ORAL_TABLET | Freq: Every morning | ORAL | Status: DC
  Administered 2022-09-17 – 2022-09-18 (×2): 300 mg via ORAL
  Filled 2022-09-16 (×2): qty 2

## 2022-09-16 MED ORDER — METOPROLOL TARTRATE 5 MG/5ML IV SOLN: 2.0000 mg | INTRAVENOUS | Status: DC | PRN

## 2022-09-16 MED ORDER — ZOLPIDEM TARTRATE 5 MG PO TABS: 5.0000 mg | ORAL_TABLET | Freq: Every evening | ORAL | Status: DC | PRN

## 2022-09-16 MED ORDER — MIDAZOLAM HCL 5 MG/5ML IJ SOLN
INTRAMUSCULAR | Status: DC | PRN
  Administered 2022-09-16: 2 mg via INTRAVENOUS

## 2022-09-16 MED ORDER — CEFAZOLIN SODIUM-DEXTROSE 2-4 GM/100ML-% IV SOLN
2.0000 g | Freq: Three times a day (TID) | INTRAVENOUS | Status: AC
  Administered 2022-09-16 – 2022-09-17 (×2): 2 g via INTRAVENOUS
  Filled 2022-09-16 (×2): qty 100

## 2022-09-16 MED ORDER — ACETAMINOPHEN 325 MG PO TABS: 325.0000 mg | ORAL_TABLET | ORAL | Status: DC | PRN

## 2022-09-16 MED ORDER — AMISULPRIDE (ANTIEMETIC) 5 MG/2ML IV SOLN: 10.0000 mg | Freq: Once | INTRAVENOUS | Status: DC | PRN

## 2022-09-16 MED ORDER — TRANEXAMIC ACID-NACL 1000-0.7 MG/100ML-% IV SOLN
1000.0000 mg | INTRAVENOUS | Status: DC
  Filled 2022-09-16: qty 100

## 2022-09-16 MED ORDER — CHLORHEXIDINE GLUCONATE 0.12 % MT SOLN: 15.0000 mL | Freq: Once | OROMUCOSAL | Status: AC

## 2022-09-16 MED ORDER — ACETAMINOPHEN 500 MG PO TABS
1000.0000 mg | ORAL_TABLET | Freq: Once | ORAL | Status: AC
  Administered 2022-09-16: 1000 mg via ORAL
  Filled 2022-09-16: qty 2

## 2022-09-16 MED ORDER — LABETALOL HCL 5 MG/ML IV SOLN: 10.0000 mg | INTRAVENOUS | Status: DC | PRN

## 2022-09-16 MED ORDER — HYDRALAZINE HCL 20 MG/ML IJ SOLN: 5.0000 mg | INTRAMUSCULAR | Status: DC | PRN

## 2022-09-16 MED ORDER — ARMODAFINIL 200 MG PO TABS: 1.0000 | ORAL_TABLET | Freq: Every morning | ORAL | Status: DC

## 2022-09-16 MED ORDER — SODIUM CHLORIDE 0.9 % IV SOLN: INTRAVENOUS | Status: DC

## 2022-09-16 MED ORDER — BISACODYL 5 MG PO TBEC: 5.0000 mg | DELAYED_RELEASE_TABLET | Freq: Every day | ORAL | Status: DC | PRN

## 2022-09-16 MED ORDER — GUAIFENESIN-DM 100-10 MG/5ML PO SYRP: 15.0000 mL | ORAL_SOLUTION | ORAL | Status: DC | PRN

## 2022-09-16 MED ORDER — LISINOPRIL 20 MG PO TABS
40.0000 mg | ORAL_TABLET | Freq: Every day | ORAL | Status: DC
  Administered 2022-09-17 – 2022-09-18 (×2): 40 mg via ORAL
  Filled 2022-09-16 (×2): qty 2

## 2022-09-16 MED ORDER — 0.9 % SODIUM CHLORIDE (POUR BTL) OPTIME
TOPICAL | Status: DC | PRN
  Administered 2022-09-16: 1000 mL

## 2022-09-16 MED ORDER — ALUM & MAG HYDROXIDE-SIMETH 200-200-20 MG/5ML PO SUSP: 15.0000 mL | ORAL | Status: DC | PRN

## 2022-09-16 MED ORDER — OXYCODONE HCL 5 MG/5ML PO SOLN: 5.0000 mg | Freq: Once | ORAL | Status: DC | PRN

## 2022-09-16 MED ORDER — ASPIRIN 325 MG PO TBEC
325.0000 mg | DELAYED_RELEASE_TABLET | Freq: Every day | ORAL | Status: DC
  Administered 2022-09-17 – 2022-09-18 (×2): 325 mg via ORAL
  Filled 2022-09-16 (×2): qty 1

## 2022-09-16 MED ORDER — BUPIVACAINE LIPOSOME 1.3 % IJ SUSP
INTRAMUSCULAR | Status: DC | PRN
  Administered 2022-09-16: 10 mL via PERINEURAL

## 2022-09-16 MED ORDER — ACETAMINOPHEN 325 MG PO TABS
325.0000 mg | ORAL_TABLET | Freq: Four times a day (QID) | ORAL | Status: DC | PRN
  Administered 2022-09-17: 650 mg via ORAL
  Filled 2022-09-16: qty 2

## 2022-09-16 MED ORDER — OXYCODONE HCL 5 MG PO TABS
10.0000 mg | ORAL_TABLET | ORAL | Status: DC | PRN
  Administered 2022-09-16 – 2022-09-18 (×6): 15 mg via ORAL
  Filled 2022-09-16 (×6): qty 3

## 2022-09-16 MED ORDER — ORAL CARE MOUTH RINSE: 15.0000 mL | Freq: Once | OROMUCOSAL | Status: AC

## 2022-09-16 MED ORDER — FENTANYL CITRATE (PF) 100 MCG/2ML IJ SOLN
INTRAMUSCULAR | Status: AC
  Filled 2022-09-16: qty 2

## 2022-09-16 MED ORDER — DOCUSATE SODIUM 100 MG PO CAPS
100.0000 mg | ORAL_CAPSULE | Freq: Every day | ORAL | Status: DC
  Administered 2022-09-17 – 2022-09-18 (×2): 100 mg via ORAL
  Filled 2022-09-16 (×2): qty 1

## 2022-09-16 MED ORDER — PANTOPRAZOLE SODIUM 40 MG PO TBEC
40.0000 mg | DELAYED_RELEASE_TABLET | Freq: Every day | ORAL | Status: DC
Start: 2022-09-16 — End: 2022-09-16

## 2022-09-16 MED ORDER — FENTANYL CITRATE (PF) 100 MCG/2ML IJ SOLN: 25.0000 ug | INTRAMUSCULAR | Status: DC | PRN

## 2022-09-16 MED ORDER — JUVEN PO PACK
1.0000 | PACK | Freq: Two times a day (BID) | ORAL | Status: DC
  Administered 2022-09-17 – 2022-09-18 (×4): 1 via ORAL
  Filled 2022-09-16 (×4): qty 1

## 2022-09-16 MED ORDER — ONDANSETRON HCL 4 MG/2ML IJ SOLN
INTRAMUSCULAR | Status: DC | PRN
  Administered 2022-09-16: 4 mg via INTRAVENOUS

## 2022-09-16 MED ORDER — MODAFINIL 100 MG PO TABS
100.0000 mg | ORAL_TABLET | Freq: Every day | ORAL | Status: DC
  Administered 2022-09-17 – 2022-09-18 (×2): 100 mg via ORAL
  Filled 2022-09-16 (×2): qty 1

## 2022-09-16 MED ORDER — FENTANYL CITRATE (PF) 250 MCG/5ML IJ SOLN
INTRAMUSCULAR | Status: AC
  Filled 2022-09-16: qty 5

## 2022-09-16 MED ORDER — LACTATED RINGERS IV SOLN: INTRAVENOUS | Status: DC

## 2022-09-16 MED ORDER — ORAL CARE MOUTH RINSE: 15.0000 mL | OROMUCOSAL | Status: DC | PRN

## 2022-09-16 MED ORDER — PROMETHAZINE HCL 25 MG/ML IJ SOLN: 6.2500 mg | INTRAMUSCULAR | Status: DC | PRN

## 2022-09-16 MED ORDER — CARIPRAZINE HCL 1.5 MG PO CAPS
1.5000 mg | ORAL_CAPSULE | Freq: Every day | ORAL | Status: DC
  Administered 2022-09-16 – 2022-09-18 (×3): 1.5 mg via ORAL
  Filled 2022-09-16 (×3): qty 1

## 2022-09-16 MED ORDER — MAGNESIUM CITRATE PO SOLN: 1.0000 | Freq: Once | ORAL | Status: DC | PRN

## 2022-09-16 MED ORDER — POLYETHYLENE GLYCOL 3350 17 G PO PACK: 17.0000 g | PACK | Freq: Every day | ORAL | Status: DC | PRN

## 2022-09-16 MED ORDER — INSULIN ASPART 100 UNIT/ML IJ SOLN: 0.0000 [IU] | INTRAMUSCULAR | Status: DC | PRN

## 2022-09-16 MED ORDER — PANTOPRAZOLE SODIUM 40 MG PO TBEC
40.0000 mg | DELAYED_RELEASE_TABLET | Freq: Two times a day (BID) | ORAL | Status: DC
  Administered 2022-09-16 – 2022-09-18 (×4): 40 mg via ORAL
  Filled 2022-09-16 (×4): qty 1

## 2022-09-16 MED ORDER — FENTANYL CITRATE (PF) 100 MCG/2ML IJ SOLN
INTRAMUSCULAR | Status: DC | PRN
  Administered 2022-09-16: 50 ug via INTRAVENOUS
  Administered 2022-09-16 (×6): 25 ug via INTRAVENOUS

## 2022-09-16 MED ORDER — DULOXETINE HCL 60 MG PO CPEP
60.0000 mg | ORAL_CAPSULE | Freq: Every day | ORAL | Status: DC
  Administered 2022-09-16 – 2022-09-18 (×3): 60 mg via ORAL
  Filled 2022-09-16 (×3): qty 1

## 2022-09-16 MED ORDER — ONDANSETRON HCL 4 MG/2ML IJ SOLN: 4.0000 mg | Freq: Four times a day (QID) | INTRAMUSCULAR | Status: DC | PRN

## 2022-09-16 MED ORDER — ACETAMINOPHEN 10 MG/ML IV SOLN: 1000.0000 mg | Freq: Once | INTRAVENOUS | Status: DC | PRN

## 2022-09-16 MED ORDER — POTASSIUM CHLORIDE CRYS ER 20 MEQ PO TBCR: 20.0000 meq | EXTENDED_RELEASE_TABLET | Freq: Every day | ORAL | Status: DC | PRN

## 2022-09-16 MED ORDER — OXYCODONE HCL 5 MG PO TABS: 5.0000 mg | ORAL_TABLET | Freq: Once | ORAL | Status: DC | PRN

## 2022-09-16 MED ORDER — CHLORHEXIDINE GLUCONATE 0.12 % MT SOLN
OROMUCOSAL | Status: AC
  Administered 2022-09-16: 15 mL via OROMUCOSAL
  Filled 2022-09-16: qty 15

## 2022-09-16 MED ORDER — BUPIVACAINE HCL (PF) 0.5 % IJ SOLN
INTRAMUSCULAR | Status: DC | PRN
  Administered 2022-09-16: 20 mL via PERINEURAL

## 2022-09-16 MED ORDER — VITAMIN C 500 MG PO TABS
1000.0000 mg | ORAL_TABLET | Freq: Every day | ORAL | Status: DC
  Administered 2022-09-16 – 2022-09-18 (×3): 1000 mg via ORAL
  Filled 2022-09-16 (×3): qty 2

## 2022-09-16 MED ORDER — OXYCODONE HCL 5 MG PO TABS
5.0000 mg | ORAL_TABLET | ORAL | Status: DC | PRN
  Administered 2022-09-18 (×2): 10 mg via ORAL
  Filled 2022-09-16 (×2): qty 2

## 2022-09-16 MED ORDER — MIDAZOLAM HCL 2 MG/2ML IJ SOLN
INTRAMUSCULAR | Status: AC
  Filled 2022-09-16: qty 2

## 2022-09-16 MED ORDER — PROPOFOL 10 MG/ML IV BOLUS
INTRAVENOUS | Status: DC | PRN
  Administered 2022-09-16: 50 mg via INTRAVENOUS
  Administered 2022-09-16: 20 mg via INTRAVENOUS
  Administered 2022-09-16: 10 mg via INTRAVENOUS
  Administered 2022-09-16: 20 mg via INTRAVENOUS
  Administered 2022-09-16: 200 mg via INTRAVENOUS
  Administered 2022-09-16: 10 mg via INTRAVENOUS
  Administered 2022-09-16: 50 mg via INTRAVENOUS

## 2022-09-16 MED ORDER — DEXMEDETOMIDINE HCL IN NACL 80 MCG/20ML IV SOLN
INTRAVENOUS | Status: DC | PRN
  Administered 2022-09-16 (×4): 4 ug via INTRAVENOUS

## 2022-09-16 MED ORDER — PHENOL 1.4 % MT LIQD: 1.0000 | OROMUCOSAL | Status: DC | PRN

## 2022-09-16 MED ORDER — TRANEXAMIC ACID 1000 MG/10ML IV SOLN
2000.0000 mg | INTRAVENOUS | Status: DC
  Filled 2022-09-16: qty 20

## 2022-09-16 MED ORDER — ZINC SULFATE 220 (50 ZN) MG PO CAPS
220.0000 mg | ORAL_CAPSULE | Freq: Every day | ORAL | Status: DC
  Administered 2022-09-16 – 2022-09-18 (×3): 220 mg via ORAL
  Filled 2022-09-16 (×3): qty 1

## 2022-09-16 MED ORDER — GABAPENTIN 300 MG PO CAPS
300.0000 mg | ORAL_CAPSULE | Freq: Three times a day (TID) | ORAL | Status: DC
  Administered 2022-09-16 – 2022-09-18 (×6): 300 mg via ORAL
  Filled 2022-09-16 (×6): qty 1

## 2022-09-16 MED ORDER — ACETAMINOPHEN 160 MG/5ML PO SOLN: 325.0000 mg | ORAL | Status: DC | PRN

## 2022-09-16 MED ORDER — MAGNESIUM SULFATE 2 GM/50ML IV SOLN: 2.0000 g | Freq: Every day | INTRAVENOUS | Status: DC | PRN

## 2022-09-16 MED ORDER — HYDROMORPHONE HCL 1 MG/ML IJ SOLN
0.5000 mg | INTRAMUSCULAR | Status: DC | PRN
  Administered 2022-09-16 – 2022-09-18 (×5): 1 mg via INTRAVENOUS
  Filled 2022-09-16 (×5): qty 1

## 2022-09-16 SURGICAL SUPPLY — 44 items
BAG COUNTER SPONGE SURGICOUNT (BAG) IMPLANT
BAG SPNG CNTER NS LX DISP (BAG)
BIT DRILL 3.2XOCPTL (BIT) ×1 IMPLANT
BIT DRL 3.2XOCPTL (BIT) ×1
BLADE SAW RECIP 87.9 MT (BLADE) ×1 IMPLANT
BLADE SURG 21 STRL SS (BLADE) ×1 IMPLANT
BNDG CMPR 5X6 CHSV STRCH STRL (GAUZE/BANDAGES/DRESSINGS)
BNDG COHESIVE 6X5 TAN ST LF (GAUZE/BANDAGES/DRESSINGS) IMPLANT
CANISTER WOUND CARE 500ML ATS (WOUND CARE) ×1 IMPLANT
COVER SURGICAL LIGHT HANDLE (MISCELLANEOUS) ×1 IMPLANT
CUFF TOURN SGL QUICK 34 (TOURNIQUET CUFF) ×1
CUFF TRNQT CYL 34X4.125X (TOURNIQUET CUFF) ×1 IMPLANT
DRAPE DERMATAC (DRAPES) IMPLANT
DRAPE INCISE IOBAN 66X45 STRL (DRAPES) ×1 IMPLANT
DRAPE U-SHAPE 47X51 STRL (DRAPES) ×1 IMPLANT
DRESSING PREVENA PLUS CUSTOM (GAUZE/BANDAGES/DRESSINGS) ×1 IMPLANT
DRILL BIT (BIT) ×1
DRSG PREVENA PLUS CUSTOM (GAUZE/BANDAGES/DRESSINGS) ×1
DURAPREP 26ML APPLICATOR (WOUND CARE) ×1 IMPLANT
ELECT REM PT RETURN 9FT ADLT (ELECTROSURGICAL) ×1
ELECTRODE REM PT RTRN 9FT ADLT (ELECTROSURGICAL) ×1 IMPLANT
GLOVE BIOGEL PI IND STRL 9 (GLOVE) ×1 IMPLANT
GLOVE SURG ORTHO 9.0 STRL STRW (GLOVE) ×1 IMPLANT
GOWN STRL REUS W/ TWL XL LVL3 (GOWN DISPOSABLE) ×2 IMPLANT
GOWN STRL REUS W/TWL XL LVL3 (GOWN DISPOSABLE) ×2
GRAFT SKIN WND MICRO 38 (Tissue) IMPLANT
GRAFT SKIN WND OMEGA3 SB 7X10 (Tissue) IMPLANT
KIT BASIN OR (CUSTOM PROCEDURE TRAY) ×1 IMPLANT
KIT TURNOVER KIT B (KITS) ×1 IMPLANT
MANIFOLD NEPTUNE II (INSTRUMENTS) ×1 IMPLANT
NS IRRIG 1000ML POUR BTL (IV SOLUTION) ×1 IMPLANT
PACK ORTHO EXTREMITY (CUSTOM PROCEDURE TRAY) ×1 IMPLANT
PAD ARMBOARD 7.5X6 YLW CONV (MISCELLANEOUS) ×1 IMPLANT
PREVENA RESTOR ARTHOFORM 46X30 (CANNISTER) ×1 IMPLANT
SPONGE T-LAP 18X18 ~~LOC~~+RFID (SPONGE) IMPLANT
STAPLER VISISTAT 35W (STAPLE) IMPLANT
STOCKINETTE IMPERVIOUS LG (DRAPES) ×1 IMPLANT
SUT ETHILON 2 0 PSLX (SUTURE) IMPLANT
SUT SILK 2 0 (SUTURE) ×1
SUT SILK 2-0 18XBRD TIE 12 (SUTURE) ×1 IMPLANT
SUT VIC AB 1 CTX 27 (SUTURE) ×2 IMPLANT
TOWEL GREEN STERILE (TOWEL DISPOSABLE) ×1 IMPLANT
TUBE CONNECTING 12X1/4 (SUCTIONS) ×1 IMPLANT
YANKAUER SUCT BULB TIP NO VENT (SUCTIONS) ×1 IMPLANT

## 2022-09-16 NOTE — Progress Notes (Signed)
PT Cancellation Note  Patient Details Name: Taylor [name] MRN: [id number] DOB: 09-Jan-1991   Cancelled Treatment:    Reason Eval/Treat Not Completed: Other (comment).  New return from surgery, reattempt tomorrow.   Ivar Drape 09/16/2022, 3:02 PM  Samul Dada, PT PhD Acute Rehab Dept. Number: [facility] [id number] and [facility] [phone]

## 2022-09-16 NOTE — Interval H&P Note (Signed)
History and Physical Interval Note:  09/16/2022 12:18 PM  Taylor [name]  has presented today for surgery, with the diagnosis of Osteomyelitis vs. Charcot right ankle.  The various methods of treatment have been discussed with the patient and family. After consideration of risks, benefits and other options for treatment, the patient has consented to  Procedure(s): RIGHT BELOW KNEE AMPUTATION (Right) as a surgical intervention.  The patient's history has been reviewed, patient examined, no change in status, stable for surgery.  I have reviewed the patient's chart and labs.  Questions were answered to the patient's satisfaction.     Nadara Mustard

## 2022-09-16 NOTE — Transfer of Care (Signed)
Immediate Anesthesia Transfer of Care Note  Patient: Taylor [name]  Procedure(s) Performed: RIGHT BELOW KNEE AMPUTATION (Right: Knee)  Patient Location: PACU  Anesthesia Type:GA combined with regional for post-op pain  Level of Consciousness: drowsy  Airway & Oxygen Therapy: Patient Spontanous Breathing and Patient connected to face mask oxygen  Post-op Assessment: Report given to RN and Post -op Vital signs reviewed and stable  Post vital signs: Reviewed and stable  Last Vitals:  Vitals Value Taken Time  BP 139/82 09/16/22 1346  Temp    Pulse 106 09/16/22 1349  Resp 20 09/16/22 1349  SpO2 99 % 09/16/22 1349  Vitals shown include unvalidated device data.  Last Pain:  Vitals:   09/16/22 1148  TempSrc:   PainSc: 7       Patients Stated Pain Goal: 2 (09/16/22 1148)  Complications: No notable events documented.

## 2022-09-16 NOTE — Anesthesia Preprocedure Evaluation (Addendum)
Anesthesia Evaluation  Patient identified by MRN, date of birth, ID band Patient awake    Reviewed: Allergy & Precautions, NPO status , Patient's Chart, lab work & pertinent test results  Airway Mallampati: IV  TM Distance: >3 FB Neck ROM: Full    Dental  (+) Poor Dentition, Missing, Chipped   Pulmonary asthma , sleep apnea , former smoker    + decreased breath sounds      Cardiovascular hypertension, Pt. on medications (-) dysrhythmias  Rhythm:Regular Rate:Normal     Neuro/Psych  Headaches PSYCHIATRIC DISORDERS Anxiety Depression Bipolar Disorder    Neuromuscular disease    GI/Hepatic Neg liver ROS,GERD  Medicated,,  Endo/Other  negative endocrine ROS    Renal/GU negative Renal ROS     Musculoskeletal  (+) Arthritis ,  Fibromyalgia -  Abdominal   Peds  Hematology   Anesthesia Other Findings   Reproductive/Obstetrics                             Anesthesia Physical Anesthesia Plan  ASA: 3  Anesthesia Plan: General   Post-op Pain Management: Tylenol PO (pre-op)* and Regional block*   Induction: Intravenous  PONV Risk Score and Plan: 3 and Ondansetron, Dexamethasone, Propofol infusion and Midazolam  Airway Management Planned: LMA  Additional Equipment: None  Intra-op Plan:   Post-operative Plan: Extubation in OR  Informed Consent: I have reviewed the patients History and Physical, chart, labs and discussed the procedure including the risks, benefits and alternatives for the proposed anesthesia with the patient or authorized representative who has indicated his/her understanding and acceptance.       Plan Discussed with: CRNA  Anesthesia Plan Comments:        Anesthesia Quick Evaluation

## 2022-09-16 NOTE — Anesthesia Procedure Notes (Signed)
Procedure Name: LMA Insertion Date/Time: 09/16/2022 1:08 PM  Performed by: Marny Lowenstein, CRNAPre-anesthesia Checklist: Patient identified, Emergency Drugs available, Suction available and Patient being monitored Patient Re-evaluated:Patient Re-evaluated prior to induction Oxygen Delivery Method: Circle system utilized Preoxygenation: Pre-oxygenation with 100% oxygen Induction Type: IV induction Ventilation: Mask ventilation without difficulty LMA: LMA inserted LMA Size: 4.0 Number of attempts: 1 Placement Confirmation: positive ETCO2 and breath sounds checked- equal and bilateral Tube secured with: Tape Dental Injury: Teeth and Oropharynx as per pre-operative assessment

## 2022-09-16 NOTE — Anesthesia Procedure Notes (Signed)
Anesthesia Regional Block: Popliteal block   Pre-Anesthetic Checklist: , timeout performed,  Correct Patient, Correct Site, Correct Laterality,  Correct Procedure, Correct Position, site marked,  Risks and benefits discussed,  Surgical consent,  Pre-op evaluation,  At surgeon's request and post-op pain management  Laterality: Right  Prep: chloraprep       Needles:  Injection technique: Single-shot  Needle Type: Echogenic Stimulator Needle     Needle Length: 9cm  Needle Gauge: 21     Additional Needles:   Procedures:,,,, ultrasound used (permanent image in chart),,    Narrative:  Start time: 09/16/2022 12:45 PM End time: 09/16/2022 12:50 PM Injection made incrementally with aspirations every 5 mL.  Performed by: Personally  Anesthesiologist: Shelton Silvas, MD  Additional Notes: Discussed risks and benefits of the nerve block in detail, including but not limited vascular injury, permanent nerve damage and infection.   Patient tolerated the procedure well. Local anesthetic introduced in an incremental fashion under minimal resistance after negative aspirations. No paresthesias were elicited. After completion of the procedure, no acute issues were identified and patient continued to be monitored by RN.

## 2022-09-16 NOTE — Op Note (Signed)
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 Application of Prevena customizable and Prevena arthroform wound VAC dressings Application of Vive Wear stump shrinker and the [employer] limb protector  SURGEON:  Nadara Mustard, MD  ANESTHESIA:   General  PREOPERATIVE INDICATIONS:  Taylor [name] is a  32 y.o. 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    The risks benefits and alternatives were discussed with the patient preoperatively including but not limited to the risks of infection, bleeding, nerve injury, cardiopulmonary complications, the need for revision surgery, among others, and the patient was willing to proceed.  OPERATIVE IMPLANTS:   Implant Name Type Inv. Item Serial No. Manufacturer Lot No. LRB No. Used Action  GRAFT SKIN WND MICRO 38 - [id number] Tissue GRAFT SKIN WND MICRO 38  [employer] [id number] Right 1 Implanted  GRAFT SKIN WND OMEGA3 SB 7X10 - [id number] Tissue GRAFT SKIN WND OMEGA3 SB 7X10  [employer] [id number] Right 1 Implanted     OPERATIVE FINDINGS: Tissue margins were clear there was significant amount of fluid from venous and lymphatic insufficiency.  No signs of infection.  OPERATIVE PROCEDURE: Patient was brought to the operating room after undergoing a regional anesthetic.  After adequate levels anesthesia were obtained a thigh tourniquet was placed and the lower extremity was prepped using DuraPrep draped into a sterile field. The foot was draped out of the sterile field with impervious stockinette.  A timeout was called and the tourniquet inflated.  A transverse skin incision was made 12 cm distal to the tibial tubercle, the incision curved  proximally, and a large posterior flap was created.  The tibia was transected just proximal to the skin incision and beveled anteriorly.  The fibula was transected just proximal to the tibial incision.  The sciatic nerve was pulled cut and allowed to retract.  The vascular bundles were suture ligated with 2-0 silk.  The tourniquet was deflated and hemostasis obtained.    Drill holes were placed through the tibia and fibula to secure the [facility] tissue graft and the gastrocnemius fascia.    The Kerecis micro powder 38 cm was applied to the open wound that has a 200 cm surface area.  The 7 x 10 cm Kerecis sheet was then folded and secured to the distal tibia and fibula with #1 Vicryl.  A separate drill hole was then used to secure the [facility] tissue graft and the gastrocnemius fascia to the dorsum of the tibia.    The deep and superficial fascial layers were closed using #1 Vicryl.  The skin was closed using staples.    The Prevena customizable dressing was applied this was overwrapped with the arthroform sponge.  Collier Flowers was used to secure the sponges and the circumferential compression was secured to the skin with Dermatac.  This was connected to the wound VAC pump and had a good suction fit this was covered with a stump shrinker and a limb protector.  Patient was taken to the PACU in stable condition.   DISCHARGE PLANNING:  Antibiotic duration: 24-hour antibiotics  Weightbearing: Nonweightbearing on the operative extremity  Pain medication: Opioid pathway  Dressing care/ Wound VAC: Continue wound VAC with the Prevena plus pump at  discharge for 1 week  Ambulatory devices: Walker or kneeling scooter  Discharge to: Discharge planning based on recommendations per physical therapy  Follow-up: In the office 1 week after discharge.

## 2022-09-17 ENCOUNTER — Encounter (HOSPITAL_COMMUNITY): Payer: Self-pay | Admitting: Orthopedic Surgery

## 2022-09-17 LAB — BASIC METABOLIC PANEL
Anion gap: 7 (ref 5–15)
BUN: 10 mg/dL (ref 6–20)
CO2: 26 mmol/L (ref 22–32)
Calcium: 8.5 mg/dL — ABNORMAL LOW (ref 8.9–10.3)
Chloride: 104 mmol/L (ref 98–111)
Creatinine, Ser: 0.72 mg/dL (ref 0.44–1.00)
GFR, Estimated: >60 mL/min (ref >60)
Glucose, Bld: 107 mg/dL — ABNORMAL HIGH (ref 70–99)
Potassium: 3.4 mmol/L — ABNORMAL LOW (ref 3.5–5.1)
Sodium: 137 mmol/L (ref 135–145)

## 2022-09-17 LAB — CBC
HCT: 32.9 % — ABNORMAL LOW (ref 36.0–46.0)
Hemoglobin: 10.7 g/dL — ABNORMAL LOW (ref 12.0–15.0)
MCH: 31.6 pg (ref 26.0–34.0)
MCHC: 32.5 g/dL (ref 30.0–36.0)
MCV: 97.1 fL (ref 80.0–100.0)
Platelets: 253 10*3/uL (ref 150–400)
RBC: 3.39 MIL/uL — ABNORMAL LOW (ref 3.87–5.11)
RDW: 13.9 % (ref 11.5–15.5)
WBC: 10 10*3/uL (ref 4.0–10.5)
nRBC: 0 % (ref 0.0–0.2)

## 2022-09-17 NOTE — Progress Notes (Signed)
Patient ID: Taylor [name], female   DOB: [birth year], 32 y.o.   MRN: [id number] Patient is postoperative day 1 right transtibial amputation.  There is no drainage in the wound VAC canister there is a good suction fit.  The dermatitis in the left leg has improved.  Patient states she would like to discharge to home with home therapy.  Will base discharge planning based on therapy recommendations.

## 2022-09-17 NOTE — Anesthesia Postprocedure Evaluation (Signed)
Anesthesia Post Note  Patient: Taylor [name]  Procedure(s) Performed: RIGHT BELOW KNEE AMPUTATION (Right: Knee)     Patient location during evaluation: PACU Anesthesia Type: Regional and General Level of consciousness: awake and alert Pain management: pain level controlled Vital Signs Assessment: post-procedure vital signs reviewed and stable Respiratory status: spontaneous breathing, nonlabored ventilation, respiratory function stable and patient connected to nasal cannula oxygen Cardiovascular status: blood pressure returned to baseline and stable Postop Assessment: no apparent nausea or vomiting Anesthetic complications: no   No notable events documented.  Last Vitals:  Vitals:   09/17/22 0715 09/17/22 1452  BP: 129/81 127/69  Pulse: (!) 101 (!) 104  Resp:    Temp: 37.2 C 36.4 C  SpO2: 94% 93%    Last Pain:  Vitals:   09/17/22 1452  TempSrc: Oral  PainSc:                  Shelton Silvas
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
## 2022-09-18 ENCOUNTER — Encounter (HOSPITAL_COMMUNITY): Payer: Self-pay | Admitting: Physical Medicine & Rehabilitation

## 2022-09-18 ENCOUNTER — Other Ambulatory Visit: Payer: Self-pay

## 2022-09-18 ENCOUNTER — Inpatient Hospital Stay (HOSPITAL_COMMUNITY)
Admission: RE | Admit: 2022-09-18 | Discharge: 2022-09-28 | DRG: 560 | Disposition: A | Discharge status: Home Health | Payer: Medicare Other | Source: Intra-hospital | Attending: Physical Medicine & Rehabilitation | Admitting: Physical Medicine & Rehabilitation

## 2022-09-18 DIAGNOSIS — R5383 Other fatigue: Secondary | ICD-10-CM | POA: Diagnosis present

## 2022-09-18 DIAGNOSIS — Z981 Arthrodesis status: Secondary | ICD-10-CM

## 2022-09-18 DIAGNOSIS — F411 Generalized anxiety disorder: Secondary | ICD-10-CM | POA: Diagnosis present

## 2022-09-18 DIAGNOSIS — R2689 Other abnormalities of gait and mobility: Secondary | ICD-10-CM

## 2022-09-18 DIAGNOSIS — F4001 Agoraphobia with panic disorder: Secondary | ICD-10-CM

## 2022-09-18 DIAGNOSIS — G4733 Obstructive sleep apnea (adult) (pediatric): Secondary | ICD-10-CM | POA: Diagnosis present

## 2022-09-18 DIAGNOSIS — Z89511 Acquired absence of right leg below knee: Secondary | ICD-10-CM

## 2022-09-18 DIAGNOSIS — F4312 Post-traumatic stress disorder, chronic: Secondary | ICD-10-CM | POA: Diagnosis present

## 2022-09-18 DIAGNOSIS — Z4781 Encounter for orthopedic aftercare following surgical amputation: Principal | ICD-10-CM

## 2022-09-18 DIAGNOSIS — D62 Acute posthemorrhagic anemia: Secondary | ICD-10-CM | POA: Diagnosis present

## 2022-09-18 DIAGNOSIS — Z6841 Body Mass Index (BMI) 40.0 and over, adult: Secondary | ICD-10-CM

## 2022-09-18 DIAGNOSIS — E876 Hypokalemia: Secondary | ICD-10-CM | POA: Diagnosis present

## 2022-09-18 DIAGNOSIS — G4700 Insomnia, unspecified: Secondary | ICD-10-CM | POA: Diagnosis not present

## 2022-09-18 DIAGNOSIS — M797 Fibromyalgia: Secondary | ICD-10-CM | POA: Diagnosis present

## 2022-09-18 DIAGNOSIS — Z79899 Other long term (current) drug therapy: Secondary | ICD-10-CM | POA: Diagnosis not present

## 2022-09-18 DIAGNOSIS — Z560 Unemployment, unspecified: Secondary | ICD-10-CM

## 2022-09-18 DIAGNOSIS — M79604 Pain in right leg: Secondary | ICD-10-CM | POA: Diagnosis not present

## 2022-09-18 DIAGNOSIS — G8929 Other chronic pain: Secondary | ICD-10-CM | POA: Diagnosis present

## 2022-09-18 DIAGNOSIS — D72829 Elevated white blood cell count, unspecified: Secondary | ICD-10-CM | POA: Diagnosis present

## 2022-09-18 DIAGNOSIS — G629 Polyneuropathy, unspecified: Secondary | ICD-10-CM | POA: Diagnosis present

## 2022-09-18 DIAGNOSIS — Z833 Family history of diabetes mellitus: Secondary | ICD-10-CM | POA: Diagnosis not present

## 2022-09-18 DIAGNOSIS — Z87442 Personal history of urinary calculi: Secondary | ICD-10-CM

## 2022-09-18 DIAGNOSIS — Z825 Family history of asthma and other chronic lower respiratory diseases: Secondary | ICD-10-CM | POA: Diagnosis not present

## 2022-09-18 DIAGNOSIS — F319 Bipolar disorder, unspecified: Secondary | ICD-10-CM

## 2022-09-18 DIAGNOSIS — I10 Essential (primary) hypertension: Secondary | ICD-10-CM | POA: Diagnosis present

## 2022-09-18 DIAGNOSIS — K5900 Constipation, unspecified: Secondary | ICD-10-CM | POA: Diagnosis present

## 2022-09-18 DIAGNOSIS — G546 Phantom limb syndrome with pain: Secondary | ICD-10-CM | POA: Insufficient documentation

## 2022-09-18 DIAGNOSIS — Z8616 Personal history of COVID-19: Secondary | ICD-10-CM

## 2022-09-18 DIAGNOSIS — Z8249 Family history of ischemic heart disease and other diseases of the circulatory system: Secondary | ICD-10-CM

## 2022-09-18 DIAGNOSIS — M419 Scoliosis, unspecified: Secondary | ICD-10-CM | POA: Diagnosis present

## 2022-09-18 DIAGNOSIS — F4310 Post-traumatic stress disorder, unspecified: Secondary | ICD-10-CM | POA: Diagnosis present

## 2022-09-18 DIAGNOSIS — M62838 Other muscle spasm: Secondary | ICD-10-CM | POA: Diagnosis present

## 2022-09-18 DIAGNOSIS — K5901 Slow transit constipation: Secondary | ICD-10-CM | POA: Diagnosis not present

## 2022-09-18 DIAGNOSIS — F17211 Nicotine dependence, cigarettes, in remission: Secondary | ICD-10-CM | POA: Diagnosis not present

## 2022-09-18 DIAGNOSIS — F17290 Nicotine dependence, other tobacco product, uncomplicated: Secondary | ICD-10-CM | POA: Diagnosis present

## 2022-09-18 DIAGNOSIS — G548 Other nerve root and plexus disorders: Secondary | ICD-10-CM | POA: Diagnosis not present

## 2022-09-18 DIAGNOSIS — Z8659 Personal history of other mental and behavioral disorders: Secondary | ICD-10-CM

## 2022-09-18 DIAGNOSIS — K219 Gastro-esophageal reflux disease without esophagitis: Secondary | ICD-10-CM | POA: Diagnosis present

## 2022-09-18 DIAGNOSIS — S88111S Complete traumatic amputation at level between knee and ankle, right lower leg, sequela: Secondary | ICD-10-CM | POA: Diagnosis not present

## 2022-09-18 DIAGNOSIS — G5603 Carpal tunnel syndrome, bilateral upper limbs: Secondary | ICD-10-CM | POA: Diagnosis present

## 2022-09-18 LAB — BASIC METABOLIC PANEL
Anion gap: 10 (ref 5–15)
BUN: 9 mg/dL (ref 6–20)
CO2: 27 mmol/L (ref 22–32)
Calcium: 8.8 mg/dL — ABNORMAL LOW (ref 8.9–10.3)
Chloride: 99 mmol/L (ref 98–111)
Creatinine, Ser: 0.54 mg/dL (ref 0.44–1.00)
GFR, Estimated: >60 mL/min (ref >60)
Glucose, Bld: 105 mg/dL — ABNORMAL HIGH (ref 70–99)
Potassium: 3 mmol/L — ABNORMAL LOW (ref 3.5–5.1)
Sodium: 136 mmol/L (ref 135–145)

## 2022-09-18 LAB — CBC
HCT: 29 % — ABNORMAL LOW (ref 36.0–46.0)
Hemoglobin: 9.8 g/dL — ABNORMAL LOW (ref 12.0–15.0)
MCH: 32.1 pg (ref 26.0–34.0)
MCHC: 33.8 g/dL (ref 30.0–36.0)
MCV: 95.1 fL (ref 80.0–100.0)
Platelets: 241 10*3/uL (ref 150–400)
RBC: 3.05 MIL/uL — ABNORMAL LOW (ref 3.87–5.11)
RDW: 14.1 % (ref 11.5–15.5)
WBC: 11.1 10*3/uL — ABNORMAL HIGH (ref 4.0–10.5)
nRBC: 0 % (ref 0.0–0.2)

## 2022-09-18 LAB — GLUCOSE, CAPILLARY: Glucose-Capillary: 113 mg/dL — ABNORMAL HIGH (ref 70–99)

## 2022-09-18 MED ORDER — MODAFINIL 100 MG PO TABS
100.0000 mg | ORAL_TABLET | Freq: Every day | ORAL | Status: DC
  Administered 2022-09-19 – 2022-09-28 (×10): 100 mg via ORAL
  Filled 2022-09-18 (×10): qty 1

## 2022-09-18 MED ORDER — BISACODYL 10 MG RE SUPP: 10.0000 mg | Freq: Every day | RECTAL | Status: DC | PRN

## 2022-09-18 MED ORDER — VITAMIN C 500 MG PO TABS
1000.0000 mg | ORAL_TABLET | Freq: Every day | ORAL | Status: DC
  Administered 2022-09-19 – 2022-09-28 (×10): 1000 mg via ORAL
  Filled 2022-09-18 (×10): qty 2

## 2022-09-18 MED ORDER — ORAL CARE MOUTH RINSE: 15.0000 mL | OROMUCOSAL | Status: DC | PRN

## 2022-09-18 MED ORDER — ALUM & MAG HYDROXIDE-SIMETH 200-200-20 MG/5ML PO SUSP: 30.0000 mL | ORAL | Status: DC | PRN

## 2022-09-18 MED ORDER — POTASSIUM CHLORIDE CRYS ER 20 MEQ PO TBCR
30.0000 meq | EXTENDED_RELEASE_TABLET | Freq: Two times a day (BID) | ORAL | Status: AC
  Administered 2022-09-18 – 2022-09-21 (×6): 30 meq via ORAL
  Filled 2022-09-18 (×6): qty 1

## 2022-09-18 MED ORDER — CYCLOBENZAPRINE HCL 5 MG PO TABS
5.0000 mg | ORAL_TABLET | Freq: Three times a day (TID) | ORAL | Status: DC | PRN
  Administered 2022-09-22 – 2022-09-28 (×5): 5 mg via ORAL
  Filled 2022-09-18 (×4): qty 1

## 2022-09-18 MED ORDER — DIPHENHYDRAMINE HCL 25 MG PO CAPS: 25.0000 mg | ORAL_CAPSULE | Freq: Four times a day (QID) | ORAL | Status: DC | PRN

## 2022-09-18 MED ORDER — CYCLOBENZAPRINE HCL 5 MG PO TABS
5.0000 mg | ORAL_TABLET | Freq: Three times a day (TID) | ORAL | Status: DC
  Administered 2022-09-18 – 2022-09-28 (×30): 5 mg via ORAL
  Filled 2022-09-18 (×30): qty 1

## 2022-09-18 MED ORDER — GABAPENTIN 300 MG PO CAPS
300.0000 mg | ORAL_CAPSULE | Freq: Three times a day (TID) | ORAL | Status: DC
  Administered 2022-09-18 – 2022-09-23 (×14): 300 mg via ORAL
  Filled 2022-09-18 (×14): qty 1

## 2022-09-18 MED ORDER — BUPROPION HCL ER (XL) 300 MG PO TB24
300.0000 mg | ORAL_TABLET | Freq: Every day | ORAL | Status: DC
  Administered 2022-09-20 – 2022-09-28 (×9): 300 mg via ORAL
  Filled 2022-09-18 (×9): qty 1

## 2022-09-18 MED ORDER — OXYCODONE HCL 5 MG PO TABS
10.0000 mg | ORAL_TABLET | ORAL | Status: DC | PRN
  Administered 2022-09-18: 10 mg via ORAL
  Administered 2022-09-18: 5 mg via ORAL
  Administered 2022-09-18: 10 mg via ORAL
  Administered 2022-09-19 – 2022-09-26 (×33): 15 mg via ORAL
  Administered 2022-09-26: 10 mg via ORAL
  Administered 2022-09-26 – 2022-09-27 (×4): 15 mg via ORAL
  Administered 2022-09-27: 10 mg via ORAL
  Administered 2022-09-27: 15 mg via ORAL
  Filled 2022-09-18 (×6): qty 3
  Filled 2022-09-18: qty 2
  Filled 2022-09-18 (×8): qty 3
  Filled 2022-09-18: qty 2
  Filled 2022-09-18 (×7): qty 3
  Filled 2022-09-18: qty 2
  Filled 2022-09-18 (×2): qty 3
  Filled 2022-09-18: qty 2
  Filled 2022-09-18 (×17): qty 3

## 2022-09-18 MED ORDER — FLEET ENEMA 7-19 GM/118ML RE ENEM: 1.0000 | ENEMA | Freq: Once | RECTAL | Status: DC | PRN

## 2022-09-18 MED ORDER — DULOXETINE HCL 30 MG PO CPEP
30.0000 mg | ORAL_CAPSULE | Freq: Every day | ORAL | Status: DC
  Administered 2022-09-20 – 2022-09-28 (×9): 30 mg via ORAL
  Filled 2022-09-18 (×10): qty 1

## 2022-09-18 MED ORDER — ASPIRIN 325 MG PO TBEC
325.0000 mg | DELAYED_RELEASE_TABLET | Freq: Every day | ORAL | Status: DC
  Administered 2022-09-19 – 2022-09-21 (×3): 325 mg via ORAL
  Filled 2022-09-18 (×3): qty 1

## 2022-09-18 MED ORDER — LISINOPRIL 20 MG PO TABS
40.0000 mg | ORAL_TABLET | Freq: Every day | ORAL | Status: DC
  Administered 2022-09-19 – 2022-09-22 (×4): 40 mg via ORAL
  Filled 2022-09-18 (×4): qty 2

## 2022-09-18 MED ORDER — ZINC SULFATE 220 (50 ZN) MG PO CAPS
220.0000 mg | ORAL_CAPSULE | Freq: Every day | ORAL | Status: DC
  Administered 2022-09-19 – 2022-09-28 (×10): 220 mg via ORAL
  Filled 2022-09-18 (×10): qty 1

## 2022-09-18 MED ORDER — PROCHLORPERAZINE EDISYLATE 10 MG/2ML IJ SOLN: 5.0000 mg | Freq: Four times a day (QID) | INTRAMUSCULAR | Status: DC | PRN

## 2022-09-18 MED ORDER — DULOXETINE HCL 30 MG PO CPEP
60.0000 mg | ORAL_CAPSULE | Freq: Every day | ORAL | Status: DC
  Administered 2022-09-19 – 2022-09-28 (×10): 60 mg via ORAL
  Filled 2022-09-18 (×10): qty 2

## 2022-09-18 MED ORDER — PROCHLORPERAZINE 25 MG RE SUPP: 12.5000 mg | Freq: Four times a day (QID) | RECTAL | Status: DC | PRN

## 2022-09-18 MED ORDER — CARIPRAZINE HCL 1.5 MG PO CAPS
1.5000 mg | ORAL_CAPSULE | Freq: Every day | ORAL | Status: DC
  Administered 2022-09-19 – 2022-09-28 (×10): 1.5 mg via ORAL
  Filled 2022-09-18 (×10): qty 1

## 2022-09-18 MED ORDER — PROCHLORPERAZINE MALEATE 5 MG PO TABS: 5.0000 mg | ORAL_TABLET | Freq: Four times a day (QID) | ORAL | Status: DC | PRN

## 2022-09-18 MED ORDER — INSULIN ASPART 100 UNIT/ML IJ SOLN: 0.0000 [IU] | Freq: Three times a day (TID) | INTRAMUSCULAR | Status: DC

## 2022-09-18 MED ORDER — TRAZODONE HCL 50 MG PO TABS
25.0000 mg | ORAL_TABLET | Freq: Every evening | ORAL | Status: DC | PRN
  Administered 2022-09-22 – 2022-09-27 (×6): 50 mg via ORAL
  Filled 2022-09-18 (×6): qty 1

## 2022-09-18 MED ORDER — ENOXAPARIN SODIUM 40 MG/0.4ML IJ SOSY
40.0000 mg | PREFILLED_SYRINGE | INTRAMUSCULAR | Status: DC
  Administered 2022-09-18 – 2022-09-27 (×10): 40 mg via SUBCUTANEOUS
  Filled 2022-09-18 (×10): qty 0.4

## 2022-09-18 MED ORDER — ACETAMINOPHEN 325 MG PO TABS
325.0000 mg | ORAL_TABLET | ORAL | Status: DC | PRN
  Administered 2022-09-22 – 2022-09-26 (×3): 650 mg via ORAL
  Filled 2022-09-18 (×3): qty 2

## 2022-09-18 MED ORDER — GUAIFENESIN-DM 100-10 MG/5ML PO SYRP: 5.0000 mL | ORAL_SOLUTION | Freq: Four times a day (QID) | ORAL | Status: DC | PRN

## 2022-09-18 MED ORDER — DOCUSATE SODIUM 100 MG PO CAPS
100.0000 mg | ORAL_CAPSULE | Freq: Every day | ORAL | Status: DC
  Administered 2022-09-19: 100 mg via ORAL
  Filled 2022-09-18: qty 1

## 2022-09-18 MED ORDER — POLYETHYLENE GLYCOL 3350 17 G PO PACK
17.0000 g | PACK | Freq: Every day | ORAL | Status: DC | PRN
  Administered 2022-09-20 – 2022-09-27 (×4): 17 g via ORAL
  Filled 2022-09-18 (×4): qty 1

## 2022-09-18 MED ORDER — JUVEN PO PACK
1.0000 | PACK | Freq: Two times a day (BID) | ORAL | Status: DC
  Administered 2022-09-19 – 2022-09-28 (×18): 1 via ORAL
  Filled 2022-09-18 (×18): qty 1

## 2022-09-18 MED ORDER — PANTOPRAZOLE SODIUM 40 MG PO TBEC
40.0000 mg | DELAYED_RELEASE_TABLET | Freq: Two times a day (BID) | ORAL | Status: DC
  Administered 2022-09-18 – 2022-09-28 (×20): 40 mg via ORAL
  Filled 2022-09-18 (×20): qty 1
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
## 2022-09-18 NOTE — Progress Notes (Signed)
Patient ID: Taylor [name], female   DOB: [birth year], 32 y.o.   MRN: [id number] Patient without complaints this morning.  There is no drainage from the wound VAC canister.  Plan for discharge to inpatient rehab today.

## 2022-09-18 NOTE — Progress Notes (Addendum)
Inpatient Rehab Coordinator Note:  I met with pt at bedside during PT session to discuss CIR recommendations and goals/expectations of CIR stay.  We reviewed 3 hrs/day of therapy, physician follow up, and average length of stay 2 weeks (dependent upon progress) with goals of mod I.  She reports she is planning to d/c to her friend, Willy's, home where she will have level entry and 24/7 from his wife.  Alternate plan would be to discharge to her apartment (full flight to enter) with her father providing intermittent assist.  She has a 44 y/o daughter named [name] with Autism and she has support to care for her while she recovers.  I expect pt will easily reach mod I level with mobility and ADLs.  Await clearance from Dr. Lajoyce Corners to see if pt can admit today.    1127: Dr. Lajoyce Corners in agreement for pt to admit today.  Will let pt/family and TOC team know.   Estill Dooms, PT, DPT Admissions Coordinator [phone] 09/18/22  11:07 AM
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
## 2022-09-18 NOTE — Plan of Care (Signed)
  Problem: Self-Care: Goal: Ability to meet self-care needs will improve Outcome: Progressing   Problem: Pain Management: Goal: Pain level will decrease with appropriate interventions Outcome: Progressing   Problem: Skin Integrity: Goal: Demonstration of wound healing without infection will improve Outcome: Progressing   Problem: Education: Goal: Knowledge of General Education information will improve Description: Including pain rating scale, medication(s)/side effects and non-pharmacologic comfort measures Outcome: Progressing   

## 2022-09-18 NOTE — Progress Notes (Signed)
Inpatient Rehabilitation Admission Medication Review by a Pharmacist  A complete drug regimen review was completed for this patient to identify any potential clinically significant medication issues.  High Risk Drug Classes Is patient taking? Indication by Medication  Antipsychotic Yes Cariprazine - bipolar d/o Compazine - prn N/V  Anticoagulant Yes Lovenox - VTE prophylaxis  Antibiotic No   Opioid Yes Oxycodone - prn pain  Antiplatelet No   Hypoglycemics/insulin No   Vasoactive Medication Yes Lisinopril - BP  Chemotherapy No   Other Yes Bupropion, Duloxetine - mood Cyclobenzaprine - spasms Gabapentin - pain Modafinil - attention Pantoprazole - Reflux Trazodone - prn sleep     Type of Medication Issue Identified Description of Issue Recommendation(s)  Drug Interaction(s) (clinically significant)     Duplicate Therapy     Allergy     No Medication Administration End Date     Incorrect Dose     Additional Drug Therapy Needed     Significant med changes from prior encounter (inform family/care partners about these prior to discharge).    Other       Clinically significant medication issues were identified that warrant physician communication and completion of prescribed/recommended actions by midnight of the next day:  No  Pharmacist comments: None  Time spent performing this drug regimen review (minutes): 20 minutes  Thank you Okey Regal, PharmD
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
## 2022-09-18 NOTE — Discharge Summary (Signed)
Discharge Diagnoses:  Principal Problem:   S/P BKA (below knee amputation) unilateral, right (HCC) Active Problems:   Septic arthritis of right ankle (HCC)   Surgeries: Procedure(s): RIGHT BELOW KNEE AMPUTATION on 09/16/2022    Consultants:   Discharged Condition: Improved  Hospital Course: Taylor [name] is an 32 y.o. 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  Patient was brought to the operating room on 09/16/2022 and underwent Procedure(s): RIGHT BELOW KNEE AMPUTATION.    Patient was given perioperative antibiotics:  Anti-infectives (From admission, onward)    Start     Dose/Rate Route Frequency Ordered Stop   09/16/22 2100  ceFAZolin (ANCEF) IVPB 2g/100 mL premix        2 g 200 mL/hr over 30 Minutes Intravenous Every 8 hours 09/16/22 1450 09/17/22 0424   09/16/22 1045  ceFAZolin (ANCEF) IVPB 2g/100 mL premix        2 g 200 mL/hr over 30 Minutes Intravenous On call to O.R. 09/16/22 1041 09/16/22 1321     .  Patient was given sequential compression devices, early ambulation, and aspirin for DVT prophylaxis.  Recent vital signs: Patient Vitals for the past 24 hrs:  BP Temp Temp src Pulse Resp SpO2  09/18/22 0734 110/67 98.4 F (36.9 C) Oral 94 -- 94 %  09/18/22 0411 111/70 98.6 F (37 C) Oral 94 -- 94 %  09/17/22 2002 119/64 98.4 F (36.9 C) Oral 100 18 97 %  09/17/22 1452 127/69 97.6 F (36.4 C) Oral (!) 104 -- 93 %  .  Recent laboratory studies: No results found.  Discharge Medications:   Allergies as of 09/18/2022       Reactions   Other Itching   Animal Dander        Medication List     STOP taking these medications    doxycycline 100 MG tablet Commonly known as: VIBRA-TABS   mupirocin ointment 2 % Commonly known as: BACTROBAN   oxyCODONE 5 MG immediate release tablet Commonly known as: Oxy IR/ROXICODONE       TAKE these  medications    acetaminophen 650 MG CR tablet Commonly known as: TYLENOL Take 1,300 mg by mouth every 8 (eight) hours as needed for pain.   buPROPion 300 MG 24 hr tablet Commonly known as: WELLBUTRIN XL Take 300 mg by mouth every morning.   doxylamine (Sleep) 25 MG tablet Commonly known as: UNISOM Take 25 mg by mouth at bedtime as needed for sleep.   DULoxetine 30 MG capsule Commonly known as: CYMBALTA Take 30 mg by mouth in the morning. Take with the 60 mg to equal 90 mg   DULoxetine 60 MG capsule Commonly known as: CYMBALTA Take 60 mg by mouth daily. Take with 30 mg to equal 90 mg   lisinopril 40 MG tablet Commonly known as: ZESTRIL Take 1 tablet (40 mg total) by mouth daily.   Nuvigil 200 MG Tabs Generic drug: Armodafinil Take 1 tablet by mouth every morning.   pantoprazole 40 MG tablet Commonly known as: PROTONIX Take 1 tablet (40 mg total) by mouth 2 (two) times daily.   Vraylar 1.5 MG capsule Generic drug: cariprazine Take 1.5 mg by mouth daily.   zolpidem 6.25 MG CR tablet Commonly known as: AMBIEN CR Take 6.25 mg by mouth at bedtime.               Durable Medical Equipment  (From  admission, onward)           Start     Ordered   09/18/22 1152  For home use only DME Vac  Once       Comments: Discharge with the [employer] plus portable wound VAC pump to inpatient rehab.   09/18/22 1152            Diagnostic Studies: CT ANKLE RIGHT W CONTRAST  Result Date: 09/09/2022 CLINICAL DATA:  Foot pain and swelling. Conventional radiographs reveal fractured screws along the tibiotalar arthrodesis and possible bony erosion along the anterior plate. EXAM: CT OF THE RIGHT ANKLE WITH CONTRAST TECHNIQUE: Multidetector CT imaging of the right ankle was performed following the standard protocol during bolus administration of intravenous contrast. RADIATION DOSE REDUCTION: This exam was performed according to the departmental dose-optimization program which  includes automated exposure control, adjustment of the mA and/or kV according to patient size and/or use of iterative reconstruction technique. CONTRAST:  75mL OMNIPAQUE IOHEXOL 300 MG/ML  SOLN COMPARISON:  Multiple exams, including radiographs of 09/09/2022 FINDINGS: Bones/Joint/Cartilage All 3 of the tibial screws of the tibiotalar plate and screw fixator are fractured. Moreover, there has been marked bony resorption of the talar dome compared to 10/23/2021 resulting in exposure and lucency around two of the central talar fixation screws. There is abnormal lucency tracking around the two talar head/neck screws indicating loosening or infection. There is also lucency around the volar plate with thick periosteal reaction surrounding the proximal 4.2 cm of the plate. Thick periostitis noted around the distal tibia, this could be a manifestation of chronic osteomyelitis. Cystic lesions and bony irregularity and resorption along the tibial plafond and noted with marked loss of height and bony resorption of the talar dome and sclerosis in the remainder of the talus. No solid interbody bridging bone at the tibiotalar articulation. Chronic osteomyelitis involving the talus is likewise a distinct possibility. No complicating feature related to the two distal fibular retrograde cannulated screws. There is some residual deformity from prior fracture as well as small ossicles below the fibula, but no fibular nonunion. Mild proximal dorsal spurring of the navicular. There is posterior spurring of the posterior subtalar joint. Ligaments Suboptimally assessed by CT. Muscles and Tendons Abnormal thickened appearance of the tibialis anterior tendon volar to the tibiotalar articulation for example on image 97 series 7. Calcifications below the medial malleolus result in some indistinctness and displacement of the tibialis posterior tendon. Soft tissues Substantial volar edema tracking posterior and anterior to the tibialis anterior  tendon at the level of the tibiotalar articulation compatible with local inflammation and edema and more confluent than elsewhere. Strictly speaking I can not totally exclude the possibility of a draining sinus tract, correlate with any visible volar drainage along the ankle. There is prominent medial, lateral, and volar subcutaneous edema along the ankle and tracking into the dorsum of the foot. No drainable abscess. IMPRESSION: 1. 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 2. No solid interbody bridging bone at the tibiotalar articulation. 3. Abnormal thickened appearance of the tibialis anterior tendon volar to the tibiotalar articulation. Substantial volar edema tracking posterior and anterior to the tibialis anterior tendon at the level of the tibiotalar articulation compatible with local inflammation and  edema and more confluent than elsewhere. I can not totally exclude the possibility of a draining sinus tract, correlate with any visible volar drainage along the ankle. 4. Prominent medial, lateral, and volar subcutaneous edema along the ankle and tracking into the dorsum of the foot. 5. Mild posterior spurring of the posterior subtalar joint. 6. Mild proximal dorsal spurring of the navicular. Electronically Signed   By: Gaylyn Rong M.D.   On: 09/09/2022 18:30   CT FOOT RIGHT W CONTRAST  Result Date: 09/09/2022 CLINICAL DATA:  Chronic foot pain. Least partial failure of tibiotalar arthrodesis with new resorptive findings. EXAM: CT OF THE RIGHT FOOT WITH CONTRAST TECHNIQUE: Multidetector CT imaging of the lower right extremity was performed according to the standard protocol following  intravenous contrast administration. RADIATION DOSE REDUCTION: This exam was performed according to the departmental dose-optimization program which includes automated exposure control, adjustment of the mA and/or kV according to patient size and/or use of iterative reconstruction technique. CONTRAST:  75mL OMNIPAQUE IOHEXOL 300 MG/ML  SOLN COMPARISON:  Radiographs 09/09/2022 FINDINGS: Bones/Joint/Cartilage Imaging planes for the dedicated foot exam are chosen with regard to the midfoot and forefoot rather than the hindfoot. Hindfoot foot findings will be discussed on the dedicated ankle CT. There are mild degenerative findings along the first metatarsal head the adjacent sesamoids. No malalignment at the Lisfranc joint. The phalanges and metatarsals appear otherwise unremarkable. Small os peroneus noted. There is some bony demineralization observed particularly in the midfoot which may reflect osteoporosis of disuse. Ligaments Suboptimally assessed by CT. Muscles and Tendons Mild flexor digitorum longus tenosynovitis just past the knot of Henry. Soft tissues Substantial subcutaneous edema tracks dorsally along the forefoot, and to a lesser extent along the lateral plantar ball of the foot. This is nonspecific with regard to cellulitis versus sterile edema. No abscess identified. IMPRESSION: 1. Substantial subcutaneous edema tracks dorsally along the forefoot, and to a lesser extent along the lateral plantar ball of the foot. This is nonspecific with regard to cellulitis versus sterile edema. No abscess identified. 2. Mild flexor digitorum longus tenosynovitis just past the knot of Henry. 3. Mild degenerative findings along the first metatarsal head and adjacent sesamoids. 4. Bony demineralization particularly in the midfoot may reflect osteoporosis of disuse. 5. Please note, tibiotalar and hindfoot related findings are communicated under the dedicated CT ankle report. Electronically Signed   By: Gaylyn Rong  M.D.   On: 09/09/2022 18:18   US Venous Img Lower Right (DVT Study)  Result Date: 09/09/2022 CLINICAL DATA:  Pain and swelling EXAM: Right LOWER EXTREMITY VENOUS DOPPLER ULTRASOUND TECHNIQUE: Gray-scale sonography with compression, as well as color and duplex ultrasound, were performed to evaluate the deep venous system(s) from the level of the common femoral vein through the popliteal and proximal calf veins. COMPARISON:  None Available. FINDINGS: VENOUS Normal compressibility of the common femoral, superficial femoral, and popliteal veins, as well as the visualized calf veins. Visualized portions of profunda femoral vein and great saphenous vein unremarkable. No filling defects to suggest DVT on grayscale or color Doppler imaging. Doppler waveforms show normal direction of venous flow, normal respiratory plasticity and response to augmentation. Limited views of the contralateral common femoral vein are unremarkable. OTHER There is a reniform shaped lymph node in the right inguinal region with fatty hilum suggesting possible reactive hyperplasia of lymph node. There is edema in subcutaneous plane in the right lower leg without any loculated fluid collections. Limitations: none IMPRESSION: There is no evidence of deep venous thrombosis  in right lower extremity. Electronically Signed   By: Ernie Avena M.D.   On: 09/09/2022 15:58   DG Foot Complete Right  Result Date: 09/09/2022 CLINICAL DATA:  Lower extremity edema EXAM: RIGHT FOOT COMPLETE - 3+ VIEW; RIGHT ANKLE - COMPLETE 3+ VIEW COMPARISON:  09/05/2021 FINDINGS: Prior tibiotalar arthrodesis with anterior plate and screw construct. There is a new fracture through the proximal most screw within the distal tibial metadiaphysis. The additional tibial screws are chronically fractured. Increasing perihardware lucency about the screws within the talar dome, which appear to be backing out. There is increasing lucency around the tibial plate. Additional  screws within the posterior talus appear intact. Intact lateral malleolar screws. Erosive or resorptive changes within the talar dome and tibial plafond, new from prior. No evidence of acute fracture or dislocation. Marked diffuse soft tissue swelling. IMPRESSION: 1. Prior tibiotalar arthrodesis with evidence of hardware loosening including multiple fractured screws in the tibia and increasing perihardware lucency. 2. Erosive or resorptive changes within the talar dome and tibial plafond, new from prior. Correlate for signs of infection as septic arthritis and osteomyelitis could have this appearance. 3. Marked diffuse soft tissue swelling of the right foot and ankle. Electronically Signed   By: Duanne Guess D.O.   On: 09/09/2022 15:39   DG Ankle Complete Right  Result Date: 09/09/2022 CLINICAL DATA:  Lower extremity edema EXAM: RIGHT FOOT COMPLETE - 3+ VIEW; RIGHT ANKLE - COMPLETE 3+ VIEW COMPARISON:  09/05/2021 FINDINGS: Prior tibiotalar arthrodesis with anterior plate and screw construct. There is a new fracture through the proximal most screw within the distal tibial metadiaphysis. The additional tibial screws are chronically fractured. Increasing perihardware lucency about the screws within the talar dome, which appear to be backing out. There is increasing lucency around the tibial plate. Additional screws within the posterior talus appear intact. Intact lateral malleolar screws. Erosive or resorptive changes within the talar dome and tibial plafond, new from prior. No evidence of acute fracture or dislocation. Marked diffuse soft tissue swelling. IMPRESSION: 1. Prior tibiotalar arthrodesis with evidence of hardware loosening including multiple fractured screws in the tibia and increasing perihardware lucency. 2. Erosive or resorptive changes within the talar dome and tibial plafond, new from prior. Correlate for signs of infection as septic arthritis and osteomyelitis could have this appearance. 3.  Marked diffuse soft tissue swelling of the right foot and ankle. Electronically Signed   By: Duanne Guess D.O.   On: 09/09/2022 15:39    Patient benefited maximally from their hospital stay and there were no complications.     Disposition: Discharge disposition: 02-Transferred to [facility]      Discharge Instructions     Call MD / Call 911   Complete by: As directed    If you experience chest pain or shortness of breath, CALL 911 and be transported to the hospital emergency room.  If you develope a fever above 101 F, pus (white drainage) or increased drainage or redness at the wound, or calf pain, call your surgeon's office.   Constipation Prevention   Complete by: As directed    Drink plenty of fluids.  Prune juice may be helpful.  You may use a stool softener, such as Colace (over the counter) 100 mg twice a day.  Use MiraLax (over the counter) for constipation as needed.   Diet - low sodium heart healthy   Complete by: As directed    Increase activity slowly as tolerated   Complete by: As  directed    Post-operative opioid taper instructions:   Complete by: As directed    POST-OPERATIVE OPIOID TAPER INSTRUCTIONS: It is important to wean off of your opioid medication as soon as possible. If you do not need pain medication after your surgery it is ok to stop day one. Opioids include: Codeine, Hydrocodone(Norco, Vicodin), Oxycodone(Percocet, oxycontin) and hydromorphone amongst others.  Long term and even short term use of opiods can cause: Increased pain response Dependence Constipation Depression Respiratory depression And more.  Withdrawal symptoms can include Flu like symptoms Nausea, vomiting And more Techniques to manage these symptoms Hydrate well Eat regular healthy meals Stay active Use relaxation techniques(deep breathing, meditating, yoga) Do Not substitute Alcohol to help with tapering If you have been on opioids for less than two weeks and do  not have pain than it is ok to stop all together.  Plan to wean off of opioids This plan should start within one week post op of your joint replacement. Maintain the same interval or time between taking each dose and first decrease the dose.  Cut the total daily intake of opioids by one tablet each day Next start to increase the time between doses. The last dose that should be eliminated is the evening dose.            Signed: Nadara Mustard 09/18/2022, 11:52 AM

## 2022-09-19 DIAGNOSIS — S88111S Complete traumatic amputation at level between knee and ankle, right lower leg, sequela: Secondary | ICD-10-CM

## 2022-09-19 DIAGNOSIS — E876 Hypokalemia: Secondary | ICD-10-CM

## 2022-09-19 DIAGNOSIS — K5901 Slow transit constipation: Secondary | ICD-10-CM | POA: Diagnosis not present

## 2022-09-19 LAB — COMPREHENSIVE METABOLIC PANEL
ALT: 19 U/L (ref 0–44)
AST: 18 U/L (ref 15–41)
Albumin: 2.8 g/dL — ABNORMAL LOW (ref 3.5–5.0)
Alkaline Phosphatase: 78 U/L (ref 38–126)
Anion gap: 9 (ref 5–15)
BUN: 8 mg/dL (ref 6–20)
CO2: 26 mmol/L (ref 22–32)
Calcium: 8.9 mg/dL (ref 8.9–10.3)
Chloride: 102 mmol/L (ref 98–111)
Creatinine, Ser: 0.56 mg/dL (ref 0.44–1.00)
GFR, Estimated: >60 mL/min (ref >60)
Glucose, Bld: 106 mg/dL — ABNORMAL HIGH (ref 70–99)
Potassium: 3.3 mmol/L — ABNORMAL LOW (ref 3.5–5.1)
Sodium: 137 mmol/L (ref 135–145)
Total Bilirubin: 0.5 mg/dL (ref 0.3–1.2)
Total Protein: 6.4 g/dL — ABNORMAL LOW (ref 6.5–8.1)

## 2022-09-19 LAB — CBC WITH DIFFERENTIAL/PLATELET
Abs Immature Granulocytes: 0.06 10*3/uL (ref 0.00–0.07)
Basophils Absolute: 0 10*3/uL (ref 0.0–0.1)
Basophils Relative: 0 %
Eosinophils Absolute: 0.2 10*3/uL (ref 0.0–0.5)
Eosinophils Relative: 2 %
HCT: 30.8 % — ABNORMAL LOW (ref 36.0–46.0)
Hemoglobin: 9.7 g/dL — ABNORMAL LOW (ref 12.0–15.0)
Immature Granulocytes: 1 %
Lymphocytes Relative: 16 %
Lymphs Abs: 1.4 10*3/uL (ref 0.7–4.0)
MCH: 30.3 pg (ref 26.0–34.0)
MCHC: 31.5 g/dL (ref 30.0–36.0)
MCV: 96.3 fL (ref 80.0–100.0)
Monocytes Absolute: 0.7 10*3/uL (ref 0.1–1.0)
Monocytes Relative: 8 %
Neutro Abs: 6.5 10*3/uL (ref 1.7–7.7)
Neutrophils Relative %: 73 %
Platelets: 251 10*3/uL (ref 150–400)
RBC: 3.2 MIL/uL — ABNORMAL LOW (ref 3.87–5.11)
RDW: 14.3 % (ref 11.5–15.5)
WBC: 8.9 10*3/uL (ref 4.0–10.5)
nRBC: 0 % (ref 0.0–0.2)

## 2022-09-19 LAB — MAGNESIUM: Magnesium: 2.1 mg/dL (ref 1.7–2.4)

## 2022-09-19 MED ORDER — DOCUSATE SODIUM 100 MG PO CAPS
100.0000 mg | ORAL_CAPSULE | Freq: Two times a day (BID) | ORAL | Status: DC
  Administered 2022-09-19 – 2022-09-28 (×18): 100 mg via ORAL
  Filled 2022-09-19 (×18): qty 1

## 2022-09-19 NOTE — Progress Notes (Signed)
PROGRESS NOTE   Subjective/Complaints:  Pt doing well today, states she slept ok and her pain is managed with the meds. LBM was yesterday but was small and hard, agreeable with increasing colace. Urinating well. Denies any other complaints or concerns today.   ROS: Denies fevers, chills, CP, SOB, abd pain, N/V/D/C, new/worsening paresthesias/weakness, or any other complaints at this time.     Objective:   No results found. Recent Labs    09/18/22 0348 09/19/22 0718  WBC 11.1* 8.9  HGB 9.8* 9.7*  HCT 29.0* 30.8*  PLT 241 251   Recent Labs    09/18/22 0348 09/19/22 0718  NA 136 137  K 3.0* 3.3*  CL 99 102  CO2 27 26  GLUCOSE 105* 106*  BUN 9 8  CREATININE 0.54 0.56  CALCIUM 8.8* 8.9    Intake/Output Summary (Last 24 hours) at 09/19/2022 1105 Last data filed at 09/18/2022 2230 Gross per 24 hour  Intake 480 ml  Output --  Net 480 ml        Physical Exam: Vital Signs Blood pressure 109/62, pulse 89, temperature 98.7 F (37.1 C), resp. rate 18, height 5\' 4"  (1.626 m), SpO2 96 %.  Constitutional:      Appearance: Normal appearance.  Obese Edentulous Cardiovascular:     Rate and Rhythm: Regular rhythm. Reg rate. No m/r/g Pulm: CTAB, no w/r/r Abdominal:     General: Abdomen is flat.     Palpations: Abdomen is soft. Nontender, nondistended, +BS throughout, no r/g/r.  MSK: Wound vac on R BKA, no output  PRIOR EXAMS: Musculoskeletal:     Comments: Wound VAC in place on R-BKA site -  no current output Full active range of motion of bilateral upper and left lower extremity Right lower extremity status post BKA; limited by pain, 4 out of 5 in hip flexor, knee extensor.   Neurological:     Mental Status: She is alert and oriented to person, place, and time.  Sensory deficit in bilateral hands over median nerve distribution, difficulty with fine motor testing bilaterally Reflexes 2+ throughout    Psychiatric:        Mood and Affect: Mood normal.        Behavior: Behavior normal.  Skin: No apparent lesions.  IV site looks clean dry intact.  Wound VAC over BKA distal site  Assessment/Plan: 1. Functional deficits which require 3+ hours per day of interdisciplinary therapy in a comprehensive inpatient rehab setting. Physiatrist is providing close team supervision and 24 hour management of active medical problems listed below. Physiatrist and rehab team continue to assess barriers to discharge/monitor patient progress toward functional and medical goals  Care Tool:  Bathing    Body parts bathed by patient: Right arm, Left arm, Chest, Abdomen, Front perineal area, Buttocks, Right upper leg, Left upper leg, Left lower leg, Face     Body parts n/a: Right lower leg (R BKA)   Bathing assist Assist Level: Minimal Assistance - Patient > 75%     Upper Body Dressing/Undressing Upper body dressing   What is the patient wearing?: Pull over shirt    Upper body assist Assist Level: Set up assist  Lower Body Dressing/Undressing Lower body dressing      What is the patient wearing?: Pants, Orthosis, Ace wrap/stump shrinker     Lower body assist Assist for lower body dressing: Maximal Assistance - Patient 25 - 49%     Toileting Toileting    Toileting assist Assist for toileting: Minimal Assistance - Patient > 75%     Transfers Chair/bed transfer  Transfers assist           Locomotion Ambulation   Ambulation assist              Walk 10 feet activity   Assist           Walk 50 feet activity   Assist           Walk 150 feet activity   Assist           Walk 10 feet on uneven surface  activity   Assist           Wheelchair     Assist               Wheelchair 50 feet with 2 turns activity    Assist            Wheelchair 150 feet activity     Assist          Blood pressure 109/62, pulse 89,  temperature 98.7 F (37.1 C), resp. rate 18, height 5\' 4"  (1.626 m), SpO2 96 %.  Medical Problem List and Plan: 1. Functional deficits secondary to right BKA             -patient may not shower until wound vac is removed             -ELOS/Goals: 9-12 days, Mod I PT/OT  -CIR evals -- Poor tolerance of limb guard d/t cutting into thigh; OK to remove in bed, reminded to keep knee extended   2.  Antithrombotics: -DVT/anticoagulation:  Pharmaceutical: Lovenox 40mg  QD             -antiplatelet therapy: ASA 325mg  QD 3. Pain Management: Oxycodone 10-15 mg prn for pain--discussed transition and use oral narcotics --change robaxin to flexeril 5mg  TID scheduled and prn doses as having many different issues.  4. Mood/Behavior/Sleep: LCSW to follow for evaluation and support.              -antipsychotic agents: Vraylar 1.5mg  daily  -Trazodone PRN 5. Neuropsych/cognition: This patient is capable of making decisions on her own behalf. 6. Skin/Wound Care: Routine pressure relief measures.  7. Fluids/Electrolytes/Nutrition: Monitor I/O. Check CMET in am             --continue Juven, Vitamin C and Zinc to promote wound healing.  8. HTN: Monitor BP TID--on Lisinopril 40mg  daily  -09/19/22 BPs stable, monitor Vitals:   09/18/22 1622 09/18/22 1943 09/19/22 0504  BP: 114/63 (!) 109/58 109/62    9. Neuropathy/chronic pain/CTS: On Gabapentin 300 mg TID with Cymbalta 90 mg/day             -- Consider addition of bilateral UE neutral wrist splints for CTS 10. H/o Bipolar d/o: Stable on Wellburtrin 300 mg w/Vraylar 1.5 mg             --on provigil 100mg  QD for activation 11. Hypokalemia: Will supplement X 6 doses. Check in am and on Monday.   -09/19/22 K 3.3, continue supplementation of BID for now 12. H/o CALR +Leucocytosis: Has had rise from 4.4-->11.1 but likely  baseline.  --Intermittently ranges from normal to 17.1 due to CALR+ and nicotine use per Hem/Onc --Monitor for fevers or other signs of  infection.  -09/19/22 WBC 8.9, monitor on routine labs 13. Mild OSA--no treatment  needed per last study 07/2020. 14. H/o Gastritis/Empiric dilatation: GI symptoms managed with Protonix 40mg  BID 15. Constipation:  -09/19/22 small hard BMs yesterday, increase colace to 100mg  BID, has PRNs as well, monitor    LOS: 1 days A FACE TO FACE EVALUATION WAS PERFORMED  [address] 09/19/2022, 11:05 AM
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
## 2022-09-19 NOTE — Plan of Care (Signed)
  Problem: RH Balance Goal: LTG Patient will maintain dynamic standing with ADLs (OT) Description: LTG:  Patient will maintain dynamic standing balance with assist during activities of daily living (OT)  Flowsheets (Taken 09/19/2022 1206) LTG: Pt will maintain dynamic standing balance during ADLs with: Independent with assistive device   Problem: Sit to Stand Goal: LTG:  Patient will perform sit to stand in prep for activites of daily living with assistance level (OT) Description: LTG:  Patient will perform sit to stand in prep for activites of daily living with assistance level (OT) Flowsheets (Taken 09/19/2022 1206) LTG: PT will perform sit to stand in prep for activites of daily living with assistance level: Independent with assistive device   Problem: RH Bathing Goal: LTG Patient will bathe all body parts with assist levels (OT) Description: LTG: Patient will bathe all body parts with assist levels (OT) Flowsheets (Taken 09/19/2022 1206) LTG: Pt will perform bathing with assistance level/cueing: Independent with assistive device    Problem: RH Dressing Goal: LTG Patient will perform lower body dressing w/assist (OT) Description: LTG: Patient will perform lower body dressing with assist, with/without cues in positioning using equipment (OT) Flowsheets (Taken 09/19/2022 1206) LTG: Pt will perform lower body dressing with assistance level of: Independent with assistive device   Problem: RH Toileting Goal: LTG Patient will perform toileting task (3/3 steps) with assistance level (OT) Description: LTG: Patient will perform toileting task (3/3 steps) with assistance level (OT)  Flowsheets (Taken 09/19/2022 1206) LTG: Pt will perform toileting task (3/3 steps) with assistance level: Independent with assistive device   Problem: RH Simple Meal Prep Goal: LTG Patient will perform simple meal prep w/assist (OT) Description: LTG: Patient will perform simple meal prep with assistance, with/without  cues (OT). Flowsheets (Taken 09/19/2022 1206) LTG: Pt will perform simple meal prep with assistance level of: Independent with assistive device   Problem: RH Toilet Transfers Goal: LTG Patient will perform toilet transfers w/assist (OT) Description: LTG: Patient will perform toilet transfers with assist, with/without cues using equipment (OT) Flowsheets (Taken 09/19/2022 1206) LTG: Pt will perform toilet transfers with assistance level of: Independent with assistive device   Problem: RH Tub/Shower Transfers Goal: LTG Patient will perform tub/shower transfers w/assist (OT) Description: LTG: Patient will perform tub/shower transfers with assist, with/without cues using equipment (OT) Flowsheets (Taken 09/19/2022 1206) LTG: Pt will perform tub/shower stall transfers with assistance level of: Independent with assistive device

## 2022-09-19 NOTE — Plan of Care (Signed)
  Problem: RH Balance Goal: LTG Patient will maintain dynamic standing balance (PT) Description: LTG:  Patient will maintain dynamic standing balance with assistance during mobility activities (PT) Flowsheets (Taken 09/19/2022 1720) LTG: Pt will maintain dynamic standing balance during mobility activities with:: Independent with assistive device    Problem: Sit to Stand Goal: LTG:  Patient will perform sit to stand with assistance level (PT) Description: LTG:  Patient will perform sit to stand with assistance level (PT) Flowsheets (Taken 09/19/2022 1720) LTG: PT will perform sit to stand in preparation for functional mobility with assistance level: Independent with assistive device   Problem: RH Bed Mobility Goal: LTG Patient will perform bed mobility with assist (PT) Description: LTG: Patient will perform bed mobility with assistance, with/without cues (PT). Flowsheets (Taken 09/19/2022 1720) LTG: Pt will perform bed mobility with assistance level of: Independent with assistive device    Problem: RH Bed to Chair Transfers Goal: LTG Patient will perform bed/chair transfers w/assist (PT) Description: LTG: Patient will perform bed to chair transfers with assistance (PT). Flowsheets (Taken 09/19/2022 1720) LTG: Pt will perform Bed to Chair Transfers with assistance level: Independent with assistive device    Problem: RH Car Transfers Goal: LTG Patient will perform car transfers with assist (PT) Description: LTG: Patient will perform car transfers with assistance (PT). Flowsheets (Taken 09/19/2022 1720) LTG: Pt will perform car transfers with assist:: Independent with assistive device    Problem: RH Ambulation Goal: LTG Patient will ambulate in controlled environment (PT) Description: LTG: Patient will ambulate in a controlled environment, # of feet with assistance (PT). Flowsheets (Taken 09/19/2022 1720) LTG: Pt will ambulate in controlled environ  assist needed:: Independent with assistive  device LTG: Ambulation distance in controlled environment: 20'   Problem: RH Wheelchair Mobility Goal: LTG Patient will propel w/c in controlled environment (PT) Description: LTG: Patient will propel wheelchair in controlled environment, # of feet with assist (PT) Flowsheets (Taken 09/19/2022 1720) LTG: Pt will propel w/c in controlled environ  assist needed:: Supervision/Verbal cueing LTG: Propel w/c distance in controlled environment: 150' Goal: LTG Patient will propel w/c in home environment (PT) Description: LTG: Patient will propel wheelchair in home environment, # of feet with assistance (PT). Flowsheets (Taken 09/19/2022 1720) LTG: Pt will propel w/c in home environ  assist needed:: Independent with assistive device Distance: wheelchair distance in controlled environment: 50   Problem: RH Stairs Goal: LTG Patient will ambulate up and down stairs w/assist (PT) Description: LTG: Patient will ambulate up and down # of stairs with assistance (PT) Flowsheets (Taken 09/19/2022 1720) LTG: Pt will ambulate up/down stairs assist needed:: Minimal Assistance - Patient > 75% LTG: Pt will  ambulate up and down number of stairs: 12 Note: With LHR to simulate home environment
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
## 2022-09-20 DIAGNOSIS — E876 Hypokalemia: Secondary | ICD-10-CM | POA: Diagnosis not present

## 2022-09-20 DIAGNOSIS — S88111S Complete traumatic amputation at level between knee and ankle, right lower leg, sequela: Secondary | ICD-10-CM | POA: Diagnosis not present

## 2022-09-20 DIAGNOSIS — K5901 Slow transit constipation: Secondary | ICD-10-CM | POA: Diagnosis not present

## 2022-09-20 NOTE — Progress Notes (Signed)
PROGRESS NOTE   Subjective/Complaints:  Pt doing well again today, states she slept well and her pain is managed with the meds. LBM was yesterday but still a little hard, wants to try to have a BM now and if it's still hard she'll ask for the miralax. Urinating well. Denies any other complaints or concerns today.   ROS: +constipation. Denies fevers, chills, CP, SOB, abd pain, N/V/D, new/worsening paresthesias/weakness, or any other complaints at this time.     Objective:   No results found. Recent Labs    09/18/22 0348 09/19/22 0718  WBC 11.1* 8.9  HGB 9.8* 9.7*  HCT 29.0* 30.8*  PLT 241 251   Recent Labs    09/18/22 0348 09/19/22 0718  NA 136 137  K 3.0* 3.3*  CL 99 102  CO2 27 26  GLUCOSE 105* 106*  BUN 9 8  CREATININE 0.54 0.56  CALCIUM 8.8* 8.9    Intake/Output Summary (Last 24 hours) at 09/20/2022 1054 Last data filed at 09/20/2022 0730 Gross per 24 hour  Intake 1453 ml  Output --  Net 1453 ml        Physical Exam: Vital Signs Blood pressure 124/66, pulse 77, temperature 98.1 F (36.7 C), temperature source Oral, resp. rate 18, height 5\' 4"  (1.626 m), SpO2 96 %.  Constitutional:      Appearance: Normal appearance.  Obese Edentulous, laying in bed.  Cardiovascular:     Rate and Rhythm: Regular rhythm. Reg rate. No m/r/g Pulm: CTAB, no w/r/r Abdominal:     General: Abdomen is flat.     Palpations: Abdomen is soft. Nontender, nondistended, +BS throughout, no r/g/r.  MSK: Wound vac on R BKA, no output  PRIOR EXAMS: Musculoskeletal:     Comments: Wound VAC in place on R-BKA site -  no current output Full active range of motion of bilateral upper and left lower extremity Right lower extremity status post BKA; limited by pain, 4 out of 5 in hip flexor, knee extensor.   Neurological:     Mental Status: She is alert and oriented to person, place, and time.  Sensory deficit in bilateral hands over  median nerve distribution, difficulty with fine motor testing bilaterally Reflexes 2+ throughout   Psychiatric:        Mood and Affect: Mood normal.        Behavior: Behavior normal.  Skin: No apparent lesions.  IV site looks clean dry intact.  Wound VAC over BKA distal site  Assessment/Plan: 1. Functional deficits which require 3+ hours per day of interdisciplinary therapy in a comprehensive inpatient rehab setting. Physiatrist is providing close team supervision and 24 hour management of active medical problems listed below. Physiatrist and rehab team continue to assess barriers to discharge/monitor patient progress toward functional and medical goals  Care Tool:  Bathing    Body parts bathed by patient: Right arm, Left arm, Chest, Abdomen, Front perineal area, Buttocks, Right upper leg, Left upper leg, Left lower leg, Face     Body parts n/a: Right lower leg (R BKA)   Bathing assist Assist Level: Minimal Assistance - Patient > 75%     Upper Body Dressing/Undressing Upper body dressing  What is the patient wearing?: Pull over shirt    Upper body assist Assist Level: Set up assist    Lower Body Dressing/Undressing Lower body dressing      What is the patient wearing?: Pants, Orthosis, Ace wrap/stump shrinker     Lower body assist Assist for lower body dressing: Maximal Assistance - Patient 25 - 49%     Toileting Toileting    Toileting assist Assist for toileting: Minimal Assistance - Patient > 75%     Transfers Chair/bed transfer  Transfers assist     Chair/bed transfer assist level: Contact Guard/Touching assist     Locomotion Ambulation   Ambulation assist      Assist level: Contact Guard/Touching assist Assistive device: Walker-rolling Max distance: 10'   Walk 10 feet activity   Assist     Assist level: Contact Guard/Touching assist Assistive device: Walker-rolling   Walk 50 feet activity   Assist Walk 50 feet with 2 turns activity  did not occur: Safety/medical concerns         Walk 150 feet activity   Assist Walk 150 feet activity did not occur: Safety/medical concerns         Walk 10 feet on uneven surface  activity   Assist Walk 10 feet on uneven surfaces activity did not occur: Safety/medical concerns         Wheelchair     Assist Is the patient using a wheelchair?: Yes Type of Wheelchair: Manual    Wheelchair assist level: Supervision/Verbal cueing Max wheelchair distance: 150'    Wheelchair 50 feet with 2 turns activity    Assist        Assist Level: Supervision/Verbal cueing   Wheelchair 150 feet activity     Assist      Assist Level: Supervision/Verbal cueing   Blood pressure 124/66, pulse 77, temperature 98.1 F (36.7 C), temperature source Oral, resp. rate 18, height 5\' 4"  (1.626 m), SpO2 96 %.  Medical Problem List and Plan: 1. Functional deficits secondary to right BKA             -patient may not shower until wound vac is removed             -ELOS/Goals: 9-12 days, Mod I PT/OT  -Continue CIR  -- Poor tolerance of limb guard d/t cutting into thigh; OK to remove in bed, reminded to keep knee extended   2.  Antithrombotics: -DVT/anticoagulation:  Pharmaceutical: Lovenox 40mg  QD             -antiplatelet therapy: ASA 325mg  QD 3. Pain Management: Oxycodone 10-15 mg prn for pain--discussed transition and use oral narcotics --change robaxin to flexeril 5mg  TID scheduled and prn doses as having many different issues.  4. Mood/Behavior/Sleep: LCSW to follow for evaluation and support.              -antipsychotic agents: Vraylar 1.5mg  daily  -Trazodone PRN 5. Neuropsych/cognition: This patient is capable of making decisions on her own behalf. 6. Skin/Wound Care: Routine pressure relief measures.  7. Fluids/Electrolytes/Nutrition: Monitor I/O. Check CMET in am             --continue Juven, Vitamin C and Zinc to promote wound healing.  8. HTN: Monitor BP TID--on  Lisinopril 40mg  daily  -5/11-12/24 BPs stable, monitor Vitals:   09/18/22 1622 09/18/22 1943 09/19/22 0504 09/19/22 1259  BP: 114/63 (!) 109/58 109/62 112/67   09/19/22 1937 09/20/22 0434  BP: 123/72 124/66    9. Neuropathy/chronic pain/CTS: On  Gabapentin 300 mg TID with Cymbalta 90 mg/day             -- Consider addition of bilateral UE neutral wrist splints for CTS 10. H/o Bipolar d/o: Stable on Wellburtrin 300 mg w/Vraylar 1.5 mg             --on provigil 100mg  QD for activation 11. Hypokalemia: Will supplement X 6 doses. Check in am and on Monday.   -09/19/22 K 3.3, continue supplementation of BID for now 12. H/o CALR +Leucocytosis: Has had rise from 4.4-->11.1 but likely baseline.  --Intermittently ranges from normal to 17.1 due to CALR+ and nicotine use per Hem/Onc --Monitor for fevers or other signs of infection.  -09/19/22 WBC 8.9, monitor on routine labs 13. Mild OSA--no treatment  needed per last study 07/2020. 14. H/o Gastritis/Empiric dilatation: GI symptoms managed with Protonix 40mg  BID 15. Constipation:  -09/19/22 small hard BMs yesterday, increase colace to 100mg  BID, has PRNs as well, monitor -09/20/22 still with harder BMs, will try miralax today if persistent, advised we can schedule that if she desires (rather than just PRN); monitor    LOS: 2 days A FACE TO FACE EVALUATION WAS PERFORMED  [address] 09/20/2022, 10:54 AM

## 2022-09-20 NOTE — Progress Notes (Signed)
Patient reporting new area of erythema of LLE, posterior and medial from knee crease superiorly. RN outlined area of erythema, temp WDL. MD called. Advised if other symptoms present such as itching to notify MD

## 2022-09-21 DIAGNOSIS — K5900 Constipation, unspecified: Secondary | ICD-10-CM

## 2022-09-21 DIAGNOSIS — D72829 Elevated white blood cell count, unspecified: Secondary | ICD-10-CM | POA: Diagnosis not present

## 2022-09-21 DIAGNOSIS — E876 Hypokalemia: Secondary | ICD-10-CM | POA: Diagnosis not present

## 2022-09-21 DIAGNOSIS — S88111S Complete traumatic amputation at level between knee and ankle, right lower leg, sequela: Secondary | ICD-10-CM | POA: Diagnosis not present

## 2022-09-21 DIAGNOSIS — I10 Essential (primary) hypertension: Secondary | ICD-10-CM

## 2022-09-21 LAB — BASIC METABOLIC PANEL
Anion gap: 12 (ref 5–15)
BUN: 15 mg/dL (ref 6–20)
CO2: 22 mmol/L (ref 22–32)
Calcium: 9.3 mg/dL (ref 8.9–10.3)
Chloride: 102 mmol/L (ref 98–111)
Creatinine, Ser: 0.61 mg/dL (ref 0.44–1.00)
GFR, Estimated: >60 mL/min (ref >60)
Glucose, Bld: 95 mg/dL (ref 70–99)
Potassium: 4.3 mmol/L (ref 3.5–5.1)
Sodium: 136 mmol/L (ref 135–145)

## 2022-09-21 LAB — CBC
HCT: 33.8 % — ABNORMAL LOW (ref 36.0–46.0)
Hemoglobin: 11.1 g/dL — ABNORMAL LOW (ref 12.0–15.0)
MCH: 31.4 pg (ref 26.0–34.0)
MCHC: 32.8 g/dL (ref 30.0–36.0)
MCV: 95.5 fL (ref 80.0–100.0)
Platelets: 287 10*3/uL (ref 150–400)
RBC: 3.54 MIL/uL — ABNORMAL LOW (ref 3.87–5.11)
RDW: 14.2 % (ref 11.5–15.5)
WBC: 10.1 10*3/uL (ref 4.0–10.5)
nRBC: 0 % (ref 0.0–0.2)

## 2022-09-21 LAB — SURGICAL PATHOLOGY

## 2022-09-21 MED ORDER — POTASSIUM CHLORIDE CRYS ER 20 MEQ PO TBCR
20.0000 meq | EXTENDED_RELEASE_TABLET | Freq: Every day | ORAL | Status: DC
  Administered 2022-09-22 – 2022-09-28 (×7): 20 meq via ORAL
  Filled 2022-09-21 (×7): qty 1

## 2022-09-21 MED ORDER — ACETAMINOPHEN 325 MG PO TABS: 325.0000 mg | ORAL_TABLET | ORAL | Status: DC | PRN

## 2022-09-21 NOTE — Progress Notes (Signed)
Inpatient Rehabilitation Center Individual Statement of Services  Patient Name:  Taylor [name]  Date:  09/21/2022  Welcome to the Inpatient Rehabilitation Center.  Our goal is to provide you with an individualized program based on your diagnosis and situation, designed to meet your specific needs.  With this comprehensive rehabilitation program, you will be expected to participate in at least 3 hours of rehabilitation therapies Monday-Friday, with modified therapy programming on the weekends.  Your rehabilitation program will include the following services:  Physical Therapy (PT), Occupational Therapy (OT), 24 hour per day rehabilitation nursing, Therapeutic Recreaction (TR), Neuropsychology, Care Coordinator, Rehabilitation Medicine, Nutrition Services, and Pharmacy Services  Weekly team conferences will be held on wednesday to discuss your progress.  Your Inpatient Rehabilitation Care Coordinator will talk with you frequently to get your input and to update you on team discussions.  Team conferences with you and your family in attendance may also be held.  Expected length of stay: 7-10 days  Overall anticipated outcome: supervision-mod/I level  Depending on your progress and recovery, your program may change. Your Inpatient Rehabilitation Care Coordinator will coordinate services and will keep you informed of any changes. Your Inpatient Rehabilitation Care Coordinator's name and contact numbers are listed  below.  The following services may also be recommended but are not provided by the Inpatient Rehabilitation Center:  Driving Evaluations Home Health Rehabiltiation Services Outpatient Rehabilitation Services    Arrangements will be made to provide these services after discharge if needed.  Arrangements include referral to agencies that provide these services.  Your insurance has been verified to be:  Medicare and Medicaid Your primary doctor is:  [name]  Pertinent  information will be shared with your doctor and your insurance company.  Inpatient Rehabilitation Care Coordinator:  Dossie Der, [place] [phone] or Luna Glasgow  Information discussed with and copy given to patient by: Lucy Chris, 09/21/2022, 10:34 AM
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
## 2022-09-21 NOTE — Discharge Instructions (Addendum)
Inpatient Rehab Discharge Instructions  Taylor [name] Discharge date and time:  09/28/22  Activities/Precautions/ Functional Status: Activity: no lifting, driving, or strenuous exercise  till cleared by M d Diet: regular diet Wound Care: keep wound clean and dry. Wash with dial soap and water, pat dry and place shrinker. Do not soak wound or apply any ointments or lotions. Contact Dr. Lajoyce Corners  if you develop any problems with your incision/wound--redness, swelling, increase in pain, drainage or if you develop fever or chills.    Functional status:  ___ No restrictions     ___ Walk up steps independently ___ 24/7 supervision/assistance   ___ Walk up steps with assistance _X__ Intermittent supervision/assistance  _X__ Bathe/dress independently ___ Walk with walker     ___ Bathe/dress with assistance ___ Walk Independently    ___ Shower independently ___ Walk with assistance    ___ Shower with assistance _X__ No alcohol     ___ Return to work/school ________  Special Instructions:    COMMUNITY REFERRALS UPON DISCHARGE:    Home Health:   PT   OT   RN                Agency:[facility]   Phone:[phone]   Medical Equipment/Items Ordered:WHEELCHAIR, TUB BENCH AND 3 IN 1  WILL GET SHOWER CHAIR FOR STEPS ON OWN AND [name]                                                 Agency/Supplier:[facility]   [phone]    My questions have been answered and I understand these instructions. I will adhere to these goals and the provided educational materials after my discharge from the hospital.  Patient/Caregiver Signature _______________________________ Date __________  Clinician Signature _______________________________________ Date __________  Please bring this form and your medication list with you to all your follow-up doctor's appointments.

## 2022-09-21 NOTE — Progress Notes (Addendum)
PROGRESS NOTE   Subjective/Complaints:  Reports pain controlled this AM. Last BM a little hard, she has started miralax and asks not to changed medications today. She reports she had warm red area on her R thigh although this has improved.   ROS: +constipation. Denies fevers, chills, CP, SOB, abd pain, N/V/D, new/worsening paresthesias/weakness,HA or any other complaints at this time.     Objective:   No results found. Recent Labs    09/19/22 0718 09/21/22 0648  WBC 8.9 10.1  HGB 9.7* 11.1*  HCT 30.8* 33.8*  PLT 251 287    Recent Labs    09/19/22 0718 09/21/22 0648  NA 137 136  K 3.3* 4.3  CL 102 102  CO2 26 22  GLUCOSE 106* 95  BUN 8 15  CREATININE 0.56 0.61  CALCIUM 8.9 9.3     Intake/Output Summary (Last 24 hours) at 09/21/2022 0815 Last data filed at 09/20/2022 1302 Gross per 24 hour  Intake 357 ml  Output --  Net 357 ml         Physical Exam: Vital Signs Blood pressure 118/82, pulse 84, temperature 98 F (36.7 C), temperature source Oral, resp. rate 18, height 5\' 4"  (1.626 m), SpO2 98 %.  Constitutional:      Appearance: Normal appearance.  Obese Edentulous, laying in bed.  Cardiovascular:     Rate and Rhythm: Regular rhythm. Reg rate. No m/r/g Pulm: CTAB, no w/r/r Abdominal:     General: Abdomen is flat.     Palpations: Abdomen is soft. Nontender, nondistended, +BS throughout, no r/g/r.  MSK: Wound vac on R BKA, no output  PRIOR EXAMS: Musculoskeletal:     Comments: Wound VAC in place on R-BKA site -  no current output Full active range of motion of bilateral upper and left lower extremity Right lower extremity status post BKA; limited by pain, 4 out of 5 in hip flexor, knee extensor. Area on inner thigh that is marked with a maker- without significant erythema/warmth at this time.    Neurological:     Mental Status: She is alert and oriented to person, place, and time.  Sensory  deficit in bilateral hands over median nerve distribution, difficulty with fine motor testing bilaterally Reflexes 2+ throughout   Psychiatric:        Mood and Affect: Mood normal.        Behavior: Behavior normal.  Skin: No apparent lesions.  IV site looks clean dry intact.  Wound VAC over BKA distal site  Assessment/Plan: 1. Functional deficits which require 3+ hours per day of interdisciplinary therapy in a comprehensive inpatient rehab setting. Physiatrist is providing close team supervision and 24 hour management of active medical problems listed below. Physiatrist and rehab team continue to assess barriers to discharge/monitor patient progress toward functional and medical goals  Care Tool:  Bathing    Body parts bathed by patient: Right arm, Left arm, Chest, Abdomen, Front perineal area, Buttocks, Right upper leg, Left upper leg, Left lower leg, Face     Body parts n/a: Right lower leg (R BKA)   Bathing assist Assist Level: Minimal Assistance - Patient > 75%     Upper Body  Dressing/Undressing Upper body dressing   What is the patient wearing?: Pull over shirt    Upper body assist Assist Level: Set up assist    Lower Body Dressing/Undressing Lower body dressing      What is the patient wearing?: Pants, Orthosis, Ace wrap/stump shrinker     Lower body assist Assist for lower body dressing: Maximal Assistance - Patient 25 - 49%     Toileting Toileting    Toileting assist Assist for toileting: Minimal Assistance - Patient > 75%     Transfers Chair/bed transfer  Transfers assist     Chair/bed transfer assist level: Contact Guard/Touching assist     Locomotion Ambulation   Ambulation assist      Assist level: Contact Guard/Touching assist Assistive device: Walker-rolling Max distance: 10'   Walk 10 feet activity   Assist     Assist level: Contact Guard/Touching assist Assistive device: Walker-rolling   Walk 50 feet activity   Assist  Walk 50 feet with 2 turns activity did not occur: Safety/medical concerns         Walk 150 feet activity   Assist Walk 150 feet activity did not occur: Safety/medical concerns         Walk 10 feet on uneven surface  activity   Assist Walk 10 feet on uneven surfaces activity did not occur: Safety/medical concerns         Wheelchair     Assist Is the patient using a wheelchair?: Yes Type of Wheelchair: Manual    Wheelchair assist level: Supervision/Verbal cueing Max wheelchair distance: 150'    Wheelchair 50 feet with 2 turns activity    Assist        Assist Level: Supervision/Verbal cueing   Wheelchair 150 feet activity     Assist      Assist Level: Supervision/Verbal cueing   Blood pressure 118/82, pulse 84, temperature 98 F (36.7 C), temperature source Oral, resp. rate 18, height 5\' 4"  (1.626 m), SpO2 98 %.  Medical Problem List and Plan: 1. Functional deficits secondary to right BKA             -patient may not shower until wound vac is removed             -ELOS/Goals: 9-12 days, Mod I PT/OT  -Continue CIR  -- Poor tolerance of limb guard d/t cutting into thigh; OK to remove in bed, reminded to keep knee extended -Area of redness outlined by nursing yesterday appears improved, continue to monitor   2.  Antithrombotics: -DVT/anticoagulation:  Pharmaceutical: Lovenox 40mg  QD             -antiplatelet therapy: ASA 325mg  QD 3. Pain Management: Oxycodone 10-15 mg prn for pain--discussed transition and use oral narcotics --change robaxin to flexeril 5mg  TID scheduled and prn doses as having many different issues.  4. Mood/Behavior/Sleep: LCSW to follow for evaluation and support.              -antipsychotic agents: Vraylar 1.5mg  daily  -Trazodone PRN 5. Neuropsych/cognition: This patient is capable of making decisions on her own behalf. 6. Skin/Wound Care: Routine pressure relief measures.  7. Fluids/Electrolytes/Nutrition: Monitor I/O.  Check CMET in am             --continue Juven, Vitamin C and Zinc to promote wound healing.  8. HTN: Monitor BP TID--on Lisinopril 40mg  daily  -5/13 controlled, continue to monitor Vitals:   09/18/22 1622 09/18/22 1943 09/19/22 0504 09/19/22 1259  BP: 114/63 (!) 109/58  109/62 112/67   09/19/22 1937 09/20/22 0434 09/20/22 1308 09/20/22 1930  BP: 123/72 124/66 119/71 105/68   09/21/22 0306  BP: 118/82    9. Neuropathy/chronic pain/CTS: On Gabapentin 300 mg TID with Cymbalta 90 mg/day             -- Consider addition of bilateral UE neutral wrist splints for CTS 10. H/o Bipolar d/o: Stable on Wellburtrin 300 mg w/Vraylar 1.5 mg             --on provigil 100mg  QD for activation 11. Hypokalemia: Will supplement X 6 doses. Check in am and on Monday.   -09/19/22 K 3.3, continue supplementation of BID for now  5/13 K_up to 4.3 today, start daily, recheck wednesday 12. H/o CALR +Leucocytosis: Has had rise from 4.4-->11.1 but likely baseline.  --Intermittently ranges from normal to 17.1 due to CALR+ and nicotine use per Hem/Onc --Monitor for fevers or other signs of infection.  -09/19/22 WBC 8.9, monitor on routine labs  5/13 WBC stable at 10.1 13. Mild OSA--no treatment  needed per last study 07/2020. 14. H/o Gastritis/Empiric dilatation: GI symptoms managed with Protonix 40mg  BID 15. Constipation:  -09/19/22 small hard BMs yesterday, increase colace to 100mg  BID, has PRNs as well, monitor -09/20/22 still with harder BMs, will try miralax today if persistent, advised we can schedule that if she desires (rather than just PRN); monitor 09/20/22 continue miralax (prn for now per patient request)    LOS: 3 days A FACE TO FACE EVALUATION WAS PERFORMED  Fanny Dance 09/21/2022, 8:15 AM
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
## 2022-09-21 NOTE — Progress Notes (Signed)
Physical Therapy Session Note  Patient Details  Name: Taylor [name] MRN: [id number] Date of Birth: [birth year]  Today's Date: 09/21/2022 PT Individual Time: 0900-1004, 9528-4132 PT Individual Time Calculation (min): 64 min, 68 min   Short Term Goals: Week 1:  PT Short Term Goal 1 (Week 1): STG = LTG due to ELOS  Skilled Therapeutic Interventions/Progress Updates:      Treatment Session 1  Pt seated EOB upon arrival. Pt requesting to perform sponge bath. Pt performed bathed upperbody and lower body leaning side to sidewhile sitting EOB with supervision.   Pt donned shirt with mod I, with required min A for donning bra. Pt donned pants while sitting EOB with min A for feeding wound vac.  Therapist messaged scott from [employer] for large limb protector. He reports to go larger than size large it would need to be custom. Therapist assessed sizing, and it fits on pt, pt reports it often fall off with standing. Therapist assessed blister x3  on posterior thigh. Pt reports she initially did not realize she was not supposed to be sleeping in the limb protector, and feels the blister was created when she was sleeping.   Therapist covered 3 blisters on back of thigh, and covered superior portion of limb protector with soft strap attached to velcro to reduce friction on posterior aspect of residual limb. Therapist plans to assess next session.   Pt donned limb protector with min A to ensure proper placement/fit.   Pt performed sit to stand with supervision, verbal cuing provided for hand placement. Pt performed stand pivot transfer with RW and supervision, with therapist managing wound vac.   Pt self propelled WC with B UE from room to main gym with mod I.   Pt ambulated 35 feet with RW and CGA, with seated rest break due to fatigue.   Pt performed stand pivot transfer WC<>mat table  with RW and supervision to mat table, with verbal cuing provided for R UE on RW and L UE on mat table for sit <>  stand.   Pt performed sit<>supine  independently.   Pt performed 1x5 bridging, pt required rest breaks 2/2 L LE spasms.   Pt performed B sidelying hip abduction 1x10.   Pt performed stand pivot transfer with supervision, as reminder for hand placement.   Pt seated in WC at end of session with seatbelt alarm on, caseworker in room and all needs within reach.     Treatment Session 2  Pt asleep upon arrival. Pt awoken and once alert agreeable to therapy. Pt performed supine to sit independently. Pt attempted to donn limb protector sitting EOB, but transitioned to long sitting for improved ease, pt donned with supervision.   Therapist followed up with pt regarding blisters on posterior aspect of R thigh, pt denies any pain from limb guard, but reports she did not wear it very long after session as she went to sleep. Pt denies any increased irriation at end of session.   Pt performed stand pivot transfer with RW and supervision, verbal cues provided for UE positioning.   Pt donned leg rests with increased time. Pt transported dependent in [facility] to room for time/energy conservation.   Pt ascended/descended 12 steps with L ascending handrail and CGA using shower chair technique with therapist maneuvering shower chair.   Pt performed stand pivot transfer from shower chair to wheelchair with RW and supervision.   Pt reports 7/10 phantom pain in R big toe after stair navigation, nurse present  to give meds. Therapist performed seated activity for remainder of session 2/2 pain/fatigue.   Education provided for desensitization techniques, pt recalled technique and performed desensitization on anterior, medial, lateral, and posterior aspects of residual limb on top of shrinker/woundvac. Education provided to perform daily while in room.   Pt self propelled wheelchair from main gym to room with mod I with B UE. Pt doffed leg rests with supervision, cuing provided for technique as pt has decreased  sensation in finger tips 2/2 B carpal tunnel. Pt positioned wheelchair at 90 deg angle to bed, with supervision, verbal cuing provided for positioning at head of bed but leaving space for RW. Pt performed stand pivot transfer with RW with supervision, with therapist emphasizing UE placement.   Pt sitting EOB with bed alarm on and needs within reach at end of session. Therapist assisted to elevated residual limb on chair infront of pt but pt refuse 2/2 increased nerve pain.    Therapy Documentation Precautions:  Precautions Precautions: Fall Precaution Comments: new R BKA Required Braces or Orthoses: Other Brace Other Brace: limb protector Restrictions Weight Bearing Restrictions: Yes RLE Weight Bearing: Non weight bearing    Therapy/Group: Individual Therapy  Ambrose Finland 09/21/2022, 7:53 AM

## 2022-09-21 NOTE — Progress Notes (Signed)
Inpatient Rehabilitation  Patient information reviewed and entered into eRehab system by Kenishia Plack M. Adabella Stanis, M.A., CCC/SLP, PPS Coordinator.  Information including medical coding, functional ability and quality indicators will be reviewed and updated through discharge.    

## 2022-09-21 NOTE — IPOC Note (Signed)
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, right, sequela (HCC)     Functional Problem List: Nursing Bowel, Safety, Endurance, Medication Management, Pain  PT Balance, Endurance, Pain, Sensory  OT Balance, Edema, Endurance, Motor, Pain, Sensory, Skin Integrity  SLP    TR         Basic ADL's: OT Bathing, Dressing, Toileting     Advanced  ADL's: OT Simple Meal Preparation     Transfers: PT Bed Mobility, Bed to Chair, Car  OT Toilet, Tub/Shower     Locomotion: PT Ambulation, Psychologist, prison and probation services, Stairs     Additional Impairments: OT None  SLP        TR      Anticipated Outcomes Item Anticipated Outcome  Self Feeding Independent  Swallowing      Basic self-care  Mod I  Toileting  Mod I   Bathroom Transfers Mod I  Bowel/Bladder  manage bowel w mod I assist  Transfers  modI  Locomotion  modI WC mobility and short distance gait  Communication     Cognition     Pain  < 4 with prns  Safety/Judgment  manage safety w cues   Therapy Plan: PT Intensity: Minimum of 1-2 x/day ,45 to 90 minutes PT Frequency: 5 out of 7 days PT Duration Estimated Length of Stay: 7-10 OT Intensity: Minimum of 1-2 x/day, 45 to 90 minutes OT Frequency: 5 out of 7 days OT Duration/Estimated Length of Stay: 7-10 days     Team Interventions: Nursing Interventions Bowel Management, Medication Management, Discharge Planning, Disease Management/Prevention, Pain Management, Patient/Family Education  PT interventions Ambulation/gait training, DME/adaptive equipment instruction, Community reintegration, Neuromuscular re-education, Museum/gallery curator, Psychosocial support, UE/LE Strength taining/ROM, Wheelchair propulsion/positioning,  Warden/ranger, Discharge planning, Pain management, Therapeutic Activities, UE/LE Coordination activities, Disease management/prevention, Functional mobility training, Therapeutic Exercise, Splinting/orthotics, Patient/family education  OT Interventions Balance/vestibular training, Discharge planning, Pain management, Self Care/advanced ADL retraining, UE/LE Coordination activities, Therapeutic Activities, Disease mangement/prevention, Functional mobility training, Patient/family education, Skin care/wound managment, Therapeutic Exercise, Community reintegration, Fish farm manager, Neuromuscular re-education, UE/LE Strength taining/ROM, Wheelchair propulsion/positioning  SLP Interventions    TR Interventions    SW/CM Interventions Discharge Planning, Psychosocial Support, Patient/Family Education   Barriers to Discharge MD  Medical stability, Wound care, and Weight bearing restrictions  Nursing Home environment access/layout, Decreased caregiver support 1 level/level entry w friend  PT Inaccessible home environment, Decreased caregiver support full flight of stairs to apartment  OT Inaccessible home environment, Home environment access/layout, Wound Care, Weight bearing restrictions    SLP      SW       Team Discharge Planning: Destination: PT-Home ,OT- Home , SLP-  Projected Follow-up: PT-Home health PT, OT-  Home health OT, SLP-  Projected Equipment Needs: PT-Standard walker, Wheelchair cushion (measurements), Wheelchair (measurements), OT- To be determined, SLP-  Equipment Details: PT- , OT-  Patient/family involved in discharge planning: PT- Patient,  OT-Patient, SLP-   MD ELOS: 9-12 Medical Rehab Prognosis:  Excellent Assessment: The patient has been admitted for CIR therapies with the diagnosis of R BKA. The team will be addressing functional mobility, strength, stamina, balance, safety, adaptive techniques and equipment, self-care, bowel and bladder  mgt, patient and caregiver education. Goals have been set at [facility] I PT/OT. Anticipated discharge destination is home.  See Team Conference Notes for weekly updates to the plan of care

## 2022-09-21 NOTE — Progress Notes (Signed)
Occupational Therapy Session Note  Patient Details  Name: Taylor [name] MRN: [id number] Date of Birth: 1990/11/02  Today's Date: 09/21/2022 OT Individual Time: 1105-1200 OT Individual Time Calculation (min): 55 min    Short Term Goals: Week 1:  OT Short Term Goal 1 (Week 1): STG = LTG due to ELOS  Skilled Therapeutic Interventions/Progress Updates: Patient receive sitting at the EOB. Patient requesting to perform ADL's and work on adapting self care tasks for d/c home. Patient assisted to w/c with min assist using RW for stand pivot/hopping transfer. Followed with grooming tasks set up at the sink. Patient wanting to wash hair at the sink and needed Min/Mod assist with getting hair wet and rinsing hair from the w/c. Continued treatment with fucntional transfers working on transfer using grab bars to pivot to the toilet. Patient able to pivot with CGA and vc's for set up and safety. Patient managed pants without assist. Discussed ways to prevent the pants from falling off her residual limb. Patient able to stand and manage pants without help keeping them hooked at the right knee. Continued treatment session with DME education including ways to use the w/c vs walker at time of discharge such as in the kitchen. Also discussed continued need for a tub bench after being independent with a prosthetic. Patient with good understanding of IADL adaptations and did not have any further concerns or questions at this time. Continue with skilled OT POC to work towards independence with BADL's and IADL's including tasks she will perform when caring for her 49 yo daughter.     Therapy Documentation Precautions:  Precautions Precautions: Fall Precaution Comments: new R BKA Required Braces or Orthoses: Other Brace Other Brace: limb protector Restrictions Weight Bearing Restrictions: Yes RLE Weight Bearing: Non weight bearing    Pain:Patient reports having taken medication for pain prior to OT  session Pain Assessment Pain Score: 0-No pain    Therapy/Group: Individual Therapy  Warnell Forester 09/21/2022, 12:20 PM

## 2022-09-21 NOTE — Progress Notes (Signed)
Patient ID: [name], female   DOB: [birth year], 32 y.o.   MRN: [id number] Met with the patient to review current situation, rehab process, team conference and plan of care. Discussed wound vac and dressing post removal ~10 days post op with shrinker and limb guard. Edema of residual limb noted; encouraged to keep limb elevated and avoid gatch ing at the knee. Reviewed medications and dietary modification recommendations; was changed to a regular diet; encouraged increased calcium and potassium with protein. Plans to discharge home to friend's home with level entry; has DME.  Constipation addressed. Continue to follow along to address educational needs to facilitate preparation for discharge. Pamelia Hoit

## 2022-09-22 DIAGNOSIS — M79604 Pain in right leg: Secondary | ICD-10-CM

## 2022-09-22 DIAGNOSIS — G548 Other nerve root and plexus disorders: Secondary | ICD-10-CM

## 2022-09-22 DIAGNOSIS — I10 Essential (primary) hypertension: Secondary | ICD-10-CM | POA: Diagnosis not present

## 2022-09-22 DIAGNOSIS — M545 Low back pain, unspecified: Secondary | ICD-10-CM

## 2022-09-22 DIAGNOSIS — K5900 Constipation, unspecified: Secondary | ICD-10-CM | POA: Diagnosis not present

## 2022-09-22 DIAGNOSIS — E876 Hypokalemia: Secondary | ICD-10-CM | POA: Diagnosis not present

## 2022-09-22 DIAGNOSIS — S88111S Complete traumatic amputation at level between knee and ankle, right lower leg, sequela: Secondary | ICD-10-CM | POA: Diagnosis not present

## 2022-09-22 MED ORDER — SORBITOL 70 % SOLN: 30.0000 mL | Freq: Every day | Status: DC | PRN

## 2022-09-22 MED ORDER — LISINOPRIL 20 MG PO TABS
20.0000 mg | ORAL_TABLET | Freq: Every day | ORAL | Status: DC
  Administered 2022-09-23 – 2022-09-28 (×6): 20 mg via ORAL
  Filled 2022-09-22 (×6): qty 1

## 2022-09-22 MED ORDER — OXYCODONE HCL 5 MG PO TABS
5.0000 mg | ORAL_TABLET | Freq: Once | ORAL | Status: AC
  Administered 2022-09-22: 5 mg via ORAL
  Filled 2022-09-22: qty 1

## 2022-09-22 NOTE — Progress Notes (Signed)
Physical Therapy Session Note  Patient Details  Name: Taylor [name] MRN: [id number] Date of Birth: [birth year]  Today's Date: 09/22/2022 PT Individual Time: 1046-1202 PT Individual Time Calculation (min): 76 min   Short Term Goals: Week 1:  PT Short Term Goal 1 (Week 1): STG = LTG due to ELOS  Skilled Therapeutic Interventions/Progress Updates:     Pt supine in bed upon arrival. Pt agreeable to therapy. Pt reporting 6/10 R LE pain, premedicated. Therapist provided seated rest breaks as needed during session, and repositioning. Therapist assessed blisters on R thigh. Pt reports bandaging keeps falling off. Pt donned bandaging and wrapped with gauze. Therapist removed soft strap on limb protector as it is not staying in place  Treatment session focused on increasing BUE/LE strength, and balance.   Pt performed supine to sit independently. Pt donned/doffed limb protector with min A, as pt demos difficulty undoing straps due to carpal tunnel syndrome Therapist applied blue tape on ends of straps as visual indicator to pt to where to pull. Pt then donned/doffed with supervision in long sitting.   Pt performed stand pivot transfer with RW and supervision. Pt demos improved recall of hand placement today.   Pt donned leg rests independently, pt self propelled WC from room to main gym with mod I for UE strengthening.   Pt positioned wheelchair for stand pivot transfer with supervision. Pt doffed leg rest independently. Pt performed stand pivot transfer WC to mat table with supervision.   Pt stood with RW and placed 1x10 horseshoes on cone positioned in front of pt with R UE, pt stood and took off 1x10 horseshoes with L hand., pt performed with supervisision, pt demos no LOB.   Pt stood clapped with both hands 1x10 with use of B UE on RW between each rep and CGA.   Pt stood with RW and matched go fish cards with B UE and CGA, pt demos good safety awareness and pt utilized intermittent UE use on  RW as needed to steady pt.   Pt stood with RW and performed boxing with B UE 2x10  with CGA and pt utilzing intermittent UE support on RW to steady self.   Pt reports lightheadedness/dizziness during boxing. Therapist assessed vitals with pt in sitting BP 107/69, HR 87; standing 112/75, HR 98; pt denies any dizziness/lightheadedness after taking seated rest break and drinking water.   Pt requiring minimal cuing for use of UE for sit to stand throughout session 2/2 habit and fatigue. Pt performed 1x10 sit<> stand with L UE on RW and R UE on mat table.   Pt performed 1x5 mini squats with CGA, verbal/tactile cues provided for correct form, pt L LE very shaky, and pt demos L LE buckle on last rep, able to self correct.   Pt performed 1x5 R LE SLR, pt reporting significant increase in pain. Discontinued activity.   Pt requesting to use bathroom. Pt performed stand pivot transfer to [facility]. Pt continent of bladder (documented in flowsheets). Pt performed stand pivot transfer BSC to bed  with RW and supervision, with therapist navigating wound vac. Pt sitting EOB at end of session with bed alarm on and needs within reach. Education provided for positioning R LE in knee extension to reduce risk of knee flexion contracture, pt verbalized understanding but opted to sit EOB with R LE in dependent position.     Therapy Documentation Precautions:   Precautions Precautions: Fall Precaution Comments: new R BKA Required Braces or Orthoses: Other Brace  Other Brace: limb protector Restrictions Weight Bearing Restrictions: Yes RLE Weight Bearing: Non weight bearing   Therapy/Group: Individual Therapy  [facility] [place], [place], DPT  09/22/2022, 7:52 AM

## 2022-09-22 NOTE — Progress Notes (Signed)
Physical Therapy Session Note  Patient Details  Name: Taylor [name] MRN: [id number] Date of Birth: 06/21/1990  Today's Date: 09/22/2022 PT Individual Time: 0910-1005 PT Individual Time Calculation (min): 55 min   Short Term Goals: Week 1:  PT Short Term Goal 1 (Week 1): STG = LTG due to ELOS  Skilled Therapeutic Interventions/Progress Updates:    Chart reviewed and pt agreeable to therapy. Pt received seated EOB with 4/10 c/o pain at surgical site. Session focused on functional transfers, UE strengthening, amb endurance and RLE strengthening to promote pt independence for home access. Pt donned limb guard with set up assist. Pt initiated session with amb to WC using CGA + RW. Pt then completed 153ft WC propulsion with supervision. In gym, pt completed 6 min fwd and 6 min bkwd at UEB at level 1. Pt then amb 3x75 feet with CGA + RW progressing to supervision + RW. Pt then completed 3x10 hip ext in standing with supervision + RW. In room, pt amb to EOB with supervision + RW. At end of session, pt was supine in bed with alarm engaged, nurse call bell and all needs in reach.     Therapy Documentation Precautions:  Precautions Precautions: Fall Precaution Comments: new R BKA Required Braces or Orthoses: Other Brace Other Brace: limb protector Restrictions Weight Bearing Restrictions: Yes RLE Weight Bearing: Non weight bearing   Therapy/Group: Individual Therapy  Dionne Milo, PT, DPT 09/22/2022, 12:11 PM

## 2022-09-22 NOTE — Progress Notes (Signed)
Occupational Therapy Session Note  Patient Details  Name: Taylor [name] MRN: [id number] Date of Birth: [birth year]  Today's Date: 09/22/2022 OT Individual Time: 1410-1435 OT Individual Time Calculation (min): 25 min    Short Term Goals: Week 1:  OT Short Term Goal 1 (Week 1): STG = LTG due to ELOS  Skilled Therapeutic Interventions/Progress Updates:    1:1 Pt in deep sleep when arrived and startled to awake. RN gave scheduled meds. Pt reports having a rough night last night with her pain not being manageable and was not able to sleep. Pt very tired and tearful this afternoon.  PT mod I with bed mobility and was able to ambulate to the bathroom with RW with close supervision and perform toileting with supervision with extra time and setup. Discussed and problem solve home setup and dme recommendations. Pt returned to bed and return to sleep. Wants to do therapy but reports so tired. Reassured pt it was okay and we would resume tomorrow.   Therapy Documentation Precautions:  Precautions Precautions: Fall Precaution Comments: new R BKA Required Braces or Orthoses: Other Brace Other Brace: limb protector Restrictions Weight Bearing Restrictions: Yes RLE Weight Bearing: Non weight bearing General: General OT Amount of Missed Time: 35 Minutes due to fatigue   Pain: Pain Assessment Pain Score: 2     Therapy/Group: Individual Therapy  Roney Mans [facility] 09/22/2022, 3:15 PM

## 2022-09-22 NOTE — Progress Notes (Signed)
PROGRESS NOTE   Subjective/Complaints:  Taylor [name] reports she tries to hold off on pain medication last night.  This morning her pain was very severe especially around her back and neck.  She was also having severe pain in her residual limb site.  She was given additional 5 mg oxycodone this morning.  Appears that she was able to complete her therapy sessions after this.  Patient reports she has history of chronic pain in her back and neck and was previously followed by pain management.  She feels like she slept did not poor position last night.  She says her prior pain management clinic close down.  ROS: +constipation-mild. Denies fevers, chills, CP, SOB, abd pain, N/V/D, new/worsening paresthesias/weakness,HA or any other complaints at this time.  + Back pain, neck pain, residual limb pain and phantom pain    Objective:   No results found. Recent Labs    09/21/22 0648  WBC 10.1  HGB 11.1*  HCT 33.8*  PLT 287    Recent Labs    09/21/22 0648  NA 136  K 4.3  CL 102  CO2 22  GLUCOSE 95  BUN 15  CREATININE 0.61  CALCIUM 9.3     Intake/Output Summary (Last 24 hours) at 09/22/2022 0836 Last data filed at 09/21/2022 1817 Gross per 24 hour  Intake 600 ml  Output --  Net 600 ml         Physical Exam: Vital Signs Blood pressure 105/72, pulse 85, temperature 98 F (36.7 C), resp. rate 18, height 5\' 4"  (1.626 m), SpO2 95 %.  Constitutional:      Appearance: Normal appearance.  Obese Edentulous, laying in bed.  Appears uncomfortable Cardiovascular:     Rate and Rhythm: Regular rhythm. Reg rate. No m/r/g Pulm: CTAB, no w/r/r Abdominal:     General: Abdomen is flat.     Palpations: Abdomen is soft. Nontender, nondistended, +BS throughout, no r/g/r.  MSK: Wound vac on R BKA, no output  PRIOR EXAMS: Musculoskeletal:     Comments: Wound VAC in place on R-BKA site -  no current output Full active range of motion  of bilateral upper and left lower extremity Right lower extremity status post BKA; limited by pain, 4 out of 5 in hip flexor, knee extensor. Area on inner thigh that is marked with a maker-no significant erythema or warmth noted today either   Neurological:     Mental Status: She is alert and oriented to person, place, and time.  Sensory deficit in bilateral hands over median nerve distribution, difficulty with fine motor testing bilaterally Reflexes 2+ throughout   Psychiatric:        Mood and Affect: Mood normal.        Behavior: Behavior normal.  Skin: No apparent lesions.  IV site looks clean dry intact.  Wound VAC over BKA distal site  Assessment/Plan: 1. Functional deficits which require 3+ hours per day of interdisciplinary therapy in a comprehensive inpatient rehab setting. Physiatrist is providing close team supervision and 24 hour management of active medical problems listed below. Physiatrist and rehab team continue to assess barriers to discharge/monitor patient progress toward functional and medical goals  Care Tool:  Bathing    Body parts bathed by patient: Right arm, Left arm, Chest, Abdomen, Front perineal area, Buttocks, Right upper leg, Left upper leg, Left lower leg, Face     Body parts n/a: Right lower leg (R BKA)   Bathing assist Assist Level: Minimal Assistance - Patient > 75%     Upper Body Dressing/Undressing Upper body dressing   What is the patient wearing?: Pull over shirt    Upper body assist Assist Level: Set up assist    Lower Body Dressing/Undressing Lower body dressing      What is the patient wearing?: Pants, Orthosis, Ace wrap/stump shrinker     Lower body assist Assist for lower body dressing: Maximal Assistance - Patient 25 - 49%     Toileting Toileting    Toileting assist Assist for toileting: Contact Guard/Touching assist     Transfers Chair/bed transfer  Transfers assist     Chair/bed transfer assist level: Contact  Guard/Touching assist     Locomotion Ambulation   Ambulation assist      Assist level: Contact Guard/Touching assist Assistive device: Walker-rolling Max distance: 10'   Walk 10 feet activity   Assist     Assist level: Contact Guard/Touching assist Assistive device: Walker-rolling   Walk 50 feet activity   Assist Walk 50 feet with 2 turns activity did not occur: Safety/medical concerns         Walk 150 feet activity   Assist Walk 150 feet activity did not occur: Safety/medical concerns         Walk 10 feet on uneven surface  activity   Assist Walk 10 feet on uneven surfaces activity did not occur: Safety/medical concerns         Wheelchair     Assist Is the patient using a wheelchair?: Yes Type of Wheelchair: Manual    Wheelchair assist level: Supervision/Verbal cueing Max wheelchair distance: 150'    Wheelchair 50 feet with 2 turns activity    Assist        Assist Level: Supervision/Verbal cueing   Wheelchair 150 feet activity     Assist      Assist Level: Supervision/Verbal cueing   Blood pressure 105/72, pulse 85, temperature 98 F (36.7 C), resp. rate 18, height 5\' 4"  (1.626 m), SpO2 95 %.  Medical Problem List and Plan: 1. Functional deficits secondary to right BKA             -patient may not shower until wound vac is removed             -ELOS/Goals: 9-12 days, Mod I PT/OT  -Continue CIR  -- Poor tolerance of limb guard d/t cutting into thigh; OK to remove in bed, reminded to keep knee extended -Area of redness outlined by nursing yesterday appears improved, continue to monitor -Team conference tomorrow.  She ambulated 3 x 75 feet with rolling walker today with therapy   2.  Antithrombotics: -DVT/anticoagulation:  Pharmaceutical: Lovenox 40mg  QD             -antiplatelet therapy: ASA 325mg  QD 3. Pain Management: Oxycodone 10-15 mg prn for pain--discussed transition and use oral narcotics --change robaxin to  flexeril 5mg  TID scheduled and prn doses as having many different issues.   -5/14 additional 5 mg oxycodone ordered this morning.  She reports back pain, neck pain and residual limb pain.  Consider addition of gabapentin, Lyrica or duloxetine to help with phantom pain.  Will discuss further with patient later  this week 4. Mood/Behavior/Sleep: LCSW to follow for evaluation and support.              -antipsychotic agents: Vraylar 1.5mg  daily  -Trazodone PRN 5. Neuropsych/cognition: This patient is capable of making decisions on her own behalf. 6. Skin/Wound Care: Routine pressure relief measures.  7. Fluids/Electrolytes/Nutrition: Monitor I/O. Check CMET in am             --continue Juven, Vitamin C and Zinc to promote wound healing.  8. HTN: Monitor BP TID--on Lisinopril 40mg  daily  -5/14 BP a little soft at times, will decrease lisinopril to 20 mg daily to prevent hypotension Vitals:   09/18/22 1622 09/18/22 1943 09/19/22 0504 09/19/22 1259  BP: 114/63 (!) 109/58 109/62 112/67   09/19/22 1937 09/20/22 0434 09/20/22 1308 09/20/22 1930  BP: 123/72 124/66 119/71 105/68   09/21/22 0306 09/21/22 1354 09/21/22 1959 09/22/22 0446  BP: 118/82 (!) 95/56 98/65 105/72    9. Neuropathy/chronic pain/CTS: On Gabapentin 300 mg TID with Cymbalta 90 mg/day             -- Consider addition of bilateral UE neutral wrist splints for CTS 10. H/o Bipolar d/o: Stable on Wellburtrin 300 mg w/Vraylar 1.5 mg             --on provigil 100mg  QD for activation 11. Hypokalemia: Will supplement X 6 doses. Check in am and on Monday.   -09/19/22 K 3.3, continue supplementation of BID for now  5/13 K_up to 4.3 today, start daily, recheck Wednesday  -Recheck level tomorrow 12. H/o CALR +Leucocytosis: Has had rise from 4.4-->11.1 but likely baseline.  --Intermittently ranges from normal to 17.1 due to CALR+ and nicotine use per Hem/Onc --Monitor for fevers or other signs of infection.  -09/19/22 WBC 8.9,  monitor on routine labs  5/13 WBC stable at 10.1 13. Mild OSA--no treatment  needed per last study 07/2020. 14. H/o Gastritis/Empiric dilatation: GI symptoms managed with Protonix 40mg  BID 15. Constipation:  -09/19/22 small hard BMs yesterday, increase colace to 100mg  BID, has PRNs as well, monitor -09/20/22 still with harder BMs, will try miralax today if persistent, advised we can schedule that if she desires (rather than just PRN); monitor 09/21/22 continue miralax (prn for now per patient request)   -5/14 last BM 5/12, patient would still like medication as needed although we discussed that I think it be best if she takes this every day.  Will also order sorbitol as needed if she has more severe constipation.  LOS: 4 days A FACE TO FACE EVALUATION WAS PERFORMED  Fanny Dance 09/22/2022, 8:36 AM

## 2022-09-23 DIAGNOSIS — F4312 Post-traumatic stress disorder, chronic: Secondary | ICD-10-CM | POA: Diagnosis not present

## 2022-09-23 DIAGNOSIS — I10 Essential (primary) hypertension: Secondary | ICD-10-CM | POA: Diagnosis not present

## 2022-09-23 DIAGNOSIS — F4001 Agoraphobia with panic disorder: Secondary | ICD-10-CM

## 2022-09-23 DIAGNOSIS — E876 Hypokalemia: Secondary | ICD-10-CM | POA: Diagnosis not present

## 2022-09-23 DIAGNOSIS — F319 Bipolar disorder, unspecified: Secondary | ICD-10-CM

## 2022-09-23 DIAGNOSIS — K5900 Constipation, unspecified: Secondary | ICD-10-CM | POA: Diagnosis not present

## 2022-09-23 DIAGNOSIS — Z8659 Personal history of other mental and behavioral disorders: Secondary | ICD-10-CM

## 2022-09-23 DIAGNOSIS — S88111S Complete traumatic amputation at level between knee and ankle, right lower leg, sequela: Secondary | ICD-10-CM | POA: Diagnosis not present

## 2022-09-23 LAB — BASIC METABOLIC PANEL
Anion gap: 12 (ref 5–15)
BUN: 11 mg/dL (ref 6–20)
CO2: 23 mmol/L (ref 22–32)
Calcium: 9.8 mg/dL (ref 8.9–10.3)
Chloride: 99 mmol/L (ref 98–111)
Creatinine, Ser: 0.66 mg/dL (ref 0.44–1.00)
GFR, Estimated: >60 mL/min (ref >60)
Glucose, Bld: 121 mg/dL — ABNORMAL HIGH (ref 70–99)
Potassium: 3.8 mmol/L (ref 3.5–5.1)
Sodium: 134 mmol/L — ABNORMAL LOW (ref 135–145)

## 2022-09-23 MED ORDER — GABAPENTIN 400 MG PO CAPS
400.0000 mg | ORAL_CAPSULE | Freq: Three times a day (TID) | ORAL | Status: DC
  Administered 2022-09-23 – 2022-09-25 (×6): 400 mg via ORAL
  Filled 2022-09-23 (×6): qty 1

## 2022-09-23 NOTE — Progress Notes (Signed)
Patient ID: Taylor [name], female   DOB: Jul 10, 1990, 32 y.o.   MRN: [id number]  Met with pt to give team conference update regaridng goals of mod/I wheelchair and target discharge date of 5/20. She voiced she is not taking this wound vac home and would like it discharged. She has expressed this concern to the MD and he wants to wait to see how doing tomorrow. She will have her Dad come in for education since he will be the one assisting. She hopes to go home and not to friends home. She has ordered a tb seat and rolling walker for herself on-line to be delivered tomorrow to home. Will make referral for wheelchair, bedside commode and tub bench. Work on discharge needs. She is trying to hang in there having pain issues and sleep issues.
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
## 2022-09-23 NOTE — Progress Notes (Signed)
PROGRESS NOTE   Subjective/Complaints:  Reports she continues to have residual limb and phantom pain. Oxycodone helping control residual limb pain.  She has had some drainage noted in wound vac this AM.  ROS: +constipation-mild. Denies fevers, chills, CP, SOB, abd pain, N/V/D, new/worsening paresthesias/weakness,HA or any other complaints at this time.  + Back pain, neck pain, residual limb pain and phantom pain    Objective:   No results found. Recent Labs    09/21/22 0648  WBC 10.1  HGB 11.1*  HCT 33.8*  PLT 287    Recent Labs    09/21/22 0648 09/23/22 0756  NA 136 134*  K 4.3 3.8  CL 102 99  CO2 22 23  GLUCOSE 95 121*  BUN 15 11  CREATININE 0.61 0.66  CALCIUM 9.3 9.8     Intake/Output Summary (Last 24 hours) at 09/23/2022 1040 Last data filed at 09/23/2022 0942 Gross per 24 hour  Intake 340 ml  Output 50 ml  Net 290 ml         Physical Exam: Vital Signs Blood pressure 113/85, pulse 100, temperature 98.8 F (37.1 C), resp. rate 19, height 5\' 4"  (1.626 m), SpO2 98 %.  Constitutional:      Appearance: Normal appearance.  Obese Edentulous, laying in bed.  Appears uncomfortable Cardiovascular:     Rate and Rhythm: Regular rhythm. Reg rate. No m/r/g Pulm: CTAB, no w/r/r Abdominal:     General: Abdomen is flat.     Palpations: Abdomen is soft. Nontender, nondistended, +BS throughout, no r/g/r.  MSK: Wound vac on R BKA, no output  PRIOR EXAMS: Musculoskeletal:     Comments: Wound VAC in place on R-BKA site -  small amount of serosanguinous drainage  Full active range of motion of bilateral upper and left lower extremity Right lower extremity status post BKA; limited by pain, 4 out of 5 in hip flexor, knee extensor. Area on inner thigh that is marked with a maker-no significant erythema or warmth noted today either   Neurological:     Mental Status: She is alert and oriented to person, place, and  time.  Sensory deficit in bilateral hands over median nerve distribution, difficulty with fine motor testing bilaterally Reflexes 2+ throughout   Psychiatric:        Mood and Affect: Mood normal.        Behavior: Behavior normal.  Skin: No apparent lesions.  IV site looks clean dry intact.  Wound VAC over BKA distal site  Assessment/Plan: 1. Functional deficits which require 3+ hours per day of interdisciplinary therapy in a comprehensive inpatient rehab setting. Physiatrist is providing close team supervision and 24 hour management of active medical problems listed below. Physiatrist and rehab team continue to assess barriers to discharge/monitor patient progress toward functional and medical goals  Care Tool:  Bathing    Body parts bathed by patient: Right arm, Left arm, Chest, Abdomen, Front perineal area, Buttocks, Right upper leg, Left upper leg, Left lower leg, Face     Body parts n/a: Right lower leg (R BKA)   Bathing assist Assist Level: Minimal Assistance - Patient > 75%     Upper Body Dressing/Undressing  Upper body dressing   What is the patient wearing?: Pull over shirt    Upper body assist Assist Level: Set up assist    Lower Body Dressing/Undressing Lower body dressing      What is the patient wearing?: Pants, Orthosis, Ace wrap/stump shrinker     Lower body assist Assist for lower body dressing: Maximal Assistance - Patient 25 - 49%     Toileting Toileting    Toileting assist Assist for toileting: Contact Guard/Touching assist     Transfers Chair/bed transfer  Transfers assist     Chair/bed transfer assist level: Contact Guard/Touching assist     Locomotion Ambulation   Ambulation assist      Assist level: Contact Guard/Touching assist Assistive device: Walker-rolling Max distance: 10'   Walk 10 feet activity   Assist     Assist level: Contact Guard/Touching assist Assistive device: Walker-rolling   Walk 50 feet  activity   Assist Walk 50 feet with 2 turns activity did not occur: Safety/medical concerns         Walk 150 feet activity   Assist Walk 150 feet activity did not occur: Safety/medical concerns         Walk 10 feet on uneven surface  activity   Assist Walk 10 feet on uneven surfaces activity did not occur: Safety/medical concerns         Wheelchair     Assist Is the patient using a wheelchair?: Yes Type of Wheelchair: Manual    Wheelchair assist level: Supervision/Verbal cueing Max wheelchair distance: 150'    Wheelchair 50 feet with 2 turns activity    Assist        Assist Level: Supervision/Verbal cueing   Wheelchair 150 feet activity     Assist      Assist Level: Supervision/Verbal cueing   Blood pressure 113/85, pulse 100, temperature 98.8 F (37.1 C), resp. rate 19, height 5\' 4"  (1.626 m), SpO2 98 %.  Medical Problem List and Plan: 1. Functional deficits secondary to right BKA             -patient may not shower until wound vac is removed             -ELOS/Goals: 9-12 days, Mod I PT/OT  -Continue CIR  -- Poor tolerance of limb guard d/t cutting into thigh; OK to remove in bed, reminded to keep knee extended -Area of redness outlined by nursing yesterday appears improved, continue to monitor -Team conference today please see physician documentation under team conference tab, met with team  to discuss problems,progress, and goals. Formulized individual treatment plan based on medical history, underlying problem and comorbidities.     2.  Antithrombotics: -DVT/anticoagulation:  Pharmaceutical: Lovenox 40mg  QD             -antiplatelet therapy: ASA 325mg  QD 3. Pain Management: Oxycodone 10-15 mg prn for pain--discussed transition and use oral narcotics --change robaxin to flexeril 5mg  TID scheduled and prn doses as having many different issues.   -5/14 additional 5 mg oxycodone ordered this morning.  She reports back pain, neck pain  and residual limb pain. 5/15 Increase gabapentin to 400mg  TID from 300mg  TID for phantom pain, continue duloxetine. Continue current dose oxycodone for now.  4. Mood/Behavior/Sleep: LCSW to follow for evaluation and support.              -antipsychotic agents: Vraylar 1.5mg  daily  -Trazodone PRN 5. Neuropsych/cognition: This patient is capable of making decisions on her own behalf. 6.  Skin/Wound Care: Routine pressure relief measures.  7. Fluids/Electrolytes/Nutrition: Monitor I/O. Check CMET in am             --continue Juven, Vitamin C and Zinc to promote wound healing.  8. HTN: Monitor BP TID--on Lisinopril 40mg  daily  -5/14 BP a little soft at times, will decrease lisinopril to 20 mg daily to prevent hypotension  -5/15 BP stable, continue to monitor response to medication change Vitals:   09/19/22 1259 09/19/22 1937 09/20/22 0434 09/20/22 1308  BP: 112/67 123/72 124/66 119/71   09/20/22 1930 09/21/22 0306 09/21/22 1354 09/21/22 1959  BP: 105/68 118/82 (!) 95/56 98/65   09/22/22 0446 09/22/22 1320 09/22/22 1952 09/23/22 0328  BP: 105/72 121/71 112/68 113/85    9. Neuropathy/chronic pain/CTS: On Gabapentin 300 mg TID with Cymbalta 90 mg/day             -- Consider addition of bilateral UE neutral wrist splints for CTS 10. H/o Bipolar d/o: Stable on Wellburtrin 300 mg w/Vraylar 1.5 mg             --on provigil 100mg  QD for activation 11. Hypokalemia: Will supplement X 6 doses. Check in am and on Monday.   -09/19/22 K 3.3, continue supplementation of BID for now  5/13 K_up to 4.3 today, start daily, recheck Wednesday  -5/15 K+ stable at 3.8, continue daily supplementation  12. H/o CALR +Leucocytosis: Has had rise from 4.4-->11.1 but likely baseline.  --Intermittently ranges from normal to 17.1 due to CALR+ and nicotine use per Hem/Onc --Monitor for fevers or other signs of infection.  -09/19/22 WBC 8.9, monitor on routine labs  5/13 WBC stable at 10.1 13. Mild  OSA--no treatment  needed per last study 07/2020. 14. H/o Gastritis/Empiric dilatation: GI symptoms managed with Protonix 40mg  BID 15. Constipation:  -09/19/22 small hard BMs yesterday, increase colace to 100mg  BID, has PRNs as well, monitor -09/20/22 still with harder BMs, will try miralax today if persistent, advised we can schedule that if she desires (rather than just PRN); monitor 09/21/22 continue miralax (prn for now per patient request)   -5/14 last BM 5/12, patient would still like medication as needed although we discussed that I think it be best if she takes this every day.  Will also order sorbitol as needed if she has more severe constipation.  -5/15 LBM today, improved, continue current regimen  LOS: 5 days A FACE TO FACE EVALUATION WAS PERFORMED  Fanny Dance 09/23/2022, 10:40 AM

## 2022-09-23 NOTE — Progress Notes (Signed)
Physical Therapy Session Note  Patient Details  Name: Taylor [name] MRN: [id number] Date of Birth: 01-14-1991  Today's Date: 09/23/2022 PT Individual Time: 1500-1525 PT Individual Time Calculation (min): 25 min   Short Term Goals: Week 1:  PT Short Term Goal 1 (Week 1): STG = LTG due to ELOS  Skilled Therapeutic Interventions/Progress Updates:       Pt sitting EOB to start - no reports of pain but does report high fatigue from busy day of therapies. Requests light activity. Pt quite anxious regarding her wound vac - wants it gone before she DC home as she's concerned her toddler will accidentally pull it.   Stand pivot transfer with supervision and RW from EOB to w/c - PT assisting with line management for wound vac. Pt propelled herself mod I in w/c from her room to main rehab gym. Completed seated UE exercises with 6lb med ball - ABC drawing, isometric shoulder extensions 2x30 seconds, and shoulder press 1x15.  Propelled herself back to her room mod I and assisted her back to bed in similar manner as above. Concluded session seated EOB with all needs met.    Therapy Documentation Precautions:  Precautions Precautions: Fall Precaution Comments: new R BKA Required Braces or Orthoses: Other Brace Other Brace: limb protector Restrictions Weight Bearing Restrictions: Yes RLE Weight Bearing: Non weight bearing General:      Therapy/Group: Individual Therapy  Orrin Brigham 09/23/2022, 3:25 PM

## 2022-09-23 NOTE — Progress Notes (Signed)
Physical Therapy Session Note  Patient Details  Name: Taylor [name] MRN: [id number] Date of Birth: 1990-07-19  Today's Date: 09/23/2022 PT Individual Time: 0802-0857 PT Individual Time Calculation (min): 55 min   Short Term Goals: Week 1:  PT Short Term Goal 1 (Week 1): STG = LTG due to ELOS  Skilled Therapeutic Interventions/Progress Updates:     Pt reports having a rough night last night with her incisional, residual limb and back/neck pain not being manageable and was not able to sleep. Pt very tired and tearful this morning. Therapist utilizing therapeutic use of self to provide support and encouragement. Pt report inability to eat dinner or breakfast. Pt agreeable to therapy. Pt reports 6/10 pain, nurse present at beginning of session to administer medications.  Therapist offered to step out to allow pt to eat breakfast, pt initially reports decreased appetite and refused, but towards end of session pt requesting to eat breakfast.   Pt ambulated to bathroom with supervision with RW, with therapist managing wound vac.  Pt doffed pants in standing with supervision. Pt performed stand<>sit with B UE use of grab bars with supervisoin.  Pt continent of bowel and bladder (documented in flowsheets). Pt perfomed pericare while seated independently. Pt performed sit<> stand with use of RW and grab bar with supervision. Pt donned/doffed pants standing with RW and supervision.   Pt ambulated from bathroom to bed with supervision and RW with therapist managing wound vac. Pt changed shirts while sitting EOB with mod I. Pt changed pants with min A for clamping wound vac.   Pt wound vac with drainage today, pt very concerned about injury to incision site 2/2 increased pain and wound vac drainage. Notified nursing.   Discussed team conference today to determine D/C date. Discussed pt family/friend availability to attend family training. Reviewed equipment needed upon discharge: RW, wheelchair, BSC,  and shower chair. Discussed teaching daily skin inspection with handheld mirror and practicing donning/doffing shrinker once wound vac comes off.   Pt sitting EOB at end of session eating breakfast with bed alarm on and needs within reach.      Therapy Documentation Precautions:  Precautions Precautions: Fall Precaution Comments: new R BKA Required Braces or Orthoses: Other Brace Other Brace: limb protector Restrictions Weight Bearing Restrictions: Yes RLE Weight Bearing: Non weight bearing  Therapy/Group: Individual Therapy  [facility] Ambrose [place], [place], DPT  09/23/2022, 7:38 AM

## 2022-09-23 NOTE — Progress Notes (Signed)
Physical Therapy Session Note  Patient Details  Name: Taylor [name] MRN: [id number] Date of Birth: Oct 18, 1990  Today's Date: 09/23/2022 PT Individual Time: 1000-1054 PT Individual Time Calculation (min): 54 min   Short Term Goals: Week 1:  PT Short Term Goal 1 (Week 1): STG = LTG due to ELOS  Skilled Therapeutic Interventions/Progress Updates:      Pt sitting EOB to start - agreeable to therapy. Reports some RLE pain but that it's "OK."   Donned limb guard without assist for RLE. Sit<>stand to RW with supervision from EOB. Stand hop transfer with supervision and RW from EOB to w/c, assist for managing wound vac.  Pt propelled herself mod I in w/c from her room to main rehab gym, ~140ft. Assisted to mat table in similar manner as above.   Completed mat level there-ex:  -RLE hip abd/add 1x15 -RLE hip abd/add with green TB 2x15 -LLE single leg bridge 2x8 -RLE sidelying hip abd with green TB 2x10 -RLE sidelying hip aext with green TB 2x10  Repeated sit<>stands 2x10 from EOB to RW with supervision   Propelled herself 153ft mod I to ortho rehab gym in w/c. Stopped to pet therapy dog, Dixie, for therapeutic calming and improve mood.   In Ortho rehab gym, assisted onto Nustep with supervision and RW. Completed x5 minutes with LLE/BUE at L7 resistance, completing 205 steps total. Maintained NWB for RLE during activity.  Propelled herself mod I back to her room and concluded session seated in w/c. Made her aware of upcoming therapy schedule - pt upset with back-to-back therapy sessions - will relay to scheduler.   Therapy Documentation Precautions:  Precautions Precautions: Fall Precaution Comments: new R BKA Required Braces or Orthoses: Other Brace Other Brace: limb protector Restrictions Weight Bearing Restrictions: Yes RLE Weight Bearing: Non weight bearing General:      Therapy/Group: Individual Therapy  Orrin Brigham 09/23/2022, 10:42 AM
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
## 2022-09-23 NOTE — Progress Notes (Signed)
Occupational Therapy Session Note  Patient Details  Name: Taylor [name] MRN: [id number] Date of Birth: [birth year]  Today's Date: 09/23/2022 OT Individual Time: 1100-1130 OT Individual Time Calculation (min): 30 min    Short Term Goals: Week 1:  OT Short Term Goal 1 (Week 1): STG = LTG due to ELOS  Skilled Therapeutic Interventions/Progress Updates:    Pt sitting up in w/c, stating "I have more therapy? Im just so tired". Pt also reporting feeling overwhelmed with rehab process and strong desire to return home to be with her daughter, but stating she does not want to go home with a wound vac.  Pt educated on resources including peer support program as a coping strategy as well as benefits of meditation techniques.  Pt refusing all strategies reporting she is an introvert and shaking her head no to progressive muscle relaxation/deep breathing. Pt requesting to return to bed due to fatigue and wanting to take limb guard off.   Pt completed stand pivot w/c to EOB using RW with supervision and assist to manage wound vac and then completed sit to supine with mod I.  Call bell in reach, bed alarm on.  Missed 30 minutes of treatment due to fatigue and somewhat unwillingness.  Therapy Documentation Precautions:  Precautions Precautions: Fall Precaution Comments: new R BKA Required Braces or Orthoses: Other Brace Other Brace: limb protector Restrictions Weight Bearing Restrictions: Yes RLE Weight Bearing: Non weight bearing   Therapy/Group: Individual Therapy  Amie Critchley 09/23/2022, 12:34 PM

## 2022-09-24 DIAGNOSIS — M79604 Pain in right leg: Secondary | ICD-10-CM | POA: Diagnosis not present

## 2022-09-24 DIAGNOSIS — K5900 Constipation, unspecified: Secondary | ICD-10-CM | POA: Diagnosis not present

## 2022-09-24 DIAGNOSIS — G5603 Carpal tunnel syndrome, bilateral upper limbs: Secondary | ICD-10-CM

## 2022-09-24 DIAGNOSIS — S88111S Complete traumatic amputation at level between knee and ankle, right lower leg, sequela: Secondary | ICD-10-CM | POA: Diagnosis not present

## 2022-09-24 DIAGNOSIS — I10 Essential (primary) hypertension: Secondary | ICD-10-CM | POA: Diagnosis not present

## 2022-09-24 NOTE — Progress Notes (Signed)
Physical Therapy Session Note  Patient Details  Name: Taylor [name] MRN: [id number] Date of Birth: 07-Oct-1990  Today's Date: 09/24/2022 PT Individual Time: 0802-0901 PT Individual Time Calculation (min): 59 min   Short Term Goals: Week 1:  PT Short Term Goal 1 (Week 1): STG = LTG due to ELOS  Skilled Therapeutic Interventions/Progress Updates:      Pt sitting EOB upon arrival dressed for the day.  Pt agreeable to therapy. Pt denies any pain. Pt reports she slept well last night, and ultimately feeling better.   Discussed discharge date set for 5/20. Discussed pt preference for family training on 5/18 2-3 pm. . Pt expressed only wanting to do family training with PT to practice stairs, as pt family will only be able to attend for an hour and pt has difficulty participating in back to back therapy with no break. IDT notified.   Pt donned limb protector with supervision, verbal cues provided to make sure all necessary parts are in proper position. Pt ambulated from bed to sink with RW and hop to gait pattern with supervision with therapist managing wound vac. Pt stood at kitchen sink with supervision to brush teeth. Pt ambualted from sink to bed with RW and hop to gait with supervision.   Pt equipment has been delivered to room including shower chair, BSC, wheelchair. Pt reports she has ordered a RW,  another shower chair for stair navigation, as well as a reacher and skin inspection mirror and it will be delivered to house.   Pt performed stand pivot transfer from bed <> new wheelchair. Therapist adjusted cushion, back rest cushion, and leg rests for proper fit. Pt donned new leg rests with min A. Pt self propelled WC from room to ortho gym with mod I.   Pt ascended/descended ramp x2 with CGA for ascending and supervision for descending. Pt trialed ascending forward on first trial, and backwards on 2nd trial. Pt fatigued with both methods, but demos preference for ascending forwards.   Pt  requesting alternative method of transfer bed <> WC without RW in instance she leaves RW next to bathroom (as bathroom is not wheelchair accessible). Therapist provided demo of squat pivot transfer. Pt performed squat pivot transfer WC <> mat table with CGA first trial and supervision 2nd trial.   Pt transported dependent in WC to room. Pt performed stand pivot transfer to bed with RW and sup. Pt seated EOB at end of session with all needs within reach and bed alarm on.   Therapy Documentation Precautions:  Precautions Precautions: Fall Precaution Comments: new R BKA Required Braces or Orthoses: Other Brace Other Brace: limb protector Restrictions Weight Bearing Restrictions: Yes RLE Weight Bearing: Non weight bearing Therapy/Group: Individual Therapy  [facility] [place], [place], DPT  09/24/2022, 7:29 AM

## 2022-09-24 NOTE — Progress Notes (Signed)
Occupational Therapy Session Note  Patient Details  Name: NOBIA WOLTHUIS MRN: [id number] Date of Birth: 12/28/1990  Today's Date: 09/24/2022 OT Individual Time: 1017-1100 OT Individual Time Calculation (min): 43 min    Short Term Goals: Week 1:  OT Short Term Goal 1 (Week 1): STG = LTG due to ELOS  Skilled Therapeutic Interventions/Progress Updates: Patient sitting on the edge of bed.  Agreeable to OT treatment. Patient wanting to work on stretching and stability tasks that she can carry over to home. Patient given simple UE stretches to reduce discomfort to shoulder and triceps with increased w/c use and hopping with the RW. Followed with supine hamstring stretches utilizing gate belt to maintain neutral spine while bringing LLE into hip flexion. Followed with adduction a cross midline. Patient reports cramping with transfers focused mainly in the left hamstring. Also discussed the importance of hip stability exercises for BLE's to ensure a faster transition back up onto the prosthetic when the LE heals.  Patient reports feeling good about self care from w/c and IADL performance from the w/c as she has practiced these techniques in the past due to multiple ankle surgeries. 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     Therapy Documentation Precautions:  Precautions Precautions: Fall Precaution Comments: new R BKA Required Braces or Orthoses: Other Brace Other Brace: limb protector Restrictions Weight Bearing Restrictions: Yes RLE Weight Bearing: Non weight bearing   Pain: Pain Assessment Pain Scale: 0-10 Pain Score: 5  Pain Type: Surgical pain Pain Location: Leg Pain Orientation: Right Pain Descriptors / Indicators: Sharp;Shooting Pain Frequency: Constant Pain Onset: Progressive Pain Intervention(s): Medication (See eMAR)    Therapy/Group: Individual Therapy  Warnell Forester 09/24/2022, 12:24 PM

## 2022-09-24 NOTE — Progress Notes (Signed)
PROGRESS NOTE   Subjective/Complaints:  Working with therapy this morning in the gym.  Reports pain is doing better overall today.  Wound VAC has not had much output today.  ROS: +constipation-mild. Denies fevers, chills, CP, SOB, abd pain, N/V/D, new/worsening paresthesias/weakness,HA or any other complaints at this time.  + Back pain, neck pain, residual limb pain and phantom pain + Numbness in hands chronic-reports history of carpal tunnel syndrome   Objective:   No results found. No results for input(s): "WBC", "HGB", "HCT", "PLT" in the last 72 hours.  Recent Labs    09/23/22 0756  NA 134*  K 3.8  CL 99  CO2 23  GLUCOSE 121*  BUN 11  CREATININE 0.66  CALCIUM 9.8     Intake/Output Summary (Last 24 hours) at 09/24/2022 1653 Last data filed at 09/24/2022 1250 Gross per 24 hour  Intake 1300 ml  Output --  Net 1300 ml         Physical Exam: Vital Signs Blood pressure 115/80, pulse (!) 104, temperature 98.3 F (36.8 C), temperature source Oral, resp. rate 18, height 5\' 4"  (1.626 m), SpO2 98 %.  Constitutional:      Appearance: Normal appearance.  Obese Edentulous, laying in bed.  Appears uncomfortable Cardiovascular:     Rate and Rhythm: Regular rhythm. Reg rate. No m/r/g Pulm: CTAB, no w/r/r Abdominal:     General: Abdomen is flat.     Palpations: Abdomen is soft. Nontender, nondistended, +BS throughout, no r/g/r.  MSK: Wound vac on R BKA  PRIOR EXAMS: Musculoskeletal:     Comments: Wound VAC in place on R-BKA site -  small amount of serosanguinous drainage -appears unchanged amount since yesterday Full active range of motion of bilateral upper and left lower extremity Right lower extremity status post BKA; limited by pain, 4 out of 5 in hip flexor, knee extensor. Area on inner thigh that is marked with a maker-no significant erythema or warmth noted   Neurological:     Mental Status: She is alert  and oriented to person, place, and time.  Sensory deficit in bilateral hands over median nerve distribution, difficulty with fine motor testing bilaterally, Tinel's mildly positive Reflexes 2+ throughout   Psychiatric:        Mood and Affect: Mood normal.        Behavior: Behavior normal.  Skin: No apparent lesions.  IV site looks clean dry intact.  Wound VAC over BKA distal site  Assessment/Plan: 1. Functional deficits which require 3+ hours per day of interdisciplinary therapy in a comprehensive inpatient rehab setting. Physiatrist is providing close team supervision and 24 hour management of active medical problems listed below. Physiatrist and rehab team continue to assess barriers to discharge/monitor patient progress toward functional and medical goals  Care Tool:  Bathing    Body parts bathed by patient: Right arm, Left arm, Chest, Abdomen, Front perineal area, Buttocks, Right upper leg, Left upper leg, Left lower leg, Face     Body parts n/a: Right lower leg (R BKA)   Bathing assist Assist Level: Minimal Assistance - Patient > 75%     Upper Body Dressing/Undressing Upper body dressing   What  is the patient wearing?: Pull over shirt    Upper body assist Assist Level: Set up assist    Lower Body Dressing/Undressing Lower body dressing      What is the patient wearing?: Pants, Orthosis, Ace wrap/stump shrinker     Lower body assist Assist for lower body dressing: Maximal Assistance - Patient 25 - 49%     Toileting Toileting    Toileting assist Assist for toileting: Contact Guard/Touching assist     Transfers Chair/bed transfer  Transfers assist     Chair/bed transfer assist level: Supervision/Verbal cueing (stand<>pivot without RW)     Locomotion Ambulation   Ambulation assist      Assist level: Supervision/Verbal cueing Assistive device: Walker-rolling Max distance: 70ft   Walk 10 feet activity   Assist     Assist level:  Supervision/Verbal cueing Assistive device: Walker-rolling   Walk 50 feet activity   Assist Walk 50 feet with 2 turns activity did not occur: Safety/medical concerns         Walk 150 feet activity   Assist Walk 150 feet activity did not occur: Safety/medical concerns         Walk 10 feet on uneven surface  activity   Assist Walk 10 feet on uneven surfaces activity did not occur: Safety/medical concerns         Wheelchair     Assist Is the patient using a wheelchair?: Yes Type of Wheelchair: Manual    Wheelchair assist level: Supervision/Verbal cueing Max wheelchair distance: 150'    Wheelchair 50 feet with 2 turns activity    Assist        Assist Level: Supervision/Verbal cueing   Wheelchair 150 feet activity     Assist      Assist Level: Supervision/Verbal cueing   Blood pressure 115/80, pulse (!) 104, temperature 98.3 F (36.8 C), temperature source Oral, resp. rate 18, height 5\' 4"  (1.626 m), SpO2 98 %.  Medical Problem List and Plan: 1. 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  Antithrombotics: -DVT/anticoagulation:  Pharmaceutical: Lovenox 40mg  QD             -antiplatelet therapy: ASA 325mg  QD 3. Pain Management: Oxycodone 10-15 mg prn for pain--discussed transition and use oral narcotics --change robaxin to flexeril 5mg  TID scheduled and prn doses as having many different issues.   -5/14 additional 5 mg oxycodone ordered this morning.  She reports back pain, neck pain and residual limb pain. 5/15 Increase gabapentin to 400mg  TID from 300mg  TID for phantom pain, continue  duloxetine. Continue current dose oxycodone for now.   5/16 pain improved today continue current regimen 4. Mood/Behavior/Sleep: LCSW to follow for evaluation and support.              -antipsychotic agents: Vraylar 1.5mg  daily  -Trazodone PRN 5. Neuropsych/cognition: This patient is capable of making decisions on her own behalf. 6. Skin/Wound Care: Routine pressure relief measures.  7. Fluids/Electrolytes/Nutrition: Monitor I/O. Check CMET in am             --  continue Juven, Vitamin C and Zinc to promote wound healing.  8. HTN: Monitor BP TID--on Lisinopril 40mg  daily  -5/14 BP a little soft at times, will decrease lisinopril to 20 mg daily to prevent hypotension  -5/16 pain controlled, continue to monitor   Vitals:   09/20/22 1930 09/21/22 0306 09/21/22 1354 09/21/22 1959  BP: 105/68 118/82 (!) 95/56 98/65   09/22/22 0446 09/22/22 1320 09/22/22 1952 09/23/22 0328  BP: 105/72 121/71 112/68 113/85   09/23/22 1459 09/23/22 2152 09/24/22 0424 09/24/22 1258  BP: 104/79 125/89 (!) 110/94 115/80    9. Neuropathy/chronic pain/CTS: On Gabapentin 300 mg TID with Cymbalta 90 mg/day             -- Consider addition of bilateral UE neutral wrist splints for CTS 10. H/o Bipolar d/o: Stable on Wellburtrin 300 mg w/Vraylar 1.5 mg             --on provigil 100mg  QD for activation 11. Hypokalemia: Will supplement X 6 doses. Check in am and on Monday.   -09/19/22 K 3.3, continue supplementation of BID for now  5/13 K_up to 4.3 today, start daily, recheck Wednesday  -5/15 K+ stable at 3.8, continue daily supplementation  12. H/o CALR +Leucocytosis: Has had rise from 4.4-->11.1 but likely baseline.  --Intermittently ranges from normal to 17.1 due to CALR+ and nicotine use per Hem/Onc --Monitor for fevers or other signs of infection.  -09/19/22 WBC 8.9, monitor on routine labs  5/13 WBC stable at 10.1 13. Mild OSA--no treatment  needed per last study 07/2020. 14. H/o Gastritis/Empiric  dilatation: GI symptoms managed with Protonix 40mg  BID 15. Constipation:  -09/19/22 small hard BMs yesterday, increase colace to 100mg  BID, has PRNs as well, monitor -09/20/22 still with harder BMs, will try miralax today if persistent, advised we can schedule that if she desires (rather than just PRN); monitor 09/21/22 continue miralax (prn for now per patient request)   -5/14 last BM 5/12, patient would still like medication as needed although we discussed that I think it be best if she takes this every day.  Will also order sorbitol as needed if she has more severe constipation.  -5/15 LBM today, improved, continue current regimen  -5/16 reports bowel movement 1 to 2-day ago, she plans to take as needed MiraLAX today. 16.  Carpal tunnel syndrome-patient reports she has history of this.  May benefit from additional evaluation EMG/nerve conduction study outpatient  -Order bilateral night splints  LOS: 6 days A FACE TO FACE EVALUATION WAS PERFORMED  Fanny Dance 09/24/2022, 4:53 PM

## 2022-09-24 NOTE — Progress Notes (Signed)
Physical Therapy Session Note  Patient Details  Name: Taylor [name] MRN: [id number] Date of Birth: June 13, 1990  Today's Date: 09/24/2022 PT Individual Time: 1115-1155 and 1415-1456 PT Individual Time Calculation (min): 40 min and 41 min  Short Term Goals: Week 1:  PT Short Term Goal 1 (Week 1): STG = LTG due to ELOS  Skilled Therapeutic Interventions/Progress Updates:   Treatment Session 1 Received pt sitting EOB requesting to toilet. Pt agreeable to PT treatment and reported pain 5/10 in R residual limb (premedicated). Session with emphasis on functional mobility/transfers, toileting, generalized strengthening and endurance, limb los education, WC mobility, and gait training. Donned R limb guard with set up assist and pt stood from EOB with RW and close supervision and ambulated in/out of bathroom with RW and CGA with total A to manage wound vac. Pt able to manage clothing standing on LLE without UE support and close supervision. Pt continent of bladder and performed pericare without assist. Pt able to don/doff limb guard with set up assist to determine which size shrinkers pt has - secure chatted MD and requested additional 4XL shrinkers. Educated pt on shrinker wear/care schedule (hand washing and laying flat to air dry), healing properties of shrinker, and importance of keeping shrinker directly in contact with incision after wound vac is removed. Pt concerned with drainage coming from wound vac - encouraged pt to elevate limb, due to tendency to have increased blood flow rushing to distal aspect of limb when limb is in dependent positions. Briefly discussed healing and prosthetic fitting timeline and importance of desensitization to prepare limb for prosthetic fitting. Pt then performed WC mobility 157ft x 2 trials using BUE and supervision to/from main therapy gym - discussed ordering WC gloves to protect hands and for comfort due to carpal tunnel. Stood with RW and close supervision and  ambulated 38ft with RW and close supervision with total A for equipment management - limited ambulation to avoid excessive compressive forces through R knee. Returned to room and pt transferred WC<>bed stand<>pivot to R without RW and close supervision using bedrail for support. Concluded session with pt sitting EOB with all needs within reach.    Treatment Session 2 Received pt sidelying in bed asleep. Upon wakening, exhausted from multiple therapies today, but agreeable to PT treatment, and reported pain 6-7/10 in R residual limb - RN notified and present at end of session to administer pain medication. Session with emphasis on functional mobility/transfers, generalized strengthening and endurance, dynamic standing balance/coordination, and toileting. Pt transferred sidelying<>sitting EOB from flat bed without bedrails mod I and donned L shoe and R limb guard with set up assist. Pt transferred bed<>WC stand<>pivot to L without AD and supervision and transported to/from room in [facility] dependently for time management purposes. Pt performed seated LLE strengthening on Kinetron at 20 cm/sec for 1 minute x 1 increasing to 10cm/sec for 1 minute x 3 trials with emphasis on glute/quad strength with therapist providing manual counter resistance. Pt performed the following exercises with emphasis on LE strength/ROM: -seated LAQ on LLE 2x15 with 3lb ankle weight -seated hip flexion on LLE 2x15 with 3lb ankle weight -seated WC pushups 2x8  -standing L heel raises 2x10 - limited ROM in ankle due to screw placement along 5 metatarsal Returned to room and transferred WC<>bed stand<>pivot with bedrail and supervision. Pt then reported urge to void and stood with RW and supervision and ambulated into bathroom with RW and supervision and assist to manage wound vac. Pt able to manage  clothing without assist and left in care of RN.  Therapy Documentation Precautions:  Precautions Precautions: Fall Precaution Comments: new R  BKA Required Braces or Orthoses: Other Brace Other Brace: limb protector Restrictions Weight Bearing Restrictions: Yes RLE Weight Bearing: Non weight bearing   Therapy/Group: Individual Therapy Marlana Salvage Zaunegger Blima Rich PT, DPT 09/24/2022, 7:24 AM

## 2022-09-24 NOTE — Progress Notes (Signed)
Orthopedic Tech Progress Note Patient Details:  Taylor [name] 11-07-1990 [id number]  Ortho Devices Type of Ortho Device: Wrist splint Ortho Device/Splint Location: BUE Ortho Device/Splint Interventions: Ordered, Application, Adjustment   Post Interventions Patient Tolerated: Well Instructions Provided: Adjustment of device, Care of device  Kieara Schwark OTR/L 09/24/2022, 9:02 PM

## 2022-09-24 NOTE — Patient Care Conference (Signed)
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 Fanny Dance Social Worker Present: Dossie Der, LCSW Nurse Present: Chana Bode, RN PT Present: Ambrose Finland, PT OT Present: Roney Mans, OT PPS Coordinator present : Fae Pippin, SLP     Current Status/Progress Goal Weekly Team Focus  Bowel/Bladder   continent to bowel and bladder last bowel movent 5/12   pt to remain continent   assist patient with toileting needs    Swallow/Nutrition/ Hydration               ADL's   supervision for all ADL tasks at RW level ; did have bad night of sleep due to pain and effected her one day of therapy   mod I for ADLs and transfers   Pt will likely need to ambulate into bathroom - practice at RW level, shower if wound vac comes off, ability to look at her leg and care for her leg after wound vac comes off. IADLs at w/c level    Mobility   bed mobility independent, sit<>stand, stand pivot with RW supervision, ambualtion x 35 feet with supervision/CGA stair naviagation x12 steps with shower chair method and CGA with therapist navigating shower chair, wheelchair mobility x150 feet independently   mod I  teach daily skin  inspection, increase dynamic balance    Communication                Safety/Cognition/ Behavioral Observations               Pain   pt pain uncontrolled on current regimen, 9/10 some breakthrough pain oxy 15mg  Q4   pain <3/10   assess q shift and PRn cold and heat per order    Skin   Right BKA, wound vac at -125 no drainage present  in canister   no s/s  of infection  assess every shift and PRN      Discharge Planning:  Going home with friend since level entry and her home has a flight of steps. Dad currently caring for her three yo daughter. Pt is motivated and pushes her self in therapies. Will order DME and HH once have the recommendations   Team Discussion: Patient with increased pain and drainage from wound vac post right BKA.  Noted edema of residual limb wearing shrinker and limb guard.  Limited by pain and anxiety.  Patient on target to meet rehab goals: yes, currently mod I overall and able to ambualte to the toilet using grab bars for transfers.   *See Care Plan and progress notes for long and short-term goals.   Revisions to Treatment Plan:  Wound vac therapy thru ~ 09/25/22  Neuro psych referral for coping.  Teaching  Needs: Safety, skin care, medications, dietary modification, etc.   Current Barriers to Discharge: Decreased caregiver support and Home enviroment access/layout  Possible Resolutions to Barriers: Family education 09/24/22 DME rolling walker, wheelchair and shower chair, [facility]     Medical Summary Current Status: R BKA, hypokalemia, pain, constipation, HTN  Barriers to Discharge: Complicated Wound;Uncontrolled Pain;Medical stability  Barriers to Discharge Comments: R BKA, hypokalemia, pain, constipation, HTN, wound vac Possible Resolutions to [employer] Focus: Contiue wound vac for now, monitor K+, increase gabapentin, continue laxatives   Continued Need for Acute Rehabilitation Level of Care: The patient requires daily medical  management by a physician with specialized training in physical medicine and rehabilitation for the following reasons: Direction of a multidisciplinary physical rehabilitation program to maximize functional independence : Yes Medical management of patient stability for increased activity during participation in an intensive rehabilitation regime.: Yes Analysis of laboratory values and/or radiology reports with any subsequent need for medication adjustment and/or medical intervention. : Yes   I attest that I was present, lead the team conference, and concur with the assessment and plan of the team.   Chana Bode B 09/24/2022, 10:28 AM

## 2022-09-25 DIAGNOSIS — I10 Essential (primary) hypertension: Secondary | ICD-10-CM | POA: Diagnosis not present

## 2022-09-25 DIAGNOSIS — M79604 Pain in right leg: Secondary | ICD-10-CM | POA: Diagnosis not present

## 2022-09-25 DIAGNOSIS — S88111S Complete traumatic amputation at level between knee and ankle, right lower leg, sequela: Secondary | ICD-10-CM | POA: Diagnosis not present

## 2022-09-25 DIAGNOSIS — K5900 Constipation, unspecified: Secondary | ICD-10-CM | POA: Diagnosis not present

## 2022-09-25 MED ORDER — GABAPENTIN 300 MG PO CAPS
600.0000 mg | ORAL_CAPSULE | Freq: Three times a day (TID) | ORAL | Status: DC
  Administered 2022-09-25 – 2022-09-28 (×10): 600 mg via ORAL
  Filled 2022-09-25 (×10): qty 2

## 2022-09-25 NOTE — Progress Notes (Signed)
Physical Therapy Session Note  Patient Details  Name: Taylor [name] MRN: [id number] Date of Birth: [birth year]  Today's Date: 09/25/2022 PT Individual Time: 4098-1191 PT Individual Time Calculation (min): 41 min   Short Term Goals: Week 1:  PT Short Term Goal 1 (Week 1): STG = LTG due to ELOS  Skilled Therapeutic Interventions/Progress Updates:   Received pt supine in bed, pt agreeable to PT treatment, and reported phantom pain 5/10 in R residual limb - RN present to administer pain medication. Session with emphasis on functional mobility/transfers, toileting, generalized strengthening and endurance, dynamic standing balance/coordination, and stair navigation. Pt performed bed mobility from flat bed independently and donned limb guard and L shoe with set up assist. Stood from EOB with RW and supervision and ambulated in/out of bathroom with RW and supervision. Pt able to manage clothing, void, and perform pericare without assist. Pt transported to/from room in [facility] dependently for time management purposes. Pt navigated 6 6in steps with L handrail and supervision using shower chair technique with assist to manage wound vac. Pt required cues for foot placement on step but able to move shower chair up/down steps without assist. Pt told therapist she plans to driver herself upon discharge. Informed her that MD is the one to clear pt to return to driving, and advised against driving until cleared by MD, is off her pain medication, and limb is healed. Returned to room and pt left in care of NT changing bed linens.  Therapy Documentation Precautions:  Precautions Precautions: Fall Precaution Comments: new R BKA Required Braces or Orthoses: Other Brace Other Brace: limb protector Restrictions Weight Bearing Restrictions: Yes RLE Weight Bearing: Non weight bearing  Therapy/Group: Individual Therapy Marlana Salvage Zaunegger Blima Rich PT, DPT 09/25/2022, 7:19 AM

## 2022-09-25 NOTE — Progress Notes (Signed)
Physical Therapy Session Note  Patient Details  Name: Taylor [name] MRN: [id number] Date of Birth: 23-Apr-1991  Today's Date: 09/25/2022 PT Individual Time: 4098-1191 PT Individual Time Calculation (min): 53 min   Short Term Goals: Week 1:  PT Short Term Goal 1 (Week 1): STG = LTG due to ELOS  Skilled Therapeutic Interventions/Progress Updates:    Patient agreeable to participate in PT session. Reports slight headache but does not rate.  Patient participated in skilled PT session focusing on functional transfers and WC mobility.   Pt sitting EOB at start of session and donned Rt limb protector with supervision. Pt able to manage WC set up for squat pivot transfer with min cues for brake management. Pt completed squat pivot bed>WC with CGA. Patient set up WC leg rests with min cues and supervision, pt taking extra time and frustrated by fatigue and difficulty feeling brakes and leg rest anchors. Pt self propelled with bli UE use to ortho gym and required ~2 rest breaks for ~140' propulsion.   Patient completed squat pivot transfer WC<>EOM with supervision. Repeated sit<>stands completed with RW for Lt LE strengthening, 1x10 reps. Pt performed additional sit<>stand for standing LE strengthening of Rt LE. - 2x10 hip extension - 2x10 hip abduction  Patient performed squat pivot transfers EOM>WC with supervision and propelled to ramp in ortho gym for WC mobility and functional UE strengthening. 4x ascending ramp in forward motion with min CGA/min assist intermittently as pt fatigued. Pt descended with CGA for safety; pt with good use of UE's/grip to control speed of descent.   EOS pt self propelled WC with Bil UE from main gym to room, ~140'. Pt aligned WC to bed and managed brakes with supervision, pt forgetting to raise arm rest back but demonstrated excellent clearance with squat pivot transfer. EOS pt remained seated EOB, Alarm on and call bell within reach.  Therapy  Documentation Precautions:  Precautions Precautions: Fall Precaution Comments: new R BKA Required Braces or Orthoses: Other Brace Other Brace: limb protector Restrictions Weight Bearing Restrictions: Yes RLE Weight Bearing: Non weight bearing    Therapy/Group: Individual Therapy  Wynn Maudlin, DPT Acute Rehabilitation Services Office [phone]  09/25/22 8:00 AM

## 2022-09-25 NOTE — Progress Notes (Signed)
Occupational Therapy Session Note  Patient Details  Name: Taylor [name] MRN: [id number] Date of Birth: June 22, 1990  Today's Date: 09/25/2022 OT Individual Time: 0803-0828 & [phone] & 1505-1530 OT Individual Time Calculation (min): 25 min & 55 min & 25 min   Short Term Goals: Week 1:  OT Short Term Goal 1 (Week 1): STG = LTG due to ELOS  Skilled Therapeutic Interventions/Progress Updates:  Session 1 Skilled OT intervention completed with focus on limb care education. Pt received seated EOB, agreeable to session. No pain reported.  Pt verbalized the plan for wound vac removal today, stating that she is eager to learn how to manage the incision in prep for DC.   Education provided on wound vac removal process/aftermath effects. Also discussed the mechanism of the black shrinker by Dr. Lajoyce Corners with healing properties, that is intended to be applied directly to the limb. However did discuss modifications that can be made for incision/skin integrity such as with open spots or active bleeds, including placing a non-adherent pad, only the size of the spot, to prevent the sock adhering to the wound and removal of skin/reopening the wound with doffing of the shrinker. Retrieved non-adherent pads to demo to pt, as well as kerlix with instruction to cover only the spot with the pad that way the rest of the wound gets the healing benefit, then donn shrinker then kerlix wrap if draining a mod amount. Pt receptive to this education.  Pt remained seated EOB, with bed alarm on/activated, fresh drink provided per request and with all needs in reach at end of session.  Session 2 Skilled OT intervention completed with focus on ADL retraining, functional transfers. Pt received seated EOB, agreeable to session. 4/10 pain reported in R residual limb; pre-medicated. OT offered rest breaks, repositioning and distraction throughout for pain reduction.  MD present for rounds. Then pt requested to void. Sit > stand  and stand hop pivot using RW with close supervision. Supervision for dynamic standing balance for toileting steps. Continent of void only. Supervision sit > stand and stand pivot with RW to w/c.   OT encouraged bathing at sink for independence as pt unable to fully shower 2/2 wound vac in place. Seated at sink, pt was able to complete UB bathing/dressing, shaving of arm pits (no nicks noted), oral care, hair grooming with independently.   Able to stand at sink with sink for balance vs AD with overall supervision for washing peri-areas. Pt able to thread wound vac through pants with supervision, OT only assisting with wound vac pausing. Sit > stand supervision and donned over hips. Pt declined wearing limb guard at this time. Discussed her ADL routine at home from a seated level and TTB for showers.  Pt remained seated in w/c, with chair alarm on/activated, and with all needs in reach at end of session.  Session 3 Skilled OT intervention completed with focus on UE/cervical stretches, limb care education. Pt received supine in bed, a little frustrated about how much therapy she had scheduled but agreeable to session. Intermittent phantom pain reported in R residual limb; nurse notified of pain med request. OT offered rest breaks and repositioning throughout for pain reduction.   Education provided about typical wound vac removal as pt with anxiety regarding upcoming procedure.  Completed the following self-stretches seated EOB for prevention of shoulder/cervical pain with heavy dependency on RW along with for stiffness intervention (x5, with 5 sec hold each): -cervical lateral flexion -Cervical extension -levator scapulae stretch -circumduction Rt  then Lt -Shoulder rolls forwards/backwards -Scapular elevation/depression -Scapular retraction -Shoulder flexion bilaterally  Retrieved fresh drink per request, then pt remained seated EOB, with bed alarm on/activated, and with all needs in reach at  end of session.  Therapy Documentation Precautions:  Precautions Precautions: Fall Precaution Comments: new R BKA Required Braces or Orthoses: Other Brace Other Brace: limb protector Restrictions Weight Bearing Restrictions: Yes RLE Weight Bearing: Non weight bearing    Therapy/Group: Individual Therapy  Melvyn Novas, MS, OTR/L  09/25/2022, 3:37 PM

## 2022-09-25 NOTE — Progress Notes (Signed)
PROGRESS NOTE   Subjective/Complaints:  Working with therapy this AM. She reports limb pain is a little improved, continue to have phantom pain.   ROS:  Denies fevers, chills, CP, SOB, abd pain, N/V/D,constipation, new/worsening paresthesias/weakness,HA or any other complaints at this time.  + Back pain, neck pain, residual limb pain and phantom pain + Numbness in hands chronic-reports history of carpal tunnel syndrome   Objective:   No results found. No results for input(s): "WBC", "HGB", "HCT", "PLT" in the last 72 hours.  Recent Labs    09/23/22 0756  NA 134*  K 3.8  CL 99  CO2 23  GLUCOSE 121*  BUN 11  CREATININE 0.66  CALCIUM 9.8     Intake/Output Summary (Last 24 hours) at 09/25/2022 1246 Last data filed at 09/25/2022 0810 Gross per 24 hour  Intake 1661 ml  Output --  Net 1661 ml         Physical Exam: Vital Signs Blood pressure 105/75, pulse 91, temperature 98.7 F (37.1 C), temperature source Oral, resp. rate 20, height 5\' 4"  (1.626 m), SpO2 98 %.  Constitutional:      Appearance: Normal appearance.  Obese Edentulous, laying in bed.  Appears uncomfortable Cardiovascular:     Rate and Rhythm: Regular rhythm. Reg rate. No m/r/g Pulm: CTAB, no w/r/r Abdominal:     General: Abdomen is flat.     Palpations: Abdomen is soft. Nontender, nondistended, +BS throughout, no r/g/r.   PRIOR EXAMS: Musculoskeletal:     Comments: Wound VAC in place on R-BKA site - does not appear to have additional drainage from yesterday Full active range of motion of bilateral upper and left lower extremity Right lower extremity status post BKA; limited by pain, 4 out of 5 in hip flexor, knee extensor. Area on inner thigh that is marked with a maker-no significant erythema or warmth noted   Neurological:     Mental Status: She is alert and oriented to person, place, and time. Follows commands Sensory altered in bilateral  hands over median nerve distribution   Psychiatric:        Mood and Affect: Mood normal.        Behavior: Behavior normal.  Skin: No apparent lesions.  IV site looks clean dry intact.  Wound VAC over BKA distal site  Assessment/Plan: 1. Functional deficits which require 3+ hours per day of interdisciplinary therapy in a comprehensive inpatient rehab setting. Physiatrist is providing close team supervision and 24 hour management of active medical problems listed below. Physiatrist and rehab team continue to assess barriers to discharge/monitor patient progress toward functional and medical goals  Care Tool:  Bathing    Body parts bathed by patient: Right arm, Left arm, Chest, Abdomen, Front perineal area, Buttocks, Right upper leg, Left upper leg, Left lower leg, Face     Body parts n/a: Right lower leg   Bathing assist Assist Level: Supervision/Verbal cueing     Upper Body Dressing/Undressing Upper body dressing   What is the patient wearing?: Pull over shirt    Upper body assist Assist Level: Independent    Lower Body Dressing/Undressing Lower body dressing      What is  the patient wearing?: Pants     Lower body assist Assist for lower body dressing: Supervision/Verbal cueing     Toileting Toileting    Toileting assist Assist for toileting: Supervision/Verbal cueing     Transfers Chair/bed transfer  Transfers assist     Chair/bed transfer assist level: Supervision/Verbal cueing     Locomotion Ambulation   Ambulation assist      Assist level: Supervision/Verbal cueing Assistive device: Walker-rolling Max distance: 30ft   Walk 10 feet activity   Assist     Assist level: Supervision/Verbal cueing Assistive device: Walker-rolling   Walk 50 feet activity   Assist Walk 50 feet with 2 turns activity did not occur: Safety/medical concerns         Walk 150 feet activity   Assist Walk 150 feet activity did not occur: Safety/medical  concerns         Walk 10 feet on uneven surface  activity   Assist Walk 10 feet on uneven surfaces activity did not occur: Safety/medical concerns         Wheelchair     Assist Is the patient using a wheelchair?: Yes Type of Wheelchair: Manual    Wheelchair assist level: Supervision/Verbal cueing Max wheelchair distance: 150'    Wheelchair 50 feet with 2 turns activity    Assist        Assist Level: Supervision/Verbal cueing   Wheelchair 150 feet activity     Assist      Assist Level: Supervision/Verbal cueing   Blood pressure 105/75, pulse 91, temperature 98.7 F (37.1 C), temperature source Oral, resp. rate 20, height 5\' 4"  (1.626 m), SpO2 98 %.  Medical Problem List and Plan: 1. Functional deficits secondary to right BKA             -patient may not shower until wound vac is removed             -ELOS/Goals: 9-12 days, Mod I PT/OT  -Continue CIR  -- Poor tolerance of limb guard d/t cutting into thigh; OK to remove in bed, reminded to keep knee extended -Area of redness outlined by nursing yesterday appears improved, continue to monitor -Expected discharge 5/20 -Discussed with Dr. Lajoyce Corners, remove wound vac today 5/17    2.  Antithrombotics: -DVT/anticoagulation:  Pharmaceutical: Lovenox 40mg  QD             -antiplatelet therapy: ASA 325mg  QD 3. Pain Management: Oxycodone 10-15 mg prn for pain--discussed transition and use oral narcotics --change robaxin to flexeril 5mg  TID scheduled and prn doses as having many different issues.   -5/14 additional 5 mg oxycodone ordered this morning.  She reports back pain, neck pain and residual limb pain. 5/17 increase gabapentin to 600mg  TID for phantom pain, she used this dose in the past for pain 4. Mood/Behavior/Sleep: LCSW to follow for evaluation and support.              -antipsychotic agents: Vraylar 1.5mg  daily  -Trazodone PRN 5. Neuropsych/cognition: This patient is capable of making decisions on  her own behalf. 6. Skin/Wound Care: Routine pressure relief measures.  7. Fluids/Electrolytes/Nutrition: Monitor I/O. Check CMET in am             --continue Juven, Vitamin C and Zinc to promote wound healing.  8. HTN: Monitor BP TID--on Lisinopril 40mg  daily  -5/14 BP a little soft at times, will decrease lisinopril to 20 mg daily to prevent hypotension  -5/17 BP controlled, continue current regimen   Vitals:  09/21/22 1354 09/21/22 1959 09/22/22 0446 09/22/22 1320  BP: (!) 95/56 98/65 105/72 121/71   09/22/22 1952 09/23/22 0328 09/23/22 1459 09/23/22 2152  BP: 112/68 113/85 104/79 125/89   09/24/22 0424 09/24/22 1258 09/24/22 1959 09/25/22 0325  BP: (!) 110/94 115/80 115/65 105/75    9. Neuropathy/chronic pain/CTS: On Gabapentin 300 mg TID with Cymbalta 90 mg/day             -- Consider addition of bilateral UE neutral wrist splints for CTS 10. H/o Bipolar d/o: Stable on Wellburtrin 300 mg w/Vraylar 1.5 mg             --on provigil 100mg  QD for activation 11. Hypokalemia: Will supplement X 6 doses. Check in am and on Monday.   -09/19/22 K 3.3, continue supplementation of BID for now  5/13 K_up to 4.3 today, start daily, recheck Wednesday  -5/15 K+ stable at 3.8, continue daily supplementation , recheck monday 12. H/o CALR +Leucocytosis: Has had rise from 4.4-->11.1 but likely baseline.  --Intermittently ranges from normal to 17.1 due to CALR+ and nicotine use per Hem/Onc --Monitor for fevers or other signs of infection.  -09/19/22 WBC 8.9, monitor on routine labs  5/13 WBC stable at 10.1 13. Mild OSA--no treatment  needed per last study 07/2020. 14. H/o Gastritis/Empiric dilatation: GI symptoms managed with Protonix 40mg  BID 15. Constipation:  -09/19/22 small hard BMs yesterday, increase colace to 100mg  BID, has PRNs as well, monitor -09/20/22 still with harder BMs, will try miralax today if persistent, advised we can schedule that if she desires (rather than just  PRN); monitor 09/21/22 continue miralax (prn for now per patient request)   -5/14 last BM 5/12, patient would still like medication as needed although we discussed that I think it be best if she takes this every day.  Will also order sorbitol as needed if she has more severe constipation.  -5/15 LBM today, improved, continue current regimen  -5/16 reports bowel movement 1 to 2-day ago, she plans to take as needed MiraLAX today.  -5/17 BM yesterday, improved, continue miralax prn 16.  Carpal tunnel syndrome-patient reports she has history of this.  May benefit from additional evaluation EMG/nerve conduction study outpatient  -Order bilateral night splints  LOS: 7 days A FACE TO FACE EVALUATION WAS PERFORMED  Fanny Dance 09/25/2022, 12:46 PM

## 2022-09-25 NOTE — Progress Notes (Signed)
Patient ID: Taylor [name], female   DOB: April 14, 1991, 32 y.o.   MRN: [id number]  Met with pt who reports family training is set up for tomorrow with Dad. Have received her equipment via [employer]. [facility] to [facility] continued [facility] health follow up. Preparing for discharge Monday

## 2022-09-26 NOTE — Progress Notes (Signed)
PROGRESS NOTE   Subjective/Complaints:  Pt having concerns about having drainage from wound vac, and potential for going home with vac. Has young child at home, concerned that the tubing would be a hazard. Anxious about upcoming discharge.  Otherwise, no additional complaints. Slept ok, about normal for her. Pain controlled with meds. LBM yesterday. Urinating well. No additional concerns.   ROS:  Denies fevers, chills, CP, SOB, abd pain, N/V/D,constipation, new/worsening paresthesias/weakness,HA or any other complaints at this time.  + Back pain, neck pain, residual limb pain and phantom pain + Numbness in hands chronic-reports history of carpal tunnel syndrome   Objective:   No results found. No results for input(s): "WBC", "HGB", "HCT", "PLT" in the last 72 hours.  No results for input(s): "NA", "K", "CL", "CO2", "GLUCOSE", "BUN", "CREATININE", "CALCIUM" in the last 72 hours.  Intake/Output Summary (Last 24 hours) at 09/26/2022 1031 Last data filed at 09/26/2022 0810 Gross per 24 hour  Intake 1660 ml  Output 600 ml  Net 1060 ml        Physical Exam: Vital Signs Blood pressure 102/70, pulse 92, temperature 98 F (36.7 C), temperature source Oral, resp. rate 18, height 5\' 4"  (1.626 m), SpO2 96 %.  Constitutional:      Appearance: Normal appearance.  Obese Edentulous, laying in bed.  Appears comfortable Cardiovascular:     Rate and Rhythm: Regular rhythm. Reg rate. No m/r/g Pulm: CTAB, no w/r/r Abdominal:     General: Abdomen is flat.     Palpations: Abdomen is soft. Nontender, nondistended, +BS throughout, no r/g/r.  Extremities: R BKA with wound vac, minimal sanguinous output in tubing  PRIOR EXAMS: Musculoskeletal:     Comments: Wound VAC in place on R-BKA site - does not appear to have additional drainage from yesterday Full active range of motion of bilateral upper and left lower extremity Right lower  extremity status post BKA; limited by pain, 4 out of 5 in hip flexor, knee extensor. Area on inner thigh that is marked with a maker-no significant erythema or warmth noted   Neurological:     Mental Status: She is alert and oriented to person, place, and time. Follows commands Sensory altered in bilateral hands over median nerve distribution   Psychiatric:        Mood and Affect: Mood normal.        Behavior: Behavior normal.  Skin: No apparent lesions.  IV site looks clean dry intact.  Wound VAC over BKA distal site  Assessment/Plan: 1. Functional deficits which require 3+ hours per day of interdisciplinary therapy in a comprehensive inpatient rehab setting. Physiatrist is providing close team supervision and 24 hour management of active medical problems listed below. Physiatrist and rehab team continue to assess barriers to discharge/monitor patient progress toward functional and medical goals  Care Tool:  Bathing    Body parts bathed by patient: Right arm, Left arm, Chest, Abdomen, Front perineal area, Buttocks, Right upper leg, Left upper leg, Left lower leg, Face     Body parts n/a: Right lower leg   Bathing assist Assist Level: Supervision/Verbal cueing     Upper Body Dressing/Undressing Upper body dressing   What is  the patient wearing?: Pull over shirt    Upper body assist Assist Level: Independent    Lower Body Dressing/Undressing Lower body dressing      What is the patient wearing?: Pants     Lower body assist Assist for lower body dressing: Supervision/Verbal cueing     Toileting Toileting    Toileting assist Assist for toileting: Supervision/Verbal cueing     Transfers Chair/bed transfer  Transfers assist     Chair/bed transfer assist level: Supervision/Verbal cueing     Locomotion Ambulation   Ambulation assist      Assist level: Supervision/Verbal cueing Assistive device: Walker-rolling Max distance: 66ft   Walk 10 feet  activity   Assist     Assist level: Supervision/Verbal cueing Assistive device: Walker-rolling   Walk 50 feet activity   Assist Walk 50 feet with 2 turns activity did not occur: Safety/medical concerns         Walk 150 feet activity   Assist Walk 150 feet activity did not occur: Safety/medical concerns         Walk 10 feet on uneven surface  activity   Assist Walk 10 feet on uneven surfaces activity did not occur: Safety/medical concerns         Wheelchair     Assist Is the patient using a wheelchair?: Yes Type of Wheelchair: Manual    Wheelchair assist level: Supervision/Verbal cueing Max wheelchair distance: 150'    Wheelchair 50 feet with 2 turns activity    Assist        Assist Level: Supervision/Verbal cueing   Wheelchair 150 feet activity     Assist      Assist Level: Supervision/Verbal cueing   Blood pressure 102/70, pulse 92, temperature 98 F (36.7 C), temperature source Oral, resp. rate 18, height 5\' 4"  (1.626 m), SpO2 96 %.  Medical Problem List and Plan: 1. Functional deficits secondary to right BKA             -patient may not shower until wound vac is removed             -ELOS/Goals: 9-12 days, Mod I PT/OT  -Continue CIR  -- Poor tolerance of limb guard d/t cutting into thigh; OK to remove in bed, reminded to keep knee extended -Area of redness outlined by nursing yesterday appears improved, continue to monitor -Expected discharge 5/20 -Discussed with Dr. Lajoyce Corners, remove wound vac today 5/17-- order cancelled, defer to weekday team.     2.  Antithrombotics: -DVT/anticoagulation:  Pharmaceutical: Lovenox 40mg  QD             -antiplatelet therapy: ASA 325mg  QD 3. Pain Management: Oxycodone 10-15 mg prn for pain--discussed transition and use oral narcotics --change robaxin to flexeril 5mg  TID scheduled and prn doses as having many different issues.  -5/14 additional 5 mg oxycodone ordered this morning.  She reports  back pain, neck pain and residual limb pain. -5/17 increase gabapentin to 600mg  TID for phantom pain, she used this dose in the past for pain 4. Mood/Behavior/Sleep: LCSW to follow for evaluation and support.              -antipsychotic agents: Vraylar 1.5mg  daily  -Trazodone PRN, doesn't want to try melatonin 5. Neuropsych/cognition: This patient is capable of making decisions on her own behalf. 6. Skin/Wound Care: Routine pressure relief measures.  7. Fluids/Electrolytes/Nutrition: Monitor I/O. Check CMET in am             --continue Juven, Vitamin C and  Zinc to promote wound healing.  8. HTN: Monitor BP TID--on Lisinopril 40mg  daily -5/14 BP a little soft at times, will decrease lisinopril to 20 mg daily to prevent hypotension  -5/17-18 BP controlled, continue current regimen   Vitals:   09/22/22 1320 09/22/22 1952 09/23/22 0328 09/23/22 1459  BP: 121/71 112/68 113/85 104/79   09/23/22 2152 09/24/22 0424 09/24/22 1258 09/24/22 1959  BP: 125/89 (!) 110/94 115/80 115/65   09/25/22 0325 09/25/22 1359 09/25/22 1944 09/26/22 0455  BP: 105/75 (!) 116/93 120/75 102/70    9. Neuropathy/chronic pain/CTS: On Gabapentin 300 mg TID with Cymbalta 90 mg/day             -- Consider addition of bilateral UE neutral wrist splints for CTS  -5/17 gabapentin increased as above 10. H/o Bipolar d/o: Stable on Wellburtrin 300 mg w/Vraylar 1.5 mg             --on provigil 100mg  QD for activation 11. Hypokalemia: Will supplement X 6 doses. Check in am and on Monday.   -09/19/22 K 3.3, continue supplementation of BID for now  -5/13 K_up to 4.3 today, start daily, recheck Wednesday -5/15 K+ stable at 3.8, continue daily supplementation , recheck monday 12. H/o CALR +Leucocytosis: Has had rise from 4.4-->11.1 but likely baseline.  --Intermittently ranges from normal to 17.1 due to CALR+ and nicotine use per Hem/Onc --Monitor for fevers or other signs of infection.  -09/19/22 WBC 8.9, monitor  on routine labs 5/13 WBC stable at 10.1 13. Mild OSA--no treatment  needed per last study 07/2020. 14. H/o Gastritis/Empiric dilatation: GI symptoms managed with Protonix 40mg  BID 15. Constipation:  -09/19/22 small hard BMs yesterday, increase colace to 100mg  BID, has PRNs as well, monitor -09/20/22 still with harder BMs, will try miralax today if persistent, advised we can schedule that if she desires (rather than just PRN); monitor -09/21/22 continue miralax (prn for now per patient request) -5/14 last BM 5/12, patient would still like medication as needed although we discussed that I think it be best if she takes this every day.  Will also order sorbitol as needed if she has more severe constipation.  -5/15 LBM today, improved, continue current regimen -5/16 reports bowel movement 1 to 2-day ago, she plans to take as needed MiraLAX today.  -5/18 BM yesterday, more regular, continue miralax prn 16.  Carpal tunnel syndrome-patient reports she has history of this.  May benefit from additional evaluation EMG/nerve conduction study outpatient  -Order bilateral night splints  LOS: 8 days A FACE TO FACE EVALUATION WAS PERFORMED  [address] 09/26/2022, 10:31 AM

## 2022-09-26 NOTE — Progress Notes (Signed)
Physical Therapy Session Note  Patient Details  Name: Taylor [name] MRN: [id number] Date of Birth: [birth year]  Today's Date: 09/26/2022 PT Individual Time: 1410-1419 PT Individual Time Calculation (min): 9 min  and Today's Date: 09/26/2022 PT Missed Time: 51 Minutes Missed Time Reason: Patient fatigue;Patient unwilling to participate  Short Term Goals: Week 1:  PT Short Term Goal 1 (Week 1): STG = LTG due to ELOS  Skilled Therapeutic Interventions/Progress Updates:       NT assisting patient to the bathroom. Patient requesting female staff to assist and politely asking female PT to leave the room until she's finished.   Returned to her room at 1410 and patient sitting EOB. Squat<>pivot transfer with supervision from EOB to w/c, PT managing wound vac. No family present for family training or education. Patient reports her "friend" overslept and will not be coming. She reports her dad is "somewhere in the hospital" but not sure where. She is unable to get in contact with her dad as her dad doesn't have cell phone "minutes."   Informed patient we could still do therapy but patient reports she wants to wait until her family arrives because she's not sure her dad will be able to find her. She also reports she doesn't want to do any "therapy" and only wants to do family education/training.    Squat pivot transfer back to bed with supervision.  Pt left sitting EOB with all needs met. She missed therapy time due to refusal and no family present for training/education.  Therapy Documentation Precautions:  Precautions Precautions: Fall Precaution Comments: new R BKA Required Braces or Orthoses: Other Brace Other Brace: limb protector Restrictions Weight Bearing Restrictions: Yes RLE Weight Bearing: Non weight bearing General:     Therapy/Group: Individual Therapy  [name]  PT, DPT, CSRS  09/26/2022, 7:31 AM

## 2022-09-27 DIAGNOSIS — G4700 Insomnia, unspecified: Secondary | ICD-10-CM

## 2022-09-27 MED ORDER — OXYCODONE HCL 5 MG PO TABS
10.0000 mg | ORAL_TABLET | ORAL | Status: DC | PRN
  Administered 2022-09-27 – 2022-09-28 (×4): 15 mg via ORAL
  Filled 2022-09-27 (×5): qty 3

## 2022-09-27 NOTE — Progress Notes (Signed)
Physical Therapy Discharge Summary  Patient Details  Name: Taylor [name] MRN: [id number] Date of Birth: 01-Jun-1990  Date of Discharge from PT service:Sep 27, 2022  Today's Date: 09/27/2022 PT Individual Time: 1415-1434 PT Individual Time Calculation (min): 19 min    Patient has met 9 of 9 long term goals due to improved activity tolerance, improved balance, improved postural control, increased strength, decreased pain, ability to compensate for deficits, improved awareness, and improved coordination.  Patient to discharge at a wheelchair level with short distance ambulation and  Modified Independent.   Patient's care partner is independent to provide the necessary physical assistance at discharge.  Recommendation:  Patient will benefit from ongoing skilled PT services in home health setting to continue to advance safe functional mobility, address ongoing impairments in gait training, balance, endurance, and minimize fall risk.  Equipment: WC, RW, shower chair, BSC  Reasons for discharge: treatment goals met and discharge from hospital  Patient/family agrees with progress made and goals achieved: Yes  PT Discharge Precautions/Restrictions Precautions Precautions: Fall Precaution Comments: new R BKA Required Braces or Orthoses: Other Brace Other Brace: limb protector Restrictions Weight Bearing Restrictions: Yes RLE Weight Bearing: Non weight bearing Pain Interference Pain Interference Pain Effect on Sleep: 3. Frequently Pain Interference with Therapy Activities: 1. Rarely or not at all Pain Interference with Day-to-Day Activities: 3. Frequently Vision/Perception  Vision - History Ability to See in Adequate Light: 0 Adequate Perception Perception: Within Functional Limits Praxis Praxis: Intact  Cognition Overall Cognitive Status: Within Functional Limits for tasks assessed Arousal/Alertness: Awake/alert Orientation Level: Oriented X4 Memory: Appears  intact Awareness: Appears intact Problem Solving: Appears intact Safety/Judgment: Appears intact Sensation Sensation Light Touch: Impaired by gross assessment Hot/Cold: Not tested Proprioception: Appears Intact Stereognosis: Not tested Additional Comments: Impaired sensation on B hands distally 2/2 carpal tunnel, B LE sensation intect with exception of R distal residual limb, may be 2/2 to wound vac Coordination Gross Motor Movements are Fluid and Coordinated: No Fine Motor Movements are Fluid and Coordinated: Yes Coordination and Movement Description: limited by R BKA Motor  Motor Motor: Within Functional Limits Motor - Skilled Clinical Observations: impaired with new limb loss but functional mobility/locomotion  Mobility Bed Mobility Bed Mobility: Supine to Sit;Sit to Supine Supine to Sit: Independent Sit to Supine: Independent Transfers Transfers: Sit to Stand;Stand to Sit;Stand Pivot Transfers Sit to Stand: Independent with assistive device Stand to Sit: Independent with assistive device Stand Pivot Transfers: Independent with assistive device Transfer (Assistive device): Rolling walker Locomotion  Gait Ambulation: Yes Gait Assistance: Independent with assistive device Gait Distance (Feet): 35 Feet Assistive device: Rolling walker Gait Gait: Yes Gait Pattern: Impaired (hop to gait) Stairs / Additional Locomotion Stairs: Yes Stairs Assistance: Supervision/Verbal cueing Stair Management Technique: One rail Left Number of Stairs: 12 Height of Stairs: 6 Pick up small object from the floor assist level: Independent with assistive device Pick up small object from the floor assistive device: with RW and Chief Operating Officer Mobility: Yes Wheelchair Assistance: Independent with Scientist, research (life sciences): Both upper extremities Wheelchair Parts Management: Independent Distance: 150+  Trunk/Postural Assessment  Cervical  Assessment Cervical Assessment: Within Functional Limits Thoracic Assessment Thoracic Assessment: Within Functional Limits Lumbar Assessment Lumbar Assessment: Within Functional Limits Postural Control Postural Control: Within Functional Limits  Balance Balance Balance Assessed: Yes Static Sitting Balance Static Sitting - Balance Support: Feet supported;No upper extremity supported Static Sitting - Level of Assistance: 7: Independent Dynamic Sitting Balance Dynamic Sitting - Balance Support: Feet unsupported;Right upper  extremity supported;During functional activity Dynamic Sitting - Level of Assistance: 7: Independent Dynamic Sitting - Balance Activities: Lateral lean/weight shifting;Forward lean/weight shifting Static Standing Balance Static Standing - Balance Support: During functional activity;Right upper extremity supported Static Standing - Level of Assistance: 6: Modified independent (Device/Increase time) Dynamic Standing Balance Dynamic Standing - Balance Support: During functional activity;Right upper extremity supported Dynamic Standing - Level of Assistance: 6: Modified independent (Device/Increase time) Dynamic Standing - Balance Activities:  (pulling up pants) Dynamic Standing - Comments: RW Extremity Assessment  RUE Assessment RUE Assessment: Within Functional Limits LUE Assessment LUE Assessment: Within Functional Limits RLE Assessment RLE Assessment: Exceptions to [employer] General Strength Comments: grossly 4+/5 with exception of quad, limited by pain LLE Assessment LLE Assessment: Within Functional Limits    Today's Interventions  Treatment Session 1   Pt seated EOB upon arrival. Pt agreeable to therapy. Nurse present at beginning of session to administer medications. Pt anxious about wound vac and discharge.  PA present during session for daily rounds, PA provided reassurance to pt regarding MD assessing wound vac tomorrow and ensured pt will be well informed  upon discharge. Therapist notified PA that 3XL shrinker (x2) have not arrived, informed PA pt current size is too big, PA placed order for 3XL shrinker during session.  Pt reports 6/10 R LE incisional and phantom pain, premedicated. Therapist provided rest breaks as needed for pain/fatigue. Pt reports sudden sharp pains happening today.   Pt requesting to change clothes. Pt changed shirt with mod I, and pants with min A with therapist managing wound vac to feed line through pants. Pt requesting to use bathroom. Pt donned and doffed L shoe with mod I. Pt donned limb protector with mod I. Pt ambulated with RW and mod I to bathroom with therapist managing wound vac. Pt donned/doffed pants and performed pericare with mod I.   Therapist donned/doffed shrinker with supervision. Education provided for technique to clean shrinker, and frequency of cleaning (every other day). Pt practiced performing skin inspection with handheld mirror. Education provided to perform skin inspection daily. Education provided to notify MD if pt notices any wounds, abnormalities with skin inspection upon discharge.   Pt seated edge of bed at end of session with all needs within reach and bed alarm on.    Treatment Session 2   Pt seated EOB with nurse administering medications upon arrival. Pt reports 8/10 R phantom/residual/incisional pain. Pt in significant pain, very sleepy as pt did not sleep last night, and emotional and irritated.   Pt agreeable to sit EOB and perform last components of discharge assessment but pt not able to have pain medications at this time and unable to tolerate getting OOB 2/2 pain, pt requesting to go to sleep.   Therapist reviewed HEP with pt, provided pt with handout. Modified prone hip extension to standing hip extension with RW as pt unable to tolerate lying in prone 2/2 anxiety. Pt verbalized understanding of HEP.   Access Code: [email or web address] URL: [email or web address] Date:  09/27/2022 Prepared by: Ambrose Finland  Exercises - Supine Bridge  - 1 x daily - 7 x weekly - 3 sets - 10 reps - Sidelying Hip Abduction  - 1 x daily - 7 x weekly - 3 sets - 10 reps - Prone Hip Extension with Pillow Under Abdomen  - 1 x daily - 7 x weekly - 3 sets - 10 reps - Seated Long Arc Quad  - 1 x daily - 7 x weekly - 3 sets - 10  reps - Supine Straight Leg Raises  - 1 x daily - 7 x weekly - 3 sets - 10 reps - Wheelchair Push-Up (AKA)  - 1 x daily - 7 x weekly - 3 sets - 10 reps  Pt supine in bed with all needs within reach at end of session.   [facility] [place], [place], DPT  09/27/2022, 3:07 PM

## 2022-09-27 NOTE — Progress Notes (Signed)
PROGRESS NOTE   Subjective/Complaints:  Pt had some issues last night with pain meds not being given the way she was requesting, became tearful-- but this morning it's been better. Pain meds do control her pain but she finds the higher dose is more effective.  Otherwise, no additional complaints. Slept ok, normal for her. LBM yesterday. Urinating well. No additional concerns. PT asking about order for smaller shrinker.   ROS:  Denies fevers, chills, CP, SOB, abd pain, N/V/D,constipation, new/worsening paresthesias/weakness,HA or any other complaints at this time.  + Back pain, neck pain, residual limb pain and phantom pain + Numbness in hands chronic-reports history of carpal tunnel syndrome   Objective:   No results found. No results for input(s): "WBC", "HGB", "HCT", "PLT" in the last 72 hours.  No results for input(s): "NA", "K", "CL", "CO2", "GLUCOSE", "BUN", "CREATININE", "CALCIUM" in the last 72 hours.  Intake/Output Summary (Last 24 hours) at 09/27/2022 1030 Last data filed at 09/27/2022 0723 Gross per 24 hour  Intake 716 ml  Output --  Net 716 ml        Physical Exam: Vital Signs Blood pressure 124/71, pulse 95, temperature 97.7 F (36.5 C), temperature source Oral, resp. rate 18, height 5\' 4"  (1.626 m), SpO2 100 %.  Constitutional:      Appearance: Normal appearance.  Obese Edentulous, sitting at EOB working with PT.  Appears comfortable Cardiovascular:     Rate and Rhythm: Regular rhythm. Reg rate. No m/r/g Pulm: CTAB, no w/r/r Abdominal:     General: Abdomen is flat.     Palpations: Abdomen is soft. Nontender, nondistended, +BS throughout, no r/g/r.  Extremities: R BKA with wound vac, minimal sanguinous output in vac  PRIOR EXAMS: Musculoskeletal:     Comments: Wound VAC in place on R-BKA site - does not appear to have additional drainage from yesterday Full active range of motion of bilateral upper and  left lower extremity Right lower extremity status post BKA; limited by pain, 4 out of 5 in hip flexor, knee extensor. Area on inner thigh that is marked with a maker-no significant erythema or warmth noted   Neurological:     Mental Status: She is alert and oriented to person, place, and time. Follows commands Sensory altered in bilateral hands over median nerve distribution   Psychiatric:        Mood and Affect: Mood normal.        Behavior: Behavior normal.  Skin: No apparent lesions.  IV site looks clean dry intact.  Wound VAC over BKA distal site  Assessment/Plan: 1. Functional deficits which require 3+ hours per day of interdisciplinary therapy in a comprehensive inpatient rehab setting. Physiatrist is providing close team supervision and 24 hour management of active medical problems listed below. Physiatrist and rehab team continue to assess barriers to discharge/monitor patient progress toward functional and medical goals  Care Tool:  Bathing    Body parts bathed by patient: Right arm, Left arm, Chest, Abdomen, Front perineal area, Buttocks, Right upper leg, Left upper leg, Left lower leg, Face     Body parts n/a: Right lower leg   Bathing assist Assist Level: Supervision/Verbal cueing  Upper Body Dressing/Undressing Upper body dressing   What is the patient wearing?: Pull over shirt    Upper body assist Assist Level: Independent    Lower Body Dressing/Undressing Lower body dressing      What is the patient wearing?: Pants     Lower body assist Assist for lower body dressing: Supervision/Verbal cueing     Toileting Toileting    Toileting assist Assist for toileting: Supervision/Verbal cueing     Transfers Chair/bed transfer  Transfers assist     Chair/bed transfer assist level: Supervision/Verbal cueing     Locomotion Ambulation   Ambulation assist      Assist level: Supervision/Verbal cueing Assistive device: Walker-rolling Max  distance: 12ft   Walk 10 feet activity   Assist     Assist level: Supervision/Verbal cueing Assistive device: Walker-rolling   Walk 50 feet activity   Assist Walk 50 feet with 2 turns activity did not occur: Safety/medical concerns         Walk 150 feet activity   Assist Walk 150 feet activity did not occur: Safety/medical concerns         Walk 10 feet on uneven surface  activity   Assist Walk 10 feet on uneven surfaces activity did not occur: Safety/medical concerns         Wheelchair     Assist Is the patient using a wheelchair?: Yes Type of Wheelchair: Manual    Wheelchair assist level: Supervision/Verbal cueing Max wheelchair distance: 150'    Wheelchair 50 feet with 2 turns activity    Assist        Assist Level: Supervision/Verbal cueing   Wheelchair 150 feet activity     Assist      Assist Level: Supervision/Verbal cueing   Blood pressure 124/71, pulse 95, temperature 97.7 F (36.5 C), temperature source Oral, resp. rate 18, height 5\' 4"  (1.626 m), SpO2 100 %.  Medical Problem List and Plan: 1. Functional deficits secondary to right BKA             -patient may not shower until wound vac is removed             -ELOS/Goals: 9-12 days, Mod I PT/OT  -Continue CIR  -- Poor tolerance of limb guard d/t cutting into thigh; OK to remove in bed, reminded to keep knee extended -Area of redness outlined by nursing yesterday appears improved, continue to monitor -Expected discharge 5/20 -Discussed with Dr. Lajoyce Corners, remove wound vac today 5/17-- order cancelled, defer to weekday team.  -09/27/22 smaller shrinker ordered, may need to be measured, currently wearing 4xl so 3xl may work. Order placed.    2.  Antithrombotics: -DVT/anticoagulation:  Pharmaceutical: Lovenox 40mg  QD             -antiplatelet therapy: ASA 325mg  QD 3. Pain Management: Oxycodone 10-15 mg prn for pain--discussed transition and use oral narcotics --change robaxin  to flexeril 5mg  TID scheduled and prn doses as having many different issues.  -5/14 additional 5 mg oxycodone ordered this morning.  She reports back pain, neck pain and residual limb pain. -5/17 increase gabapentin to 600mg  TID for phantom pain, she used this dose in the past for pain 4. Mood/Behavior/Sleep: LCSW to follow for evaluation and support.              -antipsychotic agents: Vraylar 1.5mg  daily  -Trazodone PRN, doesn't want to try melatonin 5. Neuropsych/cognition: This patient is capable of making decisions on her own behalf. 6. Skin/Wound Care: Routine pressure  relief measures.  7. Fluids/Electrolytes/Nutrition: Monitor I/O. Check CMET in am             --continue Juven, Vitamin C and Zinc to promote wound healing.  8. HTN: Monitor BP TID--on Lisinopril 40mg  daily -5/14 BP a little soft at times, will decrease lisinopril to 20 mg daily to prevent hypotension  -5/17-19 BP controlled, continue current regimen   Vitals:   09/23/22 1459 09/23/22 2152 09/24/22 0424 09/24/22 1258  BP: 104/79 125/89 (!) 110/94 115/80   09/24/22 1959 09/25/22 0325 09/25/22 1359 09/25/22 1944  BP: 115/65 105/75 (!) 116/93 120/75   09/26/22 0455 09/26/22 1300 09/26/22 1933 09/27/22 0354  BP: 102/70 109/73 108/74 124/71    9. Neuropathy/chronic pain/CTS: On Gabapentin 300 mg TID with Cymbalta 90 mg/day             -- Consider addition of bilateral UE neutral wrist splints for CTS  -5/17 gabapentin increased as above 10. H/o Bipolar d/o: Stable on Wellburtrin 300 mg w/Vraylar 1.5 mg             --on provigil 100mg  QD for activation 11. Hypokalemia: Will supplement X 6 doses. Check in am and on Monday.   -09/19/22 K 3.3, continue supplementation of BID for now  -5/13 K_up to 4.3 today, start daily, recheck Wednesday -5/15 K+ stable at 3.8, continue daily supplementation , recheck monday 12. H/o CALR +Leucocytosis: Has had rise from 4.4-->11.1 but likely baseline.  --Intermittently  ranges from normal to 17.1 due to CALR+ and nicotine use per Hem/Onc --Monitor for fevers or other signs of infection.  -09/19/22 WBC 8.9, monitor on routine labs -5/13 WBC stable at 10.1 13. Mild OSA--no treatment  needed per last study 07/2020. 14. H/o Gastritis/Empiric dilatation: GI symptoms managed with Protonix 40mg  BID 15. Constipation:  -09/19/22 small hard BMs yesterday, increase colace to 100mg  BID, has PRNs as well, monitor -09/20/22 still with harder BMs, will try miralax today if persistent, advised we can schedule that if she desires (rather than just PRN); monitor -09/21/22 continue miralax (prn for now per patient request) -5/14 last BM 5/12, patient would still like medication as needed although we discussed that I think it be best if she takes this every day.  Will also order sorbitol as needed if she has more severe constipation.  -5/15 LBM today, improved, continue current regimen -5/16 reports bowel movement 1 to 2-day ago, she plans to take as needed MiraLAX today.  -5/18-19 BMs daily, more regular, continue miralax prn 16.  Carpal tunnel syndrome-patient reports she has history of this.  May benefit from additional evaluation EMG/nerve conduction study outpatient  -Order bilateral night splints  LOS: 9 days A FACE TO FACE EVALUATION WAS PERFORMED  [address] [place] Jethro Radke 09/27/2022, 10:30 AM

## 2022-09-27 NOTE — Progress Notes (Signed)
Occupational Therapy Discharge Summary  Patient Details  Name: Taylor [name] MRN: [id number] Date of Birth: [birth year]  Date of Discharge from OT service:Sep 27, 2022  Today's Date: 09/27/2022 OT Individual Time: 1000-1058 OT Individual Time Calculation (min): 58 min    Patient has met 8 of 8 long term goals due to improved activity tolerance, improved balance, postural control, ability to compensate for deficits, and improved coordination.  Patient to discharge at overall Modified Independent level.  Patient's care partner is independent to provide the necessary physical assistance at discharge.  Per pt, pt to DC to friends home before DC to 2nd floor apartment, pt to have assist from friend and family members at time of DC. Pt has TTB, BSC, and RW.   Reasons goals not met: NA  Recommendation:  Patient will benefit from ongoing skilled OT services in home health setting to continue to advance functional skills in the area of BADL and iADL.  Equipment: BSC and TTB  Reasons for discharge: treatment goals met and discharge from hospital  Patient/family agrees with progress made and goals achieved: Yes    Skilled Interventions: Pt bed level at time of session, agreeable to OT session and pt aware of DC home tomorrow and grad day today. Pt able to perform all bed mobility MOD I, simualted ADL tasks MOD I for bathing/dressing, performing functional mobility with RW MOD I to/from bathroom to simulate DC environment and performing all aspects of toileting MOD I, etc. Pt discussion regarding DME and DC home, pt able to recall all DME and verbalize how to use. Pt guided through hair washing sink level and educated on where to purchase board if desired for home use. Pt with 6/10 pain at end of session and RN notified, provided pain meds at this time. Pt set up bed level all needs emt.   OT Discharge Precautions/Restrictions  Precautions Precautions: Fall Precaution Comments: new R  BKA Required Braces or Orthoses: Other Brace Other Brace: limb protector Restrictions Weight Bearing Restrictions: Yes RLE Weight Bearing: Non weight bearing Pain Pain Assessment Pain Scale: 0-10 Pain Score: 6  Faces Pain Scale: No hurt Pain Type: Surgical pain Pain Descriptors / Indicators: Discomfort Pain Frequency: Constant Pain Onset: Gradual Pain Intervention(s): Medication (See eMAR) ADL ADL Eating: Independent Where Assessed-Eating: Wheelchair Grooming: Independent Where Assessed-Grooming: Wheelchair Upper Body Bathing: Modified independent Where Assessed-Upper Body Bathing: Edge of bed Lower Body Bathing: Modified independent Where Assessed-Lower Body Bathing: Edge of bed Upper Body Dressing: Independent Where Assessed-Upper Body Dressing: Edge of bed Lower Body Dressing: Modified independent Where Assessed-Lower Body Dressing: Edge of bed Toileting: Modified independent Where Assessed-Toileting: Wheelchair (simulated at w/c) Toilet Transfer: Modified independent Statistician Method: Surveyor, minerals: Other (comment) (simulated to recliner, RW) Tub/Shower Transfer: Unable to assess Tub/Shower Transfer Method: Unable to assess Film/video editor: Unable to assess Film/video editor Method: Unable to assess Vision Baseline Vision/History: 0 No visual deficits Patient Visual Report: No change from baseline Vision Assessment?: No apparent visual deficits Perception  Perception: Within Functional Limits Praxis Praxis: Intact Cognition Cognition Overall Cognitive Status: Within Functional Limits for tasks assessed Arousal/Alertness: Awake/alert Orientation Level: Person;Place;Situation Person: Oriented Place: Oriented Situation: Oriented Memory: Appears intact Awareness: Appears intact Problem Solving: Appears intact Safety/Judgment: Appears intact Brief Interview for Mental Status (BIMS) Repetition of Three Words (First  Attempt): 3 Temporal Orientation: Year: Correct Temporal Orientation: Month: Accurate within 5 days Temporal Orientation: Day: Correct Recall: "Sock": Yes, no cue required Recall: "Blue": Yes, no  cue required Recall: "Bed": Yes, no cue required BIMS Summary Score: 15 Sensation Sensation Light Touch: Impaired by gross assessment Proprioception: Appears Intact Coordination Gross Motor Movements are Fluid and Coordinated: No Fine Motor Movements are Fluid and Coordinated: Yes Motor  Motor Motor: Within Functional Limits Motor - Skilled Clinical Observations: impaired with new limb loss but functional for BADL tasks Mobility  Transfers Sit to Stand: Independent with assistive device Stand to Sit: Independent with assistive device  Trunk/Postural Assessment  Cervical Assessment Cervical Assessment: Within Functional Limits Thoracic Assessment Thoracic Assessment: Within Functional Limits Lumbar Assessment Lumbar Assessment: Within Functional Limits Postural Control Postural Control: Within Functional Limits  Balance Balance Balance Assessed: Yes Static Sitting Balance Static Sitting - Balance Support: Feet supported;No upper extremity supported Static Sitting - Level of Assistance: 7: Independent Dynamic Sitting Balance Dynamic Sitting - Balance Support: Feet unsupported;Right upper extremity supported;During functional activity Dynamic Sitting - Level of Assistance: 7: Independent Dynamic Sitting - Balance Activities: Lateral lean/weight shifting;Forward lean/weight shifting Static Standing Balance Static Standing - Balance Support: During functional activity;Bilateral upper extremity supported Static Standing - Level of Assistance: 6: Modified independent (Device/Increase time) Dynamic Standing Balance Dynamic Standing - Balance Support: During functional activity;Bilateral upper extremity supported Dynamic Standing - Level of Assistance: 6: Modified independent  (Device/Increase time) Dynamic Standing - Balance Activities: Lateral lean/weight shifting;Forward lean/weight shifting Extremity/Trunk Assessment RUE Assessment RUE Assessment: Within Functional Limits LUE Assessment LUE Assessment: Within Functional Limits   Erasmo Score 09/27/2022, 12:36 PM

## 2022-09-27 NOTE — Plan of Care (Signed)
  Problem: RH Balance Goal: LTG Patient will maintain dynamic standing balance (PT) Description: LTG:  Patient will maintain dynamic standing balance with assistance during mobility activities (PT) Outcome: Completed/Met   Problem: Sit to Stand Goal: LTG:  Patient will perform sit to stand with assistance level (PT) Description: LTG:  Patient will perform sit to stand with assistance level (PT) Outcome: Completed/Met   Problem: RH Bed Mobility Goal: LTG Patient will perform bed mobility with assist (PT) Description: LTG: Patient will perform bed mobility with assistance, with/without cues (PT). Outcome: Completed/Met   Problem: RH Bed to Chair Transfers Goal: LTG Patient will perform bed/chair transfers w/assist (PT) Description: LTG: Patient will perform bed to chair transfers with assistance (PT). Outcome: Completed/Met   Problem: RH Car Transfers Goal: LTG Patient will perform car transfers with assist (PT) Description: LTG: Patient will perform car transfers with assistance (PT). Outcome: Completed/Met   Problem: RH Ambulation Goal: LTG Patient will ambulate in controlled environment (PT) Description: LTG: Patient will ambulate in a controlled environment, # of feet with assistance (PT). Outcome: Completed/Met   Problem: RH Wheelchair Mobility Goal: LTG Patient will propel w/c in controlled environment (PT) Description: LTG: Patient will propel wheelchair in controlled environment, # of feet with assist (PT) Outcome: Completed/Met Goal: LTG Patient will propel w/c in home environment (PT) Description: LTG: Patient will propel wheelchair in home environment, # of feet with assistance (PT). Outcome: Completed/Met   Problem: RH Stairs Goal: LTG Patient will ambulate up and down stairs w/assist (PT) Description: LTG: Patient will ambulate up and down # of stairs with assistance (PT) Outcome: Completed/Met

## 2022-09-27 NOTE — Progress Notes (Signed)
Orthopedic Tech Progress Note Patient Details:  Taylor [name] 1991/03/03 [id number] BK Shrinker x2 has been ordered from [facility].  Patient ID: [name], female   DOB: 1990-09-11, 32 y.o.   MRN: [id number]  Smitty Pluck 09/27/2022, 10:54 AM

## 2022-09-28 ENCOUNTER — Other Ambulatory Visit (HOSPITAL_COMMUNITY): Payer: Self-pay

## 2022-09-28 DIAGNOSIS — G546 Phantom limb syndrome with pain: Secondary | ICD-10-CM | POA: Insufficient documentation

## 2022-09-28 LAB — CBC
HCT: 36.9 % (ref 36.0–46.0)
Hemoglobin: 12.1 g/dL (ref 12.0–15.0)
MCH: 30.9 pg (ref 26.0–34.0)
MCHC: 32.8 g/dL (ref 30.0–36.0)
MCV: 94.1 fL (ref 80.0–100.0)
Platelets: 371 10*3/uL (ref 150–400)
RBC: 3.92 MIL/uL (ref 3.87–5.11)
RDW: 13.4 % (ref 11.5–15.5)
WBC: 10.3 10*3/uL (ref 4.0–10.5)
nRBC: 0 % (ref 0.0–0.2)

## 2022-09-28 LAB — BASIC METABOLIC PANEL
Anion gap: 11 (ref 5–15)
BUN: 16 mg/dL (ref 6–20)
CO2: 25 mmol/L (ref 22–32)
Calcium: 9.5 mg/dL (ref 8.9–10.3)
Chloride: 96 mmol/L — ABNORMAL LOW (ref 98–111)
Creatinine, Ser: 0.64 mg/dL (ref 0.44–1.00)
GFR, Estimated: >60 mL/min (ref >60)
Glucose, Bld: 107 mg/dL — ABNORMAL HIGH (ref 70–99)
Potassium: 3.9 mmol/L (ref 3.5–5.1)
Sodium: 132 mmol/L — ABNORMAL LOW (ref 135–145)

## 2022-09-28 MED ORDER — GABAPENTIN 300 MG PO CAPS
600.0000 mg | ORAL_CAPSULE | Freq: Three times a day (TID) | ORAL | 0 refills | Status: DC
  Filled 2022-09-28: qty 180, 30d supply, fill #0

## 2022-09-28 MED ORDER — LISINOPRIL 20 MG PO TABS
20.0000 mg | ORAL_TABLET | Freq: Every day | ORAL | 0 refills | Status: DC
Start: 2022-09-28 — End: 2022-10-27
  Filled 2022-09-28: qty 30, 30d supply, fill #0

## 2022-09-28 MED ORDER — CYCLOBENZAPRINE HCL 5 MG PO TABS
5.0000 mg | ORAL_TABLET | Freq: Three times a day (TID) | ORAL | 0 refills | Status: DC
  Filled 2022-09-28: qty 100, 34d supply, fill #0

## 2022-09-28 MED ORDER — OXYCODONE HCL 10 MG PO TABS
15.0000 mg | ORAL_TABLET | Freq: Four times a day (QID) | ORAL | 0 refills | Status: DC | PRN
  Filled 2022-09-28: qty 40, 7d supply, fill #0

## 2022-09-28 MED ORDER — OXYCODONE HCL 10 MG PO TABS
15.0000 mg | ORAL_TABLET | Freq: Four times a day (QID) | ORAL | 0 refills | Status: DC | PRN
  Filled 2022-09-28: qty 28, 5d supply, fill #0

## 2022-09-28 MED ORDER — DULOXETINE HCL 60 MG PO CPEP
120.0000 mg | ORAL_CAPSULE | Freq: Every day | ORAL | 0 refills | Status: DC
  Filled 2022-09-28: qty 60, 30d supply, fill #0

## 2022-09-28 MED ORDER — DOCUSATE SODIUM 100 MG PO CAPS
100.0000 mg | ORAL_CAPSULE | Freq: Two times a day (BID) | ORAL | 0 refills | Status: DC
  Filled 2022-09-28: qty 100, 50d supply, fill #0

## 2022-09-28 MED ORDER — ZINC SULFATE 220 (50 ZN) MG PO TABS
220.0000 mg | ORAL_TABLET | Freq: Every day | ORAL | 0 refills | Status: DC
  Filled 2022-09-28: qty 100, 100d supply, fill #0

## 2022-09-28 MED ORDER — ASCORBIC ACID 1000 MG PO TABS
1000.0000 mg | ORAL_TABLET | Freq: Every day | ORAL | 0 refills | Status: DC
  Filled 2022-09-28: qty 100, 100d supply, fill #0

## 2022-09-28 MED ORDER — LISINOPRIL 40 MG PO TABS
20.0000 mg | ORAL_TABLET | Freq: Every day | ORAL | 1 refills | Status: DC
Start: 2022-09-28 — End: 2022-09-28

## 2022-09-28 NOTE — Telephone Encounter (Signed)
Erroneous encounter will close.

## 2022-09-28 NOTE — Progress Notes (Signed)
Inpatient Rehabilitation Care Coordinator Discharge Note   Patient Details  Name: Taylor [name] MRN: [id number] Date of Birth: Mar 10, 1991   Discharge location: HOME WITH DAD AND DAUGHTER-DAD CAN PROVIDE 24/7 SUPERVISION  Length of Stay: 10 DAYS  Discharge activity level: MOD/I WHEELCHAIR LEVEL  Home/community participation: ACTIVE  Patient response BM:WUXLKG Literacy - How often do you need to have someone help you when you read instructions, pamphlets, or other written material from your doctor or pharmacy?: Never  Patient response MW:NUUVOZ Isolation - How often do you feel lonely or isolated from those around you?: Never  Services provided included: MD, RD, PT, OT, RN, CM, TR, Pharmacy, Neuropsych, SW  Financial Services:  Field seismologist Utilized: [employer] offered to/list presented to: PT  Follow-up services arranged:  Home Health, DME, Patient/Family has no preference for HH/DME agencies Home Health Agency: [facility]  PT  OT RN    DME : [facility]  WHEECHAIR, 3 IN1 AND TUB BENCH-PT ORDERED ROLLING WALKER AND TUB SEAT ON-LINE    Patient response to transportation need: Is the patient able to respond to transportation needs?: Yes In the past 12 months, has lack of transportation kept you from medical appointments or from getting medications?: No In the past 12 months, has lack of transportation kept you from meetings, work, or from getting things needed for daily living?: No   Patient/Family verbalized understanding of follow-up arrangements:  Yes  Individual responsible for coordination of the follow-up plan: SELF AND DAD [phone]  Confirmed correct DME delivered: Lucy Chris 09/28/2022    Comments (or additional information):PT DID WELL AND DAD WAS IN OVER THE WEEKEND FOR FAMILY EDUCATION FEELS READY TO GO HOME  Summary of Stay    Date/Time Discharge Planning CSW  09/23/22 0934 Going home with friend since level entry  and her home has a flight of steps. Dad currently caring for her three yo daughter. Pt is motivated and pushes her self in therapies. Will order DME and HH once have the recommendations RGD       Margit Batte, Lemar Livings

## 2022-09-28 NOTE — Discharge Summary (Signed)
Physician Discharge Summary  Patient ID: Taylor [name] MRN: [id number] DOB/AGE: 1991/01/27 32 y.o.  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 Conventional radiographs reveal fractured screws along the tibiotalar arthrodesis and possible bony erosion along the anterior plate. EXAM: CT OF THE RIGHT ANKLE WITH CONTRAST TECHNIQUE: Multidetector CT imaging of the right ankle was performed following the standard protocol during bolus administration of intravenous contrast. RADIATION DOSE REDUCTION: This exam was performed according to the departmental dose-optimization program which includes automated exposure control, adjustment of the mA and/or kV according to patient size and/or use of iterative reconstruction technique. CONTRAST:  75mL OMNIPAQUE IOHEXOL 300 MG/ML  SOLN COMPARISON:  Multiple exams, including radiographs of 09/09/2022 FINDINGS: Bones/Joint/Cartilage All 3 of the tibial screws of the tibiotalar plate and screw fixator are fractured. Moreover, there has been marked bony resorption of the talar dome compared to 10/23/2021 resulting in exposure and lucency around two of the central talar fixation screws. There is abnormal lucency tracking around the two talar head/neck screws indicating loosening or infection. There is also lucency around the volar plate with thick periosteal reaction surrounding the proximal 4.2 cm of the plate. Thick periostitis noted  around the distal tibia, this could be a manifestation of chronic osteomyelitis. Cystic lesions and bony irregularity and resorption along the tibial plafond and noted with marked loss of height and bony resorption of the talar dome and sclerosis in the remainder of the talus. No solid interbody bridging bone at the tibiotalar articulation. Chronic osteomyelitis involving the talus is likewise a distinct possibility. No complicating feature related to the two distal fibular retrograde cannulated screws. There is some residual deformity from prior fracture as well as small ossicles below the fibula, but no fibular nonunion. Mild proximal dorsal spurring of the navicular. There is posterior spurring of the posterior subtalar joint. Ligaments Suboptimally assessed by CT. Muscles and Tendons Abnormal thickened appearance of the tibialis anterior tendon volar to the tibiotalar articulation for example on image 97 series 7. Calcifications below the medial malleolus result in some indistinctness and displacement of the tibialis posterior tendon. Soft tissues Substantial volar edema tracking posterior and anterior to the tibialis anterior tendon at the level of the tibiotalar articulation compatible with local inflammation and edema and more confluent than elsewhere. Strictly speaking I can not totally exclude the possibility of a draining sinus tract, correlate with any visible volar drainage along the ankle. There is prominent medial, lateral, and volar subcutaneous edema along the ankle and tracking into the dorsum of the foot. No drainable abscess. IMPRESSION: 1. 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 2. No solid interbody bridging bone at the tibiotalar articulation. 3. Abnormal thickened appearance of the tibialis anterior tendon volar to the tibiotalar  articulation. Substantial volar edema tracking posterior and anterior to the tibialis anterior tendon at the level of the tibiotalar articulation compatible with local inflammation and edema and more confluent than elsewhere. I can not totally exclude the possibility of a draining sinus tract, correlate with any visible volar drainage along the ankle. 4. Prominent medial, lateral, and volar subcutaneous edema along the ankle and tracking into the dorsum of the foot. 5. Mild posterior spurring of the posterior subtalar joint. 6. Mild proximal dorsal spurring of the navicular. Electronically Signed   By: Taylor [name] M.D.   On: 09/09/2022 18:30   CT FOOT RIGHT W CONTRAST  Result Date: 09/09/2022 CLINICAL DATA:  Chronic foot pain. Least partial failure of tibiotalar arthrodesis with new resorptive findings. EXAM: CT OF THE RIGHT FOOT WITH CONTRAST TECHNIQUE: Multidetector CT imaging of the lower right extremity was performed according to the standard protocol following intravenous contrast administration. RADIATION DOSE REDUCTION: This exam was performed according to the departmental dose-optimization program which includes automated exposure control, adjustment of the mA and/or kV according to patient size and/or use of iterative reconstruction technique. CONTRAST:  75mL OMNIPAQUE IOHEXOL 300 MG/ML  SOLN COMPARISON:  Radiographs 09/09/2022 FINDINGS: Bones/Joint/Cartilage Imaging planes for the dedicated foot exam are chosen with regard to the midfoot and forefoot rather than the hindfoot. Hindfoot foot findings will be discussed on the dedicated ankle CT. There are mild degenerative findings along the first metatarsal head the adjacent sesamoids. No malalignment at the Lisfranc joint. The phalanges and metatarsals appear otherwise unremarkable.  Small os peroneus noted. There is some bony demineralization observed particularly in the midfoot which may reflect osteoporosis of disuse. Ligaments Suboptimally assessed by CT. Muscles and Tendons Mild flexor digitorum longus tenosynovitis just past the knot of Henry. Soft tissues Substantial subcutaneous edema tracks dorsally along the forefoot, and to a lesser extent along the lateral plantar ball of the foot. This is nonspecific with regard to cellulitis versus sterile edema. No abscess identified. IMPRESSION: 1. Substantial subcutaneous edema tracks dorsally along the forefoot, and to a lesser extent along the lateral plantar ball of the foot. This is nonspecific with regard to cellulitis versus sterile edema. No abscess identified. 2. Mild flexor digitorum longus tenosynovitis just past the knot of Henry. 3. Mild degenerative findings along the first metatarsal head and adjacent sesamoids. 4. Bony demineralization particularly in the midfoot may reflect osteoporosis of disuse. 5. Please note, tibiotalar and hindfoot related findings are communicated under the dedicated CT ankle report. Electronically Signed   By: Taylor [name] M.D.   On: 09/09/2022 18:18   US Venous Img Lower Right (DVT Study)  Result Date: 09/09/2022 CLINICAL DATA:  Pain and swelling EXAM: Right LOWER EXTREMITY VENOUS DOPPLER ULTRASOUND TECHNIQUE: Gray-scale sonography with compression, as well as color and duplex ultrasound, were performed to evaluate the deep venous system(s) from the level of the common femoral vein through the popliteal and proximal calf veins. COMPARISON:  None Available. FINDINGS: VENOUS Normal compressibility of the common femoral, superficial femoral, and popliteal veins, as well as the visualized calf veins. Visualized portions of profunda femoral vein and great saphenous vein unremarkable. No filling defects to suggest DVT on grayscale or color Doppler imaging. Doppler waveforms show normal direction of  venous flow, normal respiratory plasticity and response to augmentation. Limited views of the contralateral common femoral vein are unremarkable. OTHER There is a reniform shaped lymph node in the right inguinal region with fatty hilum suggesting possible reactive hyperplasia of lymph node. There is  edema in subcutaneous plane in the right lower leg without any loculated fluid collections. Limitations: none IMPRESSION: There is no evidence of deep venous thrombosis in right lower extremity. Electronically Signed   By: Ernie Avena M.D.   On: 09/09/2022 15:58   DG Foot Complete Right  Result Date: 09/09/2022 CLINICAL DATA:  Lower extremity edema EXAM: RIGHT FOOT COMPLETE - 3+ VIEW; RIGHT ANKLE - COMPLETE 3+ VIEW COMPARISON:  09/05/2021 FINDINGS: Prior tibiotalar arthrodesis with anterior plate and screw construct. There is a new fracture through the proximal most screw within the distal tibial metadiaphysis. The additional tibial screws are chronically fractured. Increasing perihardware lucency about the screws within the talar dome, which appear to be backing out. There is increasing lucency around the tibial plate. Additional screws within the posterior talus appear intact. Intact lateral malleolar screws. Erosive or resorptive changes within the talar dome and tibial plafond, new from prior. No evidence of acute fracture or dislocation. Marked diffuse soft tissue swelling. IMPRESSION: 1. Prior tibiotalar arthrodesis with evidence of hardware loosening including multiple fractured screws in the tibia and increasing perihardware lucency. 2. Erosive or resorptive changes within the talar dome and tibial plafond, new from prior. Correlate for signs of infection as septic arthritis and osteomyelitis could have this appearance. 3. Marked diffuse soft tissue swelling of the right foot and ankle. Electronically Signed   By: Duanne Guess D.O.   On: 09/09/2022 15:39   DG Ankle Complete Right  Result Date:  09/09/2022 CLINICAL DATA:  Lower extremity edema EXAM: RIGHT FOOT COMPLETE - 3+ VIEW; RIGHT ANKLE - COMPLETE 3+ VIEW COMPARISON:  09/05/2021 FINDINGS: Prior tibiotalar arthrodesis with anterior plate and screw construct. There is a new fracture through the proximal most screw within the distal tibial metadiaphysis. The additional tibial screws are chronically fractured. Increasing perihardware lucency about the screws within the talar dome, which appear to be backing out. There is increasing lucency around the tibial plate. Additional screws within the posterior talus appear intact. Intact lateral malleolar screws. Erosive or resorptive changes within the talar dome and tibial plafond, new from prior. No evidence of acute fracture or dislocation. Marked diffuse soft tissue swelling. IMPRESSION: 1. Prior tibiotalar arthrodesis with evidence of hardware loosening including multiple fractured screws in the tibia and increasing perihardware lucency. 2. Erosive or resorptive changes within the talar dome and tibial plafond, new from prior. Correlate for signs of infection as septic arthritis and osteomyelitis could have this appearance. 3. Marked diffuse soft tissue swelling of the right foot and ankle. Electronically Signed   By: Duanne Guess D.O.   On: 09/09/2022 15:39    Labs:  Basic Metabolic Panel:    Latest Ref Rng & Units 09/28/2022    4:44 AM 09/23/2022    7:56 AM 09/21/2022    6:48 AM  BMP  Glucose 70 - 99 mg/dL 161  096  95   BUN 6 - 20 mg/dL 16  11  15    Creatinine 0.44 - 1.00 mg/dL 0.45  4.09  8.11   Sodium 135 - 145 mmol/L 132  134  136   Potassium 3.5 - 5.1 mmol/L 3.9  3.8  4.3   Chloride 98 - 111 mmol/L 96  99  102   CO2 22 - 32 mmol/L 25  23  22    Calcium 8.9 - 10.3 mg/dL 9.5  9.8  9.3      CBC:    Latest Ref Rng & Units 09/28/2022    4:44 AM 09/21/2022  6:48 AM 09/19/2022    7:18 AM  CBC  WBC 4.0 - 10.5 K/uL 10.3  10.1  8.9   Hemoglobin 12.0 - 15.0 g/dL 16.1  09.6  9.7    Hematocrit 36.0 - 46.0 % 36.9  33.8  30.8   Platelets 150 - 400 K/uL 371  287  251      CBG: No results for input(s): "GLUCAP" in the last 168 hours.  Brief HPI:   Taylor [name] is a 32 y.o. female ***   Hospital Course: EVIN SANDHU was admitted to rehab 09/18/2022 for inpatient therapies to consist of PT, and OT at least three hours five days a week. Past admission physiatrist, therapy team and rehab RN have worked together to provide customized collaborative inpatient rehab.   Blood pressures were monitored on TID basis and   Diabetes has been monitored with ac/hs CBG checks and SSI was use prn for tighter BS control.    Rehab course: During patient's stay in rehab weekly team conferences were held to monitor patient's progress, set goals and discuss barriers to discharge. At admission, patient required  He/She  has had improvement in activity tolerance, balance, postural control as well as ability to compensate for deficits. He/She has had improvement in functional use RUE/LUE  and RLE/LLE as well as improvement in awareness       Disposition:  There are no questions and answers to display.         Diet:  Special Instructions:  Discharge Instructions     Ambulatory referral to Physical Medicine Rehab   Complete by: As directed       Allergies as of 09/28/2022       Reactions   Other Itching   Animal Dander        Medication List     STOP taking these medications    acetaminophen 650 MG CR tablet Commonly known as: TYLENOL Replaced by: acetaminophen 325 MG tablet   zolpidem 6.25 MG CR tablet Commonly known as: AMBIEN CR       TAKE these medications    acetaminophen 325 MG tablet Commonly known as: TYLENOL Take 1-2 tablets (325-650 mg total) by mouth every 4 (four) hours as needed for mild pain. Replaces: acetaminophen 650 MG CR tablet   ascorbic acid 1000 MG tablet Commonly known as: VITAMIN C Take 1 tablet (1,000 mg total)  by mouth daily. Start taking on: Sep 29, 2022   buPROPion 300 MG 24 hr tablet Commonly known as: WELLBUTRIN XL Take 300 mg by mouth every morning.   cyclobenzaprine 5 MG tablet Commonly known as: FLEXERIL Take 1 tablet (5 mg total) by mouth 3 (three) times daily.   docusate sodium 100 MG capsule Commonly known as: COLACE Take 1 capsule (100 mg total) by mouth 2 (two) times daily.   doxylamine (Sleep) 25 MG tablet Commonly known as: UNISOM Take 25 mg by mouth at bedtime as needed for sleep.   DULoxetine 60 MG capsule Commonly known as: CYMBALTA Take 2 capsules (120 mg total) by mouth daily. What changed:  how much to take additional instructions Another medication with the same name was removed. Continue taking this medication, and follow the directions you see here.   gabapentin 300 MG capsule Commonly known as: NEURONTIN Take 2 capsules (600 mg total) by mouth 3 (three) times daily.   lisinopril 20 MG tablet Commonly known as: ZESTRIL Take 1 tablet (20 mg total) by mouth daily. What changed:  medication strength  how much to take   Nuvigil 200 MG Tabs Generic drug: Armodafinil Take 1 tablet by mouth every morning.   Oxycodone HCl 10 MG Tabs Take 1.5 tablets (15 mg total) by mouth every 6 (six) hours as needed. Need to taper down to one pill four times a day over next 1-2 weeks.   pantoprazole 40 MG tablet Commonly known as: PROTONIX Take 1 tablet (40 mg total) by mouth 2 (two) times daily.   Vraylar 1.5 MG capsule Generic drug: cariprazine Take 1.5 mg by mouth daily.   Zinc Sulfate 220 (50 Zn) MG Tabs Take 1 tablet (220 mg total) by mouth daily. Start taking on: Sep 29, 2022        Follow-up Information     [name], FNP Follow up.   Specialty: Family Medicine Why: Call in 1-2 days for post hospital follow up Contact information: [address] [place] [address] [phone]         Fanny Dance, MD Follow up.    Specialty: Physical Medicine and Rehabilitation Why: office will call you with follow up appointment Contact information: [address] [place] [address] [phone]         Nadara Mustard, MD Follow up.   Specialty: Orthopedic Surgery Why: Call in 1-2 days for post hospital follow up Contact information: [address] [place] [address] [phone]                 Signed: Jacquelynn Cree 09/28/2022, 11:13 AM

## 2022-09-28 NOTE — Progress Notes (Signed)
PROGRESS NOTE   Subjective/Complaints:  Looking forward to going home today. Still has wound vac in place today. Report pain controlled with current medications.   ROS:  Denies fevers, chills, cough, CP, SOB, abd pain, N/V/D,constipation, new/worsening paresthesias/weakness or any other complaints at this time.  + Back pain, neck pain, residual limb pain and phantom pain + Numbness in hands chronic-reports history of carpal tunnel syndrome   Objective:   No results found. Recent Labs    09/28/22 0444  WBC 10.3  HGB 12.1  HCT 36.9  PLT 371    Recent Labs    09/28/22 0444  NA 132*  K 3.9  CL 96*  CO2 25  GLUCOSE 107*  BUN 16  CREATININE 0.64  CALCIUM 9.5    Intake/Output Summary (Last 24 hours) at 09/28/2022 0930 Last data filed at 09/28/2022 0700 Gross per 24 hour  Intake 925 ml  Output 0 ml  Net 925 ml         Physical Exam: Vital Signs Blood pressure (!) 115/94, pulse 96, temperature 98.2 F (36.8 C), resp. rate 19, height 5\' 4"  (1.626 m), SpO2 97 %.  Constitutional:      Appearance: Normal appearance.  Obese Edentulous, sitting at EOB working with PT.  Appears comfortable Cardiovascular:     Rate and Rhythm: Regular rhythm. Reg rate. No m/r/g Pulm: CTAB, no w/r/r Abdominal:     General: Abdomen is flat.     Palpations: Abdomen is soft. Nontender, nondistended, +BS throughout, no r/g/r.  Extremities: R BKA with wound vac   Musculoskeletal:     Comments: Wound VAC in place on R-BKA site - does not appear to have additional drainage from last wek Full active range of motion of bilateral upper and left lower extremity    Neurological:     Mental Status: She is alert and oriented to person, place, and time. Follows commands Sensory altered in bilateral hands over median nerve distribution   Psychiatric:        Mood and Affect: Mood normal.        Behavior: Behavior normal.  Skin: No  apparent lesions. Wound VAC over BKA distal site  Assessment/Plan: 1. Functional deficits which require 3+ hours per day of interdisciplinary therapy in a comprehensive inpatient rehab setting. Physiatrist is providing close team supervision and 24 hour management of active medical problems listed below. Physiatrist and rehab team continue to assess barriers to discharge/monitor patient progress toward functional and medical goals  Care Tool:  Bathing    Body parts bathed by patient: Right arm, Left arm, Chest, Abdomen, Front perineal area, Buttocks, Right upper leg, Left upper leg, Left lower leg, Face     Body parts n/a: Right lower leg   Bathing assist Assist Level: Independent with assistive device     Upper Body Dressing/Undressing Upper body dressing   What is the patient wearing?: Pull over shirt    Upper body assist Assist Level: Independent    Lower Body Dressing/Undressing Lower body dressing      What is the patient wearing?: Pants     Lower body assist Assist for lower body dressing: Independent with assitive device  Toileting Toileting    Toileting assist Assist for toileting: Independent with assistive device     Transfers Chair/bed transfer  Transfers assist     Chair/bed transfer assist level: Independent with assistive device     Locomotion Ambulation   Ambulation assist      Assist level: Independent with assistive device Assistive device: Walker-rolling Max distance: 35 feet   Walk 10 feet activity   Assist     Assist level: Independent with assistive device Assistive device: Walker-rolling   Walk 50 feet activity   Assist Walk 50 feet with 2 turns activity did not occur: Safety/medical concerns         Walk 150 feet activity   Assist Walk 150 feet activity did not occur: Safety/medical concerns         Walk 10 feet on uneven surface  activity   Assist Walk 10 feet on uneven surfaces activity did not  occur: Safety/medical concerns         Wheelchair     Assist Is the patient using a wheelchair?: Yes Type of Wheelchair: Manual    Wheelchair assist level: Independent Max wheelchair distance: 150+    Wheelchair 50 feet with 2 turns activity    Assist        Assist Level: Independent   Wheelchair 150 feet activity     Assist      Assist Level: Independent   Blood pressure (!) 115/94, pulse 96, temperature 98.2 F (36.8 C), resp. rate 19, height 5\' 4"  (1.626 m), SpO2 97 %.  Medical Problem List and Plan: 1. Functional deficits secondary to right BKA             -patient may not shower until wound vac is removed             -ELOS/Goals: 9-12 days, Mod I PT/OT  -Continue CIR  -- Poor tolerance of limb guard d/t cutting into thigh; OK to remove in bed, reminded to keep knee extended -Area of redness outlined by nursing yesterday appears improved, continue to monitor -Expected discharge 5/20 -Discussed with Dr. Lajoyce Corners, remove wound vac today 5/17 -09/27/22 smaller shrinker ordered, may need to be measured, currently wearing 4xl so 3xl may work. Order placed.  -DC wound vac -DC home later day   2.  Antithrombotics: -DVT/anticoagulation:  Pharmaceutical: Lovenox 40mg  QD             -antiplatelet therapy: ASA 325mg  QD 3. Pain Management: Oxycodone 10-15 mg prn for pain--discussed transition and use oral narcotics --change robaxin to flexeril 5mg  TID scheduled and prn doses as having many different issues.  -5/14 additional 5 mg oxycodone ordered this morning.  She reports back pain, neck pain and residual limb pain. -5/17 increase gabapentin to 600mg  TID for phantom pain, she used this dose in the past for pain -5/18 leg pain controlled this AM 4. Mood/Behavior/Sleep: LCSW to follow for evaluation and support.              -antipsychotic agents: Vraylar 1.5mg  daily  -Trazodone PRN, doesn't want to try melatonin 5. Neuropsych/cognition: This patient is capable  of making decisions on her own behalf. 6. Skin/Wound Care: Routine pressure relief measures.  7. Fluids/Electrolytes/Nutrition: Monitor I/O. Check CMET in am             --continue Juven, Vitamin C and Zinc to promote wound healing.  8. HTN: Monitor BP TID--on Lisinopril 40mg  daily -5/14 BP a little soft at times, will decrease lisinopril to  20 mg daily to prevent hypotension  -5/20 BP controlled   Vitals:   09/24/22 1258 09/24/22 1959 09/25/22 0325 09/25/22 1359  BP: 115/80 115/65 105/75 (!) 116/93   09/25/22 1944 09/26/22 0455 09/26/22 1300 09/26/22 1933  BP: 120/75 102/70 109/73 108/74   09/27/22 0354 09/27/22 1424 09/27/22 1941 09/28/22 0517  BP: 124/71 110/72 115/76 (!) 115/94    9. Neuropathy/chronic pain/CTS: On Gabapentin 300 mg TID with Cymbalta 90 mg/day             -- Consider addition of bilateral UE neutral wrist splints for CTS  -5/17 gabapentin increased as above 10. H/o Bipolar d/o: Stable on Wellburtrin 300 mg w/Vraylar 1.5 mg             --on provigil 100mg  QD for activation 11. Hypokalemia: Will supplement X 6 doses. Check in am and on Monday.   -09/19/22 K 3.3, continue supplementation of BID for now  -5/13 K_up to 4.3 today, start daily, recheck Wednesday -5/15 K+ stable at 3.8, continue daily supplementation , recheck Monday -5/20 K+ stable at 3.9 12. H/o CALR +Leucocytosis: Has had rise from 4.4-->11.1 but likely baseline.  --Intermittently ranges from normal to 17.1 due to CALR+ and nicotine use per Hem/Onc --Monitor for fevers or other signs of infection.  -09/19/22 WBC 8.9, monitor on routine labs -5/20 WBC stable at 10.3 13. Mild OSA--no treatment  needed per last study 07/2020. 14. H/o Gastritis/Empiric dilatation: GI symptoms managed with Protonix 40mg  BID 15. Constipation:  -09/19/22 small hard BMs yesterday, increase colace to 100mg  BID, has PRNs as well, monitor -09/20/22 still with harder BMs, will try miralax today if persistent,  advised we can schedule that if she desires (rather than just PRN); monitor -09/21/22 continue miralax (prn for now per patient request) -5/14 last BM 5/12, patient would still like medication as needed although we discussed that I think it be best if she takes this every day.  Will also order sorbitol as needed if she has more severe constipation.  -5/15 LBM today, improved, continue current regimen -5/16 reports bowel movement 1 to 2-day ago, she plans to take as needed MiraLAX today.  -5/18-19 BMs daily, more regular, continue miralax prn 16.  Carpal tunnel syndrome-patient reports she has history of this.  May benefit from additional evaluation EMG/nerve conduction study outpatient  -Order bilateral night splints  LOS: 10 days A FACE TO FACE EVALUATION WAS PERFORMED  Fanny Dance 09/28/2022, 9:30 AM

## 2022-09-28 NOTE — Progress Notes (Signed)
Orthopedic Tech Progress Note Patient Details:  Taylor [name] [birth year] [id number]  RN called requesting a new SHRINKER. Patient was given a pair of shrinkers a few days ago but RN said patient only had 1.  Patient ID: Taylor [name], female   DOB: June 11, 1990, 32 y.o.   MRN: [id number]  Taylor [name] 09/28/2022, 10:03 AM

## 2022-09-28 NOTE — Progress Notes (Signed)
Wound vac discontinued per MD order. Patient medicated before wound vac d/c. Patient tolerated. Picture taken. Pam PA notified.New orders noted.

## 2022-09-28 NOTE — Progress Notes (Signed)
INPATIENT REHABILITATION DISCHARGE NOTE   Discharge instructions by:Pam   Verbalized understanding: yes Skin care/Wound care healing? Wound vac removed today; some swelling noted; no bleeding on right extremity; noted sutures and staples inrtact. Black sock to be placed before discharge. Instructed on care of stump  Pain:prn med given prior to wound vac d/c  IV's:none  Tubes/Drains:none  O2: none Safety instructions:done  Patient belongings:done  Discharged to: home Discharged via:car  Notes: Drainage on stump when patient about to leave . ABD pads placed outside of shrinker with ace to hold.

## 2022-09-28 NOTE — Progress Notes (Signed)
Inpatient Rehabilitation Discharge Medication Review by a Pharmacist  A complete drug regimen review was completed for this patient to identify any potential clinically significant medication issues.  High Risk Drug Classes Is patient taking? Indication by Medication  Antipsychotic Yes Cariprazine - bipolar disorder  Anticoagulant No   Antibiotic No   Opioid Yes Oxycodone - pain, to taper over the next 1-2 weeks  Antiplatelet No   Hypoglycemics/insulin No   Vasoactive Medication Yes Lisinopril - hypertension  Chemotherapy No   Other Yes Bupropion XL, duloxetine - mood stablization Cyclobenzaprine - muscle spasms Gabapentin - phantom pain Modafanil - attention/initiation Pantoprazole - GERD Docusate - laxative Vitamin C, Zinc - supplements  PRN:  Acetaminophen - mild pain Resuming PTA Unisom qhs prn at discharge     Type of Medication Issue Identified Description of Issue Recommendation(s)  Drug Interaction(s) (clinically significant)     Duplicate Therapy     Allergy     No Medication Administration End Date     Incorrect Dose     Additional Drug Therapy Needed     Significant med changes from prior encounter (inform family/care partners about these prior to discharge). Lisinopril dose decreased and Duloxetine dose increased this admit.  Staying off prior Zolpidem CR qhs.  New: cyclobenzaprine, gabapentin, Vitamin C, Zinc, docusate, prn Oxycodone. Communicate to patient/family.  Other       Clinically significant medication issues were identified that warrant physician communication and completion of prescribed/recommended actions by midnight of the next day:  No  Time spent performing this drug regimen review (minutes):  [address]   Dennie Fetters, [place] 09/28/2022 12:08 PM

## 2022-09-29 ENCOUNTER — Telehealth: Payer: Self-pay

## 2022-09-29 ENCOUNTER — Telehealth: Payer: Self-pay | Admitting: Orthopedic Surgery

## 2022-09-29 MED ORDER — TRAZODONE HCL 50 MG PO TABS: 50.0000 mg | ORAL_TABLET | Freq: Every evening | ORAL | 0 refills | Status: DC | PRN

## 2022-09-29 NOTE — Telephone Encounter (Signed)
I tried to call pt and mailbox is full. I will hold and try again later. She was d/c from CIR yesterday following an BKA. Pt can have post op appt in the office for eval this week.

## 2022-09-29 NOTE — Telephone Encounter (Signed)
Taylor [name] was not given a script for Trazodone at discharge. Per patient it was given PRN during her hospital stay. Will you be refilling the Trazodone?   Call back phone [phone].  (If so send to: [facility] on [address] in [place])

## 2022-09-29 NOTE — Telephone Encounter (Signed)
Pt has an appt tomorrow °

## 2022-09-29 NOTE — Telephone Encounter (Signed)
Patient states she wants to speak to someone about her surgical site, she is advising that she took that bandage off and it was discolored in the incision area.

## 2022-09-29 NOTE — Telephone Encounter (Signed)
Called patient back voicemail full.

## 2022-09-30 ENCOUNTER — Ambulatory Visit (INDEPENDENT_AMBULATORY_CARE_PROVIDER_SITE_OTHER): Payer: Medicare Other | Admitting: Orthopedic Surgery

## 2022-09-30 ENCOUNTER — Encounter: Payer: Self-pay | Admitting: Family

## 2022-09-30 DIAGNOSIS — S88111A Complete traumatic amputation at level between knee and ankle, right lower leg, initial encounter: Secondary | ICD-10-CM

## 2022-09-30 MED ORDER — DOXYCYCLINE HYCLATE 100 MG PO TABS: 100.0000 mg | ORAL_TABLET | Freq: Two times a day (BID) | ORAL | 0 refills | Status: DC

## 2022-09-30 NOTE — Transitions of Care (Post Inpatient/ED Visit) (Unsigned)
   09/30/2022  Name: Taylor [name] MRN: [id number] DOB: Jul 15, 1990  Today's TOC FU Call Status: Today's TOC FU Call Status:: Unsuccessful Call (2nd Attempt) Unsuccessful Call (2nd Attempt) Date: 09/30/22  Attempted to reach the patient regarding the most recent Inpatient/ED visit.  Follow Up Plan: Additional outreach attempts will be made to reach the patient to complete the Transitions of Care (Post Inpatient/ED visit) call.  Karena Addison, LPN [facility] Nurse Health Advisor Direct Dial [phone]  Signature ***
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
## 2022-10-01 NOTE — Transitions of Care (Post Inpatient/ED Visit) (Signed)
10/01/2022  Name: Taylor [name] MRN: [id number] DOB: [birth year]  Today's TOC FU Call Status: Today's TOC FU Call Status:: Successful TOC FU Call Competed Unsuccessful Call (2nd Attempt) Date: 09/30/22 [facility] FU Call Complete Date: 10/01/22  Transition Care Management Follow-up Telephone Call Date of Discharge: 09/28/22 Discharge Facility: [facility]) Type of Discharge: Inpatient Admission Primary Inpatient Discharge Diagnosis:: right leg amp How have you been since you were released from the hospital?: Same Any questions or concerns?: No  Items Reviewed: Did you receive and understand the discharge instructions provided?: Yes Medications obtained,verified, and reconciled?: Yes (Medications Reviewed) Dietary orders reviewed?: Yes Do you have support at home?: Yes People in Home: parent(s)  Medications Reviewed Today: Medications Reviewed Today     Reviewed by Karena Addison, LPN (Licensed Practical Nurse) on 10/01/22 at 1453  Med List Status: <None>   Medication Order Taking? Sig Documenting Provider Last Dose Status Informant  acetaminophen (TYLENOL) 325 MG tablet [id number]  Take 1-2 tablets (325-650 mg total) by mouth every 4 (four) hours as needed for mild pain. Jacquelynn Cree, PA-C  Active   ascorbic acid (VITAMIN C) 1000 MG tablet [id number]  Take 1 tablet (1,000 mg total) by mouth daily. Jacquelynn Cree, PA-C  Active   buPROPion (WELLBUTRIN XL) 300 MG 24 hr tablet [id number] No Take 300 mg by mouth every morning. [provider] 09/16/2022 0800 Active Self, Pharmacy Records  cyclobenzaprine (FLEXERIL) 5 MG tablet [id number]  Take 1 tablet (5 mg total) by mouth 3 (three) times daily. Jacquelynn Cree, PA-C  Active   docusate sodium (COLACE) 100 MG capsule [id number]  Take 1 capsule (100 mg total) by mouth 2 (two) times daily. Jacquelynn Cree, PA-C  Active   doxycycline (VIBRA-TABS) 100 MG tablet [id number]  Take 1 tablet (100 mg total) by mouth 2 (two) times daily. Nadara Mustard, MD  Active   doxylamine, Sleep, (UNISOM) 25 MG tablet [id number] No Take 25 mg by mouth at bedtime as needed for sleep. [provider] 09/15/2022 Active Self, Pharmacy Records  DULoxetine (CYMBALTA) 60 MG capsule [id number]  Take 2 capsules (120 mg total) by mouth daily. Jacquelynn Cree, PA-C  Active   gabapentin (NEURONTIN) 300 MG capsule [id number]  Take 2 capsules (600 mg total) by mouth 3 (three) times daily. Jacquelynn Cree, PA-C  Active   lisinopril (ZESTRIL) 20 MG tablet [id number]  Take 1 tablet (20 mg total) by mouth daily. Jerene Pitch  Active   NUVIGIL 200 MG TABS [id number] No Take 1 tablet by mouth every morning. [provider] Past Week Active Self, Pharmacy Records  Oxycodone HCl 10 MG TABS [id number]  Take 1.5 tablets (15 mg total) by mouth every 6 (six) hours as needed. Need to taper down to one pill four times a day over next 1-2 weeks. Jacquelynn Cree, PA-C  Active   pantoprazole (PROTONIX) 40 MG tablet [id number] No Take 1 tablet (40 mg total) by mouth 2 (two) times daily. [name], FNP 09/16/2022 0700 Active Self, Pharmacy Records  traZODone (DESYREL) 50 MG tablet [id number]  Take 1 tablet (50 mg total) by mouth at bedtime as needed for sleep. Fanny Dance, MD  Active   VRAYLAR 1.5 MG capsule [id number] No Take 1.5 mg by mouth daily. [provider] 09/15/2022 Active Self, Pharmacy Records  Zinc Sulfate 220 (50 Zn) MG TABS [id number]  Take 1 tablet (220 mg total) by mouth daily. Jacquelynn Cree, PA-C  Active             Home Care and Equipment/Supplies: Were Home Health Services Ordered?: Yes Name of Home Health Agency:: unknown Has Agency set up a time to come to your home?: Yes First Home Health Visit Date: 09/30/22 Any new equipment or medical supplies ordered?: Yes Name of Medical supply agency?: unknown Were you able to get the equipment/medical supplies?: Yes Do you have any questions related to the use of the  equipment/supplies?: No  Functional Questionnaire: Do you need assistance with bathing/showering or dressing?: Yes Do you need assistance with meal preparation?: Yes Do you need assistance with eating?: No Do you have difficulty maintaining continence: No Do you need assistance with getting out of bed/getting out of a chair/moving?: Yes Do you have difficulty managing or taking your medications?: No  Follow up appointments reviewed: PCP Follow-up appointment confirmed?: No (declined) MD Provider Line Number:[phone] Given: No Specialist Hospital Follow-up appointment confirmed?: Yes Date of Specialist follow-up appointment?: 09/30/22 Follow-Up Specialty Provider:: Dr Lajoyce Corners Do you need transportation to your follow-up appointment?: No Do you understand care options if your condition(s) worsen?: Yes-patient verbalized understanding    SIGNATURE Karena Addison, LPN [facility] Nurse Health Advisor Direct Dial [phone]

## 2022-10-02 ENCOUNTER — Telehealth: Payer: Self-pay | Admitting: Orthopedic Surgery

## 2022-10-02 NOTE — Telephone Encounter (Signed)
Patient informed of the onetime refill of Trazodone. Patient advised to call her PCP for refills. Marland Kitchen

## 2022-10-02 NOTE — Telephone Encounter (Signed)
Patient called. Says her leg is warm. LM on the triage phone. Would like to know if she needs to be seen at the ED or need more medication? [phone]

## 2022-10-02 NOTE — Telephone Encounter (Signed)
I called pt adfter speaking with Dr. Lajoyce Corners to advise that the redness and swelling is bc she sustained a fall and that she should continue with her ABX bid and compression and elevation. If she begins to have a temp she should go to the ER

## 2022-10-06 ENCOUNTER — Telehealth: Payer: Self-pay | Admitting: Family

## 2022-10-07 ENCOUNTER — Ambulatory Visit (INDEPENDENT_AMBULATORY_CARE_PROVIDER_SITE_OTHER): Payer: Medicare Other | Admitting: Family

## 2022-10-07 ENCOUNTER — Encounter: Payer: Self-pay | Admitting: Family

## 2022-10-07 ENCOUNTER — Telehealth: Payer: Self-pay | Admitting: Family

## 2022-10-07 DIAGNOSIS — S88111A Complete traumatic amputation at level between knee and ankle, right lower leg, initial encounter: Secondary | ICD-10-CM

## 2022-10-07 DIAGNOSIS — Z89511 Acquired absence of right leg below knee: Secondary | ICD-10-CM

## 2022-10-07 MED ORDER — OXYCODONE HCL 10 MG PO TABS: 10.0000 mg | ORAL_TABLET | Freq: Four times a day (QID) | ORAL | 0 refills | Status: DC | PRN

## 2022-10-07 NOTE — Telephone Encounter (Signed)
This was done while pt was in the office

## 2022-10-07 NOTE — Telephone Encounter (Signed)
VO given for [facility] nursing

## 2022-10-07 NOTE — Telephone Encounter (Signed)
Pt called asking for pain meds to be sent right away. Please send to [facility] on [address] . Pt phone number is [phone]. Spoke wit Denny Peon its done
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
## 2022-10-08 ENCOUNTER — Inpatient Hospital Stay: Payer: Medicare Other | Admitting: Family

## 2022-10-09 ENCOUNTER — Encounter: Payer: Medicare Other | Admitting: Family

## 2022-10-12 ENCOUNTER — Inpatient Hospital Stay: Payer: Medicare Other | Attending: Physician Assistant

## 2022-10-12 ENCOUNTER — Encounter: Payer: Self-pay | Admitting: Family

## 2022-10-13 ENCOUNTER — Inpatient Hospital Stay: Payer: Medicare Other

## 2022-10-13 ENCOUNTER — Ambulatory Visit (INDEPENDENT_AMBULATORY_CARE_PROVIDER_SITE_OTHER): Payer: Medicare Other | Admitting: Orthopedic Surgery

## 2022-10-13 DIAGNOSIS — S88111A Complete traumatic amputation at level between knee and ankle, right lower leg, initial encounter: Secondary | ICD-10-CM

## 2022-10-13 MED ORDER — LEVOFLOXACIN 750 MG PO TABS
750.0000 mg | ORAL_TABLET | Freq: Every day | ORAL | 0 refills | Status: DC
Start: 2022-10-13 — End: 2022-10-18

## 2022-10-14 ENCOUNTER — Inpatient Hospital Stay: Payer: Medicare Other

## 2022-10-15 ENCOUNTER — Encounter (HOSPITAL_COMMUNITY): Payer: Self-pay

## 2022-10-15 ENCOUNTER — Ambulatory Visit (INDEPENDENT_AMBULATORY_CARE_PROVIDER_SITE_OTHER): Payer: Medicare Other

## 2022-10-15 ENCOUNTER — Emergency Department (HOSPITAL_COMMUNITY): Payer: Medicare Other

## 2022-10-15 ENCOUNTER — Telehealth: Payer: Self-pay | Admitting: Orthopedic Surgery

## 2022-10-15 ENCOUNTER — Other Ambulatory Visit: Payer: Self-pay

## 2022-10-15 ENCOUNTER — Inpatient Hospital Stay (HOSPITAL_COMMUNITY)
Admission: EM | Admit: 2022-10-15 | Discharge: 2022-10-20 | DRG: 464 | Disposition: A | Discharge status: Home Health | Payer: Medicare Other | Attending: Internal Medicine | Admitting: Internal Medicine

## 2022-10-15 DIAGNOSIS — Z8249 Family history of ischemic heart disease and other diseases of the circulatory system: Secondary | ICD-10-CM

## 2022-10-15 DIAGNOSIS — Z8616 Personal history of COVID-19: Secondary | ICD-10-CM

## 2022-10-15 DIAGNOSIS — Z79899 Other long term (current) drug therapy: Secondary | ICD-10-CM

## 2022-10-15 DIAGNOSIS — Z87891 Personal history of nicotine dependence: Secondary | ICD-10-CM

## 2022-10-15 DIAGNOSIS — T8149XA Infection following a procedure, other surgical site, initial encounter: Principal | ICD-10-CM

## 2022-10-15 DIAGNOSIS — M79604 Pain in right leg: Secondary | ICD-10-CM | POA: Diagnosis not present

## 2022-10-15 DIAGNOSIS — Z6841 Body Mass Index (BMI) 40.0 and over, adult: Secondary | ICD-10-CM | POA: Diagnosis not present

## 2022-10-15 DIAGNOSIS — G4730 Sleep apnea, unspecified: Secondary | ICD-10-CM | POA: Diagnosis present

## 2022-10-15 DIAGNOSIS — G4700 Insomnia, unspecified: Secondary | ICD-10-CM | POA: Diagnosis not present

## 2022-10-15 DIAGNOSIS — F419 Anxiety disorder, unspecified: Secondary | ICD-10-CM | POA: Diagnosis present

## 2022-10-15 DIAGNOSIS — Z825 Family history of asthma and other chronic lower respiratory diseases: Secondary | ICD-10-CM | POA: Diagnosis not present

## 2022-10-15 DIAGNOSIS — M797 Fibromyalgia: Secondary | ICD-10-CM | POA: Diagnosis present

## 2022-10-15 DIAGNOSIS — G5603 Carpal tunnel syndrome, bilateral upper limbs: Secondary | ICD-10-CM | POA: Diagnosis present

## 2022-10-15 DIAGNOSIS — D62 Acute posthemorrhagic anemia: Secondary | ICD-10-CM | POA: Diagnosis not present

## 2022-10-15 DIAGNOSIS — Q231 Congenital insufficiency of aortic valve: Secondary | ICD-10-CM | POA: Diagnosis not present

## 2022-10-15 DIAGNOSIS — Z4781 Encounter for orthopedic aftercare following surgical amputation: Secondary | ICD-10-CM

## 2022-10-15 DIAGNOSIS — K219 Gastro-esophageal reflux disease without esophagitis: Secondary | ICD-10-CM | POA: Diagnosis present

## 2022-10-15 DIAGNOSIS — T8781 Dehiscence of amputation stump: Secondary | ICD-10-CM | POA: Diagnosis not present

## 2022-10-15 DIAGNOSIS — J45909 Unspecified asthma, uncomplicated: Secondary | ICD-10-CM

## 2022-10-15 DIAGNOSIS — G8929 Other chronic pain: Secondary | ICD-10-CM

## 2022-10-15 DIAGNOSIS — Z808 Family history of malignant neoplasm of other organs or systems: Secondary | ICD-10-CM

## 2022-10-15 DIAGNOSIS — R52 Pain, unspecified: Secondary | ICD-10-CM | POA: Diagnosis not present

## 2022-10-15 DIAGNOSIS — F319 Bipolar disorder, unspecified: Secondary | ICD-10-CM | POA: Diagnosis present

## 2022-10-15 DIAGNOSIS — R296 Repeated falls: Secondary | ICD-10-CM | POA: Diagnosis present

## 2022-10-15 DIAGNOSIS — E876 Hypokalemia: Secondary | ICD-10-CM

## 2022-10-15 DIAGNOSIS — F4310 Post-traumatic stress disorder, unspecified: Secondary | ICD-10-CM | POA: Diagnosis present

## 2022-10-15 DIAGNOSIS — Z833 Family history of diabetes mellitus: Secondary | ICD-10-CM

## 2022-10-15 DIAGNOSIS — G8918 Other acute postprocedural pain: Secondary | ICD-10-CM | POA: Diagnosis not present

## 2022-10-15 DIAGNOSIS — I10 Essential (primary) hypertension: Secondary | ICD-10-CM

## 2022-10-15 DIAGNOSIS — Y835 Amputation of limb(s) as the cause of abnormal reaction of the patient, or of later complication, without mention of misadventure at the time of the procedure: Secondary | ICD-10-CM | POA: Diagnosis present

## 2022-10-15 DIAGNOSIS — T8743 Infection of amputation stump, right lower extremity: Secondary | ICD-10-CM | POA: Diagnosis present

## 2022-10-15 DIAGNOSIS — Z91048 Other nonmedicinal substance allergy status: Secondary | ICD-10-CM

## 2022-10-15 DIAGNOSIS — M419 Scoliosis, unspecified: Secondary | ICD-10-CM

## 2022-10-15 DIAGNOSIS — G548 Other nerve root and plexus disorders: Secondary | ICD-10-CM | POA: Diagnosis not present

## 2022-10-15 DIAGNOSIS — R5381 Other malaise: Secondary | ICD-10-CM | POA: Diagnosis not present

## 2022-10-15 DIAGNOSIS — D649 Anemia, unspecified: Secondary | ICD-10-CM

## 2022-10-15 DIAGNOSIS — G43909 Migraine, unspecified, not intractable, without status migrainosus: Secondary | ICD-10-CM

## 2022-10-15 DIAGNOSIS — T8130XA Disruption of wound, unspecified, initial encounter: Secondary | ICD-10-CM | POA: Diagnosis not present

## 2022-10-15 DIAGNOSIS — M7989 Other specified soft tissue disorders: Secondary | ICD-10-CM | POA: Diagnosis not present

## 2022-10-15 DIAGNOSIS — M5416 Radiculopathy, lumbar region: Secondary | ICD-10-CM

## 2022-10-15 DIAGNOSIS — M479 Spondylosis, unspecified: Secondary | ICD-10-CM

## 2022-10-15 DIAGNOSIS — S8991XA Unspecified injury of right lower leg, initial encounter: Secondary | ICD-10-CM | POA: Diagnosis not present

## 2022-10-15 DIAGNOSIS — F39 Unspecified mood [affective] disorder: Secondary | ICD-10-CM

## 2022-10-15 LAB — URINALYSIS, W/ REFLEX TO CULTURE (INFECTION SUSPECTED)
Bilirubin Urine: NEGATIVE
Glucose, UA: NEGATIVE mg/dL
Hgb urine dipstick: NEGATIVE
Ketones, ur: NEGATIVE mg/dL
Nitrite: NEGATIVE
Protein, ur: NEGATIVE mg/dL
Specific Gravity, Urine: 1.009 (ref 1.005–1.030)
pH: 5 (ref 5.0–8.0)

## 2022-10-15 LAB — CBC WITH DIFFERENTIAL/PLATELET
Abs Immature Granulocytes: 0.01 10*3/uL (ref 0.00–0.07)
Basophils Absolute: 0 10*3/uL (ref 0.0–0.1)
Basophils Relative: 1 %
Eosinophils Absolute: 0.5 10*3/uL (ref 0.0–0.5)
Eosinophils Relative: 7 %
HCT: 34 % — ABNORMAL LOW (ref 36.0–46.0)
Hemoglobin: 10.6 g/dL — ABNORMAL LOW (ref 12.0–15.0)
Immature Granulocytes: 0 %
Lymphocytes Relative: 30 %
Lymphs Abs: 2.2 10*3/uL (ref 0.7–4.0)
MCH: 29.9 pg (ref 26.0–34.0)
MCHC: 31.2 g/dL (ref 30.0–36.0)
MCV: 96 fL (ref 80.0–100.0)
Monocytes Absolute: 0.5 10*3/uL (ref 0.1–1.0)
Monocytes Relative: 7 %
Neutro Abs: 4.2 10*3/uL (ref 1.7–7.7)
Neutrophils Relative %: 55 %
Platelets: 244 10*3/uL (ref 150–400)
RBC: 3.54 MIL/uL — ABNORMAL LOW (ref 3.87–5.11)
RDW: 13.3 % (ref 11.5–15.5)
WBC: 7.4 10*3/uL (ref 4.0–10.5)
nRBC: 0 % (ref 0.0–0.2)

## 2022-10-15 LAB — COMPREHENSIVE METABOLIC PANEL
ALT: 20 U/L (ref 0–44)
AST: 16 U/L (ref 15–41)
Albumin: 3.4 g/dL — ABNORMAL LOW (ref 3.5–5.0)
Alkaline Phosphatase: 76 U/L (ref 38–126)
Anion gap: 8 (ref 5–15)
BUN: 15 mg/dL (ref 6–20)
CO2: 25 mmol/L (ref 22–32)
Calcium: 9 mg/dL (ref 8.9–10.3)
Chloride: 102 mmol/L (ref 98–111)
Creatinine, Ser: 1.1 mg/dL — ABNORMAL HIGH (ref 0.44–1.00)
GFR, Estimated: >60 mL/min (ref >60)
Glucose, Bld: 108 mg/dL — ABNORMAL HIGH (ref 70–99)
Potassium: 4.4 mmol/L (ref 3.5–5.1)
Sodium: 135 mmol/L (ref 135–145)
Total Bilirubin: 0.5 mg/dL (ref 0.3–1.2)
Total Protein: 6.6 g/dL (ref 6.5–8.1)

## 2022-10-15 LAB — I-STAT BETA HCG BLOOD, ED (MC, WL, AP ONLY): I-stat hCG, quantitative: <5 m[IU]/mL (ref <5)

## 2022-10-15 LAB — CULTURE, BLOOD (ROUTINE X 2): Special Requests: ADEQUATE

## 2022-10-15 LAB — PROTIME-INR
INR: 1 (ref 0.8–1.2)
Prothrombin Time: 13.5 seconds (ref 11.4–15.2)

## 2022-10-15 MED ORDER — VANCOMYCIN HCL 1500 MG/300ML IV SOLN
1500.0000 mg | Freq: Once | INTRAVENOUS | Status: DC
  Filled 2022-10-15: qty 300

## 2022-10-15 MED ORDER — VANCOMYCIN HCL 2000 MG/400ML IV SOLN
2000.0000 mg | Freq: Once | INTRAVENOUS | Status: AC
  Administered 2022-10-16: 2000 mg via INTRAVENOUS
  Filled 2022-10-15: qty 400

## 2022-10-15 MED ORDER — OXYCODONE-ACETAMINOPHEN 5-325 MG PO TABS
1.0000 | ORAL_TABLET | Freq: Once | ORAL | Status: AC
  Administered 2022-10-15: 1 via ORAL
  Filled 2022-10-15: qty 1

## 2022-10-15 MED ORDER — LACTATED RINGERS IV BOLUS
1000.0000 mL | Freq: Once | INTRAVENOUS | Status: AC
  Administered 2022-10-15: 1000 mL via INTRAVENOUS

## 2022-10-15 MED ORDER — OXYCODONE HCL 5 MG PO TABS
5.0000 mg | ORAL_TABLET | ORAL | Status: DC | PRN
  Administered 2022-10-15 – 2022-10-16 (×4): 10 mg via ORAL
  Filled 2022-10-15 (×4): qty 2

## 2022-10-15 MED ORDER — SODIUM CHLORIDE 0.9 % IV SOLN
2.0000 g | Freq: Three times a day (TID) | INTRAVENOUS | Status: DC
  Administered 2022-10-15 – 2022-10-19 (×11): 2 g via INTRAVENOUS
  Filled 2022-10-15 (×11): qty 12.5
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
## 2022-10-15 NOTE — Telephone Encounter (Signed)
I spoke with Dr. Lajoyce Corners this pt has had multiple falls on her BKA and was in the office Tuesday. Had started abx that day but given additional trauma to limb advised pt to go to [facility] for admission. They will start on IV ABX and Dr. Lajoyce Corners will come and see the pt and post for revision BKA surgery tomorrow. Pt voiced understanding. I will hold this message and monitor for when the pt arrives in the ER.

## 2022-10-15 NOTE — Anesthesia Preprocedure Evaluation (Addendum)
Anesthesia Evaluation  Patient identified by MRN, date of birth, ID band Patient awake    Reviewed: Allergy & Precautions, NPO status , Patient's Chart, lab work & pertinent test results  History of Anesthesia Complications Negative for: history of anesthetic complications  Airway Mallampati: III  TM Distance: >3 FB Neck ROM: Full    Dental  (+) Dental Advisory Given   Pulmonary neg shortness of breath, asthma (childhood) , sleep apnea (does not use CPAP) , neg COPD, neg recent URI, former smoker   Pulmonary exam normal breath sounds clear to auscultation       Cardiovascular hypertension (lisinopril), Pt. on medications (-) angina (-) Past MI, (-) Cardiac Stents and (-) CABG + dysrhythmias (palpitations) + Valvular Problems/Murmurs (bicuspid aortic valve)  Rhythm:Regular Rate:Normal  TTE 10/11/2018: IMPRESSIONS    1. The left ventricle has normal systolic function, with an ejection  fraction of 55-60%. The cavity size was normal. There is mild concentric  left ventricular hypertrophy. Left ventricular diastolic parameters were  normal. No evidence of left  ventricular regional wall motion abnormalities.   2. The right ventricle has normal systolic function. The cavity was  normal. There is no increase in right ventricular wall thickness.   3. Left atrial size was mildly dilated.   4. The tricuspid valve is grossly normal.   5. The aortic valve is bicuspid. Mild thickening of the aortic valve. No  stenosis.   6. The aortic root is normal in size and structure.     Neuro/Psych  Headaches, neg Seizures PSYCHIATRIC DISORDERS (PTSD, agoraphobia, panic attacks) Anxiety Depression Bipolar Disorder    Neuromuscular disease (lumbar radiculopathy)    GI/Hepatic Neg liver ROS,GERD  Medicated,,  Endo/Other  negative endocrine ROS  Morbid obesity  Renal/GU Renal disease: h/o stones.negative Renal ROS     Musculoskeletal  (+)  Arthritis ,  Fibromyalgia -  Abdominal  (+) + obese  Peds  Hematology  (+) Blood dyscrasia, anemia   Anesthesia Other Findings Patient's right leg is red all the way up her thigh.  Reproductive/Obstetrics                             Anesthesia Physical Anesthesia Plan  ASA: 3  Anesthesia Plan: Regional and MAC   Post-op Pain Management:    Induction: Intravenous  PONV Risk Score and Plan: 2 and Ondansetron, Dexamethasone and Treatment may vary due to age or medical condition  Airway Management Planned: Natural Airway and Simple Face Mask  Additional Equipment:   Intra-op Plan:   Post-operative Plan: Extubation in OR  Informed Consent: I have reviewed the patients History and Physical, chart, labs and discussed the procedure including the risks, benefits and alternatives for the proposed anesthesia with the patient or authorized representative who has indicated his/her understanding and acceptance.     Dental advisory given  Plan Discussed with: CRNA and Anesthesiologist  Anesthesia Plan Comments: (Risks of general anesthesia discussed including, but not limited to, sore throat, hoarse voice, chipped/damaged teeth, injury to vocal cords, nausea and vomiting, allergic reactions, lung infection, heart attack, stroke, and death. All questions answered.  )       Anesthesia Quick Evaluation

## 2022-10-15 NOTE — Telephone Encounter (Signed)
Pt called stating that she feels like her therapy session are not helping her strengthen and she fell 3 times yesterday. She is also asking for a refill of pain medication. She states she is in severe pain from falls. Please call pt about this matter and also send refill to pharmacy on file. Pt phone number is [phone].

## 2022-10-15 NOTE — Telephone Encounter (Signed)
Surgery scheduled and orders in for procedure tomorrow.

## 2022-10-15 NOTE — ED Provider Triage Note (Addendum)
Emergency Medicine Provider Triage Evaluation Note  Taylor [name] , a 32 y.o. female  was evaluated in triage.  Pt complains of right leg pain. 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 Came to the hospital to admission and was directed to the ER. 2 falls at home, no injuries. Right BKA, prior right ankle fusion, failed hardware. Review of Systems  Positive: Drainage Negative: fever  Physical Exam  BP 104/85   Pulse (!) 109   Temp 98.5 F (36.9 C) (Oral)   Resp 20   Wt 120.2 kg   SpO2 98%   BMI 45.49 kg/m  Gen:   Awake, no distress   Resp:  Normal effort  MSK:   Right BKA Other:    Medical Decision Making  Medically screening exam initiated at 2:50 PM.  Appropriate orders placed.  Taylor [name] was informed that the remainder of the evaluation will be completed by another provider, this initial triage assessment does not replace that evaluation, and the importance of remaining in the ED until their evaluation is complete.  Message to Dr. Lajoyce Corners on pt arrival- "She has multiple medical problems, she has fallen on her amputation site 5 times, she has wound dehiscence. Does not need IV antibiotics at this time, can she be admitted to the hospitalist service and I could post her for revision amputation surgery for tomorrow."   Jeannie Fend, PA-C 10/15/22 1452    Jeannie Fend, PA-C 10/15/22 1512

## 2022-10-15 NOTE — Progress Notes (Addendum)
Pharmacy Antibiotic Note  Taylor [name] is a 32 y.o. female admitted on 10/15/2022 with wound infection. 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 Surgical site is painful, red, purulent drainage. She denies fevers, c/o upper back and R. Rib pain due to frequent falls. Pharmacy has been consulted for cefepime dosing. sCr 1.1 (bl~0.5-0.6)  Plan: Cefepime 2g IV every 8 hours Vancomycin 2g IV x1 per MD Monitor renal function while on Abx Monitor WBC, fever, signs of clinical improvement, and surgery plans  Weight: 120.2 kg (265 lb)  Temp (24hrs), Avg:98.2 F (36.8 C), Min:97.9 F (36.6 C), Max:98.5 F (36.9 C)  Recent Labs  Lab 10/15/22 1526  WBC 7.4  CREATININE 1.10*    Estimated Creatinine Clearance: 92 mL/min (A) (by C-G formula based on SCr of 1.1 mg/dL (H)).    Allergies  Allergen Reactions   Other Itching    Animal Dander    Antimicrobials this admission: cefepime 6/6 >>  Vanc 6/7>>  Microbiology results: 6/6 BCx:  6/6 Ucx:  Thank you for allowing pharmacy to be a part of this patient's care.  Arabella Merles, PharmD. [facility] PGY-1 10/15/2022 8:34 PM   ADDENDUM Pharmacy consulted to add Vancomycin maintenance dose.  Plan: Vancomycin 1500 mg IV every 24 hours (AUC 446, Vd 0.5, sCr 1.1)  Thanks,  Arabella Merles, PharmD. [facility] PGY-1 10/16/2022 12:28 AM

## 2022-10-15 NOTE — ED Provider Notes (Signed)
[place] EMERGENCY DEPARTMENT AT [facility] Provider Note   CSN: [id number] Arrival date & time: 10/15/22  1353     History Chief Complaint  Patient presents with   Post-op Problem    HPI Taylor [name] is a 32 y.o. female presenting for swelling redness and pain of her right BKA site.  Per chart review, orthopedic surgery is planning for surgical debridement tomorrow for concern for cellulitis and wound infection.  She has had confusion redness weakness multiple falls.    Patient's recorded medical, surgical, social, medication list and allergies were reviewed in the Snapshot window as part of the initial history.   Review of Systems   Review of Systems  Constitutional:  Positive for chills and fatigue. Negative for fever.  HENT:  Negative for ear pain and sore throat.   Eyes:  Negative for pain and visual disturbance.  Respiratory:  Negative for cough and shortness of breath.   Cardiovascular:  Negative for chest pain and palpitations.  Gastrointestinal:  Negative for abdominal pain and vomiting.  Genitourinary:  Negative for dysuria and hematuria.  Musculoskeletal:  Negative for arthralgias and back pain.  Skin:  Negative for color change and rash.  Neurological:  Negative for seizures and syncope.  All other systems reviewed and are negative.   Physical Exam Updated Vital Signs BP (!) 113/53 (BP Location: Right Arm)   Pulse 88   Temp 97.9 F (36.6 C) (Oral)   Resp 16   Wt 120.2 kg   SpO2 98%   BMI 45.49 kg/m  Physical Exam Vitals and nursing note reviewed.  Constitutional:      General: She is not in acute distress.    Appearance: She is well-developed.  HENT:     Head: Normocephalic and atraumatic.  Eyes:     Conjunctiva/sclera: Conjunctivae normal.  Cardiovascular:     Rate and Rhythm: Normal rate and regular rhythm.     Heart sounds: No murmur heard. Pulmonary:     Effort: Pulmonary effort is normal. No respiratory distress.      Breath sounds: Normal breath sounds.  Abdominal:     General: There is no distension.     Palpations: Abdomen is soft.     Tenderness: There is no abdominal tenderness. There is no right CVA tenderness or left CVA tenderness.  Musculoskeletal:        General: Deformity (Right BKA site grossly erythematous.) present. No swelling or tenderness. Normal range of motion.     Cervical back: Neck supple.  Skin:    General: Skin is warm and dry.  Neurological:     General: No focal deficit present.     Mental Status: She is alert and oriented to person, place, and time. Mental status is at baseline.     Cranial Nerves: No cranial nerve deficit.      ED Course/ Medical Decision Making/ A&P Clinical Course as of 10/15/22 2326  Thu Oct 15, 2022  2059 Refer to Nephrology [CC]    Clinical Course User Index [CC] Glyn Ade, MD    Procedures Procedures   Medications Ordered in ED Medications  ceFEPIme (MAXIPIME) 2 g in sodium chloride 0.9 % 100 mL IVPB (2 g Intravenous New Bag/Given 10/15/22 2203)  vancomycin (VANCOREADY) IVPB 2000 mg/400 mL (has no administration in time range)  oxyCODONE (Oxy IR/ROXICODONE) immediate release tablet 5-10 mg (10 mg Oral Given 10/15/22 2200)  oxyCODONE-acetaminophen (PERCOCET/ROXICET) 5-325 MG per tablet 1 tablet (1 tablet Oral Given 10/15/22 1521)  lactated ringers bolus 1,000 mL (1,000 mLs Intravenous New Bag/Given 10/15/22 2203)    Medical Decision Making:   Patient is presenting with recommendation for medical admission for cellulitis per her orthopedic surgeon.  They have recommended that she be admitted for care and management in the setting of known cellulitis of a recent BKA.  Patient is already posted for operative intervention in the morning.  Broad coverage of wound infection with cefepime and vancomycin. Consulted hospitalist agrees with need for admission.  Blood cultures, screening lab work ordered initiated and currently without acute pathology  detected.     Clinical Impression:  1. Surgical site infection      Admit   Final Clinical Impression(s) / ED Diagnoses Final diagnoses:  Surgical site infection    Rx / DC Orders ED Discharge Orders     None         Glyn Ade, MD 10/15/22 2326

## 2022-10-15 NOTE — ED Triage Notes (Signed)
Pt had R BKA early May; endorses infection and frequent falls at home; states surgical site is painful, red, purulent drainage at site; denies fevers; c/o upper back and R rib pain due to falls; states she was sent by dr for admission and antibiotics

## 2022-10-16 ENCOUNTER — Inpatient Hospital Stay (HOSPITAL_COMMUNITY): Payer: Medicare Other | Admitting: Anesthesiology

## 2022-10-16 ENCOUNTER — Encounter (HOSPITAL_COMMUNITY): Payer: Self-pay | Admitting: Internal Medicine

## 2022-10-16 ENCOUNTER — Encounter (HOSPITAL_COMMUNITY)
Admission: EM | Disposition: A | Discharge status: Home Health | Payer: Self-pay | Source: Home / Self Care | Attending: Internal Medicine

## 2022-10-16 ENCOUNTER — Other Ambulatory Visit: Payer: Self-pay

## 2022-10-16 DIAGNOSIS — Z87891 Personal history of nicotine dependence: Secondary | ICD-10-CM

## 2022-10-16 DIAGNOSIS — J45909 Unspecified asthma, uncomplicated: Secondary | ICD-10-CM

## 2022-10-16 DIAGNOSIS — T8743 Infection of amputation stump, right lower extremity: Secondary | ICD-10-CM | POA: Diagnosis not present

## 2022-10-16 DIAGNOSIS — T8781 Dehiscence of amputation stump: Principal | ICD-10-CM

## 2022-10-16 DIAGNOSIS — F39 Unspecified mood [affective] disorder: Secondary | ICD-10-CM

## 2022-10-16 DIAGNOSIS — S8991XA Unspecified injury of right lower leg, initial encounter: Secondary | ICD-10-CM

## 2022-10-16 DIAGNOSIS — I10 Essential (primary) hypertension: Secondary | ICD-10-CM

## 2022-10-16 HISTORY — PX: STUMP REVISION: SHX6102

## 2022-10-16 HISTORY — PX: APPLICATION OF WOUND VAC: SHX5189

## 2022-10-16 LAB — URINE CULTURE

## 2022-10-16 LAB — CBC
HCT: 31.8 % — ABNORMAL LOW (ref 36.0–46.0)
Hemoglobin: 10.4 g/dL — ABNORMAL LOW (ref 12.0–15.0)
MCH: 31.5 pg (ref 26.0–34.0)
MCHC: 32.7 g/dL (ref 30.0–36.0)
MCV: 96.4 fL (ref 80.0–100.0)
Platelets: 218 10*3/uL (ref 150–400)
RBC: 3.3 MIL/uL — ABNORMAL LOW (ref 3.87–5.11)
RDW: 13.3 % (ref 11.5–15.5)
WBC: 6.3 10*3/uL (ref 4.0–10.5)
nRBC: 0 % (ref 0.0–0.2)

## 2022-10-16 LAB — BASIC METABOLIC PANEL
Anion gap: 15 (ref 5–15)
BUN: 12 mg/dL (ref 6–20)
CO2: 25 mmol/L (ref 22–32)
Calcium: 9.3 mg/dL (ref 8.9–10.3)
Chloride: 98 mmol/L (ref 98–111)
Creatinine, Ser: 0.82 mg/dL (ref 0.44–1.00)
GFR, Estimated: >60 mL/min (ref >60)
Glucose, Bld: 114 mg/dL — ABNORMAL HIGH (ref 70–99)
Potassium: 4.1 mmol/L (ref 3.5–5.1)
Sodium: 138 mmol/L (ref 135–145)

## 2022-10-16 LAB — SURGICAL PCR SCREEN
MRSA, PCR: NEGATIVE
Staphylococcus aureus: NEGATIVE

## 2022-10-16 LAB — AEROBIC/ANAEROBIC CULTURE W GRAM STAIN (SURGICAL/DEEP WOUND)

## 2022-10-16 SURGERY — REVISION, AMPUTATION SITE
Anesthesia: General | Site: Leg Upper | Laterality: Right

## 2022-10-16 MED ORDER — CEFAZOLIN IN SODIUM CHLORIDE 3-0.9 GM/100ML-% IV SOLN: 3.0000 g | INTRAVENOUS | Status: AC

## 2022-10-16 MED ORDER — ONDANSETRON HCL 4 MG/2ML IJ SOLN: 4.0000 mg | Freq: Four times a day (QID) | INTRAMUSCULAR | Status: DC | PRN

## 2022-10-16 MED ORDER — OXYCODONE HCL 5 MG PO TABS
10.0000 mg | ORAL_TABLET | ORAL | Status: DC | PRN
  Administered 2022-10-17 – 2022-10-18 (×7): 15 mg via ORAL
  Administered 2022-10-18: 10 mg via ORAL
  Administered 2022-10-19 – 2022-10-20 (×6): 15 mg via ORAL
  Filled 2022-10-16 (×8): qty 3
  Filled 2022-10-16: qty 2
  Filled 2022-10-16 (×5): qty 3

## 2022-10-16 MED ORDER — POTASSIUM CHLORIDE CRYS ER 20 MEQ PO TBCR: 20.0000 meq | EXTENDED_RELEASE_TABLET | Freq: Every day | ORAL | Status: DC | PRN

## 2022-10-16 MED ORDER — HYDROMORPHONE HCL 1 MG/ML IJ SOLN
0.5000 mg | INTRAMUSCULAR | Status: DC | PRN
  Administered 2022-10-16 – 2022-10-20 (×13): 1 mg via INTRAVENOUS
  Filled 2022-10-16 (×14): qty 1

## 2022-10-16 MED ORDER — DULOXETINE HCL 60 MG PO CPEP
120.0000 mg | ORAL_CAPSULE | Freq: Every day | ORAL | Status: DC
  Administered 2022-10-17 – 2022-10-20 (×4): 120 mg via ORAL
  Filled 2022-10-16 (×4): qty 2

## 2022-10-16 MED ORDER — CARIPRAZINE HCL 1.5 MG PO CAPS
1.5000 mg | ORAL_CAPSULE | Freq: Every day | ORAL | Status: DC
  Administered 2022-10-17 – 2022-10-20 (×4): 1.5 mg via ORAL
  Filled 2022-10-16 (×6): qty 1

## 2022-10-16 MED ORDER — OXYCODONE HCL 5 MG/5ML PO SOLN: 5.0000 mg | Freq: Once | ORAL | Status: DC | PRN

## 2022-10-16 MED ORDER — AMISULPRIDE (ANTIEMETIC) 5 MG/2ML IV SOLN: 10.0000 mg | Freq: Once | INTRAVENOUS | Status: DC | PRN

## 2022-10-16 MED ORDER — VITAMIN C 500 MG PO TABS
1000.0000 mg | ORAL_TABLET | Freq: Every day | ORAL | Status: DC
  Administered 2022-10-16 – 2022-10-20 (×5): 1000 mg via ORAL
  Filled 2022-10-16 (×5): qty 2

## 2022-10-16 MED ORDER — ACETAMINOPHEN 325 MG PO TABS: 650.0000 mg | ORAL_TABLET | Freq: Four times a day (QID) | ORAL | Status: DC | PRN

## 2022-10-16 MED ORDER — OXYCODONE HCL 5 MG PO TABS: 5.0000 mg | ORAL_TABLET | Freq: Once | ORAL | Status: DC | PRN

## 2022-10-16 MED ORDER — TRAZODONE HCL 50 MG PO TABS: 50.0000 mg | ORAL_TABLET | Freq: Every evening | ORAL | Status: DC | PRN

## 2022-10-16 MED ORDER — HYDROMORPHONE HCL 1 MG/ML IJ SOLN
INTRAMUSCULAR | Status: DC | PRN
  Administered 2022-10-16 (×2): .5 mg via INTRAVENOUS

## 2022-10-16 MED ORDER — SODIUM CHLORIDE 0.9 % IV SOLN: INTRAVENOUS | Status: DC

## 2022-10-16 MED ORDER — PROPOFOL 10 MG/ML IV BOLUS
INTRAVENOUS | Status: DC | PRN
  Administered 2022-10-16: 200 mg via INTRAVENOUS
  Administered 2022-10-16: 50 mg via INTRAVENOUS

## 2022-10-16 MED ORDER — POLYETHYLENE GLYCOL 3350 17 G PO PACK: 17.0000 g | PACK | Freq: Every day | ORAL | Status: DC | PRN

## 2022-10-16 MED ORDER — FENTANYL CITRATE (PF) 100 MCG/2ML IJ SOLN
INTRAMUSCULAR | Status: AC
  Filled 2022-10-16: qty 2

## 2022-10-16 MED ORDER — MAGNESIUM CITRATE PO SOLN: 1.0000 | Freq: Once | ORAL | Status: DC | PRN

## 2022-10-16 MED ORDER — LABETALOL HCL 5 MG/ML IV SOLN: 10.0000 mg | INTRAVENOUS | Status: DC | PRN

## 2022-10-16 MED ORDER — METOPROLOL TARTRATE 5 MG/5ML IV SOLN: 2.0000 mg | INTRAVENOUS | Status: DC | PRN

## 2022-10-16 MED ORDER — BISACODYL 5 MG PO TBEC: 5.0000 mg | DELAYED_RELEASE_TABLET | Freq: Every day | ORAL | Status: DC | PRN

## 2022-10-16 MED ORDER — ONDANSETRON HCL 4 MG PO TABS: 4.0000 mg | ORAL_TABLET | Freq: Four times a day (QID) | ORAL | Status: DC | PRN

## 2022-10-16 MED ORDER — LACTATED RINGERS IV SOLN: INTRAVENOUS | Status: DC

## 2022-10-16 MED ORDER — MUPIROCIN 2 % EX OINT
1.0000 | TOPICAL_OINTMENT | Freq: Two times a day (BID) | CUTANEOUS | Status: DC
  Administered 2022-10-16 – 2022-10-20 (×9): 1 via NASAL
  Filled 2022-10-16 (×3): qty 22

## 2022-10-16 MED ORDER — JUVEN PO PACK
1.0000 | PACK | Freq: Two times a day (BID) | ORAL | Status: DC
  Administered 2022-10-17 – 2022-10-20 (×8): 1 via ORAL
  Filled 2022-10-16 (×8): qty 1

## 2022-10-16 MED ORDER — HYDROMORPHONE HCL 1 MG/ML IJ SOLN
INTRAMUSCULAR | Status: AC
  Filled 2022-10-16: qty 0.5

## 2022-10-16 MED ORDER — DOCUSATE SODIUM 100 MG PO CAPS
100.0000 mg | ORAL_CAPSULE | Freq: Every day | ORAL | Status: DC
  Administered 2022-10-17 – 2022-10-20 (×4): 100 mg via ORAL
  Filled 2022-10-16 (×4): qty 1

## 2022-10-16 MED ORDER — PANTOPRAZOLE SODIUM 40 MG PO TBEC: 40.0000 mg | DELAYED_RELEASE_TABLET | Freq: Every day | ORAL | Status: DC

## 2022-10-16 MED ORDER — GABAPENTIN 300 MG PO CAPS
600.0000 mg | ORAL_CAPSULE | Freq: Three times a day (TID) | ORAL | Status: DC
  Administered 2022-10-16 – 2022-10-20 (×11): 600 mg via ORAL
  Filled 2022-10-16 (×12): qty 2

## 2022-10-16 MED ORDER — PHENOL 1.4 % MT LIQD: 1.0000 | OROMUCOSAL | Status: DC | PRN

## 2022-10-16 MED ORDER — ONDANSETRON HCL 4 MG/2ML IJ SOLN
INTRAMUSCULAR | Status: DC | PRN
  Administered 2022-10-16: 4 mg via INTRAVENOUS

## 2022-10-16 MED ORDER — SENNOSIDES-DOCUSATE SODIUM 8.6-50 MG PO TABS: 1.0000 | ORAL_TABLET | Freq: Every evening | ORAL | Status: DC | PRN

## 2022-10-16 MED ORDER — LIDOCAINE 2% (20 MG/ML) 5 ML SYRINGE
INTRAMUSCULAR | Status: DC | PRN
  Administered 2022-10-16: 60 mg via INTRAVENOUS

## 2022-10-16 MED ORDER — 0.9 % SODIUM CHLORIDE (POUR BTL) OPTIME
TOPICAL | Status: DC | PRN
  Administered 2022-10-16: 1000 mL

## 2022-10-16 MED ORDER — ALBUMIN HUMAN 5 % IV SOLN: INTRAVENOUS | Status: DC | PRN

## 2022-10-16 MED ORDER — MAGNESIUM SULFATE 2 GM/50ML IV SOLN: 2.0000 g | Freq: Every day | INTRAVENOUS | Status: DC | PRN

## 2022-10-16 MED ORDER — CHLORHEXIDINE GLUCONATE 4 % EX SOLN
60.0000 mL | Freq: Once | CUTANEOUS | Status: DC
  Filled 2022-10-16 (×2): qty 60

## 2022-10-16 MED ORDER — DEXMEDETOMIDINE HCL IN NACL 80 MCG/20ML IV SOLN
INTRAVENOUS | Status: DC | PRN
  Administered 2022-10-16 (×2): 12 ug via INTRAVENOUS
  Administered 2022-10-16 (×2): 8 ug via INTRAVENOUS

## 2022-10-16 MED ORDER — FENTANYL CITRATE (PF) 100 MCG/2ML IJ SOLN
25.0000 ug | INTRAMUSCULAR | Status: DC | PRN
  Administered 2022-10-16 (×2): 50 ug via INTRAVENOUS

## 2022-10-16 MED ORDER — ZINC SULFATE 220 (50 ZN) MG PO CAPS
220.0000 mg | ORAL_CAPSULE | Freq: Every day | ORAL | Status: DC
  Administered 2022-10-16 – 2022-10-18 (×3): 220 mg via ORAL
  Filled 2022-10-16 (×4): qty 1

## 2022-10-16 MED ORDER — VANCOMYCIN HCL 1500 MG/300ML IV SOLN: 1500.0000 mg | INTRAVENOUS | Status: DC

## 2022-10-16 MED ORDER — PROPOFOL 10 MG/ML IV BOLUS
INTRAVENOUS | Status: AC
  Filled 2022-10-16: qty 20

## 2022-10-16 MED ORDER — FENTANYL CITRATE (PF) 250 MCG/5ML IJ SOLN
INTRAMUSCULAR | Status: DC | PRN
  Administered 2022-10-16: 150 ug via INTRAVENOUS
  Administered 2022-10-16: 100 ug via INTRAVENOUS

## 2022-10-16 MED ORDER — POVIDONE-IODINE 10 % EX SWAB: 2.0000 | Freq: Once | CUTANEOUS | Status: DC

## 2022-10-16 MED ORDER — CHLORHEXIDINE GLUCONATE 0.12 % MT SOLN: 15.0000 mL | Freq: Once | OROMUCOSAL | Status: AC

## 2022-10-16 MED ORDER — KETAMINE HCL-SODIUM CHLORIDE 100-0.9 MG/10ML-% IV SOSY
PREFILLED_SYRINGE | INTRAVENOUS | Status: DC | PRN
  Administered 2022-10-16: 50 mg via INTRAVENOUS

## 2022-10-16 MED ORDER — ORAL CARE MOUTH RINSE: 15.0000 mL | Freq: Once | OROMUCOSAL | Status: AC

## 2022-10-16 MED ORDER — ACETAMINOPHEN 325 MG PO TABS: 325.0000 mg | ORAL_TABLET | Freq: Four times a day (QID) | ORAL | Status: DC | PRN

## 2022-10-16 MED ORDER — MIDAZOLAM HCL 2 MG/2ML IJ SOLN
INTRAMUSCULAR | Status: AC
  Filled 2022-10-16: qty 2

## 2022-10-16 MED ORDER — GUAIFENESIN-DM 100-10 MG/5ML PO SYRP: 15.0000 mL | ORAL_SOLUTION | ORAL | Status: DC | PRN

## 2022-10-16 MED ORDER — MIDAZOLAM HCL 2 MG/2ML IJ SOLN
INTRAMUSCULAR | Status: DC | PRN
  Administered 2022-10-16: 2 mg via INTRAVENOUS

## 2022-10-16 MED ORDER — ACETAMINOPHEN 10 MG/ML IV SOLN
1000.0000 mg | Freq: Once | INTRAVENOUS | Status: DC | PRN
  Administered 2022-10-16: 1000 mg via INTRAVENOUS

## 2022-10-16 MED ORDER — KETAMINE HCL 50 MG/5ML IJ SOSY
PREFILLED_SYRINGE | INTRAMUSCULAR | Status: AC
  Filled 2022-10-16: qty 5

## 2022-10-16 MED ORDER — FENTANYL CITRATE (PF) 250 MCG/5ML IJ SOLN
INTRAMUSCULAR | Status: AC
  Filled 2022-10-16: qty 5

## 2022-10-16 MED ORDER — PANTOPRAZOLE SODIUM 40 MG PO TBEC
40.0000 mg | DELAYED_RELEASE_TABLET | Freq: Two times a day (BID) | ORAL | Status: DC
  Administered 2022-10-16 – 2022-10-20 (×8): 40 mg via ORAL
  Filled 2022-10-16 (×8): qty 1

## 2022-10-16 MED ORDER — LISINOPRIL 20 MG PO TABS
20.0000 mg | ORAL_TABLET | Freq: Every day | ORAL | Status: DC
  Administered 2022-10-17 – 2022-10-18 (×2): 20 mg via ORAL
  Filled 2022-10-16 (×4): qty 1

## 2022-10-16 MED ORDER — CHLORHEXIDINE GLUCONATE 0.12 % MT SOLN
OROMUCOSAL | Status: AC
  Administered 2022-10-16: 15 mL via OROMUCOSAL
  Filled 2022-10-16: qty 15

## 2022-10-16 MED ORDER — ACETAMINOPHEN 10 MG/ML IV SOLN
INTRAVENOUS | Status: AC
  Filled 2022-10-16: qty 100

## 2022-10-16 MED ORDER — BUPROPION HCL ER (XL) 150 MG PO TB24
300.0000 mg | ORAL_TABLET | Freq: Every morning | ORAL | Status: DC
  Administered 2022-10-17 – 2022-10-20 (×4): 300 mg via ORAL
  Filled 2022-10-16 (×4): qty 2

## 2022-10-16 MED ORDER — OXYCODONE HCL 5 MG PO TABS
5.0000 mg | ORAL_TABLET | ORAL | Status: DC | PRN
  Administered 2022-10-18 – 2022-10-20 (×3): 10 mg via ORAL
  Filled 2022-10-16 (×3): qty 2

## 2022-10-16 MED ORDER — ALUM & MAG HYDROXIDE-SIMETH 200-200-20 MG/5ML PO SUSP: 15.0000 mL | ORAL | Status: DC | PRN

## 2022-10-16 MED ORDER — ACETAMINOPHEN 650 MG RE SUPP: 650.0000 mg | Freq: Four times a day (QID) | RECTAL | Status: DC | PRN

## 2022-10-16 MED ORDER — CEFAZOLIN SODIUM-DEXTROSE 2-4 GM/100ML-% IV SOLN
INTRAVENOUS | Status: AC
  Filled 2022-10-16: qty 100

## 2022-10-16 MED ORDER — VANCOMYCIN HCL IN DEXTROSE 1-5 GM/200ML-% IV SOLN
1000.0000 mg | Freq: Two times a day (BID) | INTRAVENOUS | Status: DC
  Administered 2022-10-17 – 2022-10-19 (×5): 1000 mg via INTRAVENOUS
  Filled 2022-10-16 (×8): qty 200

## 2022-10-16 MED ORDER — HYDRALAZINE HCL 20 MG/ML IJ SOLN: 5.0000 mg | INTRAMUSCULAR | Status: DC | PRN

## 2022-10-16 MED ORDER — CYCLOBENZAPRINE HCL 5 MG PO TABS
5.0000 mg | ORAL_TABLET | Freq: Three times a day (TID) | ORAL | Status: DC
  Administered 2022-10-16 – 2022-10-20 (×11): 5 mg via ORAL
  Filled 2022-10-16 (×12): qty 1

## 2022-10-16 SURGICAL SUPPLY — 33 items
BAG COUNTER SPONGE SURGICOUNT (BAG) ×2 IMPLANT
BAG SPNG CNTER NS LX DISP (BAG) ×2
BLADE SAW RECIP 87.9 MT (BLADE) IMPLANT
BLADE SURG 21 STRL SS (BLADE) ×2 IMPLANT
CANISTER WOUND CARE 500ML ATS (WOUND CARE) ×2 IMPLANT
COVER SURGICAL LIGHT HANDLE (MISCELLANEOUS) ×2 IMPLANT
DRAPE EXTREMITY T 121X128X90 (DISPOSABLE) ×2 IMPLANT
DRAPE HALF SHEET 40X57 (DRAPES) ×2 IMPLANT
DRAPE INCISE IOBAN 66X45 STRL (DRAPES) ×2 IMPLANT
DRAPE U-SHAPE 47X51 STRL (DRAPES) ×4 IMPLANT
DRESSING PREVENA PLUS CUSTOM (GAUZE/BANDAGES/DRESSINGS) ×2 IMPLANT
DRSG PREVENA PLUS CUSTOM (GAUZE/BANDAGES/DRESSINGS) ×2
DURAPREP 26ML APPLICATOR (WOUND CARE) ×2 IMPLANT
ELECT REM PT RETURN 9FT ADLT (ELECTROSURGICAL) ×2
ELECTRODE REM PT RTRN 9FT ADLT (ELECTROSURGICAL) ×2 IMPLANT
GLOVE BIOGEL PI IND STRL 9 (GLOVE) ×2 IMPLANT
GLOVE SURG ORTHO 9.0 STRL STRW (GLOVE) ×2 IMPLANT
GOWN STRL REUS W/ TWL XL LVL3 (GOWN DISPOSABLE) ×4 IMPLANT
GOWN STRL REUS W/TWL XL LVL3 (GOWN DISPOSABLE) ×4
GRAFT SKIN WND MICRO 38 (Tissue) IMPLANT
KIT BASIN OR (CUSTOM PROCEDURE TRAY) ×2 IMPLANT
KIT TURNOVER KIT B (KITS) ×2 IMPLANT
MANIFOLD NEPTUNE II (INSTRUMENTS) ×2 IMPLANT
NS IRRIG 1000ML POUR BTL (IV SOLUTION) ×2 IMPLANT
PACK GENERAL/GYN (CUSTOM PROCEDURE TRAY) ×2 IMPLANT
PAD ARMBOARD 7.5X6 YLW CONV (MISCELLANEOUS) ×2 IMPLANT
PREVENA RESTOR ARTHOFORM 46X30 (CANNISTER) ×2 IMPLANT
STAPLER VISISTAT 35W (STAPLE) IMPLANT
SUT ETHILON 2 0 PSLX (SUTURE) ×4 IMPLANT
SUT ETHILON 2 LR (SUTURE) IMPLANT
SUT SILK 2 0 (SUTURE)
SUT SILK 2-0 18XBRD TIE 12 (SUTURE) IMPLANT
TOWEL GREEN STERILE (TOWEL DISPOSABLE) ×2 IMPLANT
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
## 2022-10-16 NOTE — Progress Notes (Signed)
   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 Audrie Lia clinic on 10/13/2022 for postoperative evaluation at which time Levaquin was initiated due to concern for BKA site infection.  She had multiple falls 10/14/2022 with trauma to her wound stump and then reported to the [facility] ER 6/6 for evaluation.  She noted worsening erythema of the wound with purulent yellow discharge.  Consultants:  None  Goals of Care:  Code Status: Full Code   DVT prophylaxis: SCDs  Interim Hx: Afebrile.  Vital signs stable.  Resting comfortably in bed at the time my visit.  Denies chest pain or shortness of breath.  Reports that she is ready for operative correction of her wound.  Assessment & Plan:  Right BKA site infection /dehiscence Dr. Lajoyce Corners following with plan for revision 10/16/2022 -continue broad empiric antibiotic  HTN Blood pressure currently well-controlled  Bipolar disorder -depression/anxiety Continue usual bupropion, duloxetine, Vraylar  Morbid obesity - Body mass index is 45.49 kg/m.   Family Communication: No family present at time of exam Disposition: Will depend upon performance with therapy postoperatively   Objective: Blood pressure (!) 114/59, pulse 79, temperature 98.5 F (36.9 C), temperature source Oral, resp. rate 16, weight 120.2 kg, SpO2 92 %.  Intake/Output Summary (Last 24 hours) at 10/16/2022 1029 Last data filed at 10/16/2022 0634 Gross per 24 hour  Intake 1670.47 ml  Output --  Net 1670.47 ml   Filed Weights   10/15/22 1432  Weight: 120.2 kg    Examination: General: No acute respiratory distress Lungs: Clear to auscultation bilaterally without wheezes or crackles Cardiovascular: Regular rate and rhythm without  murmur gallop or rub normal S1 and S2 Abdomen: Nontender, nondistended, soft, bowel sounds positive, no rebound, no ascites, no appreciable mass Extremities: No significant cyanosis, clubbing, or edema bilateral  CBC: Recent Labs  Lab 10/15/22 1526 10/16/22 0207  WBC 7.4 6.3  NEUTROABS 4.2  --   HGB 10.6* 10.4*  HCT 34.0* 31.8*  MCV 96.0 96.4  PLT 244 218   Basic Metabolic Panel: Recent Labs  Lab 10/15/22 1526 10/16/22 0207  NA 135 138  K 4.4 4.1  CL 102 98  CO2 25 25  GLUCOSE 108* 114*  BUN 15 12  CREATININE 1.10* 0.82  CALCIUM 9.0 9.3   GFR: Estimated Creatinine Clearance: 123.5 mL/min (by C-G formula based on SCr of 0.82 mg/dL).   Scheduled Meds:  buPROPion  300 mg Oral q morning   cariprazine  1.5 mg Oral Daily   cyclobenzaprine  5 mg Oral TID   DULoxetine  120 mg Oral Daily   gabapentin  600 mg Oral TID   lisinopril  20 mg Oral Daily   mupirocin ointment  1 Application Nasal BID   pantoprazole  40 mg Oral BID   Continuous Infusions:  ceFEPime (MAXIPIME) IV 2 g (10/16/22 0454)   vancomycin       LOS: 1 day   Lonia Blood, MD [facility] Office  [phone] Pager - Text Page per Amion  If 7PM-7AM, please contact night-coverage per Amion 10/16/2022, 10:29 AM

## 2022-10-16 NOTE — Progress Notes (Signed)
Pharmacy Antibiotic Note  Taylor [name] is a 32 y.o. female admitted on 10/15/2022 with wound infection. 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 Surgical site is painful, red, purulent drainage.  Pharmacy has been consulted for vancomycin and cefepime dosing.  Renal function improving, afebrile, WBC WNL.  Plan: Change vanc to 1000mg  IV Q12H for AUC 452 using SCr 0.82 Continue cefepime 2g IV Q8H Monitor renal fxn, clinical progress, vanc levels as indicated  Weight: 120.2 kg (265 lb)  Temp (24hrs), Avg:98.1 F (36.7 C), Min:97.9 F (36.6 C), Max:98.5 F (36.9 C)  Recent Labs  Lab 10/15/22 1526 10/16/22 0207  WBC 7.4 6.3  CREATININE 1.10* 0.82     Estimated Creatinine Clearance: 123.5 mL/min (by C-G formula based on SCr of 0.82 mg/dL).    Allergies  Allergen Reactions   Other Itching    Animal Dander    LVQ PTA 6/4 Vanc 6/6 >> Cefepime 6/6 >>  6/7 MRSA PCR - negative 6/6 UCx -   6/6 BCx -   Fatemah Pourciau D. Laney Potash, PharmD, BCPS, BCCCP 10/16/2022, 8:29 AM

## 2022-10-16 NOTE — Anesthesia Procedure Notes (Signed)
Procedure Name: LMA Insertion Date/Time: 10/16/2022 3:43 PM  Performed by: Maxine Glenn, CRNAPre-anesthesia Checklist: Patient identified, Emergency Drugs available, Suction available and Patient being monitored Patient Re-evaluated:Patient Re-evaluated prior to induction Oxygen Delivery Method: Circle System Utilized Preoxygenation: Pre-oxygenation with 100% oxygen Induction Type: IV induction Ventilation: Mask ventilation without difficulty LMA: LMA inserted LMA Size: 4.0 Number of attempts: 1 Airway Equipment and Method: Bite block Placement Confirmation: positive ETCO2 Tube secured with: Tape Dental Injury: Teeth and Oropharynx as per pre-operative assessment

## 2022-10-16 NOTE — Op Note (Signed)
10/16/2022  5:31 PM  PATIENT:  Taylor [name]    PRE-OPERATIVE DIAGNOSIS:  Dehiscence Right Below Knee Amputation  POST-OPERATIVE DIAGNOSIS:  Same  PROCEDURE:  REVISION RIGHT BELOW KNEE AMPUTATION, APPLICATION OF WOUND VAC Application Kerecis micro graft 38 cm.  SURGEON:  Nadara Mustard, MD  PHYSICIAN ASSISTANT: Shela Nevin ANESTHESIA:   General  PREOPERATIVE INDICATIONS:  Taylor [name] is a  32 y.o. female with a diagnosis of Dehiscence Right Below Knee Amputation who failed conservative measures and elected for surgical management.    The risks benefits and alternatives were discussed with the patient preoperatively including but not limited to the risks of infection, bleeding, nerve injury, cardiopulmonary complications, the need for revision surgery, among others, and the patient was willing to proceed.  OPERATIVE IMPLANTS:   Implant Name Type Inv. Item Serial No. Manufacturer Lot No. LRB No. Used Action  GRAFT SKIN WND MICRO 38 - [id number] Tissue GRAFT SKIN WND MICRO 38  [employer] [id number] Right 1 Implanted    @ENCIMAGES @  OPERATIVE FINDINGS: No deep abscess there was nonviable tissue.  Nonviable tissue sent for cultures.  OPERATIVE PROCEDURE: Patient was brought the operating room and underwent a general anesthetic.  After adequate levels anesthesia were obtained patient's right lower extremity was prepped using DuraPrep draped into a sterile field a timeout was called.  A fishmouth incision was made around the area of wound dehiscence this was carried down to bone.  There was superficial tissue over the bone and the bone was not resected.  Patient had significant petechial bleeding this was cauterized with the electrocautery.  The wound was irrigated with normal saline.  The wound bed was filled with 38 cm of Kerecis micro graft to cover a surface area of 200 cm.  #2 and #2-0 nylon was used for wound closure.  The wound closed without tension on the skin.   A customizable and Kathrin Ruddy form wound VAC was applied this had a good suction fit patient was extubated taken the PACU in stable condition.   DISCHARGE PLANNING:  Antibiotic duration: Continue antibiotics adjust antibiotics according to culture sensitivities.  Weightbearing: Strict nonweightbearing on the right patient will need close standby assistance for gait training.  Patient has fallen on the residual limb 5 times at home causing her current condition.  Pain medication: Opioid pathway  Dressing care/ Wound VAC: Wound VAC  Ambulatory devices: Walker with close standby assistance  Discharge to: Discharge planning based on therapy recommendations.  Follow-up: In the office 1 week post operative.

## 2022-10-16 NOTE — Hospital Course (Signed)
Taylor [name] is a 32 y.o. 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 Lajoyce Corners on 10/16/22.

## 2022-10-16 NOTE — Transfer of Care (Signed)
Immediate Anesthesia Transfer of Care Note  Patient: Taylor [name]  Procedure(s) Performed: REVISION RIGHT BELOW KNEE AMPUTATION (Right) APPLICATION OF WOUND VAC (Right: Leg Upper)  Patient Location: PACU  Anesthesia Type:General  Level of Consciousness: awake and alert   Airway & Oxygen Therapy: Patient Spontanous Breathing  Post-op Assessment: Report given to RN and Post -op Vital signs reviewed and stable  Post vital signs: Reviewed and stable  Last Vitals:  Vitals Value Taken Time  BP 128/86 10/16/22 1633  Temp    Pulse 115 10/16/22 1636  Resp 18 10/16/22 1636  SpO2 96 % 10/16/22 1636  Vitals shown include unvalidated device data.  Last Pain:  Vitals:   10/16/22 1458  TempSrc:   PainSc: 0-No pain         Complications: No notable events documented.

## 2022-10-17 ENCOUNTER — Encounter (HOSPITAL_COMMUNITY): Payer: Self-pay | Admitting: Orthopedic Surgery

## 2022-10-17 DIAGNOSIS — T8743 Infection of amputation stump, right lower extremity: Secondary | ICD-10-CM | POA: Diagnosis not present

## 2022-10-17 LAB — URINE CULTURE: Culture: 50000 — AB

## 2022-10-17 LAB — CULTURE, BLOOD (ROUTINE X 2): Culture: NO GROWTH

## 2022-10-17 LAB — CBC
HCT: 27.8 % — ABNORMAL LOW (ref 36.0–46.0)
Hemoglobin: 8.9 g/dL — ABNORMAL LOW (ref 12.0–15.0)
MCH: 30.2 pg (ref 26.0–34.0)
MCHC: 32 g/dL (ref 30.0–36.0)
MCV: 94.2 fL (ref 80.0–100.0)
Platelets: 212 10*3/uL (ref 150–400)
RBC: 2.95 MIL/uL — ABNORMAL LOW (ref 3.87–5.11)
RDW: 13.5 % (ref 11.5–15.5)
WBC: 8.2 10*3/uL (ref 4.0–10.5)
nRBC: 0 % (ref 0.0–0.2)

## 2022-10-17 NOTE — Progress Notes (Signed)
Inpatient Rehab Admissions Coordinator:    CIR consult received. Following for PT/OT recommendations.   Megan Salon, MS, CCC-SLP Rehab Admissions Coordinator  [phone] (celll) [phone] (office)
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
## 2022-10-17 NOTE — Progress Notes (Signed)
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 Audrie Lia clinic on 10/13/2022 for postoperative evaluation at which time Levaquin was initiated due to concern for BKA site infection.  She had multiple falls 10/14/2022 with trauma to her wound stump and then reported to the [facility] ER 6/6 for evaluation.  She noted worsening erythema of the wound with purulent yellow discharge.  Consultants:  None  Goals of Care:  Code Status: Full Code   DVT prophylaxis: SCDs  Interim Hx: The patient underwent revision of her right BKA with application of a wound VAC 10/16/2022.  She is afebrile with stable vital signs.  She reports some flaring of chronic carpal tunnel pain but otherwise has no new complaints at the time my visit.  She tolerated her procedure well and denies significant pain in her extremities.  Assessment & Plan:  Right BKA site infection /dehiscence Dr. Lajoyce Corners carried out revision 10/16/2022 -postoperative care directed by Dr. Lajoyce Corners as follows:  Antibiotic duration: Continue antibiotics adjust antibiotics according to culture sensitivities. Weightbearing: Strict nonweightbearing on the right patient will need close standby assistance for gait training.  Patient has fallen on the residual limb 5 times at home causing her current condition. Dressing care/ Wound VAC: Wound VAC Ambulatory devices: Walker with close standby assistance Discharge to: Discharge planning based on therapy recommendations. Follow-up: In the office 1 week post operative.  HTN Blood pressure currently well-controlled  Bipolar disorder -depression/anxiety Continue usual bupropion, duloxetine, Vraylar  Morbid obesity - Body mass index is 45.49 kg/m.   Family  Communication: No family present at time of exam Disposition: Will depend upon performance with therapy postoperatively   Objective: Blood pressure 105/60, pulse (!) 109, temperature 98.4 F (36.9 C), temperature source Oral, resp. rate 17, weight 120.2 kg, SpO2 97 %.  Intake/Output Summary (Last 24 hours) at 10/17/2022 1017 Last data filed at 10/17/2022 0500 Gross per 24 hour  Intake 2256.96 ml  Output 500 ml  Net 1756.96 ml    Filed Weights   10/15/22 1432  Weight: 120.2 kg    Examination: General: No acute respiratory distress Lungs: Clear to auscultation bilaterally without wheezes or crackles Cardiovascular: Regular rate and rhythm without murmur  Abdomen: Nontender, nondistended, soft, bowel sounds positive, no rebound Extremities: No significant edema - wound VAC in place on surgical site  CBC: Recent Labs  Lab 10/15/22 1526 10/16/22 0207 10/17/22 0046  WBC 7.4 6.3 8.2  NEUTROABS 4.2  --   --   HGB 10.6* 10.4* 8.9*  HCT 34.0* 31.8* 27.8*  MCV 96.0 96.4 94.2  PLT 244 218 212    Basic Metabolic Panel: Recent Labs  Lab 10/15/22 1526 10/16/22 0207  NA 135 138  K 4.4 4.1  CL 102 98  CO2 25 25  GLUCOSE 108* 114*  BUN 15 12  CREATININE 1.10* 0.82  CALCIUM 9.0 9.3    GFR: Estimated Creatinine Clearance: 123.5 mL/min (by C-G formula based on SCr of 0.82 mg/dL).   Scheduled Meds:  vitamin C  1,000 mg Oral Daily   buPROPion  300 mg Oral q morning   cariprazine  1.5 mg Oral Daily   chlorhexidine  60 mL Topical Once   cyclobenzaprine  5 mg Oral TID   docusate sodium  100 mg  Oral Daily   DULoxetine  120 mg Oral Daily   gabapentin  600 mg Oral TID   lisinopril  20 mg Oral Daily   mupirocin ointment  1 Application Nasal BID   nutrition supplement (JUVEN)  1 packet Oral BID BM   pantoprazole  40 mg Oral BID   povidone-iodine  2 Application Topical Once   zinc sulfate  220 mg Oral Daily   Continuous Infusions:  sodium chloride 75 mL/hr at 10/16/22 2052     ceFAZolin (ANCEF) IV     ceFEPime (MAXIPIME) IV 2 g (10/17/22 0414)   magnesium sulfate bolus IVPB     vancomycin 1,000 mg (10/17/22 0121)     LOS: 2 days   Lonia Blood, MD [facility] Office  [phone] Pager - Text Page per Loretha Stapler  If 7PM-7AM, please contact night-coverage per Amion 10/17/2022, 10:17 AM
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
## 2022-10-17 NOTE — Anesthesia Postprocedure Evaluation (Signed)
Anesthesia Post Note  Patient: Taylor [name]  Procedure(s) Performed: REVISION RIGHT BELOW KNEE AMPUTATION (Right) APPLICATION OF WOUND VAC (Right: Leg Upper)     Patient location during evaluation: PACU Anesthesia Type: General Level of consciousness: awake and alert Pain management: pain level controlled Vital Signs Assessment: post-procedure vital signs reviewed and stable Respiratory status: spontaneous breathing, nonlabored ventilation, respiratory function stable and patient connected to nasal cannula oxygen Cardiovascular status: stable and blood pressure returned to baseline Postop Assessment: no apparent nausea or vomiting Anesthetic complications: no   No notable events documented.  Last Vitals:  Vitals:   10/17/22 0457 10/17/22 0810  BP: 108/61 110/67  Pulse: (!) 103 (!) 114  Resp: 18 15  Temp: 37.2 C 36.9 C  SpO2: 97% 92%    Last Pain:  Vitals:   10/17/22 0738  TempSrc:   PainSc: 7                  Shelton Silvas

## 2022-10-18 DIAGNOSIS — T8743 Infection of amputation stump, right lower extremity: Secondary | ICD-10-CM | POA: Diagnosis not present

## 2022-10-18 LAB — AEROBIC/ANAEROBIC CULTURE W GRAM STAIN (SURGICAL/DEEP WOUND)

## 2022-10-18 LAB — CULTURE, BLOOD (ROUTINE X 2): Culture: NO GROWTH

## 2022-10-18 NOTE — Plan of Care (Signed)
  Problem: Clinical Measurements: Goal: Postoperative complications will be avoided or minimized Outcome: Progressing   Problem: Self-Care: Goal: Ability to meet self-care needs will improve Outcome: Progressing   Problem: Pain Management: Goal: Pain level will decrease with appropriate interventions Outcome: Progressing   Problem: Skin Integrity: Goal: Demonstration of wound healing without infection will improve Outcome: Progressing   

## 2022-10-18 NOTE — Progress Notes (Signed)
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 Audrie Lia clinic on 10/13/2022 for postoperative evaluation at which time Levaquin was initiated due to concern for BKA site infection.  She had multiple falls 10/14/2022 with trauma to her wound stump and then reported to the [facility] ER 6/6 for evaluation.  She noted worsening erythema of the wound with purulent yellow discharge.  Consultants:  None  Goals of Care:  Code Status: Full Code   DVT prophylaxis: SCDs  Interim Hx: No acute issues recorded overnight.  Afebrile.  Vital signs stable.  Continues to complain of bilateral carpal tunnel pain, but states that it is improving gradually.  Reports that she has wrist splints at home that she does not typically wear but she did not bring them with her.  Denies chest pain or shortness of breath.  Assessment & Plan:  Right BKA site infection /dehiscence Dr. Lajoyce Corners carried out revision 10/16/2022 - postoperative care directed by Dr. Lajoyce Corners as follows:  Antibiotic duration: Continue antibiotics adjust antibiotics according to culture sensitivities. Weightbearing: Strict nonweightbearing on the right patient will need close standby assistance for gait training.  Patient has fallen on the residual limb 5 times at home causing her current condition. Dressing care/ Wound VAC: Wound VAC Ambulatory devices: Walker with close standby assistance Discharge to: Discharge planning based on therapy recommendations. Follow-up: In the office 1 week post operative.  HTN Blood pressure currently well-controlled  Bipolar disorder -depression/anxiety Continue usual bupropion, duloxetine, Vraylar  Morbid obesity - Body mass index is 45.49 kg/m.   Family Communication:  No family present at time of exam Disposition: Will depend upon performance with therapy postoperatively   Objective: Blood pressure 110/71, pulse 98, temperature 98 F (36.7 C), resp. rate 15, weight 120.2 kg, SpO2 97 %.  Intake/Output Summary (Last 24 hours) at 10/18/2022 1014 Last data filed at 10/18/2022 1009 Gross per 24 hour  Intake 1948.22 ml  Output 2300 ml  Net -351.78 ml    Filed Weights   10/15/22 1432  Weight: 120.2 kg    Examination: General: No acute respiratory distress Lungs: Clear to auscultation bilaterally  Cardiovascular: Regular rate and rhythm without murmur  Abdomen: Nontender, nondistended, soft, bowel sounds positive, no rebound Extremities: No significant edema - wound VAC in place on surgical site  CBC: Recent Labs  Lab 10/15/22 1526 10/16/22 0207 10/17/22 0046  WBC 7.4 6.3 8.2  NEUTROABS 4.2  --   --   HGB 10.6* 10.4* 8.9*  HCT 34.0* 31.8* 27.8*  MCV 96.0 96.4 94.2  PLT 244 218 212    Basic Metabolic Panel: Recent Labs  Lab 10/15/22 1526 10/16/22 0207  NA 135 138  K 4.4 4.1  CL 102 98  CO2 25 25  GLUCOSE 108* 114*  BUN 15 12  CREATININE 1.10* 0.82  CALCIUM 9.0 9.3    GFR: Estimated Creatinine Clearance: 123.5 mL/min (by C-G formula based on SCr of 0.82 mg/dL).   Scheduled Meds:  vitamin C  1,000 mg Oral Daily   buPROPion  300 mg Oral q morning   cariprazine  1.5 mg Oral Daily   chlorhexidine  60 mL Topical Once   cyclobenzaprine  5 mg Oral TID   docusate sodium  100 mg Oral Daily   DULoxetine  120 mg Oral Daily   gabapentin  600 mg Oral TID   lisinopril  20 mg Oral Daily   mupirocin ointment  1 Application Nasal BID   nutrition supplement (JUVEN)  1 packet Oral BID BM   pantoprazole  40 mg Oral BID   povidone-iodine  2 Application Topical Once   zinc sulfate  220 mg Oral Daily   Continuous Infusions:  sodium chloride 75 mL/hr at 10/16/22 2052   ceFEPime (MAXIPIME) IV 2 g (10/18/22 0454)   magnesium sulfate bolus  IVPB     vancomycin 1,000 mg (10/18/22 0145)     LOS: 3 days   Lonia Blood, MD [facility] Office  [phone] Pager - Text Page per Loretha Stapler  If 7PM-7AM, please contact night-coverage per Amion 10/18/2022, 10:14 AM

## 2022-10-19 ENCOUNTER — Other Ambulatory Visit (HOSPITAL_COMMUNITY): Payer: Self-pay

## 2022-10-19 ENCOUNTER — Inpatient Hospital Stay: Payer: Medicare Other | Admitting: Physician Assistant

## 2022-10-19 DIAGNOSIS — T8743 Infection of amputation stump, right lower extremity: Secondary | ICD-10-CM | POA: Diagnosis not present

## 2022-10-19 LAB — CBC
HCT: 25.8 % — ABNORMAL LOW (ref 36.0–46.0)
Hemoglobin: 8.4 g/dL — ABNORMAL LOW (ref 12.0–15.0)
MCH: 31 pg (ref 26.0–34.0)
MCHC: 32.6 g/dL (ref 30.0–36.0)
MCV: 95.2 fL (ref 80.0–100.0)
Platelets: 206 10*3/uL (ref 150–400)
RBC: 2.71 MIL/uL — ABNORMAL LOW (ref 3.87–5.11)
RDW: 13.5 % (ref 11.5–15.5)
WBC: 8.7 10*3/uL (ref 4.0–10.5)
nRBC: 0 % (ref 0.0–0.2)

## 2022-10-19 LAB — BASIC METABOLIC PANEL
Anion gap: 8 (ref 5–15)
BUN: 12 mg/dL (ref 6–20)
CO2: 26 mmol/L (ref 22–32)
Calcium: 8.8 mg/dL — ABNORMAL LOW (ref 8.9–10.3)
Chloride: 102 mmol/L (ref 98–111)
Creatinine, Ser: 0.64 mg/dL (ref 0.44–1.00)
GFR, Estimated: >60 mL/min (ref >60)
Glucose, Bld: 98 mg/dL (ref 70–99)
Potassium: 3.6 mmol/L (ref 3.5–5.1)
Sodium: 136 mmol/L (ref 135–145)

## 2022-10-19 LAB — CULTURE, BLOOD (ROUTINE X 2)

## 2022-10-19 MED ORDER — ENOXAPARIN SODIUM 40 MG/0.4ML IJ SOSY
40.0000 mg | PREFILLED_SYRINGE | Freq: Every day | INTRAMUSCULAR | Status: DC
  Administered 2022-10-19 – 2022-10-20 (×2): 40 mg via SUBCUTANEOUS
  Filled 2022-10-19 (×2): qty 0.4

## 2022-10-19 MED ORDER — SENNOSIDES-DOCUSATE SODIUM 8.6-50 MG PO TABS: 1.0000 | ORAL_TABLET | Freq: Every day | ORAL | Status: DC

## 2022-10-19 MED ORDER — CEFADROXIL 500 MG PO CAPS
500.0000 mg | ORAL_CAPSULE | Freq: Two times a day (BID) | ORAL | Status: DC
  Administered 2022-10-19 – 2022-10-20 (×3): 500 mg via ORAL
  Filled 2022-10-19 (×3): qty 1

## 2022-10-19 MED ORDER — CEFADROXIL 500 MG PO CAPS
500.0000 mg | ORAL_CAPSULE | Freq: Two times a day (BID) | ORAL | 0 refills | Status: DC
  Filled 2022-10-19: qty 36, 18d supply, fill #0

## 2022-10-19 MED ORDER — OXYCODONE HCL 5 MG PO TABS
5.0000 mg | ORAL_TABLET | ORAL | 0 refills | Status: DC | PRN
  Filled 2022-10-19: qty 30, 5d supply, fill #0

## 2022-10-19 NOTE — Progress Notes (Signed)
Inpatient Rehab Admissions Coordinator:    PT/OT rec HH. CIR will sign off.   Megan Salon, MS, CCC-SLP Rehab Admissions Coordinator  [phone] (celll) [phone] (office)

## 2022-10-19 NOTE — Progress Notes (Signed)
Inpatient Rehab Admissions Coordinator:   Discussed with pt's PT from today who feels pt would benefit from CIR stay briefly before returning home.  On d/c from CIR on 5/20 pt was ambulating 35' mod I with RW, and mod I with ADLs.  Dr. Sharon Seller and Dr. Lajoyce Corners in agreement with exploring possible return to CIR.  I will place a consult order so an [facility] can follow up tomorrow.   Estill Dooms, PT, DPT Admissions Coordinator [phone] 10/19/22  4:41 PM

## 2022-10-19 NOTE — Progress Notes (Signed)
Orthopedic Tech Progress Note Patient Details:  [name] [birth year] [id number]  RN called requesting a STUMP SHRINKER for patient. I told her to reach out to MD and get an order for shrinker.  Patient ID: Taylor [name], female   DOB: [birth year], 32 y.o.   MRN: [id number]  [name] 10/19/2022, 4:21 PM

## 2022-10-19 NOTE — Discharge Summary (Signed)
DISCHARGE SUMMARY  Taylor [name]  MR#: [id number]  DOB:Apr 09, 1991  Date of Admission: 10/15/2022 Date of Discharge: 10/19/2022  Attending Physician:Crews Mccollam Silvestre Gunner, MD  Patient's WUJ:WJXBJ, Edilia Bo, FNP  Consults: Dr. Lajoyce Corners   Disposition: D/C home w/ HHPT/OT and home wound VAC  Follow-up Appts:  Follow-up Information     Nadara Mustard, MD Follow up in 1 week(s).   Specialty: Orthopedic Surgery Contact information: [address] [place] [address] [phone]         [name], FNP Follow up.   Specialty: Family Medicine Contact information: [address] [place] [address] [phone]                 Tests Needing Follow-up: -assess surgical wound   Discharge Diagnoses: Right BKA site infection /dehiscence s/p operative revision  HTN Bipolar disorder - depression/anxiety Morbid obesity - Body mass index is 45.49 kg/m.   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 Audrie Lia clinic on 10/13/2022 for postoperative evaluation at which time Levaquin was initiated due to concern for BKA site infection. She had multiple falls 10/14/2022 with trauma to her wound stump and then reported to the [facility] ER 6/6 for evaluation. She noted worsening erythema of the wound with purulent yellow discharge.   Hospital Course:  Right BKA site infection /dehiscence Dr. Lajoyce Corners carried out revision 10/16/2022 - postoperative care directed by Dr. Lajoyce Corners as follows:   Antibiotic duration: Continue antibiotics adjust antibiotics according to culture sensitivities. Weightbearing: Strict nonweightbearing on the right patient will need close standby assistance for gait training.  Patient has fallen on the residual limb 5 times at home causing her current condition. Dressing care/ Wound VAC: Wound  VAC Ambulatory devices: Walker with close standby assistance Discharge to: Discharge planning based on therapy recommendations. Follow-up: In the office 1 week post operative.   Surgical cultures are without growth - blood cultures with no growth - PT/OT recommending home health - pt was not interested in considering SNF for a rehab stay - she was changed to oral antibiotic to complete 18 additional days of coverage - to f/u w/ Dr. Lajoyce Corners for wound check - VAC in place on wound at time of d/c - pt warned to avoid falling on surgical site and to follow activity precautions    HTN Blood pressure currently well-controlled   Bipolar disorder -depression/anxiety Continue usual bupropion, duloxetine, Vraylar   Morbid obesity - Body mass index is 45.49 kg/m.    Allergies as of 10/19/2022       Reactions   Other Itching   Animal Dander        Medication List     STOP taking these medications    Zinc Sulfate 220 (50 Zn) MG Tabs       TAKE these medications    acetaminophen 325 MG tablet Commonly known as: TYLENOL Take 1-2 tablets (325-650 mg total) by mouth every 4 (four) hours as needed for mild pain.   buPROPion 300 MG 24 hr tablet Commonly known as: WELLBUTRIN XL Take 300 mg by mouth every morning.   cefadroxil 500 MG capsule Commonly known as: DURICEF Take 1 capsule (500 mg total) by mouth 2 (two) times daily for 36 doses.   cyclobenzaprine 5 MG tablet Commonly known as: FLEXERIL Take 1 tablet (5 mg total) by mouth 3 (three) times daily.  docusate sodium 100 MG capsule Commonly known as: COLACE Take 1 capsule (100 mg total) by mouth 2 (two) times daily.   doxylamine (Sleep) 25 MG tablet Commonly known as: UNISOM Take 25 mg by mouth at bedtime as needed for sleep.   DULoxetine 60 MG capsule Commonly known as: CYMBALTA Take 2 capsules (120 mg total) by mouth daily.   gabapentin 300 MG capsule Commonly known as: NEURONTIN Take 2 capsules (600 mg total) by  mouth 3 (three) times daily.   lisinopril 20 MG tablet Commonly known as: ZESTRIL Take 1 tablet (20 mg total) by mouth daily.   Nuvigil 200 MG Tabs Generic drug: Armodafinil Take 1 tablet by mouth every morning.   oxyCODONE 5 MG immediate release tablet Commonly known as: Oxy IR/ROXICODONE Take 1-2 tablets (5-10 mg total) by mouth every 4 (four) hours as needed for severe pain.   pantoprazole 40 MG tablet Commonly known as: PROTONIX Take 1 tablet (40 mg total) by mouth 2 (two) times daily.   senna-docusate 8.6-50 MG tablet Commonly known as: Senokot-S Take 1 tablet by mouth at bedtime.   traZODone 50 MG tablet Commonly known as: DESYREL Take 1 tablet (50 mg total) by mouth at bedtime as needed for sleep.   vitamin C 1000 MG tablet Take 1 tablet (1,000 mg total) by mouth daily.   Vraylar 1.5 MG capsule Generic drug: cariprazine Take 1.5 mg by mouth daily.        Day of Discharge BP (!) 97/56 (BP Location: Left Arm)   Pulse 85   Temp 98 F (36.7 C) (Oral)   Resp 17   Wt 120.2 kg   SpO2 97%   BMI 45.49 kg/m   Physical Exam: General: No acute respiratory distress Lungs: Clear to auscultation bilaterally without wheezes or crackles Cardiovascular: Regular rate and rhythm without murmur gallop or rub normal S1 and S2 Abdomen: Nontender, nondistended, soft, bowel sounds positive, no rebound, no ascites, no appreciable mass Extremities: No significant cyanosis, clubbing, or edema bilateral lower extremities  Basic Metabolic Panel: Recent Labs  Lab 10/15/22 1526 10/16/22 0207 10/19/22 0102  NA 135 138 136  K 4.4 4.1 3.6  CL 102 98 102  CO2 25 25 26   GLUCOSE 108* 114* 98  BUN 15 12 12   CREATININE 1.10* 0.82 0.64  CALCIUM 9.0 9.3 8.8*   CBC: Recent Labs  Lab 10/15/22 1526 10/16/22 0207 10/17/22 0046 10/19/22 0102  WBC 7.4 6.3 8.2 8.7  NEUTROABS 4.2  --   --   --   HGB 10.6* 10.4* 8.9* 8.4*  HCT 34.0* 31.8* 27.8* 25.8*  MCV 96.0 96.4 94.2 95.2   PLT 244 218 212 206    Time spent in discharge (includes decision making & examination of pt): 35 minutes  10/19/2022, 2:44 PM   Lonia Blood, MD [facility] Office  [phone]
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
## 2022-10-19 NOTE — TOC Initial Note (Addendum)
Transition of Care (TOC) - Initial/Assessment Note   Spoke to patient at bedside , OT present.   Discussed PT/OT recommendations for home health . Patient asked about inpatient rehab , read their note to patient, they signed off due to PT/OT recommendations for home health. MD had messaged TOC team regarding SNF, patient does not want SNF at this time. OT present and recommends home health services.   Patient from home with father. He is able to provide 24/7 assistance. Patient stated [facility] saw her, however PT/Ot both called but did not come to see her. NCM spoke with Tresa Endo with [facility]. Patinet was admitted to hospital before PT/OT could see her at home. Kelly aware discharge is planned for today. They will see her within 48 hours of discharge. She will need new orders and face to face for HHRN,PT,OT . NCM entered and asked MD to sign.   Patient has a preveena VAC. Hospital nurse will connect pump to dressing prior to discharge. Incisional VAC .   Patient has walker, wheel chair, 3 in 1 , tub bench at home.   1624 PT recommendation now inpatient rehab will need CIR Consult again   Patient Details  Name: Taylor [name] MRN: [id number] Date of Birth: [birth year]  Transition of [facility]) CM/SW Contact:    Kingsley Plan, RN Phone Number: 10/19/2022, 11:28 AM  Clinical Narrative:                   Expected Discharge Plan: Home w Home Health Services Barriers to Discharge: No Barriers Identified   Patient Goals and CMS Choice Patient states their goals for this hospitalization and ongoing recovery are:: to return to home CMS Medicare.gov Compare Post Acute Care list provided to:: Patient Choice offered to / list presented to : Patient [place] ownership interest in [facility].provided to:: Patient    Expected Discharge Plan and Services   Discharge Planning Services: CM Consult Post Acute Care Choice: Home Health Living arrangements for the past 2  months: Apartment                 DME Arranged: N/A         HH Arranged: RN, PT, OT HH Agency: [facility] Date HH Agency Contacted: 10/19/22 Time HH Agency Contacted: 1127 Representative spoke with at [facility] Agency: Tresa Endo  Prior Living Arrangements/Services Living arrangements for the past 2 months: Apartment Lives with:: Parents Patient language and need for interpreter reviewed:: Yes Do you feel safe going back to the place where you live?: Yes      Need for Family Participation in Patient Care: Yes (Comment) Care giver support system in place?: Yes (comment) Current home services: DME Criminal Activity/Legal Involvement Pertinent to Current Situation/Hospitalization: No - Comment as needed  Activities of Daily Living Home Assistive Devices/Equipment: Other (Comment), Walker (specify type) (stump shrinker) ADL Screening (condition at time of admission) Patient's cognitive ability adequate to safely complete daily activities?: Yes Is the patient deaf or have difficulty hearing?: No Does the patient have difficulty seeing, even when wearing glasses/contacts?: No Does the patient have difficulty concentrating, remembering, or making decisions?: No Patient able to express need for assistance with ADLs?: Yes Does the patient have difficulty dressing or bathing?: No Independently performs ADLs?: Yes (appropriate for developmental age) Does the patient have difficulty walking or climbing stairs?: Yes Weakness of Legs: Right Weakness of Arms/Hands: None  Permission Sought/Granted   Permission granted to share information with : No  Emotional Assessment Appearance:: Appears stated age Attitude/Demeanor/Rapport: Engaged Affect (typically observed): Accepting Orientation: : Oriented to Self, Oriented to Place, Oriented to  Time, Oriented to Situation Alcohol / Substance Use: Not Applicable Psych Involvement: No (comment)  Admission diagnosis:  Right BKA  infection (HCC) [T87.43] Surgical site infection [T81.49XA] Patient Active Problem List   Diagnosis Date Noted   Mood disorder (HCC) 10/16/2022   Dehiscence of amputation stump of right lower extremity (HCC) 10/16/2022   Right BKA infection (HCC) 10/15/2022   Phantom pain after amputation of lower extremity (HCC) 09/28/2022   Bipolar I disorder (HCC) 09/23/2022   Panic disorder with agoraphobia and moderate panic attacks 09/23/2022   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, right, sequela (HCC) 09/18/2022   Septic arthritis of right ankle (HCC) 09/16/2022   S/P BKA (below knee amputation) unilateral, right (HCC) 09/16/2022   Charcot ankle, 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   Osteomyelitis of right ankle (HCC) 09/12/2022   Acute osteomyelitis of right foot (HCC) 09/09/2022   HTN (hypertension) 09/09/2022   H/O ankle fusion 03/11/2022   Prolapsed internal hemorrhoids, grade 2 09/16/2021   Displaced pilon fracture of right tibia, initial encounter for closed fracture    Lymphocytic colitis 05/16/2021   Bloating 11/28/2020   Incontinence of feces with fecal urgency 11/15/2020   Sprain of anterior talofibular ligament of right ankle    Impingement of right ankle joint    PTSD (post-traumatic stress disorder) 10/04/2020   Migraine without aura and without status migrainosus, not intractable 10/04/2020   Hemorrhoids 01/30/2020   Leukocytosis 09/20/2019   Pain in left foot 09/14/2019   Chronic depression 08/04/2019   Chronic back pain 08/04/2019   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HCC) 08/04/2019   Lumbar radiculopathy 08/04/2019   IUD (intrauterine device) in place 04/27/2019   Morbid obesity (HCC) 01/23/2019   H/O Abnormal LFTs 10/17/2018   Bicuspid aortic valve    GAD (generalized anxiety disorder) 06/30/2018   GERD (gastroesophageal reflux disease) 06/06/2018   Current smoker 06/06/2018   Depression, major, single episode, moderate (HCC) 06/06/2018   PCP:  [name],  FNP Pharmacy:   [facility] [phone] - [place], [place] - [address] [place] [address] Phone: [phone] Fax: [phone]  [facility] [address] [place] [address] Phone: [phone] Fax: [phone]     Social Determinants of Health (SDOH) Social History: SDOH Screenings   Food Insecurity: No Food Insecurity (10/16/2022)  Housing: Low Risk  (10/16/2022)  Transportation Needs: No Transportation Needs (10/16/2022)  Utilities: Not At Risk (10/16/2022)  Depression (PHQ2-9): High Risk (05/25/2022)  Financial Resource Strain: Low Risk  (03/25/2019)  Tobacco Use: Medium Risk (10/17/2022)   SDOH Interventions:     Readmission Risk Interventions     No data to display

## 2022-10-19 NOTE — Care Management Important Message (Signed)
Important Message  Patient Details  Name: Taylor [name] MRN: [id number] Date of Birth: 06/25/1990   Medicare Important Message Given:  Yes     Sherilyn Banker 10/19/2022, 2:55 PM

## 2022-10-20 ENCOUNTER — Inpatient Hospital Stay (HOSPITAL_COMMUNITY)
Admission: RE | Admit: 2022-10-20 | Discharge: 2022-10-27 | DRG: 560 | Disposition: A | Discharge status: Home Health | Payer: Medicare Other | Source: Intra-hospital | Attending: Physical Medicine & Rehabilitation | Admitting: Physical Medicine & Rehabilitation

## 2022-10-20 ENCOUNTER — Other Ambulatory Visit: Payer: Self-pay

## 2022-10-20 ENCOUNTER — Encounter: Payer: Medicare Other | Admitting: Orthopedic Surgery

## 2022-10-20 ENCOUNTER — Encounter (HOSPITAL_COMMUNITY): Payer: Self-pay | Admitting: Physical Medicine & Rehabilitation

## 2022-10-20 DIAGNOSIS — Z9181 History of falling: Secondary | ICD-10-CM

## 2022-10-20 DIAGNOSIS — R296 Repeated falls: Secondary | ICD-10-CM | POA: Diagnosis present

## 2022-10-20 DIAGNOSIS — Z87891 Personal history of nicotine dependence: Secondary | ICD-10-CM

## 2022-10-20 DIAGNOSIS — Z8616 Personal history of COVID-19: Secondary | ICD-10-CM

## 2022-10-20 DIAGNOSIS — Z4781 Encounter for orthopedic aftercare following surgical amputation: Principal | ICD-10-CM

## 2022-10-20 DIAGNOSIS — T8743 Infection of amputation stump, right lower extremity: Secondary | ICD-10-CM | POA: Diagnosis not present

## 2022-10-20 DIAGNOSIS — I10 Essential (primary) hypertension: Secondary | ICD-10-CM | POA: Diagnosis present

## 2022-10-20 DIAGNOSIS — G4700 Insomnia, unspecified: Secondary | ICD-10-CM | POA: Diagnosis present

## 2022-10-20 DIAGNOSIS — Z825 Family history of asthma and other chronic lower respiratory diseases: Secondary | ICD-10-CM | POA: Diagnosis not present

## 2022-10-20 DIAGNOSIS — G546 Phantom limb syndrome with pain: Secondary | ICD-10-CM | POA: Diagnosis present

## 2022-10-20 DIAGNOSIS — M797 Fibromyalgia: Secondary | ICD-10-CM | POA: Diagnosis present

## 2022-10-20 DIAGNOSIS — Z79899 Other long term (current) drug therapy: Secondary | ICD-10-CM | POA: Diagnosis not present

## 2022-10-20 DIAGNOSIS — J45909 Unspecified asthma, uncomplicated: Secondary | ICD-10-CM | POA: Diagnosis present

## 2022-10-20 DIAGNOSIS — G5603 Carpal tunnel syndrome, bilateral upper limbs: Secondary | ICD-10-CM | POA: Diagnosis present

## 2022-10-20 DIAGNOSIS — M7989 Other specified soft tissue disorders: Secondary | ICD-10-CM | POA: Diagnosis present

## 2022-10-20 DIAGNOSIS — F319 Bipolar disorder, unspecified: Secondary | ICD-10-CM | POA: Diagnosis present

## 2022-10-20 DIAGNOSIS — R609 Edema, unspecified: Secondary | ICD-10-CM | POA: Diagnosis not present

## 2022-10-20 DIAGNOSIS — F419 Anxiety disorder, unspecified: Secondary | ICD-10-CM | POA: Diagnosis present

## 2022-10-20 DIAGNOSIS — F4310 Post-traumatic stress disorder, unspecified: Secondary | ICD-10-CM | POA: Diagnosis present

## 2022-10-20 DIAGNOSIS — T8130XA Disruption of wound, unspecified, initial encounter: Secondary | ICD-10-CM | POA: Diagnosis not present

## 2022-10-20 DIAGNOSIS — Z981 Arthrodesis status: Secondary | ICD-10-CM | POA: Diagnosis not present

## 2022-10-20 DIAGNOSIS — E876 Hypokalemia: Secondary | ICD-10-CM | POA: Diagnosis not present

## 2022-10-20 DIAGNOSIS — D62 Acute posthemorrhagic anemia: Secondary | ICD-10-CM | POA: Diagnosis present

## 2022-10-20 DIAGNOSIS — Z8249 Family history of ischemic heart disease and other diseases of the circulatory system: Secondary | ICD-10-CM

## 2022-10-20 DIAGNOSIS — T8142XA Infection following a procedure, deep incisional surgical site, initial encounter: Secondary | ICD-10-CM | POA: Diagnosis present

## 2022-10-20 DIAGNOSIS — Z833 Family history of diabetes mellitus: Secondary | ICD-10-CM

## 2022-10-20 DIAGNOSIS — R600 Localized edema: Secondary | ICD-10-CM | POA: Diagnosis not present

## 2022-10-20 DIAGNOSIS — R5381 Other malaise: Secondary | ICD-10-CM | POA: Diagnosis present

## 2022-10-20 DIAGNOSIS — Z6841 Body Mass Index (BMI) 40.0 and over, adult: Secondary | ICD-10-CM | POA: Diagnosis not present

## 2022-10-20 LAB — AEROBIC/ANAEROBIC CULTURE W GRAM STAIN (SURGICAL/DEEP WOUND)

## 2022-10-20 LAB — CULTURE, BLOOD (ROUTINE X 2): Special Requests: ADEQUATE

## 2022-10-20 MED ORDER — BUPROPION HCL ER (XL) 300 MG PO TB24
300.0000 mg | ORAL_TABLET | Freq: Every morning | ORAL | Status: DC
  Administered 2022-10-21 – 2022-10-27 (×7): 300 mg via ORAL
  Filled 2022-10-20 (×7): qty 1

## 2022-10-20 MED ORDER — ACETAMINOPHEN 325 MG PO TABS
325.0000 mg | ORAL_TABLET | ORAL | Status: DC | PRN
  Administered 2022-10-21 – 2022-10-25 (×5): 650 mg via ORAL
  Filled 2022-10-20 (×5): qty 2

## 2022-10-20 MED ORDER — CYCLOBENZAPRINE HCL 5 MG PO TABS
5.0000 mg | ORAL_TABLET | Freq: Three times a day (TID) | ORAL | Status: DC
  Administered 2022-10-20 – 2022-10-27 (×20): 5 mg via ORAL
  Filled 2022-10-20 (×20): qty 1

## 2022-10-20 MED ORDER — ALUM & MAG HYDROXIDE-SIMETH 200-200-20 MG/5ML PO SUSP: 30.0000 mL | ORAL | Status: DC | PRN

## 2022-10-20 MED ORDER — MUPIROCIN 2 % EX OINT
1.0000 | TOPICAL_OINTMENT | Freq: Two times a day (BID) | CUTANEOUS | Status: AC
  Administered 2022-10-20: 1 via NASAL
  Filled 2022-10-20: qty 22

## 2022-10-20 MED ORDER — OXYCODONE HCL 5 MG PO TABS
5.0000 mg | ORAL_TABLET | ORAL | Status: DC | PRN
  Administered 2022-10-20 – 2022-10-22 (×2): 10 mg via ORAL
  Filled 2022-10-20: qty 2

## 2022-10-20 MED ORDER — TRAZODONE HCL 50 MG PO TABS
50.0000 mg | ORAL_TABLET | Freq: Every evening | ORAL | Status: DC | PRN
  Administered 2022-10-22: 50 mg via ORAL
  Filled 2022-10-20: qty 1

## 2022-10-20 MED ORDER — GUAIFENESIN-DM 100-10 MG/5ML PO SYRP: 10.0000 mL | ORAL_SOLUTION | Freq: Four times a day (QID) | ORAL | Status: DC | PRN

## 2022-10-20 MED ORDER — LISINOPRIL 20 MG PO TABS
20.0000 mg | ORAL_TABLET | Freq: Every day | ORAL | Status: DC
  Administered 2022-10-21 – 2022-10-22 (×2): 20 mg via ORAL
  Filled 2022-10-20 (×4): qty 1

## 2022-10-20 MED ORDER — ONDANSETRON HCL 4 MG PO TABS: 4.0000 mg | ORAL_TABLET | Freq: Four times a day (QID) | ORAL | Status: DC | PRN

## 2022-10-20 MED ORDER — PANTOPRAZOLE SODIUM 40 MG PO TBEC
40.0000 mg | DELAYED_RELEASE_TABLET | Freq: Two times a day (BID) | ORAL | Status: DC
  Administered 2022-10-20 – 2022-10-27 (×14): 40 mg via ORAL
  Filled 2022-10-20 (×14): qty 1

## 2022-10-20 MED ORDER — GABAPENTIN 300 MG PO CAPS
600.0000 mg | ORAL_CAPSULE | Freq: Three times a day (TID) | ORAL | Status: DC
  Administered 2022-10-20 – 2022-10-23 (×8): 600 mg via ORAL
  Filled 2022-10-20 (×8): qty 2

## 2022-10-20 MED ORDER — METHOCARBAMOL 500 MG PO TABS
500.0000 mg | ORAL_TABLET | Freq: Four times a day (QID) | ORAL | Status: DC | PRN
  Administered 2022-10-20 – 2022-10-26 (×13): 500 mg via ORAL
  Filled 2022-10-20 (×14): qty 1

## 2022-10-20 MED ORDER — DULOXETINE HCL 30 MG PO CPEP
120.0000 mg | ORAL_CAPSULE | Freq: Every day | ORAL | Status: DC
  Administered 2022-10-21 – 2022-10-27 (×7): 120 mg via ORAL
  Filled 2022-10-20 (×7): qty 4

## 2022-10-20 MED ORDER — CEFADROXIL 500 MG PO CAPS
500.0000 mg | ORAL_CAPSULE | Freq: Two times a day (BID) | ORAL | Status: DC
  Administered 2022-10-20 – 2022-10-27 (×14): 500 mg via ORAL
  Filled 2022-10-20 (×15): qty 1

## 2022-10-20 MED ORDER — ENOXAPARIN SODIUM 40 MG/0.4ML IJ SOSY
40.0000 mg | PREFILLED_SYRINGE | Freq: Every day | INTRAMUSCULAR | Status: DC
  Administered 2022-10-21 – 2022-10-27 (×7): 40 mg via SUBCUTANEOUS
  Filled 2022-10-20 (×7): qty 0.4

## 2022-10-20 MED ORDER — JUVEN PO PACK
1.0000 | PACK | Freq: Two times a day (BID) | ORAL | Status: DC
  Administered 2022-10-21 – 2022-10-27 (×13): 1 via ORAL
  Filled 2022-10-20 (×13): qty 1

## 2022-10-20 MED ORDER — DIPHENHYDRAMINE HCL 25 MG PO CAPS: 25.0000 mg | ORAL_CAPSULE | Freq: Four times a day (QID) | ORAL | Status: DC | PRN

## 2022-10-20 MED ORDER — ONDANSETRON HCL 4 MG/2ML IJ SOLN: 4.0000 mg | Freq: Four times a day (QID) | INTRAMUSCULAR | Status: DC | PRN

## 2022-10-20 MED ORDER — DOCUSATE SODIUM 100 MG PO CAPS
100.0000 mg | ORAL_CAPSULE | Freq: Every day | ORAL | Status: DC
  Administered 2022-10-21 – 2022-10-27 (×7): 100 mg via ORAL
  Filled 2022-10-20 (×7): qty 1

## 2022-10-20 MED ORDER — OXYCODONE HCL 5 MG PO TABS
10.0000 mg | ORAL_TABLET | ORAL | Status: DC | PRN
  Administered 2022-10-21 (×2): 15 mg via ORAL
  Administered 2022-10-21: 10 mg via ORAL
  Administered 2022-10-21 – 2022-10-23 (×9): 15 mg via ORAL
  Administered 2022-10-23: 10 mg via ORAL
  Administered 2022-10-23 – 2022-10-27 (×19): 15 mg via ORAL
  Filled 2022-10-20 (×13): qty 3
  Filled 2022-10-20 (×2): qty 2
  Filled 2022-10-20 (×3): qty 3
  Filled 2022-10-20: qty 2
  Filled 2022-10-20 (×15): qty 3

## 2022-10-20 MED ORDER — FLEET ENEMA 7-19 GM/118ML RE ENEM: 1.0000 | ENEMA | Freq: Once | RECTAL | Status: DC | PRN

## 2022-10-20 MED ORDER — BISACODYL 5 MG PO TBEC: 5.0000 mg | DELAYED_RELEASE_TABLET | Freq: Every day | ORAL | Status: DC | PRN

## 2022-10-20 MED ORDER — POLYETHYLENE GLYCOL 3350 17 G PO PACK: 17.0000 g | PACK | Freq: Every day | ORAL | Status: DC | PRN

## 2022-10-20 MED ORDER — CARIPRAZINE HCL 1.5 MG PO CAPS
1.5000 mg | ORAL_CAPSULE | Freq: Every day | ORAL | Status: DC
  Administered 2022-10-21 – 2022-10-27 (×7): 1.5 mg via ORAL
  Filled 2022-10-20 (×7): qty 1

## 2022-10-20 MED ORDER — ENOXAPARIN SODIUM 40 MG/0.4ML IJ SOSY: 40.0000 mg | PREFILLED_SYRINGE | INTRAMUSCULAR | Status: DC

## 2022-10-20 MED ORDER — VITAMIN C 500 MG PO TABS
1000.0000 mg | ORAL_TABLET | Freq: Every day | ORAL | Status: DC
  Administered 2022-10-21 – 2022-10-27 (×7): 1000 mg via ORAL
  Filled 2022-10-20 (×7): qty 2
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
## 2022-10-20 NOTE — TOC Progression Note (Signed)
Transition of Care (TOC) - Progression Note   Provided update to [facility] with [facility]. Await CIR determination  Patient Details  Name: Taylor [name] MRN: [id number] Date of Birth: April 27, 1991  Transition [facility]) CM/SW Contact  Allan Minotti, Adria Devon, RN Phone Number: 10/20/2022, 11:13 AM  Clinical Narrative:       Expected Discharge Plan: Home w Home Health Services Barriers to Discharge: No Barriers Identified  Expected Discharge Plan and Services   Discharge Planning Services: CM Consult Post Acute Care Choice: Home Health Living arrangements for the past 2 months: Apartment Expected Discharge Date: 10/20/22               DME Arranged: N/A         HH Arranged: RN, PT, OT HH Agency: [facility] Date HH Agency Contacted: 10/19/22 Time HH Agency Contacted: 1127 Representative spoke with at [facility] Agency: Tresa Endo   Social Determinants of Health (SDOH) Interventions SDOH Screenings   Food Insecurity: No Food Insecurity (10/16/2022)  Housing: Low Risk  (10/16/2022)  Transportation Needs: No Transportation Needs (10/16/2022)  Utilities: Not At Risk (10/16/2022)  Depression (PHQ2-9): High Risk (05/25/2022)  Financial Resource Strain: Low Risk  (03/25/2019)  Tobacco Use: Medium Risk (10/17/2022)    Readmission Risk Interventions     No data to display

## 2022-10-20 NOTE — Progress Notes (Signed)
Admitted to CIR today. Patient oriented to the unit and to assigned room. Admission/Assessment complete.     Tilden Dome, LPN

## 2022-10-20 NOTE — Progress Notes (Signed)
Patient ID: [name], female   DOB: June 24, 1990, 32 y.o.   MRN: [id number] Patient is status post revision right below-knee amputation.  There is no drainage in the wound VAC canister.  Patient states the wound VAC was off for a period of time.  Will hold on the stump shrinker until the wound VAC dressing is removed.  Patient being evaluated for inpatient rehab.
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
## 2022-10-20 NOTE — Progress Notes (Addendum)
   Taylor [name]  ZOX:[id number] DOB: [birth year] DOA: 10/15/2022 PCP: [name], FNP    Patient was medically stable for discharge 10/19/22 and preparations were made for discharge home.  Repeat physical therapy evaluation on the day of intended discharge however suggested the patient would be better served with discharge to inpatient rehab (initial evaluation had suggested home health).  The medical team agreed that CIR would be a better venue for discharge, and the patient was willing to pursue this.  Her discharge for 10/19/2022 was therefore postponed for planning purposes.  She was evaluated by the [facility] team 6/11 who agreed she was appropriate and therefore she was discharged to CIR instead.  No new medical issues developed in the interim between the time of her discharge summary 10/19/2022 and her actual discharge to [facility] 10/20/2022.  Vital signs remained stable and she was afebrile.  Lonia Blood, MD [facility] Office  [phone]  10/20/2022, 6:10 PM
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
## 2022-10-20 NOTE — Progress Notes (Signed)
Inpatient Rehab Admissions Coordinator:   I have a CIR bed for this Pt. And can admit her today.   Pt. In agreement to d/c to CIR for an intensive rehab program anticipated to last 5-7 days with the goal of reaching mod I goals with intermittent supervision from her father.  Megan Salon, MS, CCC-SLP Rehab Admissions Coordinator  [phone] (celll) [phone] (office)

## 2022-10-21 DIAGNOSIS — M7989 Other specified soft tissue disorders: Secondary | ICD-10-CM | POA: Diagnosis not present

## 2022-10-21 DIAGNOSIS — T8743 Infection of amputation stump, right lower extremity: Secondary | ICD-10-CM | POA: Diagnosis not present

## 2022-10-21 DIAGNOSIS — D62 Acute posthemorrhagic anemia: Secondary | ICD-10-CM | POA: Diagnosis not present

## 2022-10-21 DIAGNOSIS — F319 Bipolar disorder, unspecified: Secondary | ICD-10-CM | POA: Diagnosis not present

## 2022-10-21 LAB — COMPREHENSIVE METABOLIC PANEL
ALT: 36 U/L (ref 0–44)
AST: 27 U/L (ref 15–41)
Albumin: 3.4 g/dL — ABNORMAL LOW (ref 3.5–5.0)
Alkaline Phosphatase: 67 U/L (ref 38–126)
Anion gap: 10 (ref 5–15)
BUN: 16 mg/dL (ref 6–20)
CO2: 25 mmol/L (ref 22–32)
Calcium: 9.4 mg/dL (ref 8.9–10.3)
Chloride: 103 mmol/L (ref 98–111)
Creatinine, Ser: 0.7 mg/dL (ref 0.44–1.00)
GFR, Estimated: >60 mL/min (ref >60)
Glucose, Bld: 104 mg/dL — ABNORMAL HIGH (ref 70–99)
Potassium: 3.8 mmol/L (ref 3.5–5.1)
Sodium: 138 mmol/L (ref 135–145)
Total Bilirubin: 0.4 mg/dL (ref 0.3–1.2)
Total Protein: 6.9 g/dL (ref 6.5–8.1)

## 2022-10-21 LAB — CBC WITH DIFFERENTIAL/PLATELET
Abs Immature Granulocytes: 0.06 10*3/uL (ref 0.00–0.07)
Basophils Absolute: 0 10*3/uL (ref 0.0–0.1)
Basophils Relative: 1 %
Eosinophils Absolute: 0.5 10*3/uL (ref 0.0–0.5)
Eosinophils Relative: 7 %
HCT: 27.8 % — ABNORMAL LOW (ref 36.0–46.0)
Hemoglobin: 9.2 g/dL — ABNORMAL LOW (ref 12.0–15.0)
Immature Granulocytes: 1 %
Lymphocytes Relative: 18 %
Lymphs Abs: 1.5 10*3/uL (ref 0.7–4.0)
MCH: 30.5 pg (ref 26.0–34.0)
MCHC: 33.1 g/dL (ref 30.0–36.0)
MCV: 92.1 fL (ref 80.0–100.0)
Monocytes Absolute: 0.5 10*3/uL (ref 0.1–1.0)
Monocytes Relative: 6 %
Neutro Abs: 5.6 10*3/uL (ref 1.7–7.7)
Neutrophils Relative %: 67 %
Platelets: 279 10*3/uL (ref 150–400)
RBC: 3.02 MIL/uL — ABNORMAL LOW (ref 3.87–5.11)
RDW: 13.3 % (ref 11.5–15.5)
WBC: 8.2 10*3/uL (ref 4.0–10.5)
nRBC: 0 % (ref 0.0–0.2)

## 2022-10-21 LAB — AEROBIC/ANAEROBIC CULTURE W GRAM STAIN (SURGICAL/DEEP WOUND)

## 2022-10-21 NOTE — Patient Care Conference (Signed)
Inpatient RehabilitationTeam Conference and Plan of Care Update Date: 10/21/2022   Time: 12:05 PM    Patient Name: Taylor [name]      Medical Record Number: [id number]  Date of Birth: [birth year] Sex: Female         Room/Bed: 4M09C/4M09C-01 Payor Info: Payor: MEDICARE / Plan: MEDICARE PART A AND B / Product Type: *No Product type* /    Admit Date/Time:  10/20/2022  4:01 PM  Primary Diagnosis:  Right BKA infection [facility])  Hospital Problems: Principal Problem:   Right BKA infection [facility])    Expected Discharge Date: Expected Discharge Date: 10/27/22  Team Members Present: Physician leading conference: Dr. Fanny Dance Social Worker Present: Dossie Der, LCSW Nurse Present: Chana Bode, RN PT Present: Ambrose Finland, PT OT Present: Lou Cal, OT     Current Status/Progress Goal Weekly Team Focus  Bowel/Bladder   Pt continent B/B  LBM 10/20/22   Will maintain B/B pattern   Toilet qshift/prn    Swallow/Nutrition/ Hydration               ADL's   Setup-Supervision for UB ADLs & CGA for standing LB clothing management   Mod I   Safety awareness and IADLs    Mobility   bed mobility           Communication                Safety/Cognition/ Behavioral Observations               Pain   Verbalizes pain to right BKA. Pain managed with scheduled/prn pain medications   Will be free from pain   Assess pt for pain qshift/prn    Skin   Right BKA with wound vac in place. No drainage   Will be free from infection with no skin breakdown  Assess skin for breakdown and promote healing      Discharge Planning:  New evaluation was just here 09/2022 and discharged home with Dad and 71 yo daughter. Will confirm plans and see if [facility] still seeing   Team Discussion: Patient post revision of right BKA; remote history of multiple falls, ill fitting limb guard; limited by fibromyalgia and carpal tunnel. Duricef for 18 days post op and wrist braces for carpal  tunnel.  Patient on target to meet rehab goals: yes, currently able to complete a stand pivot transfer with CGA and ambulate up to 50' wearing limb guard. Mod I for ADLs.  *See Care Plan and progress notes for long and short-term goals.   Revisions to Treatment Plan:  Wound vac removal   Teaching Needs: Safety, limb guard protector wearing, stair management, medication, skin care, etc.   Current Barriers to Discharge: Decreased caregiver support and Home enviroment access/layout  Possible Resolutions to Barriers: Family education HH follow up services Has all DME from previous hospitalization     Medical Summary Current Status: R BKA revision, HTN, Bipolar  disorder, ABLA, Carpal tunnel, pain  Barriers to Discharge: Morbid Obesity;Self-care education;Medical stability  Barriers to Discharge Comments: R BKA revision, HTN, Bipolar disorder, ABLA, Carpal tunnel Possible Resolutions to [employer] Focus: wound vac removed, continue antibiotics, continue pain medications   Continued Need for Acute Rehabilitation Level of Care: The patient requires daily medical management by a physician with specialized training in physical medicine and rehabilitation for the following reasons: Direction of a multidisciplinary physical rehabilitation program to maximize functional independence : Yes Medical management of patient stability for increased activity during participation in  an intensive rehabilitation regime.: Yes Analysis of laboratory values and/or radiology reports with any subsequent need for medication adjustment and/or medical intervention. : Yes   I attest that I was present, lead the team conference, and concur with the assessment and plan of the team.   Chana Bode B 10/21/2022, 2:16 PM

## 2022-10-21 NOTE — Progress Notes (Signed)
Inpatient Rehabilitation Admission Medication Review by a Pharmacist  A complete drug regimen review was completed for this patient to identify any potential clinically significant medication issues.  High Risk Drug Classes Is patient taking? Indication by Medication  Antipsychotic Yes Cariprazine - bipolar   Anticoagulant Yes Lovenox - VTE ppx  Antibiotic Yes Cefadroxil - chronic osteo  Opioid Yes Oxycodone - prn pain  Antiplatelet No   Hypoglycemics/insulin No   Vasoactive Medication Yes Lisinopril - HTN  Chemotherapy No   Other Yes Bupropion, Duloxetine- Mood Gabapentin - pain Cyclobenzaprine - spasms Pantoprazole - Reflux Ondansetron - prn N/V Trazodone - prn sleep     Type of Medication Issue Identified Description of Issue Recommendation(s)  Drug Interaction(s) (clinically significant)     Duplicate Therapy     Allergy     No Medication Administration End Date     Incorrect Dose     Additional Drug Therapy Needed     Significant med changes from prior encounter (inform family/care partners about these prior to discharge).    Other       Clinically significant medication issues were identified that warrant physician communication and completion of prescribed/recommended actions by midnight of the next day:  No  Pharmacist comments: None  Time spent performing this drug regimen review (minutes):  20 minutes  Okey Regal, PharmD

## 2022-10-21 NOTE — Plan of Care (Signed)
  Problem: RH Bathing Goal: LTG Patient will bathe all body parts with assist levels (OT) Description: LTG: Patient will bathe all body parts with assist levels (OT) Flowsheets (Taken 10/21/2022 1323) LTG: Pt will perform bathing with assistance level/cueing: Independent with assistive device    Problem: RH Dressing Goal: LTG Patient will perform lower body dressing w/assist (OT) Description: LTG: Patient will perform lower body dressing with assist, with/without cues in positioning using equipment (OT) Flowsheets (Taken 10/21/2022 1323) LTG: Pt will perform lower body dressing with assistance level of: Independent with assistive device   Problem: RH Toileting Goal: LTG Patient will perform toileting task (3/3 steps) with assistance level (OT) Description: LTG: Patient will perform toileting task (3/3 steps) with assistance level (OT)  Flowsheets (Taken 10/21/2022 1323) LTG: Pt will perform toileting task (3/3 steps) with assistance level: Independent with assistive device   Problem: RH Simple Meal Prep Goal: LTG Patient will perform simple meal prep w/assist (OT) Description: LTG: Patient will perform simple meal prep with assistance, with/without cues (OT). Flowsheets (Taken 10/21/2022 1323) LTG: Pt will perform simple meal prep with assistance level of: Independent with assistive device   Problem: RH Light Housekeeping Goal: LTG Patient will perform light housekeeping w/assist (OT) Description: LTG: Patient will perform light housekeeping with assistance, with/without cues (OT). Flowsheets (Taken 10/21/2022 1323) LTG: Pt will perform light housekeeping with assistance level of: Independent with assistive device   Problem: RH Toilet Transfers Goal: LTG Patient will perform toilet transfers w/assist (OT) Description: LTG: Patient will perform toilet transfers with assist, with/without cues using equipment (OT) Flowsheets (Taken 10/21/2022 1323) LTG: Pt will perform toilet transfers with  assistance level of: Independent with assistive device   Problem: RH Tub/Shower Transfers Goal: LTG Patient will perform tub/shower transfers w/assist (OT) Description: LTG: Patient will perform tub/shower transfers with assist, with/without cues using equipment (OT) Flowsheets (Taken 10/21/2022 1323) LTG: Pt will perform tub/shower stall transfers with assistance level of: Independent with assistive device

## 2022-10-21 NOTE — Plan of Care (Signed)
  Problem: RH Balance Goal: LTG Patient will maintain dynamic standing balance (PT) Description: LTG:  Patient will maintain dynamic standing balance with assistance during mobility activities (PT) Flowsheets (Taken 10/21/2022 1602) LTG: Pt will maintain dynamic standing balance during mobility activities with:: Independent with assistive device    Problem: Sit to Stand Goal: LTG:  Patient will perform sit to stand with assistance level (PT) Description: LTG:  Patient will perform sit to stand with assistance level (PT) Flowsheets (Taken 10/21/2022 1602) LTG: PT will perform sit to stand in preparation for functional mobility with assistance level: Independent with assistive device   Problem: RH Bed to Chair Transfers Goal: LTG Patient will perform bed/chair transfers w/assist (PT) Description: LTG: Patient will perform bed to chair transfers with assistance (PT). Flowsheets (Taken 10/21/2022 1602) LTG: Pt will perform Bed to Chair Transfers with assistance level: Independent with assistive device    Problem: RH Car Transfers Goal: LTG Patient will perform car transfers with assist (PT) Description: LTG: Patient will perform car transfers with assistance (PT). Flowsheets (Taken 10/21/2022 1602) LTG: Pt will perform car transfers with assist:: Independent with assistive device    Problem: RH Stairs Goal: LTG Patient will ambulate up and down stairs w/assist (PT) Description: LTG: Patient will ambulate up and down # of stairs with assistance (PT) 10/21/2022 1603 by Ambrose Finland, PT Flowsheets (Taken 10/21/2022 1603) LTG: Pt will ambulate up/down stairs assist needed:: Supervision/Verbal cueing 10/21/2022 1602 by Ambrose Finland, PT Flowsheets (Taken 10/21/2022 1602) LTG: Pt will ambulate up/down stairs assist needed:: Independent with assistive device LTG: Pt will  ambulate up and down number of stairs: 12

## 2022-10-21 NOTE — Discharge Summary (Signed)
Physician Discharge Summary  Patient ID: Taylor [name] MRN: [id number] DOB/AGE: [birth year] 32 y.o.  Admit date: 10/20/2022 Discharge date: 10/27/2022  Discharge Diagnoses:  Principal Problem:   Right BKA infection [facility]) Active problems: Bipolar disorder Surgical incision infection Hypertension Morbid obesity Blood loss anemia Bilateral wrist pain Insomnia Discharged Condition: {condition:18240}  Significant Diagnostic Studies:  Labs:  Basic Metabolic Panel: Recent Labs  Lab 10/15/22 1526 10/16/22 0207 10/19/22 0102 10/21/22 0620  NA 135 138 136 138  K 4.4 4.1 3.6 3.8  CL 102 98 102 103  CO2 25 25 26 25   GLUCOSE 108* 114* 98 104*  BUN 15 12 12 16   CREATININE 1.10* 0.82 0.64 0.70  CALCIUM 9.0 9.3 8.8* 9.4    CBC: Recent Labs  Lab 10/15/22 1526 10/16/22 0207 10/17/22 0046 10/19/22 0102 10/21/22 0620  WBC 7.4   < > 8.2 8.7 8.2  NEUTROABS 4.2  --   --   --  5.6  HGB 10.6*   < > 8.9* 8.4* 9.2*  HCT 34.0*   < > 27.8* 25.8* 27.8*  MCV 96.0   < > 94.2 95.2 92.1  PLT 244   < > 212 206 279   < > = values in this interval not displayed.    CBG: No results for input(s): "GLUCAP" in the last 168 hours.  Brief HPI:   Taylor [name] is a 32 y.o. female with a past medical history significant for right ankle fracture requiring multiple surgeries and eventual fusion.  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  She was treated with IV antibiotics and placed in a fracture boot.  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 Lajoyce Corners.  She was admitted to inpatient rehab on 09/18/2022 and discharged on 09/28/2022.  On 10/15/2022, the patient alerted Dr. Audrie Lia office of multiple falls and continued pain at her surgical site.  She  was advised to present to the emergency department for further evaluation. Labs show WBC 7.4, hemoglobin 10.6, platelets 244,000, sodium 135, potassium 4.4, bicarb 25, BUN 15, creatinine 1.10, serum glucose 108, LFTs within normal limits, i-STAT beta-hCG <5.0. She was admitted and placed on cefepime and vancomycin.  Dr. Lajoyce Corners was consulted and on 6/07 she was taken to the [facility] room and underwent revision of right below-knee amputation with application of wound VAC.  Antibiotics were de-escalated to oral Duricef 500 mg twice daily for duration of therapy through 6/28.  Acute blood loss anemia noted.  She remains hemodynamically stable, afebrile and is tolerating her diet.  She reports numbness in her hands, suspected to be related to carpal tunnel syndrome.  She recently reports having weakness in her hands however she says this has improved.  She reports pain throughout the joints of her entire body, this is generally chronic.    Hospital Course: Taylor [name] was admitted to rehab 10/20/2022 for inpatient therapies to consist of PT, ST and OT at least three hours five days a week. Past admission physiatrist, therapy team and rehab RN have worked together to provide customized collaborative inpatient rehab. Labs stable and H and H improved. Suspected carpal tunnel syndrome bilateral upper extremities>>Advise continuing use of wrist braces at night, may need further evaluation on outpatient basis. Gabapentin 600 mg TID started 6/11. Wound VAC  removed 6/12. Mood stable. 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 Continued to require oxycodone 15 mg every 4-6 hours.   Blood pressures were monitored on TID basis and lisinopril 20 mg daily continued.   Rehab course: During patient's stay in rehab weekly team conferences were held to monitor patient's progress, set goals and discuss barriers to discharge. At admission, patient required CGA  with  mobility and supervision-CGA with basic self-care skills   She  has had improvement in activity tolerance, balance, postural control as well as ability to compensate for deficits. She has had improvement in functional use RUE/LUE  and RLE/LLE as well as improvement in awareness       Disposition:  There are no questions and answers to display.         Diet: heart healthy  Special Instructions: No driving, alcohol consumption or tobacco use.  Suspected carpal tunnel syndrome bilateral upper extremities -Advised continuing use of wrist braces at night, may need further evaluation on outpatient basis Allergies as of 10/21/2022       Reactions   Other Itching   Animal Dander     Med Rec must be completed prior to using this [facility]***        Signed: Milinda Antis 10/21/2022, 1:34 PM

## 2022-10-21 NOTE — Progress Notes (Signed)
Inpatient Rehabilitation Center Individual Statement of Services  Patient Name:  Taylor [name]  Date:  10/21/2022  Welcome to the Inpatient Rehabilitation Center.  Our goal is to provide you with an individualized program based on your diagnosis and situation, designed to meet your specific needs.  With this comprehensive rehabilitation program, you will be expected to participate in at least 3 hours of rehabilitation therapies Monday-Friday, with modified therapy programming on the weekends.  Your rehabilitation program will include the following services:  Physical Therapy (PT), Occupational Therapy (OT), 24 hour per day rehabilitation nursing, Therapeutic Recreaction (TR), Care Coordinator, Rehabilitation Medicine, Nutrition Services, and Pharmacy Services  Weekly team conferences will be held on Wednesday to discuss your progress.  Your Inpatient Rehabilitation Care Coordinator will talk with you frequently to get your input and to update you on team discussions.  Team conferences with you and your family in attendance may also be held.  Expected length of stay: 5-7 days  Overall anticipated outcome:  mod/I level  Depending on your progress and recovery, your program may change. Your Inpatient Rehabilitation Care Coordinator will coordinate services and will keep you informed of any changes. Your Inpatient Rehabilitation Care Coordinator's name and contact numbers are listed  below.  The following services may also be recommended but are not provided by the Inpatient Rehabilitation Center:   Home Health Rehabiltiation Services Outpatient Rehabilitation Services    Arrangements will be made to provide these services after discharge if needed.  Arrangements include referral to agencies that provide these services.  Your insurance has been verified to be:  Medicare and medicaid Your primary doctor is:  [name]  Pertinent information will be shared with your doctor and your  insurance company.  Inpatient Rehabilitation Care Coordinator:  Dossie Der, [place] [phone] or Luna Glasgow  Information discussed with and copy given to patient by: Lucy Chris, 10/21/2022, 2:40 PM
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
## 2022-10-21 NOTE — Progress Notes (Signed)
Pt called out for nurse since wound vac was leaking. Upon assessment wound vac dressing is coming off and pt states that she feels a lot of moisture beneath dressing. Notified Dr. Lajoyce Corners, per his instruction remove wound vac and apply dry dressing and apply stump shrinker. Wound vac removed, sutures in place, edges approximated. Small amount of bloody drainage noted. Incision cleansed, dry dressing and shrinker applied. Pt tolerated well.

## 2022-10-21 NOTE — Progress Notes (Signed)
Recreational Therapy Session Note  Patient Details  Name: Taylor [name] MRN: [id number] Date of Birth: Sep 04, 1990 Today's Date: 10/21/2022  Pain: no c/o Pt participated in animal assisted activity seated level with supervision.  Pt easily engaged with pet partner team and appreciative of this visit.  Laverne Klugh 10/21/2022, 11:54 AM

## 2022-10-21 NOTE — Progress Notes (Signed)
Inpatient Rehabilitation  Patient information reviewed and entered into eRehab system by Izaias Krupka M. Jhonny Calixto, M.A., CCC/SLP, PPS Coordinator.  Information including medical coding, functional ability and quality indicators will be reviewed and updated through discharge.    

## 2022-10-21 NOTE — Progress Notes (Signed)
PROGRESS NOTE   Subjective/Complaints: Wound vac was not functioning correctly this AM, nursing contacted Dr. Lajoyce Corners and vac removed. She reports decreased appetite this AM. No additional concerns or complaints.   LBM today  Review of Systems  Constitutional:  Negative for chills and fever.  HENT:  Negative for congestion.   Eyes:  Negative for double vision.  Respiratory:  Negative for shortness of breath.   Cardiovascular:  Negative for chest pain.  Gastrointestinal:  Negative for abdominal pain, nausea and vomiting.  Musculoskeletal:  Positive for back pain and joint pain.  Skin:  Negative for rash.  Neurological:  Positive for tingling and sensory change. Negative for focal weakness.    Objective:   No results found. Recent Labs    10/19/22 0102 10/21/22 0620  WBC 8.7 8.2  HGB 8.4* 9.2*  HCT 25.8* 27.8*  PLT 206 279   Recent Labs    10/19/22 0102 10/21/22 0620  NA 136 138  K 3.6 3.8  CL 102 103  CO2 26 25  GLUCOSE 98 104*  BUN 12 16  CREATININE 0.64 0.70  CALCIUM 8.8* 9.4    Intake/Output Summary (Last 24 hours) at 10/21/2022 0848 Last data filed at 10/21/2022 0835 Gross per 24 hour  Intake 720 ml  Output --  Net 720 ml        Physical Exam: Vital Signs Blood pressure 121/67, pulse 87, temperature 98.2 F (36.8 C), temperature source Oral, resp. rate 16, height 5\' 4"  (1.626 m), weight 116 kg, SpO2 98 %.  General: Alert and oriented x 3, No apparent distress HEENT: Head is normocephalic, atraumatic, PERRLA, EOMI, sclera anicteric, oral mucosa pink and moist, dentition intact, ext ear canals clear,  Neck: Supple without JVD or lymphadenopathy Heart: Reg rate and rhythm. No murmurs rubs or gallops Chest: CTA bilaterally without wheezes, rales, or rhonchi; no distress Abdomen: Soft, non-tender, non-distended, bowel sounds positive. Extremities: R BKA wound vac has been removed LLE swelling   Psych: Pt's affect is appropriate. Pt is cooperative Skin: warm and dry Neuro: Alert and oriented x 3, follows commands, cranial nerves II through XII grossly intact, normal speech and language Strength 5/5 in b/l UE and LLE Sensation intact light touch in all 4 extremities Tinels positive b/l wrist  Musculoskeletal: no abnormal tone noted, no joint swelling noted   Assessment/Plan: 1. Functional deficits which require 3+ hours per day of interdisciplinary therapy in a comprehensive inpatient rehab setting. Physiatrist is providing close team supervision and 24 hour management of active medical problems listed below. Physiatrist and rehab team continue to assess barriers to discharge/monitor patient progress toward functional and medical goals  Care Tool:  Bathing              Bathing assist       Upper Body Dressing/Undressing Upper body dressing        Upper body assist      Lower Body Dressing/Undressing Lower body dressing            Lower body assist       Toileting Toileting    Toileting assist       Transfers Chair/bed transfer  Transfers assist  Locomotion Ambulation   Ambulation assist              Walk 10 feet activity   Assist           Walk 50 feet activity   Assist           Walk 150 feet activity   Assist           Walk 10 feet on uneven surface  activity   Assist           Wheelchair     Assist               Wheelchair 50 feet with 2 turns activity    Assist            Wheelchair 150 feet activity     Assist          Blood pressure 121/67, pulse 87, temperature 98.2 F (36.8 C), temperature source Oral, resp. rate 16, height 5\' 4"  (1.626 m), weight 116 kg, SpO2 98 %.  Medical Problem List and Plan: 1. 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 Eual Fines due to cellulitis and  wound dehiscence due to multiple falls             -patient may not shower             -ELOS/Goals: 5 to 7 days, mod I goals with PT and OT             -Continue CIR  -Team conference today please see physician documentation under team conference tab, met with team  to discuss problems,progress, and goals. Formulized individual treatment plan based on medical history, underlying problem and comorbidities.   -Reports prior falls related to poor quality walker obtained from [facility], she is getting this replaced    2.  Antithrombotics: -DVT/anticoagulation:  Pharmaceutical: Lovenox             -antiplatelet therapy: none  -Check vas U/s for  LLE swelling    3. Pain Management: Tylenol, oxycodone, Robaxin as needed             -continue Neurontin 600 mg TID             -continue Flexeril 5 mg TID   4. Mood/Behavior/Sleep: LCSW to evaluate and provide emotional support             -antipsychotic agents: continue Vraylar 1.5 mg daily             -continue Wellbutrin, Cymbalta   5. Neuropsych/cognition: This patient is capable of making decisions on her own behalf.   6. Skin/Wound Care: Routine skin care checks   7. Fluids/Electrolytes/Nutrition: Routine Is and Os and follow-up chemistries             -continue vitamin c   8: Hypertension: Controlled, monitor TID and prn             -continue lisinopril 20 mg daily  -6/12 BP well controlled      10/21/2022    5:11 AM 10/20/2022    8:07 PM 10/20/2022    5:13 PM  Vitals with BMI  Height   5\' 4"   Systolic 121 125   Diastolic 67 58   Pulse 87 99       9: Bipolar disorder/PTSD/anxiety/depression: see #4  -Mood stable, continue current regimen   10: Morbid obesity: BMI 45.49             -  Dietary counseling   11: Right BKA site infection s/p revision Dr. Lajoyce Corners 6/07             -continue wound Vac             -continue Duricef 500 mg BID for total of 36 doses  -6/12 Wound vac removed today   12: ABLA: Last HBG 8.4 on 10/20/22.   follow-up CBC  -6/12 HGB up to 9.2, continue to monitor    13.  Suspected carpal tunnel syndrome bilateral upper extremities             -Advise continuing use of wrist braces at night, may need further evaluation on outpatient basis      LOS: 1 days A FACE TO FACE EVALUATION WAS PERFORMED  Fanny Dance 10/21/2022, 8:48 AM

## 2022-10-21 NOTE — Progress Notes (Signed)
Orthopedic Tech Progress Note Patient Details:  Taylor [name] Feb 02, 1991 [id number]  Called floor to see if patient received her STUMP SHRINKERS that I had called in 2 days ago while patient was still up on 6N. No answer   Patient ID: Taylor [name], female   DOB: 02-Apr-1991, 32 y.o.   MRN: [id number]  [name] 10/21/2022, 12:54 PM
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
## 2022-10-21 NOTE — Progress Notes (Signed)
Orthopedic Tech Progress Note Patient Details:  Taylor [name] [birth year] [id number]  Called in order to [facility]   Patient ID: [name], female   DOB: 02-11-1991, 32 y.o.   MRN: [id number]  [name] 10/21/2022, 1:33 PM

## 2022-10-21 NOTE — Plan of Care (Signed)
  Problem: RH Balance Goal: LTG Patient will maintain dynamic standing balance (PT) Description: LTG:  Patient will maintain dynamic standing balance with assistance during mobility activities (PT) Flowsheets (Taken 10/21/2022 1602) LTG: Pt will maintain dynamic standing balance during mobility activities with:: Independent with assistive device    Problem: Sit to Stand Goal: LTG:  Patient will perform sit to stand with assistance level (PT) Description: LTG:  Patient will perform sit to stand with assistance level (PT) Flowsheets (Taken 10/21/2022 1602) LTG: PT will perform sit to stand in preparation for functional mobility with assistance level: Independent with assistive device   Problem: RH Bed to Chair Transfers Goal: LTG Patient will perform bed/chair transfers w/assist (PT) Description: LTG: Patient will perform bed to chair transfers with assistance (PT). Flowsheets (Taken 10/21/2022 1602) LTG: Pt will perform Bed to Chair Transfers with assistance level: Independent with assistive device    Problem: RH Car Transfers Goal: LTG Patient will perform car transfers with assist (PT) Description: LTG: Patient will perform car transfers with assistance (PT). Flowsheets (Taken 10/21/2022 1602) LTG: Pt will perform car transfers with assist:: Independent with assistive device    Problem: RH Stairs Goal: LTG Patient will ambulate up and down stairs w/assist (PT) Description: LTG: Patient will ambulate up and down # of stairs with assistance (PT) 10/21/2022 1603 by Ambrose Finland, PT Flowsheets (Taken 10/21/2022 1603) LTG: Pt will ambulate up/down stairs assist needed:: Supervision/Verbal cueing 10/21/2022 1602 by Ambrose Finland, PT Flowsheets (Taken 10/21/2022 1602) LTG: Pt will ambulate up/down stairs assist needed:: Independent with assistive device LTG: Pt will  ambulate up and down number of stairs: 12   Problem: RH Ambulation Goal: LTG Patient will ambulate in controlled  environment (PT) Description: LTG: Patient will ambulate in a controlled environment, # of feet with assistance (PT). Flowsheets (Taken 10/21/2022 1607) LTG: Pt will ambulate in controlled environ  assist needed:: Supervision/Verbal cueing LTG: Ambulation distance in controlled environment: 50 Goal: LTG Patient will ambulate in home environment (PT) Description: LTG: Patient will ambulate in home environment, # of feet with assistance (PT). Flowsheets (Taken 10/21/2022 1607) LTG: Pt will ambulate in home environ  assist needed:: Independent with assistive device LTG: Ambulation distance in home environment: 25

## 2022-10-21 NOTE — Discharge Instructions (Addendum)
Inpatient Rehab Discharge Instructions  [name] Discharge date and time: 10/27/2022  Activities/Precautions/ Functional Status: Activity: no lifting, driving, or strenuous exercise until cleared by MD Diet: cardiac diet Wound Care: keep wound clean and dry Functional status:  ___ No restrictions     ___ Walk up steps independently ___ 24/7 supervision/assistance   ___ Walk up steps with assistance __x_ Intermittent supervision/assistance  ___ Bathe/dress independently ___ Walk with walker     ___ Bathe/dress with assistance ___ Walk Independently    ___ Shower independently ___ Walk with assistance    __x_ Shower with assistance __x_ No alcohol     ___ Return to work/school ________  Special Instructions: No driving, alcohol consumption or tobacco use.   COMMUNITY REFERRALS UPON DISCHARGE:    Home Health:   PT   OT   RN                  Agency:[facility] Phone: [phone]    Medical Equipment/Items Ordered:HAS ALL EQUIPMENT FROM PAST ADMISSION                                                 Agency/Supplier:NA   My questions have been answered and I understand these instructions. I will adhere to these goals and the provided educational materials after my discharge from the hospital.  Patient/Caregiver Signature _______________________________ Date __________  Clinician Signature _______________________________________ Date __________  Please bring this form and your medication list with you to all your follow-up doctor's appointments.
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
## 2022-10-22 ENCOUNTER — Ambulatory Visit (HOSPITAL_COMMUNITY): Payer: Medicare Other | Attending: Physical Medicine & Rehabilitation

## 2022-10-22 ENCOUNTER — Encounter: Payer: Medicare Other | Admitting: Orthopedic Surgery

## 2022-10-22 DIAGNOSIS — R600 Localized edema: Secondary | ICD-10-CM | POA: Diagnosis not present

## 2022-10-22 DIAGNOSIS — I10 Essential (primary) hypertension: Secondary | ICD-10-CM | POA: Diagnosis not present

## 2022-10-22 DIAGNOSIS — R609 Edema, unspecified: Secondary | ICD-10-CM | POA: Diagnosis not present

## 2022-10-22 DIAGNOSIS — M7989 Other specified soft tissue disorders: Secondary | ICD-10-CM | POA: Diagnosis present

## 2022-10-22 DIAGNOSIS — T8743 Infection of amputation stump, right lower extremity: Secondary | ICD-10-CM | POA: Diagnosis not present

## 2022-10-22 NOTE — Progress Notes (Signed)
LLE venous duplex has been completed.   Results can be found under chart review under CV PROC. 10/22/2022 5:30 PM Zayyan Mullen RVT, RDMS

## 2022-10-22 NOTE — Progress Notes (Signed)
Physical Therapy Session Note  Patient Details  Name: Taylor [name] MRN: [id number] Date of Birth: 09-29-1990  Today's Date: 10/22/2022 PT Individual Time: 1300-1410 PT Individual Time Calculation (min): 70 min   Short Term Goals: Week 1:  PT Short Term Goal 1 (Week 1): STG = LTG due to ELOS  Skilled Therapeutic Interventions/Progress Updates:    Pt seated EOB and reports 7/10 pain that rose with mobility during session. Pt was extremely limited by pain this session. Nsg provided medication, rest and positioning as needed. Stand pivot transfer throughout session with CG-supervision with RW. Sit<>supine several times with supervision on mat table.  Pt performed the following exercises to promote LE strength and endurance:  Internal external rotation,  SLR with cues for internal rotation Knee flexion/extension Lower trunk rotations with BLE on physioball for core strength and back pain management  Single leg bridge 6 x 4   Pt returned to room and performed ambulatory transfer to bathroom. Alerted nsg staff that pt will use bathroom call bell to return request help returning to bed.   Therapy Documentation Precautions:  Precautions Precautions: Fall Precaution Comments: R BKA Required Braces or Orthoses: Other Brace Other Brace: R limb guard Restrictions Weight Bearing Restrictions: Yes RLE Weight Bearing: Non weight bearing General:       Therapy/Group: Individual Therapy  Juluis Rainier 10/22/2022, 1:41 PM

## 2022-10-22 NOTE — Progress Notes (Signed)
Physical Therapy Session Note  Patient Details  Name: Taylor [name] MRN: [id number] Date of Birth: 1990/05/14  Today's Date: 10/22/2022 PT Individual Time: 1015-1112 PT Individual Time Calculation (min): 57 min   Short Term Goals: Week 1:  PT Short Term Goal 1 (Week 1): STG = LTG due to ELOS  Skilled Therapeutic Interventions/Progress Updates:    pt received seated EOB and agreeable to therapy. Pt expressed her thoughts and feelings about hospital stay and her amputation throughout session, provided psychosocial support and active listening as appropriate. Pt reports mild unrated pain in her residual limb and chronic discomfort from carpal tunnel. Sit to stand and Stand pivot transfer with RW and CGA. Pt able to manage w/c parts with supervision. Session focused on w/c mobility for endurance and functional mobility. Pt self propelled to [facility] with x 2 short breaks for benefit of outdoor environment on mood and affect. Cues for navigating elevators and efficiency. On return to unit, pt requested to use bathroom. ambulatory transfer with RW and CGA. Sit to stand with supervision from both w/c and commode with use of grab bars. Supervision for 3/3 toileting tasks. Pt returned to w/c in room in same manner. Pt remained in w/c, propelling around room with foot while cleaning off her tray and awaiting pending OT session.   Therapy Documentation Precautions:  Precautions Precautions: Fall Precaution Comments: R BKA Required Braces or Orthoses: Other Brace Other Brace: R limb guard Restrictions Weight Bearing Restrictions: Yes RLE Weight Bearing: Non weight bearing General:       Therapy/Group: Individual Therapy  Juluis Rainier 10/22/2022, 12:34 PM

## 2022-10-22 NOTE — Progress Notes (Signed)
Occupational Therapy Session Note  Patient Details  Name: Taylor [name] MRN: [id number] Date of Birth: 11-28-1990  Today's Date: 10/22/2022 OT Individual Time: 1115-1200 OT Individual Time Calculation (min): 45 min    Short Term Goals: Week 1:  OT Short Term Goal 1 (Week 1): STGs=LTGs due to patient's length of stay.  Skilled Therapeutic Interventions/Progress Updates:   Pt seated in w/c upon OT arrival. R residual limb dangling with no support. "I was just wheeling around the room getting things together". OT educ on elevating and resting  R residual limb on amputee pad. OT educated in mirror therapy via introduction with video for education. Pt transferred via SPT to bed with 15 min trial mirror therapy with pt open to education. Issued and trained in foam grasp trainer for B hands d/t report of stiffness and numbness from CTS. Teach back indep. Pt left bed level with all safety needs in reach and R LE elevated on pillow.   Pain: 6/10 R residual limb, use of mirror therapy with + results with distraction as well making way for R LE relief with un-rated but reported decreased pain following 10 min mirror therapy and R LE   Therapy Documentation Precautions:  Precautions Precautions: Fall Precaution Comments: R BKA Required Braces or Orthoses: Other Brace Other Brace: R limb guard Restrictions Weight Bearing Restrictions: Yes RLE Weight Bearing: Non weight bearing   Therapy/Group: Individual Therapy  Vicenta Dunning 10/22/2022, 7:39 AM

## 2022-10-22 NOTE — Progress Notes (Signed)
Physical Therapy Session Note  Patient Details  Name: Taylor [name] MRN: [id number] Date of Birth: Jun 20, 1990  Today's Date: 10/22/2022 PT Individual Time: 2595-6387 PT Individual Time Calculation (min): 28 min   Short Term Goals: Week 1:  PT Short Term Goal 1 (Week 1): STG = LTG due to ELOS  Skilled Therapeutic Interventions/Progress Updates:      Pt seated EOB upon arrival. Pt agreeable to therapy. Pt premedicated and reports minimal pain.   Pt anxious about R LE shrinker. Pt has grey shrinker on delivered by hanger and donned with OT. Pt reports it is also rolling down and irritating pt. Therapist trialed multiple methods to improve pt comfort and reduce it rolling down. Pt ultimately opted to return to black shrinker. Discussed AKA shrinker with strap around waist, pt opted not to trial it 2/2 anxiety with shrinker being around waist and near groin. Pt donned and doffed shrinker with min A to keep dressing in position.   Therapist then anxious about risk of infection with donning/doffing shrinker and requesting to change dressing. Therapist verified current dressing with nurse, and changed pt dressing. Donned shrinker on top of dressing as pt and nurse reports pt agreed to dry nonadherent dressing directly on incision 2/2 excess drainage. Pt donned shrinker on top of dressing with gloves on.   Pt seated EOB at end of session with bed alarm on and needs within reach.   Therapy Documentation Precautions:  Precautions Precautions: Fall Precaution Comments: R BKA Required Braces or Orthoses: Other Brace Other Brace: R limb guard Restrictions Weight Bearing Restrictions: Yes RLE Weight Bearing: Non weight bearing  Therapy/Group: Individual Therapy  [facility] Ambrose [place], , DPT  10/22/2022, 7:56 AM

## 2022-10-22 NOTE — Progress Notes (Signed)
PROGRESS NOTE   Subjective/Complaints: Reports pain from sleeping in poor position last night. Reports some mild drainage from incision. No additional concerns.   LBM today  Review of Systems  Constitutional:  Negative for chills and fever.  HENT:  Negative for congestion.   Eyes:  Negative for double vision.  Respiratory:  Negative for shortness of breath.   Cardiovascular:  Negative for chest pain.  Gastrointestinal:  Negative for abdominal pain, nausea and vomiting.  Genitourinary: Negative.   Musculoskeletal:  Positive for back pain and joint pain.  Skin:  Negative for rash.  Neurological:  Positive for tingling and sensory change. Negative for focal weakness.    Objective:   No results found. Recent Labs    10/21/22 0620  WBC 8.2  HGB 9.2*  HCT 27.8*  PLT 279    Recent Labs    10/21/22 0620  NA 138  K 3.8  CL 103  CO2 25  GLUCOSE 104*  BUN 16  CREATININE 0.70  CALCIUM 9.4     Intake/Output Summary (Last 24 hours) at 10/22/2022 0854 Last data filed at 10/22/2022 0753 Gross per 24 hour  Intake 960 ml  Output --  Net 960 ml         Physical Exam: Vital Signs Blood pressure (!) 111/99, pulse 84, temperature 98.1 F (36.7 C), temperature source Oral, resp. rate 17, height 5\' 4"  (1.626 m), weight 116 kg, SpO2 99 %.  General: Alert and oriented x 3, No apparent distress HEENT: Head is normocephalic, atraumatic, PERRLA, EOMI, sclera anicteric, oral mucosa pink and moist, dentition intact, ext ear canals clear,  Neck: Supple without JVD or lymphadenopathy Heart: Reg rate and rhythm. No murmurs rubs or gallops Chest: CTA bilaterally without wheezes, rales, or rhonchi; no distress Abdomen: Soft, non-tender, non-distended, bowel sounds positive. Extremities: R BKA wound vac has been removed LLE swelling  Psych: Pt's affect is appropriate. Pt is cooperative Skin: warm and dry Please see  image Neuro: Alert and oriented x 3, follows commands, cranial nerves II through XII grossly intact, normal speech and language Strength 5/5 in b/l UE and LLE Sensation intact light touch in all 4 extremities Tinels positive b/l wrist  Musculoskeletal: no abnormal tone noted, no joint swelling noted      Assessment/Plan: 1. Functional deficits which require 3+ hours per day of interdisciplinary therapy in a comprehensive inpatient rehab setting. Physiatrist is providing close team supervision and 24 hour management of active medical problems listed below. Physiatrist and rehab team continue to assess barriers to discharge/monitor patient progress toward functional and medical goals  Care Tool:  Bathing    Body parts bathed by patient: Right arm, Left arm, Chest, Abdomen, Front perineal area, Buttocks, Right upper leg, Left upper leg, Left lower leg, Face     Body parts n/a: Right lower leg   Bathing assist Assist Level: Supervision/Verbal cueing     Upper Body Dressing/Undressing Upper body dressing   What is the patient wearing?: Pull over shirt    Upper body assist Assist Level: Independent with assistive device    Lower Body Dressing/Undressing Lower body dressing      What is the patient wearing?:  Pants     Lower body assist Assist for lower body dressing: Contact Guard/Touching assist     Toileting Toileting    Toileting assist Assist for toileting: Contact Guard/Touching assist     Transfers Chair/bed transfer  Transfers assist     Chair/bed transfer assist level: Contact Guard/Touching assist     Locomotion Ambulation   Ambulation assist      Assist level: Contact Guard/Touching assist Assistive device: Walker-rolling Max distance: 50   Walk 10 feet activity   Assist     Assist level: Contact Guard/Touching assist Assistive device: Walker-rolling   Walk 50 feet activity   Assist    Assist level: Contact Guard/Touching  assist      Walk 150 feet activity   Assist Walk 150 feet activity did not occur: Safety/medical concerns         Walk 10 feet on uneven surface  activity   Assist Walk 10 feet on uneven surfaces activity did not occur: Safety/medical concerns         Wheelchair     Assist Is the patient using a wheelchair?: Yes Type of Wheelchair: Manual    Wheelchair assist level: Independent Max wheelchair distance: 150+    Wheelchair 50 feet with 2 turns activity    Assist        Assist Level: Independent   Wheelchair 150 feet activity     Assist      Assist Level: Independent   Blood pressure (!) 111/99, pulse 84, temperature 98.1 F (36.7 C), temperature source Oral, resp. rate 17, height 5\' 4"  (1.626 m), weight 116 kg, SpO2 99 %.  Medical Problem List and Plan: 1. 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 Eual Fines due to cellulitis and wound dehiscence due to multiple falls             -patient may not shower             -ELOS/Goals: 5 to 7 days, mod I goals with PT and OT             -Continue CIR  -Reports prior falls related to poor quality walker obtained from [facility], she has had this replaced  -Estimated DC 6/18  -Stinker ordered   2.  Antithrombotics: -DVT/anticoagulation:  Pharmaceutical: Lovenox             -antiplatelet therapy: none  -Check vas U/s for  LLE swelling    3. Pain Management: Tylenol, oxycodone, Robaxin as needed             -continue Neurontin 600 mg TID             -continue Flexeril 5 mg TID   4. Mood/Behavior/Sleep: LCSW to evaluate and provide emotional support             -antipsychotic agents: continue Vraylar 1.5 mg daily             -continue Wellbutrin, Cymbalta   5. Neuropsych/cognition: This patient is capable of making decisions on her own behalf.   6. Skin/Wound Care: Routine skin care checks   7. Fluids/Electrolytes/Nutrition:  Routine Is and Os and follow-up chemistries             -continue vitamin c   8: Hypertension: Controlled, monitor TID and prn             -continue lisinopril 20 mg daily  -6/13 controlled  10/22/2022    4:15 AM 10/21/2022    7:41 PM 10/21/2022    1:22 PM  Vitals with BMI  Systolic 111 101 161  Diastolic 99 63 81  Pulse 84 96 99      9: Bipolar disorder/PTSD/anxiety/depression: see #4  -Mood stable, continue current regimen   10: Morbid obesity: BMI 45.49             -Dietary counseling   11: Right BKA site infection s/p revision Dr. Lajoyce Corners 6/07             -continue wound Vac             -continue Duricef 500 mg BID for total of 36 doses  -6/12 Wound vac removed today  -6/13 incision without signs of infection noted, continue to monitor   12: ABLA: Last HBG 8.4 on 10/20/22.  follow-up CBC  -6/12 HGB up to 9.2, continue to monitor    13.  Suspected carpal tunnel syndrome bilateral upper extremities             -Advise continuing use of wrist braces at night, may need further evaluation on outpatient basis      LOS: 2 days A FACE TO FACE EVALUATION WAS PERFORMED  Fanny Dance 10/22/2022, 8:54 AM

## 2022-10-23 ENCOUNTER — Encounter: Payer: Self-pay | Admitting: Orthopedic Surgery

## 2022-10-23 DIAGNOSIS — G4700 Insomnia, unspecified: Secondary | ICD-10-CM

## 2022-10-23 DIAGNOSIS — M7989 Other specified soft tissue disorders: Secondary | ICD-10-CM | POA: Diagnosis not present

## 2022-10-23 DIAGNOSIS — F319 Bipolar disorder, unspecified: Secondary | ICD-10-CM | POA: Diagnosis not present

## 2022-10-23 DIAGNOSIS — T8743 Infection of amputation stump, right lower extremity: Secondary | ICD-10-CM | POA: Diagnosis not present

## 2022-10-23 DIAGNOSIS — G548 Other nerve root and plexus disorders: Secondary | ICD-10-CM

## 2022-10-23 DIAGNOSIS — I10 Essential (primary) hypertension: Secondary | ICD-10-CM | POA: Diagnosis not present

## 2022-10-23 DIAGNOSIS — R52 Pain, unspecified: Secondary | ICD-10-CM

## 2022-10-23 MED ORDER — DOXYCYCLINE HYCLATE 100 MG PO TABS
100.0000 mg | ORAL_TABLET | Freq: Two times a day (BID) | ORAL | Status: DC
  Administered 2022-10-23 – 2022-10-27 (×8): 100 mg via ORAL
  Filled 2022-10-23 (×10): qty 1

## 2022-10-23 MED ORDER — TRAZODONE HCL 50 MG PO TABS
50.0000 mg | ORAL_TABLET | Freq: Every day | ORAL | Status: DC
  Administered 2022-10-23 – 2022-10-26 (×4): 50 mg via ORAL
  Filled 2022-10-23 (×4): qty 1

## 2022-10-23 MED ORDER — GABAPENTIN 400 MG PO CAPS
800.0000 mg | ORAL_CAPSULE | Freq: Three times a day (TID) | ORAL | Status: DC
  Administered 2022-10-23 – 2022-10-27 (×12): 800 mg via ORAL
  Filled 2022-10-23 (×12): qty 2
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
## 2022-10-23 NOTE — Progress Notes (Signed)
Physical Therapy Session Note  Patient Details  Name: Taylor [name] MRN: [id number] Date of Birth: 1991-02-28  Today's Date: 10/23/2022 PT Individual Time: 4098-1191, 1425-1535 PT Individual Time Calculation (min): 60 min, 70 min   Short Term Goals: Week 1:  PT Short Term Goal 1 (Week 1): STG = LTG due to ELOS  Skilled Therapeutic Interventions/Progress Updates:    Session 1: Pt seated in w/c on arrival and agreeable to therapy. Pt reports pain managed on medication and with mirror therapy during session, unrated. Pt propelled w/c with BUE throughout session with supervision, managing w/c parts without assist. Pt performed alternating UE and standing balance exercise for improved muscle recovery.  Pt performed standing balance activity requiring reach outside BOS x 4 with each hand. Pt also performs bimanual tasks throughout with only intermittent need for UE support, demoes good use of ankle strategies for balance. CGA throughout, with one instance of mod A for LOB. Pt also performed alt overhead press and chess press x 10 with 6lb med ball, followed by overhead tricep press 4 x 10. Pt also participated in self directed mirror therapy for pain management. Pt returned to room and remained in w/c, was left with all needs in reach and alarm active.   Session 2: Pt seated EOB, reports tolerable pain levels throughout session, premedicated. Rest and positioning provided as needed. ambulatory transfer to w/c with  CGA and RW. Pt propelled w/c with BUE to gym for endurance and functional mobility. Alternated LE and UE exercise for improved muscle recovery x 4 bouts. Pt performed L hip hikes and RLE standing abduction for glute med strength for prosthetic prep. Pt used 5lb bar for rowing, circles forward and back, x 10 each. Pt used nustep on level 6 x 6 min for BUE and LLE strength and endurance. Pt returned to room and performed ambulatory transfer to bathroom, distant supervision for 3/3 toileting  tasks. Hand hygiene at w/c level. Pt then remained in w/c and was left with all needs in reach and alarm active.   Therapy Documentation Precautions:  Precautions Precautions: Fall Precaution Comments: R BKA Required Braces or Orthoses: Other Brace Other Brace: R limb guard Restrictions Weight Bearing Restrictions: Yes RLE Weight Bearing: Non weight bearing General:       Therapy/Group: Individual Therapy  Juluis Rainier 10/23/2022, 3:12 PM

## 2022-10-23 NOTE — IPOC Note (Signed)
Overall Plan of Care [facility]) Patient Details Name: Taylor [name] MRN: [id number] DOB: 1990/08/06  Admitting Diagnosis: Right BKA infection [facility])  Hospital Problems: Principal Problem:   Right BKA infection (HCC)     Functional Problem List: Nursing Safety, Endurance, Medication Management, Pain, Skin Integrity  PT Balance, Skin Integrity, Endurance, Safety  OT Balance, Endurance, Pain, Safety  SLP    TR         Basic ADL's: OT Bathing, Dressing     Advanced  ADL's: OT Simple Meal Preparation     Transfers: PT Bed Mobility, Bed to Chair, Customer service manager, Tub/Shower     Locomotion: PT Ambulation, Stairs     Additional Impairments: OT    SLP        TR      Anticipated Outcomes Item Anticipated Outcome  Self Feeding    Swallowing      Basic self-care  Mod I  Toileting  Mod I   Bathroom Transfers Mod I  Bowel/Bladder  n/a  Transfers  mod I  Locomotion  mod I  Communication     Cognition     Pain  < 4 with prns  Safety/Judgment  manage safety w cues   Therapy Plan: PT Intensity: Minimum of 1-2 x/day ,45 to 90 minutes PT Duration Estimated Length of Stay: 5-7 days OT Intensity: Minimum of 1-2 x/day, 45 to 90 minutes OT Frequency: 5 out of 7 days OT Duration/Estimated Length of Stay: 5-7 days     Team Interventions: Nursing Interventions Patient/Family Education, Medication Management, Discharge Planning, Pain Management, Disease Management/Prevention, Skin Care/Wound Management  PT interventions Ambulation/gait training, DME/adaptive equipment instruction, Community reintegration, Neuromuscular re-education, Museum/gallery curator, Psychosocial support, UE/LE Strength taining/ROM, Wheelchair propulsion/positioning, Warden/ranger, Discharge planning, Pain management, Therapeutic Activities, UE/LE Coordination activities, Disease management/prevention, Functional mobility training, Therapeutic Exercise, Splinting/orthotics, Patient/family  education  OT Interventions Balance/vestibular training, Discharge planning, Disease mangement/prevention, DME/adaptive equipment instruction, Pain management, Functional mobility training, Self Care/advanced ADL retraining, Skin care/wound managment, Therapeutic Exercise, Patient/family education, Therapeutic Activities, UE/LE Strength taining/ROM  SLP Interventions    TR Interventions    SW/CM Interventions Discharge Planning, Psychosocial Support, Patient/Family Education   Barriers to Discharge MD  Medical stability, Home enviroment access/loayout, and Wound care  Nursing Decreased caregiver support, Home environment access/layout 1 level flight of stairs to entry to apt, left rail, mother to assist. Pt uses shower chair method to get up flight of stairs. Fell 3x in one day, lost balance and some challenges getting around corners in bedroom due to inaccessibility.  PT Inaccessible home environment, Decreased caregiver support, Home environment access/layout, Weight bearing restrictions    OT Inaccessible home environment, Home environment access/layout, Wound Care, Weight bearing restrictions    SLP      SW Behavior Wrong shoe and safety concerns-hx falling   Team Discharge Planning: Destination: PT-Home ,OT- Home , SLP-  Projected Follow-up: PT-Home health PT, OT-  Home health OT, SLP-  Projected Equipment Needs: PT- , OT- To be determined, SLP-  Equipment Details: PT-pt has RW, BSC, shower chair, wheelchair from previous stay in May, OT-  Patient/family involved in discharge planning: PT- Patient,  OT-Patient, SLP-   MD ELOS: 5-7 Medical Rehab Prognosis:  Excellent Assessment: The patient has been admitted for CIR therapies with the diagnosis of 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 Eual Fines due to cellulitis and wound dehiscence due to multiple falls . The team will be  addressing functional mobility, strength, stamina, balance, safety,  adaptive techniques and equipment, self-care, bowel and bladder mgt, patient and caregiver education. Goals have been set at Mod I. Anticipated discharge destination is home.        See Team Conference Notes for weekly updates to the plan of care

## 2022-10-23 NOTE — Progress Notes (Addendum)
PROGRESS NOTE   Subjective/Complaints: Patient continues to have some drainage from her incision, Dr. Huel Cote reviewed image yesterday she was noted to have some increased gapping from swelling.  Doxycycline was started in addition to smaller shrinker and leg elevation was recommended.  She reports pain is overall controlled with current regimen and she was able to sleep better last night.   Review of Systems  Constitutional:  Negative for chills, fever and malaise/fatigue.  HENT:  Negative for congestion.   Eyes:  Negative for double vision.  Respiratory:  Negative for shortness of breath.   Cardiovascular:  Negative for chest pain.  Gastrointestinal:  Negative for abdominal pain, nausea and vomiting.  Genitourinary: Negative.   Musculoskeletal:  Positive for back pain and joint pain.  Skin:  Negative for rash.  Neurological:  Positive for tingling and sensory change. Negative for focal weakness.  Psychiatric/Behavioral:  The patient is nervous/anxious and has insomnia.     Objective:   VAS [place] LOWER EXTREMITY VENOUS (DVT)  Result Date: 10/22/2022  Lower Venous DVT Study Patient Name:  Taylor [name]  Date of Exam:   10/22/2022 Medical Rec #: [id number]           Accession #:    [id number] Date of Birth: [birth year]            Patient Gender: F Patient Age:   32 years Exam Location:  [facility] Procedure:      VAS [place] LOWER EXTREMITY VENOUS (DVT) Referring Phys: Fanny Dance --------------------------------------------------------------------------------  Indications: Edema.  Limitations: Poor ultrasound/tissue interface. Comparison Study: No previous LLE venous exams. Performing Technologist: Ernestene Mention RVT, RDMS  Examination Guidelines: A complete evaluation includes B-mode imaging, spectral Doppler, color Doppler, and power Doppler as needed of all accessible portions of each vessel. Bilateral testing is considered an  integral part of a complete examination. Limited examinations for reoccurring indications may be performed as noted. The reflux portion of the exam is performed with the patient in reverse Trendelenburg.  +-----+---------------+---------+-----------+----------+--------------+ RIGHTCompressibilityPhasicitySpontaneityPropertiesThrombus Aging +-----+---------------+---------+-----------+----------+--------------+ CFV  Full           Yes      Yes                                 +-----+---------------+---------+-----------+----------+--------------+   +---------+---------------+---------+-----------+----------+--------------+ LEFT     CompressibilityPhasicitySpontaneityPropertiesThrombus Aging +---------+---------------+---------+-----------+----------+--------------+ CFV      Full           Yes      Yes                                 +---------+---------------+---------+-----------+----------+--------------+ SFJ      Full                                                        +---------+---------------+---------+-----------+----------+--------------+ FV Prox  Full  Yes      Yes                                 +---------+---------------+---------+-----------+----------+--------------+ FV Mid   Full           Yes      Yes                                 +---------+---------------+---------+-----------+----------+--------------+ FV DistalFull           Yes      Yes                                 +---------+---------------+---------+-----------+----------+--------------+ PFV      Full                                                        +---------+---------------+---------+-----------+----------+--------------+ POP      Full           Yes      Yes                                 +---------+---------------+---------+-----------+----------+--------------+ PTV      Full                                                         +---------+---------------+---------+-----------+----------+--------------+ PERO     Full                                                        +---------+---------------+---------+-----------+----------+--------------+     Summary: RIGHT: - No evidence of common femoral vein obstruction.  LEFT: - There is no evidence of deep vein thrombosis in the lower extremity.  - No cystic structure found in the popliteal fossa.  *See table(s) above for measurements and observations.    Preliminary    Recent Labs    10/21/22 0620  WBC 8.2  HGB 9.2*  HCT 27.8*  PLT 279    Recent Labs    10/21/22 0620  NA 138  K 3.8  CL 103  CO2 25  GLUCOSE 104*  BUN 16  CREATININE 0.70  CALCIUM 9.4     Intake/Output Summary (Last 24 hours) at 10/23/2022 0813 Last data filed at 10/23/2022 0745 Gross per 24 hour  Intake 1197 ml  Output --  Net 1197 ml         Physical Exam: Vital Signs Blood pressure 112/86, pulse 95, temperature 98.1 F (36.7 C), temperature source Oral, resp. rate 16, height 5\' 4"  (1.626 m), weight 116 kg, SpO2 98 %.  General: Alert and oriented x 3, No apparent distress, working with therapy HEENT: Head is normocephalic, atraumatic, PERRLA, EOMI, sclera anicteric, oral mucosa pink and moist, dentition  intact, ext ear canals clear,  Neck: Supple without JVD or lymphadenopathy Heart: Reg rate and rhythm. No murmurs rubs or gallops Chest: CTA bilaterally without wheezes, rales, or rhonchi; no distress Abdomen: Soft, non-tender, non-distended, bowel sounds positive. Extremities: R BKA wound vac has been removed LLE swelling  Psych: Pt's affect is appropriate. Pt is cooperative. Pleasant Skin: warm and dry Please see image Neuro: Alert and oriented x 3, follows commands, cranial nerves II through XII grossly intact, normal speech and language Strength 5/5 in b/l UE and LLE Sensation intact light touch in all 4 extremities Tinels positive b/l wrist  Musculoskeletal: no  abnormal tone noted, no joint swelling noted  Blood pressure 112/86, pulse 95, temperature 98.1 F (36.7 C), temperature source Oral, resp. rate 16, height 5\' 4"  (1.626 m), weight 116 kg, SpO2 98 %.  Medical Problem List and Plan: 1. 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 Eual Fines due to cellulitis and wound dehiscence due to multiple falls             -patient may not shower             -ELOS/Goals: 5 to 7 days, mod I goals with PT and OT             -Continue CIR  -Reports prior falls related to poor quality walker obtained from [facility], she has had this replaced  -Estimated DC 6/18  -Smaller size shrinker ordered yesterday  2.  Antithrombotics: -DVT/anticoagulation:  Pharmaceutical: Lovenox             -antiplatelet therapy: none  -Check vas U/s for  LLE swelling - negative   3. Pain Management: Tylenol, oxycodone, Robaxin as needed             -continue Neurontin 600 mg TID             -continue Flexeril 5 mg TID  -6/14 increase gabapentin to 800mg  TID for phantom pain  6/15 she reports phantom pain is controlled, denies side effects with gabapentin.  She continues to have incisional pain controlled with current regimen of oxycodone and muscle relaxers   4. Mood/Behavior/Sleep: LCSW to evaluate and provide emotional support             -antipsychotic agents: continue Vraylar 1.5 mg daily             -continue Wellbutrin, Cymbalta   5. Neuropsych/cognition: This patient is capable of making decisions on her own behalf.   6. Skin/Wound Care: Routine skin care checks   7. Fluids/Electrolytes/Nutrition: Routine Is and Os and follow-up chemistries             -continue vitamin c   8: Hypertension: Controlled, monitor TID and prn             -continue lisinopril 20 mg daily  -6/14 controlled but occasional soft BP, consider decrease lisinopril dose if continues  -6/15 will decrease lisinopril dose to 10mg   daily     10/23/2022    5:17 AM 10/22/2022    7:27 PM 10/22/2022    3:43 PM  Vitals with BMI  Systolic 112 92 100  Diastolic 86 58 68  Pulse 95 96 89      9: Bipolar disorder/PTSD/anxiety/depression: see #4  -Mood stable, continue current regimen   10: Morbid obesity: BMI 45.49             -Dietary counseling  11: Right BKA site infection s/p revision Dr. Lajoyce Corners 6/07             -continue wound Vac             -continue Duricef 500 mg BID for total of 36 doses  -6/12 Wound vac removed today  -6/13 incision without signs of infection noted, continue to monitor  -6/14 continue to encourage use of limb protector  -6/14 addendum Dr. Lajoyce Corners started doxycycline 100mg  BID and 3xl stump shrinker , appreciate   -6/15 encourage leg elevation, use of shrinker monitor incision   12: ABLA: Last HBG 8.4 on 10/20/22.  follow-up CBC  -6/12 HGB up to 9.2, continue to monitor    13.  Suspected carpal tunnel syndrome bilateral upper extremities             -Advise continuing use of wrist braces at night, may need further evaluation on outpatient basis   14. Insomnia  -Schedule trazodone  -6/15 slept better yesterday after taking medications    LOS: 3 days A FACE TO FACE EVALUATION WAS PERFORMED  Fanny Dance 10/23/2022, 8:13 AM

## 2022-10-23 NOTE — Progress Notes (Signed)
Occupational Therapy Session Note  Patient Details  Name: Taylor [name] MRN: [id number] Date of Birth: 12/02/1990  Today's Date: 10/23/2022 OT Individual Time: 3244-0102 OT Individual Time Calculation (min): 58 min    Short Term Goals: Week 1:  OT Short Term Goal 1 (Week 1): STGs=LTGs due to patient's length of stay.  Skilled Therapeutic Interventions/Progress Updates:  Pt received sitting EOB for skilled OT session with focus on BADL participation. Pt agreeable to interventions, demonstrating overall pleasant mood. Pt reported 5-6/10 surgical pain. OT offering intermediate rest breaks and positioning suggestions throughout session to address pain/fatigue and maximize participation/safety in session.   Pt performs a stand-pivot transfers with supervision from EOB>WC. At sink-side, pt completes sponge-bathing with overall setup, using sink-side to stand and address LB. Pt dependent for hair washing, using tray. Surgeon contacted for further instruction in regards to bathing at shower-level.   Pt remained sitting in WC, all immediate needs met at end of session. Pt continues to be appropriate for skilled OT intervention to promote further functional independence.   Therapy Documentation Precautions:  Precautions Precautions: Fall Precaution Comments: R BKA Required Braces or Orthoses: Other Brace Other Brace: R limb guard Restrictions Weight Bearing Restrictions: Yes RLE Weight Bearing: Non weight bearing   Therapy/Group: Individual Therapy  Lou Cal, OTR/L, MSOT  10/23/2022, 6:11 AM

## 2022-10-24 DIAGNOSIS — F319 Bipolar disorder, unspecified: Secondary | ICD-10-CM | POA: Diagnosis not present

## 2022-10-24 DIAGNOSIS — T8743 Infection of amputation stump, right lower extremity: Secondary | ICD-10-CM | POA: Diagnosis not present

## 2022-10-24 DIAGNOSIS — I10 Essential (primary) hypertension: Secondary | ICD-10-CM | POA: Diagnosis not present

## 2022-10-24 DIAGNOSIS — M7989 Other specified soft tissue disorders: Secondary | ICD-10-CM | POA: Diagnosis not present

## 2022-10-24 MED ORDER — LISINOPRIL 5 MG PO TABS
10.0000 mg | ORAL_TABLET | Freq: Every day | ORAL | Status: DC
  Administered 2022-10-25 – 2022-10-27 (×3): 10 mg via ORAL
  Filled 2022-10-24 (×3): qty 2

## 2022-10-24 NOTE — Progress Notes (Signed)
Occupational Therapy Session Note  Patient Details  Name: Taylor [name] MRN: [id number] Date of Birth: 1991-03-02  Today's Date: 10/24/2022 OT Individual Time: 0710-0755 OT Individual Time Calculation (min): 45 min  and Today's Date: 10/24/2022 OT Missed Time: 10 Minutes Missed Time Reason: Other (comment) (Eating breakfast.)   Short Term Goals: Week 1:  OT Short Term Goal 1 (Week 1): STGs=LTGs due to patient's length of stay.  Skilled Therapeutic Interventions/Progress Updates:  Pt received sitting EOB, eating breakfast, pt misses ~10 mins of skilled OT intervention. Session focused on BADL participation at shower-level. Pt cleared to shower by MD Lajoyce Corners. Pt agreeable to interventions, demonstrating overall pleasant mood. Pt with un-rated pain, pre-medicated. OT offering intermediate rest breaks and positioning suggestions throughout session to address pain/fatigue and maximize participation/safety in session.   Pt step-hops from EOB<>TTB in shower with close supervision + RW, performing all functional transfers with similar level of assistance. Pt's residual limb water-proofed, pt overall setup for remainder of shower. Pt dresses at EOB with supervision, using RW for LB clothing management.   Pt remained sitting in WC at sink-side with all immediate needs met at end of session. Pt continues to be appropriate for skilled OT intervention to promote further functional independence.   Therapy Documentation Precautions:  Precautions Precautions: Fall Precaution Comments: R BKA Required Braces or Orthoses: Other Brace Other Brace: R limb guard Restrictions Weight Bearing Restrictions: Yes RLE Weight Bearing: Non weight bearing   Therapy/Group: Individual Therapy  Lou Cal, OTR/L, MSOT  10/24/2022, 5:41 AM

## 2022-10-24 NOTE — Progress Notes (Signed)
PROGRESS NOTE   Subjective/Complaints: Patient continues to have some drainage from her incision, Dr. Huel Cote reviewed image yesterday she was noted to have some increased gapping from swelling.  Doxycycline was started in addition to smaller shrinker and leg elevation was recommended.  She reports pain is overall controlled with current regimen and she was able to sleep better last night.   Review of Systems  Constitutional:  Negative for chills, fever and malaise/fatigue.  HENT:  Negative for congestion.   Eyes:  Negative for double vision.  Respiratory:  Negative for shortness of breath.   Cardiovascular:  Negative for chest pain and palpitations.  Gastrointestinal:  Negative for abdominal pain, nausea and vomiting.  Genitourinary: Negative.   Musculoskeletal:  Positive for back pain and joint pain.  Skin:  Negative for rash.  Neurological:  Positive for tingling and sensory change. Negative for focal weakness.  Psychiatric/Behavioral:  The patient is nervous/anxious and has insomnia.     Objective:   VAS [place] LOWER EXTREMITY VENOUS (DVT)  Result Date: 10/22/2022  Lower Venous DVT Study Patient Name:  GAL CHMIEL  Date of Exam:   10/22/2022 Medical Rec #: [id number]           Accession #:    [id number] Date of Birth: December 21, 1990            Patient Gender: F Patient Age:   32 years Exam Location:  [facility] Procedure:      VAS [place] LOWER EXTREMITY VENOUS (DVT) Referring Phys: Fanny Dance --------------------------------------------------------------------------------  Indications: Edema.  Limitations: Poor ultrasound/tissue interface. Comparison Study: No previous LLE venous exams. Performing Technologist: Ernestene Mention RVT, RDMS  Examination Guidelines: A complete evaluation includes B-mode imaging, spectral Doppler, color Doppler, and power Doppler as needed of all accessible portions of each vessel. Bilateral testing is  considered an integral part of a complete examination. Limited examinations for reoccurring indications may be performed as noted. The reflux portion of the exam is performed with the patient in reverse Trendelenburg.  +-----+---------------+---------+-----------+----------+--------------+ RIGHTCompressibilityPhasicitySpontaneityPropertiesThrombus Aging +-----+---------------+---------+-----------+----------+--------------+ CFV  Full           Yes      Yes                                 +-----+---------------+---------+-----------+----------+--------------+   +---------+---------------+---------+-----------+----------+--------------+ LEFT     CompressibilityPhasicitySpontaneityPropertiesThrombus Aging +---------+---------------+---------+-----------+----------+--------------+ CFV      Full           Yes      Yes                                 +---------+---------------+---------+-----------+----------+--------------+ SFJ      Full                                                        +---------+---------------+---------+-----------+----------+--------------+ FV Prox  Full  Yes      Yes                                 +---------+---------------+---------+-----------+----------+--------------+ FV Mid   Full           Yes      Yes                                 +---------+---------------+---------+-----------+----------+--------------+ FV DistalFull           Yes      Yes                                 +---------+---------------+---------+-----------+----------+--------------+ PFV      Full                                                        +---------+---------------+---------+-----------+----------+--------------+ POP      Full           Yes      Yes                                 +---------+---------------+---------+-----------+----------+--------------+ PTV      Full                                                         +---------+---------------+---------+-----------+----------+--------------+ PERO     Full                                                        +---------+---------------+---------+-----------+----------+--------------+     Summary: RIGHT: - No evidence of common femoral vein obstruction.  LEFT: - There is no evidence of deep vein thrombosis in the lower extremity.  - No cystic structure found in the popliteal fossa.  *See table(s) above for measurements and observations.    Preliminary    No results for input(s): "WBC", "HGB", "HCT", "PLT" in the last 72 hours.  No results for input(s): "NA", "K", "CL", "CO2", "GLUCOSE", "BUN", "CREATININE", "CALCIUM" in the last 72 hours.   Intake/Output Summary (Last 24 hours) at 10/24/2022 1113 Last data filed at 10/24/2022 0700 Gross per 24 hour  Intake 1274 ml  Output --  Net 1274 ml         Physical Exam: Vital Signs Blood pressure 108/70, pulse 85, temperature 97.7 F (36.5 C), temperature source Oral, resp. rate 18, height 5\' 4"  (1.626 m), weight 116 kg, SpO2 97 %.  General: Alert and oriented x 3, No apparent distress, working with therapy HEENT: Head is normocephalic, atraumatic, PERRLA, EOMI, sclera anicteric, oral mucosa pink and moist, dentition intact, ext ear canals clear,  Neck: Supple without JVD or lymphadenopathy Heart: Reg rate and rhythm. No murmurs rubs or gallops Chest: CTA  bilaterally without wheezes, rales, or rhonchi; no distress Abdomen: Soft, non-tender, non-distended, bowel sounds positive. Extremities: R BKA wound vac has been removed LLE swelling  Psych: Pt's affect is appropriate. Pt is cooperative. Pleasant Skin: warm and dry Please see image Neuro: Alert and oriented x 3, follows commands, cranial nerves II through XII grossly intact, normal speech and language Strength 5/5 in b/l UE and LLE Sensation intact light touch in all 4 extremities Tinels positive b/l wrist  Musculoskeletal: no abnormal tone  noted, no joint swelling noted  Blood pressure 108/70, pulse 85, temperature 97.7 F (36.5 C), temperature source Oral, resp. rate 18, height 5\' 4"  (1.626 m), weight 116 kg, SpO2 97 %.  Medical Problem List and Plan: 1. 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 Eual Fines due to cellulitis and wound dehiscence due to multiple falls             -patient may not shower             -ELOS/Goals: 5 to 7 days, mod I goals with PT and OT             -Continue CIR  -Reports prior falls related to poor quality walker obtained from [facility], she has had this replaced  -Estimated DC 6/18  -Smaller size shrinker ordered yesterday  2.  Antithrombotics: -DVT/anticoagulation:  Pharmaceutical: Lovenox             -antiplatelet therapy: none  -Check vas U/s for  LLE swelling - negative   3. Pain Management: Tylenol, oxycodone, Robaxin as needed             -continue Neurontin 600 mg TID             -continue Flexeril 5 mg TID  -6/14 increase gabapentin to 800mg  TID for phantom pain  6/15 she reports phantom pain is controlled, denies side effects with gabapentin.  She continues to have incisional pain controlled with current regimen of oxycodone and muscle relaxers   4. Mood/Behavior/Sleep: LCSW to evaluate and provide emotional support             -antipsychotic agents: continue Vraylar 1.5 mg daily             -continue Wellbutrin, Cymbalta   5. Neuropsych/cognition: This patient is capable of making decisions on her own behalf.   6. Skin/Wound Care: Routine skin care checks   7. Fluids/Electrolytes/Nutrition: Routine Is and Os and follow-up chemistries             -continue vitamin c   8: Hypertension: Controlled, monitor TID and prn             -continue lisinopril 20 mg daily  -6/14 controlled but occasional soft BP, consider decrease lisinopril dose if continues  -6/15 will decrease lisinopril dose to 10mg  daily      10/24/2022    2:55 AM 10/23/2022    7:56 PM 10/23/2022    1:00 PM  Vitals with BMI  Systolic 108 98 108  Diastolic 70 62 76  Pulse 85 98 89      9: Bipolar disorder/PTSD/anxiety/depression: see #4  -Mood stable, continue current regimen   10: Morbid obesity: BMI 45.49             -Dietary counseling   11: Right BKA site infection s/p revision Dr. Lajoyce Corners 6/07             -  continue wound Vac             -continue Duricef 500 mg BID for total of 36 doses  -6/12 Wound vac removed today  -6/13 incision without signs of infection noted, continue to monitor  -6/14 continue to encourage use of limb protector  -6/14 addendum Dr. Lajoyce Corners started doxycycline 100mg  BID and 3xl stump shrinker , appreciate   -6/15 encourage leg elevation, use of shrinker monitor incision   12: ABLA: Last HBG 8.4 on 10/20/22.  follow-up CBC  -6/12 HGB up to 9.2, continue to monitor    13.  Suspected carpal tunnel syndrome bilateral upper extremities             -Advise continuing use of wrist braces at night, may need further evaluation on outpatient basis   14. Insomnia  -Schedule trazodone  -6/15 slept better yesterday after taking medications    LOS: 4 days A FACE TO FACE EVALUATION WAS PERFORMED  Fanny Dance 10/24/2022, 11:13 AM

## 2022-10-24 NOTE — Progress Notes (Signed)
Occupational Therapy Discharge Summary  Patient Details  Name: Taylor [name] MRN: [id number] Date of Birth: 1991/04/07  Date of Discharge from OT service:October 26, 2022  {CHL IP REHAB OT TIME CALCULATIONS:304400400}   Patient has met {NUMBERS 0-12:18577} of {NUMBERS 0-12:18577} long term goals due to {due WU:9811914}.  Patient to discharge at overall {LOA:3049010} level.  Patient's care partner {care partner:[id number]} to provide the necessary {assistance:3041652} assistance at discharge.    Reasons goals not met: ***  Recommendation:  Patient will benefit from ongoing skilled OT services in outpatient setting to continue to advance functional skills in the area of iADL and Vocation.  Equipment: No equipment provided, pt owns BSC and TTB from previous admission.   Reasons for discharge: treatment goals met and discharge from hospital  Patient/family agrees with progress made and goals achieved: Yes  OT Discharge Precautions/Restrictions  Precautions Precautions: Fall Precaution Comments: R BKA Required Braces or Orthoses: Other Brace Other Brace: R limb guard Restrictions Weight Bearing Restrictions: Yes RLE Weight Bearing: Non weight bearing ADL ADL Eating: Independent Where Assessed-Eating: Edge of bed Grooming: Modified independent Where Assessed-Grooming: Edge of bed Upper Body Bathing: Modified independent Where Assessed-Upper Body Bathing: Shower Lower Body Bathing: Modified independent Where Assessed-Lower Body Bathing: Shower Upper Body Dressing: Modified independent (Device) Where Assessed-Upper Body Dressing: Edge of bed Lower Body Dressing: Modified independent Where Assessed-Lower Body Dressing: Edge of bed Toileting: Modified independent Where Assessed-Toileting: Bedside Commode Toilet Transfer: Modified independent Toilet Transfer Method: Surveyor, minerals: Gaffer: Modified independent Tub/Shower  Transfer Method: Ship broker: Insurance underwriter: Modified independent Film/video editor Method: Designer, industrial/product: Emergency planning/management officer Vision Baseline Vision/History: 0 No visual deficits Patient Visual Report: No change from baseline Perception  Perception: Within Functional Limits Praxis Praxis: Intact Cognition Cognition Overall Cognitive Status: Within Functional Limits for tasks assessed Arousal/Alertness: Awake/alert Orientation Level: Person;Place;Situation Memory: Appears intact Awareness: Appears intact Problem Solving: Appears intact Safety/Judgment: Appears intact Sensation Sensation Light Touch: Impaired Detail Peripheral sensation comments: Pt reports carpal tunnel in bilateral wrists, numbness present in digits 1-3., decreased sensation in distal residual limb, pt reports occasional phantom pain Light Touch Impaired Details: Impaired RUE;Impaired LUE Hot/Cold: Appears Intact Proprioception: Appears Intact Stereognosis: Appears Intact Coordination Gross Motor Movements are Fluid and Coordinated: No Fine Motor Movements are Fluid and Coordinated: No Coordination and Movement Description: Limited by R BKA and decreased sensitivity due to carpal tunnel syndrome. Motor  Motor Motor: Within Functional Limits Mobility  Bed Mobility Supine to Sit: Independent Sit to Supine: Independent Transfers Sit to Stand: Independent with assistive device Stand to Sit: Independent with assistive device  Trunk/Postural Assessment  Cervical Assessment Cervical Assessment: Within Functional Limits Thoracic Assessment Thoracic Assessment: Within Functional Limits Lumbar Assessment Lumbar Assessment: Within Functional Limits Postural Control Postural Control: Within Functional Limits  Balance Balance Balance Assessed: Yes Static Sitting Balance Static Sitting - Balance Support: Feet supported;No upper  extremity supported Static Sitting - Level of Assistance: 7: Independent Dynamic Sitting Balance Dynamic Sitting - Balance Support: During functional activity Dynamic Sitting - Level of Assistance: 6: Modified independent (Device/Increase time) Dynamic Sitting - Balance Activities: Lateral lean/weight shifting;Forward lean/weight shifting Static Standing Balance Static Standing - Balance Support: During functional activity Static Standing - Level of Assistance: 6: Modified independent (Device/Increase time) Dynamic Standing Balance Dynamic Standing - Balance Support: During functional activity;Right upper extremity supported Dynamic Standing - Level of Assistance: 6: Modified independent (Device/Increase time) Dynamic Standing - Balance  Activities: Lateral lean/weight shifting;Reaching for objects Extremity/Trunk Assessment RUE Assessment RUE Assessment: Within Functional Limits LUE Assessment LUE Assessment: Within Functional Limits   Lou Cal, OTR/L, MSOT  10/24/2022, 7:38 AM

## 2022-10-25 DIAGNOSIS — F319 Bipolar disorder, unspecified: Secondary | ICD-10-CM | POA: Diagnosis not present

## 2022-10-25 DIAGNOSIS — M7989 Other specified soft tissue disorders: Secondary | ICD-10-CM | POA: Diagnosis not present

## 2022-10-25 DIAGNOSIS — I10 Essential (primary) hypertension: Secondary | ICD-10-CM | POA: Diagnosis not present

## 2022-10-25 DIAGNOSIS — T8743 Infection of amputation stump, right lower extremity: Secondary | ICD-10-CM | POA: Diagnosis not present

## 2022-10-25 NOTE — Progress Notes (Signed)
PROGRESS NOTE   Subjective/Complaints: She reports that pain is doing better with higher dose of gabapentin.  She denies any sedation with this medication, feels like she is more rested now.  She is happy this morning because she feels like she is having less drainage from her incision.  Last BM today  Review of Systems  Constitutional:  Negative for fever.  HENT:  Negative for congestion.   Eyes:  Negative for double vision.  Respiratory:  Negative for shortness of breath.   Cardiovascular:  Negative for chest pain and palpitations.  Gastrointestinal:  Negative for abdominal pain, constipation, diarrhea, nausea and vomiting.  Genitourinary: Negative.   Musculoskeletal:  Positive for back pain and joint pain.  Skin:  Negative for rash.  Neurological:  Positive for tingling and sensory change. Negative for focal weakness.  Psychiatric/Behavioral:  The patient is nervous/anxious and has insomnia.     Objective:   No results found. No results for input(s): "WBC", "HGB", "HCT", "PLT" in the last 72 hours.  No results for input(s): "NA", "K", "CL", "CO2", "GLUCOSE", "BUN", "CREATININE", "CALCIUM" in the last 72 hours.   Intake/Output Summary (Last 24 hours) at 10/25/2022 1042 Last data filed at 10/25/2022 0700 Gross per 24 hour  Intake 715 ml  Output --  Net 715 ml         Physical Exam: Vital Signs Blood pressure (!) 121/96, pulse (!) 102, temperature (!) 97.5 F (36.4 C), temperature source Oral, resp. rate 18, height 5\' 4"  (1.626 m), weight 116 kg, SpO2 94 %.  General: Alert and oriented x 3, No apparent distress, sitting in wheelchair HEENT: [place], AT, MMM Neck: Supple without JVD or lymphadenopathy Heart: Reg rate and rhythm. No murmurs rubs or gallops Chest: CTA bilaterally without wheezes, rales, or rhonchi; no distress Abdomen: Soft, non-tender, non-distended, bowel sounds positive. Extremities: R BKA wound vac  has been removed-small amount of nonpurulent drainage noted on gauze LLE swelling  Psych: Pt's affect is appropriate. Pt is cooperative. Pleasant Skin: warm and dry Please see image Neuro: Alert and oriented x 3, follows commands, cranial nerves II through XII grossly intact, normal speech and language Strength 5/5 in b/l UE and LLE Sensation intact light touch in all 4 extremities Tinels positive b/l wrist  Musculoskeletal: no abnormal tone noted, no joint swelling noted  Blood pressure (!) 121/96, pulse (!) 102, temperature (!) 97.5 F (36.4 C), temperature source Oral, resp. rate 18, height 5\' 4"  (1.626 m), weight 116 kg, SpO2 94 %.  Medical Problem List and Plan: 1. 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 Eual Fines due to cellulitis and wound dehiscence due to multiple falls             -patient may not shower             -ELOS/Goals: 5 to 7 days, mod I goals with PT and OT             -Continue CIR  -Reports prior falls related to poor quality walker obtained from [facility], she has had this replaced  -Estimated DC 6/18  -6/16 she  is waiting on her smaller Sankar to be delivered  2.  Antithrombotics: -DVT/anticoagulation:  Pharmaceutical: Lovenox             -antiplatelet therapy: none  -Check vas U/s for  LLE swelling - negative   3. Pain Management: Tylenol, oxycodone, Robaxin as needed             -continue Neurontin 600 mg TID             -continue Flexeril 5 mg TID  -6/14 increase gabapentin to 800mg  TID for phantom pain  6/15 she reports phantom pain is controlled, denies side effects with gabapentin.  She continues to have incisional pain controlled with current regimen of oxycodone and muscle relaxers  6/16 pain improved with crease gabapentin dose   4. Mood/Behavior/Sleep: LCSW to evaluate and provide emotional support             -antipsychotic agents: continue Vraylar 1.5 mg daily              -continue Wellbutrin, Cymbalta   5. Neuropsych/cognition: This patient is capable of making decisions on her own behalf.   6. Skin/Wound Care: Routine skin care checks   7. Fluids/Electrolytes/Nutrition: Routine Is and Os and follow-up chemistries             -continue vitamin c   8: Hypertension: Controlled, monitor TID and prn             -continue lisinopril 20 mg daily  -6/14 controlled but occasional soft BP, consider decrease lisinopril dose if continues  -6/15 will decrease lisinopril dose to 10mg  daily  -6/16 blood pressure stable continue to monitor.  Patient plans to schedule follow-up with PCP shortly after discharge     10/24/2022    7:28 PM 10/24/2022    1:05 PM 10/24/2022    2:55 AM  Vitals with BMI  Systolic 121 108 846  Diastolic 96 70 70  Pulse 102 93 85      9: Bipolar disorder/PTSD/anxiety/depression: see #4  -She feels like her mood is getting better gradually, she feels like it is better this admission the last admission.   10: Morbid obesity: BMI 45.49             -Dietary counseling   11: Right BKA site infection s/p revision Dr. Lajoyce Corners 6/07             -continue wound Vac             -continue Duricef 500 mg BID for total of 36 doses  -6/12 Wound vac removed today  -6/13 incision without signs of infection noted, continue to monitor  -6/14 continue to encourage use of limb protector  -6/14 addendum Dr. Lajoyce Corners started doxycycline 100mg  BID and 3xl stump shrinker , appreciate   -6/16 incision appears unchanged today from yesterday, continue to encourage leg elevation and use of shrinker   12: ABLA: Last HBG 8.4 on 10/20/22.  follow-up CBC  -6/12 HGB up to 9.2  Recheck tomorrow   13.  Suspected carpal tunnel syndrome bilateral upper extremities             -Advise continuing use of wrist braces at night, may need further evaluation on outpatient basis   14. Insomnia  -Schedule trazodone  -6/15 slept better yesterday after taking medications     LOS: 5 days A FACE TO FACE EVALUATION WAS PERFORMED  Fanny Dance 10/25/2022, 10:42 AM

## 2022-10-25 NOTE — Progress Notes (Signed)
Physical Therapy Session Note  Patient Details  Name: Taylor [name] MRN: [id number] Date of Birth: 08-23-1990  Today's Date: 10/25/2022 PT Individual Time: 0800-0902, 4098-1191 PT Individual Time Calculation (min): 62 min, 74 min   Short Term Goals: Week 1:  PT Short Term Goal 1 (Week 1): STG = LTG due to ELOS  Skilled Therapeutic Interventions/Progress Updates:      Treatment Session 1  Pt seated in WC and brushing teeth and donning make up at wheelchair level. Pt agreeable to therapy. Pt in very good mood after having shower and having family visit yesterday. Pt reports minimal pain today, premedicated. Pt reports she slept good last night.   Pt wearing nike slide in WC and not wearing limb protector. Emphasizd need to wear limb protector when OOB to protect incision. Pt donned limb protector with mod I. Education provided on okay to wear nike slide while propelling WC but retierated need for non slip sock or tennis shoe when standing and walking with RW for safety, pt verbalized understanding.   Pt ambulated 2x10 feet to and from bathroom and performed ambulatory transfer on toilet with RW and mod I, pt demos and reports recall of need to slow down for safety. Pt performed pericare while sitting with mod I, pt stood with RW and donned and doffed pants with intermittent assist on RW with mod I, pt reports and demos recall of need to pause and reset/steady self as needed for improved balance/safety.   Pt self propelled WC from room to main gym with mod I, pt demos improved recall of energy conservation techniques from previous sessions.   Pt performed the following acitivites to increase pt dynamic balance, and hip and ankle strategy, anticipatory postural adjustments and B UE/LE strength. CGA provided throughout for stabilizing, with pt utilizing intermittent UE support on RW as needed for stability, verbal cues provided for targeting specific muscles.    Standing on firm surface    1x15 shoulder press with 5# tidal tank   1x15 chest press with 5# tidal tank  1x15 tricep extension with 4# dowel rod Standing on airex pad  1x15 shoulder press 5# tidal tank   1x15 chest press with 5# tidal tank   1x15 tricep extension with 5# tidal tank    Pt seated in WC at end of session with all needs within reach and seatbelt alarm on.    Treatment Session 2   Pt seated EOB upon arrival. Pt agreeable to therapy. Pt reports R LE pain, premedicated, did not interfere with therapy, provided rest breaks and repositioning as needed.   Pt doffed nike slide and donned non slip sock with mod I. Pt donned limb guard with mod I. Top strap on limb guard falling off, able to reattach but falls off with activity, therapist reached out to [employer] to determine if need to order new limb guard of if there is ability to fix strap.  Discussed with pt home set up. Pt reports flight of stairs to enter/exit apartment, then landing, 2 steps, and another landing, then 10 feet of concrete to get to car. Pt reports she typically walks in grass with assistance from dad to avoid navigating the additional steps or dad will stabilize RW with descending curb but pt not comfortable ascending.   Pt walked 10 feet on grass with RW and CGA for stability, pt demos good technique with RW and hop to gait, and recall of cues for taking one's time. Pt ascended/descended outdoor curb x3  with RW and CGA, verbal cues provided for technique with ascending backwards and descending forwards, and for safety with RW with ascending on first trial, pt demos improved carry over for repeat trials.   Pt expressed concerns about navigating narrow turns with wheelchair and expressed wanting to practice, pt self propelled WC in gift shop with B UE and supervision, pt demos good safety, and clearance of B LE with navigating turns. Pt demos good recall of backing up into elevator/compact spaces versus entering forward then turning.   Upon  entering main gym, pt reports 8 inch curb step in gym is the height of pt step at home. Pt ascended/descended with min A for clearance, and +2 A present for safety. Discussed best option is to ascend steps with shower chair method as pt does with flight of stairs, and pt dad bring shower chair from flight of stairs to steps and landing or pt walk with RW on grass with CGA. Pt verbalized understanding. Education provided for pt to take rest break on shower chair on bottom of shower chair steps prior to beginning walk across grass to car. Pt verbalized understanding.   Pt reports she plans to see if pt dad can come in for family training on 6/17.   Pt ascended 4 6 inch steps with B UE support on L ascending HR and shower chair technique, with mod I with improved safety with placement of shower chair, pt demos improved carry over of slowing down.   Pt stood with RW and CGA with no UE support with eyes closed 4x5 seconds and 1x13 seconds prior to requiring B UE on RW to steady onself.   Pt seated in WC at end of session with all needs within reach.    Therapy Documentation Precautions:  Precautions Precautions: Fall Precaution Comments: R BKA Required Braces or Orthoses: Other Brace Other Brace: R limb guard Restrictions Weight Bearing Restrictions: Yes RLE Weight Bearing: Non weight bearing  Therapy/Group: Individual Therapy  [facility] [place], [place], DPT  10/25/2022, 7:25 AM

## 2022-10-25 NOTE — Progress Notes (Addendum)
Occupational Therapy Session Note  Patient Details  Name: Taylor [name] MRN: [id number] Date of Birth: [birth year]  Today's Date: 10/25/2022 OT Individual Time: 0950-1050 OT Individual Time Calculation (min): 60 min    Short Term Goals: Week 1:  OT Short Term Goal 1 (Week 1): STGs=LTGs due to patient's length of stay.  Skilled Therapeutic Interventions/Progress Updates:     Patient seen this day form skilled OT.  The pt presented in good spirits this treatment session and was in agreement with completing UB exercise to improve the UB strength.  The pt was able to complete UB exercises using a 3lb dumb bell for bicep curls, shld flexion,  elbow extension, and horizontal abduction 2 sets of 10 with rest breaks as needed.  The pt required 3 rest breaks. The pt went on to complete resistive exercise using the 1lb dowel held in shld flexion with external force exerted on the dowel while maintaining shld flexion 4x for a count of 10.  The pt required 1 rest break with each exercise.  The pt was then instructed to come from sit to stand using the RW, with her gait belt in place her static standing balance was slightly challenged as she adjusted her posture by self adjusting to maintain good static / dynamic standing balance to improve her safety for standing task.  The pt remained at w/c LOF at the end of the session with her call light and bedside table within reach.  The pt was instructed in relaxation breathing mental imaging  to address her anxiety and to aid in the heal process.  Therapy Documentation Precautions:  Precautions Precautions: Fall Precaution Comments: R BKA Required Braces or Orthoses: Other Brace Other Brace: R limb guard Restrictions Weight Bearing Restrictions: Yes RLE Weight Bearing: Non weight bearing   Therapy/Group: Individual Therapy  Lavona Mound 10/25/2022, 12:20 PM

## 2022-10-25 NOTE — Progress Notes (Incomplete)
Physical Therapy Discharge Summary  Patient Details  Name: Taylor [name] MRN: [id number] Date of Birth: [birth year]  Date of Discharge from PT service:October 25, 2022  {CHL IP REHAB PT TIME CALCULATION:304800500}   Patient has met {NUMBERS 0-12:18577} of {NUMBERS 0-12:18577} long term goals due to {due WU:9811914}.  Patient to discharge at [facility] level {LOA:3049010}.   Patient's care partner {care partner:[id number]} to provide the necessary {assistance:3041652} assistance at discharge.  Reasons goals not met: ***  Recommendation:  Patient will benefit from ongoing skilled PT services in home health setting to continue to advance safe functional mobility, address ongoing impairments in strength, balance, pain management, gait, and minimize fall risk.  Equipment: No equipment provided as pt has all equipment needed from previous CIR stay.   Reasons for discharge: treatment goals met and discharge from hospital  Patient/family agrees with progress made and goals achieved: Yes  PT Discharge Precautions/Restrictions Precautions Precautions: Fall Precaution Comments: R BKA Required Braces or Orthoses: Other Brace Other Brace: R limb guard Restrictions Weight Bearing Restrictions: Yes RLE Weight Bearing: Non weight bearing  Pain Interference Pain Interference Pain Effect on Sleep: 4. Almost constantly Pain Interference with Therapy Activities: 2. Occasionally ("I do all of the therapy, but sometimes we modify to adjust to the pain") Pain Interference with Day-to-Day Activities: 2. Occasionally Vision/Perception  Vision - History Ability to See in Adequate Light: 0 Adequate Perception Perception: Within Functional Limits Praxis Praxis: Intact  Cognition Overall Cognitive Status: Within Functional Limits for tasks assessed Arousal/Alertness: Awake/alert Orientation Level: Oriented X4 Memory: Appears intact Awareness: Appears intact Problem Solving: Appears  intact Safety/Judgment: Appears intact Sensation   Motor  Motor Motor: Within Functional Limits  Mobility   Locomotion     Trunk/Postural Assessment     Balance   Extremity Assessment            Ambrose Finland 10/25/2022, 8:15 AM

## 2022-10-26 DIAGNOSIS — T8130XA Disruption of wound, unspecified, initial encounter: Secondary | ICD-10-CM | POA: Diagnosis not present

## 2022-10-26 DIAGNOSIS — R5381 Other malaise: Secondary | ICD-10-CM | POA: Diagnosis not present

## 2022-10-26 DIAGNOSIS — G8918 Other acute postprocedural pain: Secondary | ICD-10-CM

## 2022-10-26 DIAGNOSIS — T8743 Infection of amputation stump, right lower extremity: Secondary | ICD-10-CM | POA: Diagnosis not present

## 2022-10-26 DIAGNOSIS — I10 Essential (primary) hypertension: Secondary | ICD-10-CM | POA: Diagnosis not present

## 2022-10-26 LAB — BASIC METABOLIC PANEL
Anion gap: 10 (ref 5–15)
BUN: 20 mg/dL (ref 6–20)
CO2: 25 mmol/L (ref 22–32)
Calcium: 9.2 mg/dL (ref 8.9–10.3)
Chloride: 100 mmol/L (ref 98–111)
Creatinine, Ser: 0.71 mg/dL (ref 0.44–1.00)
GFR, Estimated: >60 mL/min (ref >60)
Glucose, Bld: 99 mg/dL (ref 70–99)
Potassium: 4.1 mmol/L (ref 3.5–5.1)
Sodium: 135 mmol/L (ref 135–145)

## 2022-10-26 LAB — CBC
HCT: 29.4 % — ABNORMAL LOW (ref 36.0–46.0)
Hemoglobin: 9.7 g/dL — ABNORMAL LOW (ref 12.0–15.0)
MCH: 30.2 pg (ref 26.0–34.0)
MCHC: 33 g/dL (ref 30.0–36.0)
MCV: 91.6 fL (ref 80.0–100.0)
Platelets: 356 10*3/uL (ref 150–400)
RBC: 3.21 MIL/uL — ABNORMAL LOW (ref 3.87–5.11)
RDW: 13.2 % (ref 11.5–15.5)
WBC: 6.7 10*3/uL (ref 4.0–10.5)
nRBC: 0 % (ref 0.0–0.2)

## 2022-10-26 NOTE — Progress Notes (Signed)
PROGRESS NOTE   Subjective/Complaints: Pain is improving. Sometimes slippage of limb guard can irritate leg. Otherwise doing well and feels prepared to get home!     Review of Systems  Constitutional:  Negative for fever.  HENT:  Negative for congestion.   Eyes:  Negative for double vision.  Respiratory:  Negative for shortness of breath.   Cardiovascular:  Negative for chest pain and palpitations.  Gastrointestinal:  Negative for abdominal pain, constipation, diarrhea, nausea and vomiting.  Genitourinary: Negative.   Musculoskeletal:  Positive for joint pain.  Skin:  Negative for rash.  Neurological:  Positive for tingling and sensory change. Negative for focal weakness.  Psychiatric/Behavioral:  The patient has insomnia.     Objective:   No results found. Recent Labs    10/26/22 0605  WBC 6.7  HGB 9.7*  HCT 29.4*  PLT 356    Recent Labs    10/26/22 0605  NA 135  K 4.1  CL 100  CO2 25  GLUCOSE 99  BUN 20  CREATININE 0.71  CALCIUM 9.2     Intake/Output Summary (Last 24 hours) at 10/26/2022 1214 Last data filed at 10/26/2022 0725 Gross per 24 hour  Intake 708 ml  Output --  Net 708 ml         Physical Exam: Vital Signs Blood pressure 100/68, pulse 83, temperature 97.9 F (36.6 C), temperature source Oral, resp. rate 18, height 5\' 4"  (1.626 m), weight 116 kg, SpO2 100 %.  Constitutional: No distress . Vital signs reviewed. HEENT: NCAT, EOMI, oral membranes moist Neck: supple Cardiovascular: RRR without murmur. No JVD    Respiratory/Chest: CTA Bilaterally without wheezes or rales. Normal effort    GI/Abdomen: BS +, non-tender, non-distended Ext: no clubbing, cyanosis, or edema Psych: pleasant and cooperative  Skin: warm and dry, Right stump as below, +serous drainage Please see image Neuro: Alert and oriented x 3, follows commands, cranial nerves II through XII grossly intact, normal speech  and language Strength 5/5 in b/l UE and LLE Sensation intact light touch in all 4 extremities Tinels positive b/l wrist  Musculoskeletal: no abnormal tone noted, no joint swelling noted  Blood pressure 100/68, pulse 83, temperature 97.9 F (36.6 C), temperature source Oral, resp. rate 18, height 5\' 4"  (1.626 m), weight 116 kg, SpO2 100 %.  Medical Problem List and Plan: 1. 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 Eual Fines due to cellulitis and wound dehiscence due to multiple falls             -patient may not shower             -ELOS/Goals: 6/18             -Continue CIR  -finalize dc planning. Would err on the side of a little looser shrinker right now given appearance of wound.  2.  Antithrombotics: -DVT/anticoagulation:  Pharmaceutical: Lovenox             -antiplatelet therapy: none  -  vas U/s for  LLE swelling - negative   3. Pain Management: Tylenol, oxycodone, Robaxin as needed             -  continue Neurontin 600 mg TID             -continue Flexeril 5 mg TID  -6/14 increase gabapentin to 800mg  TID for phantom pain  6/15 she reports phantom pain is controlled, denies side effects with gabapentin.  She continues to have incisional pain controlled with current regimen of oxycodone and muscle relaxers  6/17 pain improved with increase gabapentin dose. She says that pain is mostly just incisional/stump discomfort from surgery   4. Mood/Behavior/Sleep: LCSW to evaluate and provide emotional support             -antipsychotic agents: continue Vraylar 1.5 mg daily             -continue Wellbutrin, Cymbalta   5. Neuropsych/cognition: This patient is capable of making decisions on her own behalf.   6. Skin/Wound Care: Routine skin care checks   7. Fluids/Electrolytes/Nutrition: Routine Is and Os and follow-up chemistries             -continue vitamin c   6/17-I personally reviewed the patient's labs today.    8: Hypertension: Controlled, monitor TID and prn             -continue lisinopril 20 mg daily  -6/14 controlled but occasional soft BP, consider decrease lisinopril dose if continues  -6/15 will decrease lisinopril dose to 10mg  daily  -6/17 blood pressure remains stable. No changes.  Patient plans to schedule follow-up with PCP shortly after discharge     10/26/2022    2:44 AM 10/25/2022    7:24 PM 10/25/2022    2:38 PM  Vitals with BMI  Systolic 100 128 98  Diastolic 68 88 55  Pulse 83 98 88      9: Bipolar disorder/PTSD/anxiety/depression: see #4  -She feels like her mood is getting better gradually, she feels like it is better this admission the last admission.   10: Morbid obesity: BMI 45.49             -Dietary counseling   11: Right BKA site infection s/p revision Dr. Lajoyce Corners 6/07             -continue wound Vac             -continue Duricef 500 mg BID for total of 36 doses  -6/12 Wound vac removed today  -6/13 incision without signs of infection noted, continue to monitor  -6/14 continue to encourage use of limb protector  -6/14 addendum Dr. Lajoyce Corners started doxycycline 100mg  BID and 3xl stump shrinker , appreciate   -6/17 incision stable   12: ABLA: Last HBG 8.4 on 10/20/22.  follow-up CBC  -6/17 hgb 9.7   13.  Suspected carpal tunnel syndrome bilateral upper extremities             -Advise continuing use of wrist braces at night, may need further evaluation on outpatient basis   14. Insomnia  -Schedule trazodone  -sleeping better with medications    LOS: 6 days A FACE TO FACE EVALUATION WAS PERFORMED  Ranelle Oyster 10/26/2022, 12:14 PM

## 2022-10-26 NOTE — Progress Notes (Signed)
Inpatient Rehabilitation Care Coordinator Discharge Note   Patient Details  Name: Taylor [name] MRN: [id number] Date of Birth: [birth year]   Discharge location: HOME WITH DAD WHO CAN BE THERE WITH HER. ALONG WITH HER 34 YO DAUGHTER  Length of Stay: 7 DAYS  Discharge activity level: MOD/I WHEELCHAIR LEVEL  Home/community participation: ACTIVE  Patient response WU:JWJXBJ Literacy - How often do you need to have someone help you when you read instructions, pamphlets, or other written material from your doctor or pharmacy?: Never  Patient response YN:WGNFAO Isolation - How often do you feel lonely or isolated from those around you?: Never  Services provided included: MD, RD, PT, OT, RN, CM, Pharmacy, SW  Financial Services:  Financial Services Utilized: Medicare    Choices offered to/list presented to: PT  Follow-up services arranged:  Home Health, Patient/Family has no preference for HH/DME agencies Home Health Agency: [facility]  PT  OT  RN    DME : HAS DME FROM PAST ADMISSION    Patient response to transportation need: Is the patient able to respond to transportation needs?: Yes In the past 12 months, has lack of transportation kept you from medical appointments or from getting medications?: No In the past 12 months, has lack of transportation kept you from meetings, work, or from getting things needed for daily living?: No   Patient/Family verbalized understanding of follow-up arrangements:  Yes  Individual responsible for coordination of the follow-up plan: SELF [phone]  Confirmed correct DME delivered: Lucy Chris 10/26/2022    Comments (or additional information):PT DID WELL HAD RECENTLY BEEN HERE AND REACHED HER MOD/I LEVEL GOALS. HOPEFULLY SHE WILL BE SAFER THIS TIME GOING HOME.   Summary of Stay    Date/Time Discharge Planning CSW  10/21/22 1034 New evaluation was just here 09/2022 and discharged home with Dad and 69 yo daughter. Will  confirm plans and see if [facility] still seeing RGD       Alphonso Gregson, Lemar Livings

## 2022-10-26 NOTE — Progress Notes (Signed)
Inpatient Rehabilitation Discharge Medication Review by a Pharmacist  A complete drug regimen review was completed for this patient to identify any potential clinically significant medication issues.  High Risk Drug Classes Is patient taking? Indication by Medication  Antipsychotic Yes Vraylar- BP  Anticoagulant No   Antibiotic Yes Duricef, doxycycline- cellulitis  Opioid Yes OxyIR- acute pain  Antiplatelet No   Hypoglycemics/insulin No   Vasoactive Medication Yes Lisinopril- HTN  Chemotherapy No   Other Yes Wellbutrin- MDD Flexeril- muscle spasms Cymbalta- MDD/anxiety Gabapentin- neuropathic pain Robaxin- muscle spasms Protonix- GERD Trazodone- sleep     Type of Medication Issue Identified Description of Issue Recommendation(s)  Drug Interaction(s) (clinically significant)     Duplicate Therapy     Allergy     No Medication Administration End Date     Incorrect Dose     Additional Drug Therapy Needed     Significant med changes from prior encounter (inform family/care partners about these prior to discharge).    Other       Clinically significant medication issues were identified that warrant physician communication and completion of prescribed/recommended actions by midnight of the next day:  No   Time spent performing this drug regimen review (minutes):  30   Nekeya Briski BS, PharmD, BCPS Clinical Pharmacist 10/27/2022 11:23 AM  Contact: [phone] after 3 PM  "Be curious, not judgmental..." -Debbora Dus

## 2022-10-26 NOTE — Progress Notes (Signed)
Patient ID: Taylor [name], female   DOB: 1990/05/25, 32 y.o.   MRN: [id number] Provided supplies for BID dressing to incision area x week for discharge tomorrow with follow up appointment with orthopedic surgeon in 1 week. Pamelia Hoit

## 2022-10-26 NOTE — Plan of Care (Signed)
  Problem: RH Balance Goal: LTG Patient will maintain dynamic standing balance (PT) Description: LTG:  Patient will maintain dynamic standing balance with assistance during mobility activities (PT) Outcome: Completed/Met   Problem: Sit to Stand Goal: LTG:  Patient will perform sit to stand with assistance level (PT) Description: LTG:  Patient will perform sit to stand with assistance level (PT) Outcome: Completed/Met   Problem: RH Bed to Chair Transfers Goal: LTG Patient will perform bed/chair transfers w/assist (PT) Description: LTG: Patient will perform bed to chair transfers with assistance (PT). Outcome: Completed/Met   Problem: RH Car Transfers Goal: LTG Patient will perform car transfers with assist (PT) Description: LTG: Patient will perform car transfers with assistance (PT). Outcome: Completed/Met   Problem: RH Stairs Goal: LTG Patient will ambulate up and down stairs w/assist (PT) Description: LTG: Patient will ambulate up and down # of stairs with assistance (PT) Outcome: Completed/Met   Problem: RH Ambulation Goal: LTG Patient will ambulate in controlled environment (PT) Description: LTG: Patient will ambulate in a controlled environment, # of feet with assistance (PT). Outcome: Completed/Met Goal: LTG Patient will ambulate in home environment (PT) Description: LTG: Patient will ambulate in home environment, # of feet with assistance (PT). Outcome: Completed/Met

## 2022-10-26 NOTE — Plan of Care (Signed)
  Problem: RH Bathing Goal: LTG Patient will bathe all body parts with assist levels (OT) Description: LTG: Patient will bathe all body parts with assist levels (OT) Outcome: Completed/Met   Problem: RH Dressing Goal: LTG Patient will perform lower body dressing w/assist (OT) Description: LTG: Patient will perform lower body dressing with assist, with/without cues in positioning using equipment (OT) Outcome: Completed/Met   Problem: RH Toileting Goal: LTG Patient will perform toileting task (3/3 steps) with assistance level (OT) Description: LTG: Patient will perform toileting task (3/3 steps) with assistance level (OT)  Outcome: Completed/Met   Problem: RH Simple Meal Prep Goal: LTG Patient will perform simple meal prep w/assist (OT) Description: LTG: Patient will perform simple meal prep with assistance, with/without cues (OT). Outcome: Completed/Met   Problem: RH Light Housekeeping Goal: LTG Patient will perform light housekeeping w/assist (OT) Description: LTG: Patient will perform light housekeeping with assistance, with/without cues (OT). Outcome: Completed/Met   Problem: RH Toilet Transfers Goal: LTG Patient will perform toilet transfers w/assist (OT) Description: LTG: Patient will perform toilet transfers with assist, with/without cues using equipment (OT) Outcome: Completed/Met   Problem: RH Tub/Shower Transfers Goal: LTG Patient will perform tub/shower transfers w/assist (OT) Description: LTG: Patient will perform tub/shower transfers with assist, with/without cues using equipment (OT) Outcome: Completed/Met   

## 2022-10-27 ENCOUNTER — Ambulatory Visit (INDEPENDENT_AMBULATORY_CARE_PROVIDER_SITE_OTHER): Payer: Medicare Other

## 2022-10-27 ENCOUNTER — Other Ambulatory Visit (HOSPITAL_COMMUNITY): Payer: Self-pay

## 2022-10-27 ENCOUNTER — Inpatient Hospital Stay: Payer: Medicare Other | Admitting: Family

## 2022-10-27 DIAGNOSIS — M479 Spondylosis, unspecified: Secondary | ICD-10-CM

## 2022-10-27 DIAGNOSIS — J45909 Unspecified asthma, uncomplicated: Secondary | ICD-10-CM | POA: Diagnosis not present

## 2022-10-27 DIAGNOSIS — E876 Hypokalemia: Secondary | ICD-10-CM | POA: Diagnosis not present

## 2022-10-27 DIAGNOSIS — M5416 Radiculopathy, lumbar region: Secondary | ICD-10-CM

## 2022-10-27 DIAGNOSIS — M79604 Pain in right leg: Secondary | ICD-10-CM

## 2022-10-27 DIAGNOSIS — Z4781 Encounter for orthopedic aftercare following surgical amputation: Secondary | ICD-10-CM | POA: Diagnosis not present

## 2022-10-27 DIAGNOSIS — D649 Anemia, unspecified: Secondary | ICD-10-CM

## 2022-10-27 DIAGNOSIS — F319 Bipolar disorder, unspecified: Secondary | ICD-10-CM

## 2022-10-27 DIAGNOSIS — G8929 Other chronic pain: Secondary | ICD-10-CM

## 2022-10-27 DIAGNOSIS — M419 Scoliosis, unspecified: Secondary | ICD-10-CM

## 2022-10-27 DIAGNOSIS — G43909 Migraine, unspecified, not intractable, without status migrainosus: Secondary | ICD-10-CM

## 2022-10-27 DIAGNOSIS — I10 Essential (primary) hypertension: Secondary | ICD-10-CM

## 2022-10-27 DIAGNOSIS — F411 Generalized anxiety disorder: Secondary | ICD-10-CM

## 2022-10-27 MED ORDER — ACETAMINOPHEN 325 MG PO TABS: 325.0000 mg | ORAL_TABLET | ORAL | Status: DC | PRN

## 2022-10-27 MED ORDER — CEFADROXIL 500 MG PO CAPS
500.0000 mg | ORAL_CAPSULE | Freq: Two times a day (BID) | ORAL | 0 refills | Status: AC
  Filled 2022-10-27: qty 22, 11d supply, fill #0

## 2022-10-27 MED ORDER — OXYCODONE HCL 10 MG PO TABS
10.0000 mg | ORAL_TABLET | ORAL | 0 refills | Status: DC | PRN
  Filled 2022-10-27: qty 28, 3d supply, fill #0

## 2022-10-27 MED ORDER — DULOXETINE HCL 60 MG PO CPEP
120.0000 mg | ORAL_CAPSULE | Freq: Every day | ORAL | 0 refills | Status: DC
  Filled 2022-10-27: qty 60, 30d supply, fill #0

## 2022-10-27 MED ORDER — POLYETHYLENE GLYCOL 3350 17 G PO PACK: 17.0000 g | PACK | Freq: Every day | ORAL | 0 refills | Status: DC | PRN

## 2022-10-27 MED ORDER — CYCLOBENZAPRINE HCL 5 MG PO TABS
5.0000 mg | ORAL_TABLET | Freq: Three times a day (TID) | ORAL | 0 refills | Status: DC
  Filled 2022-10-27: qty 90, 30d supply, fill #0

## 2022-10-27 MED ORDER — BUPROPION HCL ER (XL) 300 MG PO TB24
300.0000 mg | ORAL_TABLET | Freq: Every morning | ORAL | 0 refills | Status: DC
  Filled 2022-10-27: qty 30, 30d supply, fill #0

## 2022-10-27 MED ORDER — GABAPENTIN 400 MG PO CAPS
800.0000 mg | ORAL_CAPSULE | Freq: Three times a day (TID) | ORAL | 0 refills | Status: DC
  Filled 2022-10-27: qty 180, 30d supply, fill #0

## 2022-10-27 MED ORDER — ASCORBIC ACID 1000 MG PO TABS: 1000.0000 mg | ORAL_TABLET | Freq: Every day | ORAL | 0 refills | Status: DC

## 2022-10-27 MED ORDER — LISINOPRIL 10 MG PO TABS
10.0000 mg | ORAL_TABLET | Freq: Every day | ORAL | 0 refills | Status: DC
  Filled 2022-10-27: qty 30, 30d supply, fill #0

## 2022-10-27 MED ORDER — DOXYCYCLINE HYCLATE 100 MG PO TABS
100.0000 mg | ORAL_TABLET | Freq: Two times a day (BID) | ORAL | 0 refills | Status: DC
  Filled 2022-10-27: qty 30, 15d supply, fill #0

## 2022-10-27 MED ORDER — DOCUSATE SODIUM 100 MG PO CAPS: 100.0000 mg | ORAL_CAPSULE | Freq: Two times a day (BID) | ORAL | 0 refills | Status: DC

## 2022-10-27 MED ORDER — TRAZODONE HCL 50 MG PO TABS
50.0000 mg | ORAL_TABLET | Freq: Every day | ORAL | 0 refills | Status: DC
  Filled 2022-10-27: qty 30, 30d supply, fill #0

## 2022-10-27 MED ORDER — METHOCARBAMOL 500 MG PO TABS
500.0000 mg | ORAL_TABLET | Freq: Four times a day (QID) | ORAL | 0 refills | Status: DC | PRN
  Filled 2022-10-27: qty 60, 15d supply, fill #0

## 2022-10-27 NOTE — Progress Notes (Signed)
PROGRESS NOTE   Subjective/Complaints: Pain is is controlled with current dose of oxycodone and gabapentin..  She is looking forward to going home.  Review of Systems  Constitutional:  Negative for fever.  HENT:  Negative for congestion.   Eyes:  Negative for double vision.  Respiratory:  Negative for shortness of breath.   Cardiovascular:  Positive for leg swelling. Negative for chest pain and palpitations.  Gastrointestinal:  Negative for abdominal pain, constipation, diarrhea, nausea and vomiting.  Genitourinary: Negative.   Musculoskeletal:  Positive for joint pain.  Skin:  Negative for rash.  Neurological:  Positive for tingling and sensory change. Negative for focal weakness and headaches.  Psychiatric/Behavioral:  The patient has insomnia.     Objective:   No results found. Recent Labs    10/26/22 0605  WBC 6.7  HGB 9.7*  HCT 29.4*  PLT 356    Recent Labs    10/26/22 0605  NA 135  K 4.1  CL 100  CO2 25  GLUCOSE 99  BUN 20  CREATININE 0.71  CALCIUM 9.2     Intake/Output Summary (Last 24 hours) at 10/27/2022 1058 Last data filed at 10/27/2022 0740 Gross per 24 hour  Intake 893 ml  Output --  Net 893 ml         Physical Exam: Vital Signs Blood pressure 106/65, pulse 83, temperature 98.1 F (36.7 C), temperature source Oral, resp. rate 18, height 5\' 4"  (1.626 m), weight 116 kg, SpO2 99 %.  Constitutional: No distress . Vital signs reviewed. HEENT: NCAT, EOMI, oral membranes moist Neck: supple Cardiovascular: RRR without murmur. No JVD    Respiratory/Chest: CTA Bilaterally without wheezes or rales. Normal effort    GI/Abdomen: BS +, non-tender, non-distended Ext: no clubbing, cyanosis, or edema Psych: pleasant and cooperative  Skin: warm and dry, Right stump as below, +serous drainage, incision looks a little improved compared to the weekend She is wearing her shrinker Neuro: Alert and  oriented x 3, follows commands, cranial nerves II through XII grossly intact, normal speech and language Strength 5/5 in b/l UE and LLE Sensation intact light touch in all 4 extremities Tinels positive b/l wrist  Musculoskeletal: no abnormal tone noted, no joint swelling noted  Blood pressure 106/65, pulse 83, temperature 98.1 F (36.7 C), temperature source Oral, resp. rate 18, height 5\' 4"  (1.626 m), weight 116 kg, SpO2 99 %.  Medical Problem List and Plan: 1. 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 Eual Fines due to cellulitis and wound dehiscence due to multiple falls             -patient may not shower             -ELOS/Goals: 6/18             -Continue CIR  -finalize dc planning. Would err on the side of a little looser shrinker right now given appearance of wound.   -DC today, discussed plan for outpatient follow-up in my clinic 2.  Antithrombotics: -DVT/anticoagulation:  Pharmaceutical: Lovenox             -antiplatelet therapy:  none  -  vas U/s for  LLE swelling - negative   3. Pain Management: Tylenol, oxycodone, Robaxin as needed             -continue Neurontin 600 mg TID             -continue Flexeril 5 mg TID  -6/14 increase gabapentin to 800mg  TID for phantom pain  6/15 she reports phantom pain is controlled, denies side effects with gabapentin.  She continues to have incisional pain controlled with current regimen of oxycodone and muscle relaxers  6/17 pain improved with increase gabapentin dose. She says that pain is mostly just incisional/stump discomfort from surgery  6/18 we will plan to wean down oxycodone on outpatient basis   4. Mood/Behavior/Sleep: LCSW to evaluate and provide emotional support             -antipsychotic agents: continue Vraylar 1.5 mg daily             -continue Wellbutrin, Cymbalta   5. Neuropsych/cognition: This patient is capable of making decisions on her own  behalf.   6. Skin/Wound Care: Routine skin care checks   7. Fluids/Electrolytes/Nutrition: Routine Is and Os and follow-up chemistries             -continue vitamin c   6/17-I personally reviewed the patient's labs today.   8: Hypertension: Controlled, monitor TID and prn             -continue lisinopril 20 mg daily  -6/14 controlled but occasional soft BP, consider decrease lisinopril dose if continues  -6/15 will decrease lisinopril dose to 10mg  daily  -6/17 blood pressure remains stable. No changes.  Patient plans to schedule follow-up with PCP shortly after discharge  6/18 well-controlled, continue current regimen of lisinopril     10/27/2022    4:55 AM 10/26/2022    7:50 PM 10/26/2022    1:42 PM  Vitals with BMI  Systolic 106 103 914  Diastolic 65 82 63  Pulse 83 87 91      9: Bipolar disorder/PTSD/anxiety/depression: see #4  -She feels like her mood is getting better gradually, she feels like it is better this admission the last admission.   10: Morbid obesity: BMI 45.49             -Dietary counseling   11: Right BKA site infection s/p revision Dr. Lajoyce Corners 6/07             -continue wound Vac             -continue Duricef 500 mg BID for total of 36 doses  -6/12 Wound vac removed today  -6/13 incision without signs of infection noted, continue to monitor  -6/14 continue to encourage use of limb protector  -6/14 addendum Dr. Lajoyce Corners started doxycycline 100mg  BID and 3xl stump shrinker , appreciate   -6/17 incision stable  -6/18 incision looks a little better today, follow-up with orthopedics outpatient   12: ABLA: Last HBG 8.4 on 10/20/22.  follow-up CBC  -6/17 hgb 9.7   13.  Suspected carpal tunnel syndrome bilateral upper extremities             -Advise continuing use of wrist braces at night, may need further evaluation on outpatient basis  -Could consider EMG on outpatient basis   14. Insomnia  -Schedule trazodone  -sleeping better with medications    LOS: 7  days A FACE TO FACE EVALUATION WAS PERFORMED  Fanny Dance 10/27/2022, 10:58 AM

## 2022-10-27 NOTE — Progress Notes (Signed)
Per [employer] Rep. Patient received shrinkers yesterday.     Tilden Dome, LPN

## 2022-10-27 NOTE — Progress Notes (Signed)
Discharge instruction provided by Wendi Maya, PA. Patient received medications from TOC. Staff assisted patient off the unit; patient left via private car with family.    Tilden Dome, LPN

## 2022-10-27 NOTE — Progress Notes (Signed)
Orthopedic Tech Progress Note Patient Details:  [name] Sep 25, 1990 [id number]  Called in order to [facility] for a SMALL BKA SHRINKER   Patient ID: Taylor [name], female   DOB: January 02, 1991, 32 y.o.   MRN: [id number]  [name] 10/27/2022, 8:25 AM

## 2022-10-28 ENCOUNTER — Telehealth: Payer: Self-pay

## 2022-10-28 NOTE — Transitions of Care (Post Inpatient/ED Visit) (Signed)
10/28/2022  Name: Taylor [name] MRN: [id number] DOB: [birth year]  Today's TOC FU Call Status: Today's TOC FU Call Status:: Successful TOC FU Call Competed TOC FU Call Complete Date: 10/28/22  Transition Care Management Follow-up Telephone Call Date of Discharge: 10/27/22 Discharge Facility: [facility] Type of Discharge: Inpatient Admission Primary Inpatient Discharge Diagnosis:: surgical site infection How have you been since you were released from the hospital?: Better Any questions or concerns?: No  Items Reviewed: Did you receive and understand the discharge instructions provided?: Yes Medications obtained,verified, and reconciled?: Yes (Medications Reviewed) Any new allergies since your discharge?: No Dietary orders reviewed?: Yes People in Home: parent(s)  Medications Reviewed Today: Medications Reviewed Today     Reviewed by Karena Addison, LPN (Licensed Practical Nurse) on 10/28/22 at 1103  Med List Status: <None>   Medication Order Taking? Sig Documenting Provider Last Dose Status Informant  acetaminophen (TYLENOL) 325 MG tablet [id number]  Take 1-2 tablets (325-650 mg total) by mouth every 4 (four) hours as needed for mild pain. Setzer, Lynnell Jude, PA-C  Active   ascorbic acid (VITAMIN C) 1000 MG tablet [id number]  Take 1 tablet (1,000 mg total) by mouth daily. Setzer, Lynnell Jude, PA-C  Active   buPROPion (WELLBUTRIN XL) 300 MG 24 hr tablet [id number]  Take 1 tablet (300 mg total) by mouth every morning. Setzer, Lynnell Jude, PA-C  Active   cefadroxil (DURICEF) 500 MG capsule [id number]  Take 1 capsule (500 mg total) by mouth 2 (two) times daily for 11 days. Setzer, Lynnell Jude, PA-C  Active   cyclobenzaprine (FLEXERIL) 5 MG tablet [id number]  Take 1 tablet (5 mg total) by mouth 3 (three) times daily. Setzer, Lynnell Jude, PA-C  Active   docusate sodium (COLACE) 100 MG capsule [id number]  Take 1 capsule (100 mg total) by mouth 2 (two) times daily. Setzer, Lynnell Jude, PA-C  Active    doxycycline (VIBRA-TABS) 100 MG tablet [id number]  Take 1 tablet (100 mg total) by mouth every 12 (twelve) hours. Setzer, Lynnell Jude, PA-C  Active   doxylamine, Sleep, (UNISOM) 25 MG tablet [id number] No Take 25 mg by mouth at bedtime as needed for sleep. [provider] Past Week Active Self  DULoxetine (CYMBALTA) 60 MG capsule [id number]  Take 2 capsules (120 mg total) by mouth daily. Setzer, Lynnell Jude, PA-C  Active   gabapentin (NEURONTIN) 400 MG capsule [id number]  Take 2 capsules (800 mg total) by mouth 3 (three) times daily. Setzer, Lynnell Jude, PA-C  Active   lisinopril (ZESTRIL) 10 MG tablet [id number]  Take 1 tablet (10 mg total) by mouth daily. Setzer, Lynnell Jude, PA-C  Active   methocarbamol (ROBAXIN) 500 MG tablet [id number]  Take 1 tablet (500 mg total) by mouth every 6 (six) hours as needed for muscle spasms. Setzer, Lynnell Jude, PA-C  Active   Oxycodone HCl 10 MG TABS [id number]  Take 1-1.5 tablets (10-15 mg total) by mouth every 4 (four) hours as needed (pain score 7-10). Setzer, Lynnell Jude, PA-C  Active   pantoprazole (PROTONIX) 40 MG tablet [id number] No Take 1 tablet (40 mg total) by mouth 2 (two) times daily. [name], [place] 10/15/2022 Active Self  polyethylene glycol (MIRALAX / GLYCOLAX) 17 g packet [id number]  Take 17 g by mouth daily as needed for mild constipation. Setzer, Lynnell Jude, PA-C  Active   senna-docusate (SENOKOT-S) 8.6-50 MG tablet [id number]  Take 1 tablet by mouth at bedtime. Lonia Blood, MD  Active   traZODone (DESYREL) 50  MG tablet [id number]  Take 1 tablet (50 mg total) by mouth at bedtime. Setzer, Lynnell Jude, PA-C  Active   VRAYLAR 1.5 MG capsule [id number] No Take 1.5 mg by mouth daily. [provider] 10/15/2022 Active Self            Home Care and Equipment/Supplies: Were Home Health Services Ordered?: Yes Name of Home Health Agency:: unknown Has Agency set up a time to come to your home?: Yes First Home Health Visit Date: 10/29/22 Any new equipment  or medical supplies ordered?: Yes Name of Medical supply agency?: unknow Were you able to get the equipment/medical supplies?: Yes Do you have any questions related to the use of the equipment/supplies?: No  Functional Questionnaire: Do you need assistance with bathing/showering or dressing?: Yes Do you need assistance with meal preparation?: No Do you need assistance with eating?: No Do you have difficulty maintaining continence: No Do you need assistance with getting out of bed/getting out of a chair/moving?: Yes Do you have difficulty managing or taking your medications?: No  Follow up appointments reviewed: PCP Follow-up appointment confirmed?: Yes Date of PCP follow-up appointment?: 10/29/22 Follow-up Provider: [facility] Follow-up appointment confirmed?: Yes Date of Specialist follow-up appointment?: 11/04/22 Follow-Up Specialty Provider:: ortho surgeon Do you need transportation to your follow-up appointment?: No Do you understand care options if your condition(s) worsen?: Yes-patient verbalized understanding    SIGNATURE Karena Addison, LPN [facility] Nurse Health Advisor Direct Dial [phone]

## 2022-10-29 ENCOUNTER — Inpatient Hospital Stay: Payer: Medicare Other | Admitting: Family

## 2022-10-30 ENCOUNTER — Encounter: Payer: Medicare Other | Admitting: Physical Medicine & Rehabilitation

## 2022-11-02 ENCOUNTER — Telehealth: Payer: Self-pay | Admitting: Orthopedic Surgery

## 2022-11-02 ENCOUNTER — Other Ambulatory Visit: Payer: Self-pay | Admitting: Orthopedic Surgery

## 2022-11-02 MED ORDER — OXYCODONE HCL 10 MG PO TABS: 10.0000 mg | ORAL_TABLET | Freq: Three times a day (TID) | ORAL | 0 refills | Status: DC | PRN

## 2022-11-02 NOTE — Telephone Encounter (Signed)
Pt is s/p a right BKA revision on 10/16/2022. Requesting refill of Oxycodone 10 mg last refill was 10/27/2022 #28 please advise.

## 2022-11-02 NOTE — Telephone Encounter (Signed)
Patient called. She needs a refill on oxycodone, her cb# [phone]

## 2022-11-03 ENCOUNTER — Inpatient Hospital Stay: Payer: Medicare Other | Admitting: Family

## 2022-11-04 ENCOUNTER — Encounter: Payer: Self-pay | Admitting: Family

## 2022-11-04 ENCOUNTER — Ambulatory Visit (INDEPENDENT_AMBULATORY_CARE_PROVIDER_SITE_OTHER): Payer: Medicare Other | Admitting: Family

## 2022-11-04 ENCOUNTER — Encounter: Payer: Self-pay | Admitting: Family Medicine

## 2022-11-04 ENCOUNTER — Inpatient Hospital Stay: Payer: Medicare Other

## 2022-11-04 ENCOUNTER — Ambulatory Visit (INDEPENDENT_AMBULATORY_CARE_PROVIDER_SITE_OTHER): Payer: Medicare Other | Admitting: Family Medicine

## 2022-11-04 VITALS — BP 107/52 | HR 104 | Temp 98.1°F | Resp 20 | Ht 64.0 in | Wt 283.0 lb

## 2022-11-04 DIAGNOSIS — T8743 Infection of amputation stump, right lower extremity: Secondary | ICD-10-CM

## 2022-11-04 DIAGNOSIS — G5603 Carpal tunnel syndrome, bilateral upper limbs: Secondary | ICD-10-CM | POA: Diagnosis not present

## 2022-11-04 DIAGNOSIS — T8142XA Infection following a procedure, deep incisional surgical site, initial encounter: Secondary | ICD-10-CM

## 2022-11-04 DIAGNOSIS — D62 Acute posthemorrhagic anemia: Secondary | ICD-10-CM | POA: Diagnosis not present

## 2022-11-04 DIAGNOSIS — Z89511 Acquired absence of right leg below knee: Secondary | ICD-10-CM

## 2022-11-04 DIAGNOSIS — L301 Dyshidrosis [pompholyx]: Secondary | ICD-10-CM

## 2022-11-04 DIAGNOSIS — Z09 Encounter for follow-up examination after completed treatment for conditions other than malignant neoplasm: Secondary | ICD-10-CM

## 2022-11-04 LAB — ANEMIA PROFILE B
Basophils Absolute: 0 10*3/uL (ref 0.0–0.2)
Hemoglobin: 8.6 g/dL — CL (ref 11.1–15.9)

## 2022-11-04 LAB — BMP8+EGFR

## 2022-11-04 MED ORDER — TRIAMCINOLONE ACETONIDE 0.5 % EX OINT
1.0000 | TOPICAL_OINTMENT | Freq: Two times a day (BID) | CUTANEOUS | 0 refills | Status: DC
Start: 2022-11-04 — End: 2023-02-04

## 2022-11-04 MED ORDER — OXYCODONE HCL 10 MG PO TABS: 10.0000 mg | ORAL_TABLET | Freq: Four times a day (QID) | ORAL | 0 refills | Status: DC | PRN
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
## 2022-11-05 LAB — BMP8+EGFR
BUN/Creatinine Ratio: 17 (ref 9–23)
CO2: 23 mmol/L (ref 20–29)
Calcium: 9.4 mg/dL (ref 8.7–10.2)
Chloride: 99 mmol/L (ref 96–106)
Glucose: 87 mg/dL (ref 70–99)
Potassium: 4.4 mmol/L (ref 3.5–5.2)
Sodium: 137 mmol/L (ref 134–144)
eGFR: 125 mL/min/{1.73_m2} (ref >59)

## 2022-11-05 LAB — ANEMIA PROFILE B
Basos: 0 %
EOS (ABSOLUTE): 0.5 10*3/uL — ABNORMAL HIGH (ref 0.0–0.4)
Eos: 7 %
Ferritin: 23 ng/mL (ref 15–150)
Folate: >20 ng/mL (ref >3.0)
Hematocrit: 26.2 % — ABNORMAL LOW (ref 34.0–46.6)
Immature Grans (Abs): 0 10*3/uL (ref 0.0–0.1)
Immature Granulocytes: 0 %
Iron Saturation: 9 % — CL (ref 15–55)
Iron: 28 ug/dL (ref 27–159)
Lymphocytes Absolute: 1.9 10*3/uL (ref 0.7–3.1)
Lymphs: 24 %
MCH: 29.9 pg (ref 26.6–33.0)
MCHC: 32.8 g/dL (ref 31.5–35.7)
MCV: 91 fL (ref 79–97)
Monocytes Absolute: 0.6 10*3/uL (ref 0.1–0.9)
Monocytes: 8 %
Neutrophils Absolute: 4.7 10*3/uL (ref 1.4–7.0)
Neutrophils: 61 %
Platelets: 286 10*3/uL (ref 150–450)
RBC: 2.88 x10E6/uL — ABNORMAL LOW (ref 3.77–5.28)
RDW: 13 % (ref 11.7–15.4)
Retic Ct Pct: 1.7 % (ref 0.6–2.6)
Total Iron Binding Capacity: 309 ug/dL (ref 250–450)
Vitamin B-12: 433 pg/mL (ref 232–1245)
WBC: 7.7 10*3/uL (ref 3.4–10.8)

## 2022-11-10 ENCOUNTER — Encounter: Payer: Self-pay | Admitting: Family Medicine

## 2022-11-13 ENCOUNTER — Encounter: Payer: Self-pay | Admitting: Family

## 2022-11-13 ENCOUNTER — Ambulatory Visit (INDEPENDENT_AMBULATORY_CARE_PROVIDER_SITE_OTHER): Payer: Medicare PPO | Admitting: Family

## 2022-11-13 ENCOUNTER — Telehealth: Payer: Self-pay | Admitting: Family

## 2022-11-13 VITALS — BP 95/65 | HR 93 | Temp 97.8°F | Ht 64.0 in

## 2022-11-13 DIAGNOSIS — D509 Iron deficiency anemia, unspecified: Secondary | ICD-10-CM

## 2022-11-13 DIAGNOSIS — K219 Gastro-esophageal reflux disease without esophagitis: Secondary | ICD-10-CM | POA: Diagnosis not present

## 2022-11-13 DIAGNOSIS — F411 Generalized anxiety disorder: Secondary | ICD-10-CM | POA: Diagnosis not present

## 2022-11-13 DIAGNOSIS — F39 Unspecified mood [affective] disorder: Secondary | ICD-10-CM

## 2022-11-13 DIAGNOSIS — F17200 Nicotine dependence, unspecified, uncomplicated: Secondary | ICD-10-CM | POA: Diagnosis not present

## 2022-11-13 DIAGNOSIS — F32A Depression, unspecified: Secondary | ICD-10-CM

## 2022-11-13 DIAGNOSIS — I159 Secondary hypertension, unspecified: Secondary | ICD-10-CM | POA: Diagnosis not present

## 2022-11-13 DIAGNOSIS — Z89511 Acquired absence of right leg below knee: Secondary | ICD-10-CM

## 2022-11-13 MED ORDER — OXYCODONE HCL 5 MG PO CAPS: 5.0000 mg | ORAL_CAPSULE | ORAL | 0 refills | Status: DC | PRN

## 2022-11-13 NOTE — Telephone Encounter (Signed)
Pt called requesting a refill of oxycodone. Please send to pharmacy on file. Pt phone number is [phone].

## 2022-11-13 NOTE — Patient Instructions (Signed)
Iron Deficiency Anemia, Adult  Iron deficiency anemia is a condition in which the concentration of red blood cells or hemoglobin in the blood is below normal because of too little iron. Hemoglobin is a substance in red blood cells that carries oxygen to the body's tissues. When the concentration of red blood cells or hemoglobin is too low, not enough oxygen reaches these tissues. Iron deficiency anemia is usually long-lasting, and it develops over time. It may or may not cause symptoms. It is a common type of anemia. What are the causes? This condition may be caused by: Not enough iron in the diet. Abnormal absorption in the gut. Blood loss. What increases the risk? You are more likely to develop this condition if you get menstrual periods (menstruate) or are pregnant. What are the signs or symptoms? Symptoms of this condition may include: Pale skin, lips, and nail beds. Weakness, dizziness, and getting tired easily. Shortness of breath when moving or exercising. Cold hands or feet. Mild anemia may not cause any symptoms. How is this diagnosed? This condition is diagnosed based on: Your medical history. A physical exam. Blood tests. How is this treated? This condition is treated by correcting the cause of your iron deficiency. Treatment may involve: Adding iron-rich foods to your diet. Taking iron supplements. If you are pregnant or breastfeeding, you may need to take extra iron because your normal diet usually does not provide the amount of iron that you need. Increasing vitamin C intake. Vitamin C helps your body absorb iron. Your health care provider may recommend that you take iron supplements along with a glass of orange juice or a vitamin C supplement. Medicines to make heavy menstrual flow lighter. Surgery or additional testing procedures to determine the cause of your anemia. You may need repeat blood tests to determine whether treatment is working. If the treatment does not  seem to be working, you may need more tests. Follow these instructions at home: Medicines Take over-the-counter and prescription medicines only as told by your health care provider. This includes iron supplements and vitamins. This is important because too much iron can be harmful. For the best iron absorption, you should take iron supplements when your stomach is empty. If you cannot tolerate them on an empty stomach, you may need to take them with food. Do not drink milk or take antacids at the same time as your iron supplements. Milk and antacids may interfere with how your body absorbs iron. Iron supplements may turn stool (feces) a darker color and it may appear black. If you cannot tolerate taking iron supplements by mouth, talk with your health care provider about taking them through an IV or through an injection into a muscle. Eating and drinking Talk with your health care provider before changing your diet. Your provider may recommend that you eat foods that contain a lot of iron, such as: Liver. Low-fat (lean) beef. Breads and cereals that have iron added to them (are fortified). Eggs. Dried fruit. Dark green, leafy vegetables. To help your body use the iron from iron-rich foods, eat those foods at the same time as fresh fruits and vegetables that are high in vitamin C. Foods that are high in vitamin C include: Oranges. Peppers. Tomatoes. Mangoes. Managing constipation If you are taking an iron supplement, it may cause constipation. To prevent or treat constipation, you may need to: Drink enough fluid to keep your urine pale yellow. Take over-the-counter or prescription medicines. Eat foods that are high in fiber, such   as beans, whole grains, and fresh fruits and vegetables. Limit foods that are high in fat and processed sugars, such as fried or sweet foods. General instructions Return to your normal activities as told by your health care provider. Ask your health care provider  what activities are safe for you. Keep all follow-up visits. Contact a health care provider if: You feel nauseous or you vomit. You feel weak. You become light-headed when getting up from a sitting or lying down position. You have unexplained sweating. You develop symptoms of constipation. You have a heaviness in your chest. You have trouble breathing with physical activity. Get help right away if: You faint. If this happens, do not drive yourself to the hospital. You have an irregular or rapid heartbeat. Summary Iron deficiency anemia is a condition in which the concentration of red blood cells or hemoglobin in the blood is below normal because of too little iron. This condition is treated by correcting the cause of your iron deficiency. Take over-the-counter and prescription medicines only as told by your health care provider. This includes iron supplements and vitamins. To help your body use the iron from iron-rich foods, eat those foods at the same time as fresh fruits and vegetables that are high in vitamin C. Seek medical help if you have signs or symptoms of worsening anemia. This information is not intended to replace advice given to you by your health care provider. Make sure you discuss any questions you have with your health care provider. Document Revised: 06/04/2021 Document Reviewed: 06/04/2021 Elsevier Patient Education  2024 [employer]  

## 2022-11-13 NOTE — Progress Notes (Signed)
Subjective:    Patient ID: Taylor [name], female    DOB: 07/31/1990, 32 y.o.   MRN: [id number]  Chief Complaint  Patient presents with   Medical Management of Chronic Issues   PT presents to the office today for chronic follow up. She is followed by [facility] every 2 months for PTSD, GAD, Depression, and Bipolar.    She is followed by GI as needed for diarrhea, fecal urgency, and lymphocytic colitis.   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 She is doing PT and OT. She is doing dressing changes BID. She reports moderate swelling but she is moving and was standing all day yesterday.  Hypertension This is a chronic problem. The current episode started more than 1 year ago. The problem has been resolved since onset. The problem is controlled. Associated symptoms include anxiety and peripheral edema. Pertinent negatives include no malaise/fatigue or shortness of breath. Risk factors for coronary artery disease include obesity. Past treatments include ACE inhibitors. The current treatment provides moderate improvement.  Gastroesophageal Reflux She complains of belching, heartburn and a hoarse voice. This is a chronic problem. The current episode started more than 1 year ago. The problem occurs occasionally. Associated symptoms include fatigue. Risk factors include obesity. She has tried a PPI for the symptoms. The treatment provided moderate relief.  Anxiety Presents for follow-up visit. Symptoms include depressed mood, excessive worry, insomnia, irritability, nervous/anxious behavior and restlessness. Patient reports no shortness of breath. Symptoms occur most days. The severity of symptoms is moderate.   Her past medical history is significant for anemia.  Depression        This is a chronic problem.  The current episode started more than 1 year ago.   The problem occurs intermittently.  Associated symptoms include fatigue, hopelessness, insomnia, restlessness and sad.   Associated symptoms include no helplessness.     The symptoms are aggravated by family issues.  Past treatments include SNRIs - Serotonin and norepinephrine reuptake inhibitors.  Past medical history includes anxiety.   Anemia Presents for follow-up visit. There has been no malaise/fatigue.      Review of Systems  Constitutional:  Positive for fatigue and irritability. Negative for malaise/fatigue.  HENT:  Positive for hoarse voice.   Respiratory:  Negative for shortness of breath.   Gastrointestinal:  Positive for heartburn.  Psychiatric/Behavioral:  Positive for depression. The patient is nervous/anxious and has insomnia.   All other systems reviewed and are negative.      Objective:   Physical Exam Vitals reviewed.  Constitutional:      General: She is not in acute distress.    Appearance: She is well-developed. She is obese.  HENT:     Head: Normocephalic and atraumatic.     Right Ear: Tympanic membrane normal.     Left Ear: Tympanic membrane normal.  Eyes:     Pupils: Pupils are equal, round, and reactive to light.  Neck:     Thyroid: No thyromegaly.  Cardiovascular:     Rate and Rhythm: Normal rate and regular rhythm.     Heart sounds: Normal heart sounds. No murmur heard. Pulmonary:     Effort: Pulmonary effort is normal. No respiratory distress.     Breath sounds: Normal breath sounds. No wheezing.  Abdominal:     General: Bowel sounds are normal. There is no distension.     Palpations: Abdomen is soft.     Tenderness: There is no abdominal tenderness.  Musculoskeletal:  General: No tenderness.     Cervical back: Normal range of motion and neck supple.     Comments: Right BKA, mild swelling  Skin:    General: Skin is warm and dry.  Neurological:     Mental Status: She is alert and oriented to person, place, and time.     Cranial Nerves: No cranial nerve deficit.     Deep Tendon Reflexes: Reflexes are normal and symmetric.  Psychiatric:         Behavior: Behavior normal.        Thought Content: Thought content normal.        Judgment: Judgment normal.          BP 95/65   Pulse 93   Temp 97.8 F (36.6 C) (Temporal)   Ht 5\' 4"  (1.626 m)   SpO2 92%   BMI 48.58 kg/m   Assessment & Plan:   TIMBER LICKERT comes in today with chief complaint of Medical Management of Chronic Issues   Diagnosis and orders addressed:  1. S/P BKA (below knee amputation) unilateral, right (HCC) Keep follow up with specialists and PT and OT  2. Chronic depression  3. Current smoker  4. GAD (generalized anxiety disorder)  5. Gastroesophageal reflux disease without esophagitis  6. Secondary hypertension  7. Morbid obesity (HCC)  8. Mood disorder (HCC)  9. Iron deficiency anemia, unspecified iron deficiency anemia type - Anemia Profile B; Future Continue iron  Iron rich diet    Labs pending- Will get drawn in August. Continue iron.  Stop lisinopril, continue to monitor BP at home  Health Maintenance reviewed Diet and exercise encouraged  Follow up plan: 6 months   [name], FNP

## 2022-11-13 NOTE — Telephone Encounter (Signed)
Pt is s/p a revision BKA 10/16/2022. Last refill Oxycodone 10 mg #20 11/04/2022 requesting refill please advise.

## 2022-11-18 ENCOUNTER — Ambulatory Visit (INDEPENDENT_AMBULATORY_CARE_PROVIDER_SITE_OTHER): Payer: Medicare PPO | Admitting: Family

## 2022-11-18 ENCOUNTER — Telehealth: Payer: Self-pay | Admitting: Family

## 2022-11-18 ENCOUNTER — Encounter: Payer: Self-pay | Admitting: Family

## 2022-11-18 DIAGNOSIS — Z89511 Acquired absence of right leg below knee: Secondary | ICD-10-CM

## 2022-11-18 MED ORDER — DOXYCYCLINE HYCLATE 100 MG PO TABS: 100.0000 mg | ORAL_TABLET | Freq: Two times a day (BID) | ORAL | 0 refills | Status: DC

## 2022-11-18 MED ORDER — OXYCODONE HCL 5 MG PO CAPS: 5.0000 mg | ORAL_CAPSULE | Freq: Four times a day (QID) | ORAL | 0 refills | Status: DC | PRN

## 2022-11-18 MED ORDER — AMOXICILLIN-POT CLAVULANATE 500-125 MG PO TABS: 1.0000 | ORAL_TABLET | Freq: Three times a day (TID) | ORAL | 0 refills | Status: DC

## 2022-11-18 NOTE — Telephone Encounter (Signed)
Erin already sent in after pts appt today
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
## 2022-11-18 NOTE — Telephone Encounter (Signed)
Pt requesting Oxycodone refill please advise

## 2022-11-19 ENCOUNTER — Telehealth: Payer: Self-pay

## 2022-11-19 NOTE — Telephone Encounter (Signed)
Patient would like a call back concerning packing of wound and if she is supposed to be taking 2 antibiotics.  Cb# [phone].  Please advise.  Thank you.

## 2022-11-20 NOTE — Telephone Encounter (Signed)
Pt does have an appt on Monday 11/23/22. She has not returned my call.

## 2022-11-20 NOTE — Telephone Encounter (Signed)
LMTCB

## 2022-11-23 ENCOUNTER — Ambulatory Visit (INDEPENDENT_AMBULATORY_CARE_PROVIDER_SITE_OTHER): Payer: Medicare PPO | Admitting: Orthopedic Surgery

## 2022-11-23 DIAGNOSIS — Z89511 Acquired absence of right leg below knee: Secondary | ICD-10-CM

## 2022-11-23 MED ORDER — OXYCODONE HCL 5 MG PO CAPS: 5.0000 mg | ORAL_CAPSULE | Freq: Four times a day (QID) | ORAL | 0 refills | Status: DC | PRN

## 2022-11-24 ENCOUNTER — Other Ambulatory Visit: Payer: Self-pay | Admitting: Orthopedic Surgery

## 2022-11-24 ENCOUNTER — Telehealth: Payer: Self-pay | Admitting: Orthopedic Surgery

## 2022-11-24 MED ORDER — OXYCODONE HCL 5 MG PO TABS: 5.0000 mg | ORAL_TABLET | Freq: Four times a day (QID) | ORAL | 0 refills | Status: DC | PRN

## 2022-11-24 NOTE — Telephone Encounter (Signed)
Patient called advised the packing of her wound on her right leg still have the cotton inside of the wound. Patient said the string came out but, the cotton did not.  The number to contact patient is [phone]

## 2022-11-24 NOTE — Telephone Encounter (Signed)
I called and sw pt to advise that we did not use cotton just the iodoform packing strip. Pt voiced she removed this today and packed with Silvercell. Will call if she has any questions.

## 2022-11-24 NOTE — Telephone Encounter (Signed)
Ptis s/p a BKA revision was in the office yesterday for follow up. [facility] does not have the Oxycodone 5 mg capsules that was sent in they have the tablets and pt asked if this could be sent in today.

## 2022-11-24 NOTE — Telephone Encounter (Signed)
Patient called. [facility] has the tablets of the oxycodone 5mg  not capsules. Would like RX sent in for the tablets.

## 2022-11-25 ENCOUNTER — Other Ambulatory Visit: Payer: Self-pay | Admitting: Family

## 2022-11-25 DIAGNOSIS — M4135 Thoracogenic scoliosis, thoracolumbar region: Secondary | ICD-10-CM | POA: Diagnosis not present

## 2022-11-25 DIAGNOSIS — M797 Fibromyalgia: Secondary | ICD-10-CM | POA: Diagnosis not present

## 2022-11-25 DIAGNOSIS — S88111S Complete traumatic amputation at level between knee and ankle, right lower leg, sequela: Secondary | ICD-10-CM | POA: Diagnosis not present

## 2022-11-25 DIAGNOSIS — M546 Pain in thoracic spine: Secondary | ICD-10-CM | POA: Diagnosis not present

## 2022-11-26 ENCOUNTER — Telehealth: Payer: Self-pay | Admitting: Orthopedic Surgery

## 2022-11-26 NOTE — Telephone Encounter (Signed)
Pt called back stating she has enough dressings to last tonight and tomorrow morning but she will run out by tomorrow needs Gauzes, silvercell, ABD pack and ace bandages

## 2022-11-26 NOTE — Telephone Encounter (Signed)
Patient called. Says she needs more supplies. Would like Autumn to call her. [phone]

## 2022-11-26 NOTE — Telephone Encounter (Signed)
I called pt and lm on vm to advise that I did try to get approval for home delivery wound care supplies but they do not accept her insurance. I would advise pt to go to pharmacy or [facility] for gauze and ace bandage and she can change this daily. Advised a less expensive option would be to purchase maxi pad and use to absorb drainage and ace bandage under shrinker she can call with any questions.

## 2022-11-27 NOTE — Telephone Encounter (Signed)
I called and left another message on vm for the pt to advise that we gave a few samples at her last office visit this week but she will have to go to the pharmacy or a medical supply store to purchase additional supplies. Reviewed information from the prevision message and advise to call with any questions.

## 2022-11-30 ENCOUNTER — Encounter: Payer: Medicare PPO | Admitting: Physical Medicine & Rehabilitation

## 2022-12-01 ENCOUNTER — Telehealth: Payer: Self-pay | Admitting: Radiology

## 2022-12-01 ENCOUNTER — Other Ambulatory Visit: Payer: Self-pay | Admitting: Orthopedic Surgery

## 2022-12-01 ENCOUNTER — Telehealth: Payer: Self-pay | Admitting: Orthopedic Surgery

## 2022-12-01 MED ORDER — OXYCODONE HCL 5 MG PO TABS: 5.0000 mg | ORAL_TABLET | Freq: Three times a day (TID) | ORAL | 0 refills | Status: DC | PRN

## 2022-12-01 NOTE — Telephone Encounter (Signed)
Patient called. Would like a refill on oxycodone. Also would like Autumn to call her.

## 2022-12-01 NOTE — Telephone Encounter (Signed)
Pt asking for refill on Oxycodone 5 mg last refill was 11/24/2022 #30 s/p BKA revision

## 2022-12-01 NOTE — Telephone Encounter (Signed)
FYI  Patient called triage, she has red spots that have come up all over her body, on the face, amputation site, etc. She states that she feels like she has had a fever for a few days. She also states there are holes in her incision and that she has been unable to pack them as she ran out of supplies. She is covering with a gauze.  Advised patient, as she called at closing time for office, that she needs to go to ED to be evaluated. She will go to [facility].

## 2022-12-01 NOTE — Telephone Encounter (Signed)
Pt informed

## 2022-12-02 ENCOUNTER — Telehealth: Payer: Self-pay | Admitting: Orthopedic Surgery

## 2022-12-02 ENCOUNTER — Other Ambulatory Visit (INDEPENDENT_AMBULATORY_CARE_PROVIDER_SITE_OTHER): Payer: Medicare PPO

## 2022-12-02 ENCOUNTER — Encounter: Payer: Self-pay | Admitting: Family

## 2022-12-02 ENCOUNTER — Ambulatory Visit (INDEPENDENT_AMBULATORY_CARE_PROVIDER_SITE_OTHER): Payer: Medicare PPO | Admitting: Family

## 2022-12-02 DIAGNOSIS — M25562 Pain in left knee: Secondary | ICD-10-CM | POA: Diagnosis not present

## 2022-12-02 DIAGNOSIS — T8781 Dehiscence of amputation stump: Secondary | ICD-10-CM

## 2022-12-02 MED ORDER — LIDOCAINE HCL 1 % IJ SOLN
5.0000 mL | INTRAMUSCULAR | Status: AC | PRN
Start: 2022-12-02 — End: 2022-12-02
  Administered 2022-12-02: 5 mL

## 2022-12-02 MED ORDER — METHYLPREDNISOLONE ACETATE 40 MG/ML IJ SUSP
40.0000 mg | INTRAMUSCULAR | Status: AC | PRN
Start: 2022-12-02 — End: 2022-12-02
  Administered 2022-12-02: 40 mg via INTRA_ARTICULAR

## 2022-12-02 NOTE — Telephone Encounter (Signed)
Pam from [employer] called. Says patient's insurance only allows 1 primary and 1 secondary for the wraps. Would like a call back. [phone]

## 2022-12-02 NOTE — Telephone Encounter (Signed)
Thanks [employer].  She is supposed to be coming in today to see Denny Peon for her left knee. We will get her checked out. If she doesn't show up I will call and check on her.
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
## 2022-12-02 NOTE — Telephone Encounter (Signed)
I called and sw Jomarie Longs with [employer] and changed the order to a silver alginate for the primary and kerlix roll for the secondary. This order was modified and they will ship supply to the pt.

## 2022-12-03 ENCOUNTER — Encounter: Payer: Self-pay | Admitting: Orthopedic Surgery

## 2022-12-03 DIAGNOSIS — Z89511 Acquired absence of right leg below knee: Secondary | ICD-10-CM | POA: Diagnosis not present
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
## 2022-12-04 ENCOUNTER — Other Ambulatory Visit: Payer: Self-pay

## 2022-12-04 DIAGNOSIS — D72829 Elevated white blood cell count, unspecified: Secondary | ICD-10-CM

## 2022-12-07 ENCOUNTER — Ambulatory Visit (INDEPENDENT_AMBULATORY_CARE_PROVIDER_SITE_OTHER): Payer: Medicare PPO | Admitting: Orthopedic Surgery

## 2022-12-07 ENCOUNTER — Inpatient Hospital Stay: Payer: Medicaid Other

## 2022-12-07 ENCOUNTER — Other Ambulatory Visit: Payer: Self-pay | Admitting: *Deleted

## 2022-12-07 DIAGNOSIS — T8781 Dehiscence of amputation stump: Secondary | ICD-10-CM

## 2022-12-07 DIAGNOSIS — Z89511 Acquired absence of right leg below knee: Secondary | ICD-10-CM

## 2022-12-07 NOTE — Addendum Note (Signed)
Addended by: Cynda Acres A on: 12/07/2022 10:44 AM   Modules accepted: Orders

## 2022-12-14 ENCOUNTER — Other Ambulatory Visit: Payer: Self-pay | Admitting: Orthopedic Surgery

## 2022-12-14 ENCOUNTER — Telehealth: Payer: Self-pay | Admitting: Orthopedic Surgery

## 2022-12-14 ENCOUNTER — Inpatient Hospital Stay: Payer: Medicare PPO | Attending: Physician Assistant | Admitting: Oncology

## 2022-12-14 MED ORDER — OXYCODONE HCL 5 MG PO TABS: 5.0000 mg | ORAL_TABLET | Freq: Three times a day (TID) | ORAL | 0 refills | Status: DC | PRN

## 2022-12-14 MED ORDER — OXYCODONE HCL 5 MG PO CAPS: 5.0000 mg | ORAL_CAPSULE | Freq: Four times a day (QID) | ORAL | 0 refills | Status: DC | PRN
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
## 2022-12-14 NOTE — Telephone Encounter (Signed)
Ms. Corr states that the pharmacy called her and they do not carry the oxycodone capsules.  Can you please change Rx to tablets.  She uses the [facility] on [address] in [place].  Her call back # is [phone].

## 2022-12-14 NOTE — Telephone Encounter (Signed)
You refilled this medication for the pt this morning. Please see below

## 2022-12-22 ENCOUNTER — Encounter: Payer: Self-pay | Admitting: Orthopedic Surgery
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
## 2022-12-24 ENCOUNTER — Encounter: Payer: Medicare PPO | Attending: Physical Medicine & Rehabilitation | Admitting: Physical Medicine & Rehabilitation

## 2022-12-24 ENCOUNTER — Encounter: Payer: Self-pay | Admitting: Physical Medicine & Rehabilitation

## 2022-12-24 VITALS — BP 139/90 | HR 84 | Ht 64.0 in

## 2022-12-24 DIAGNOSIS — G546 Phantom limb syndrome with pain: Secondary | ICD-10-CM | POA: Diagnosis not present

## 2022-12-24 DIAGNOSIS — Z89511 Acquired absence of right leg below knee: Secondary | ICD-10-CM | POA: Diagnosis not present

## 2022-12-24 DIAGNOSIS — G5603 Carpal tunnel syndrome, bilateral upper limbs: Secondary | ICD-10-CM | POA: Diagnosis not present

## 2022-12-24 DIAGNOSIS — F319 Bipolar disorder, unspecified: Secondary | ICD-10-CM | POA: Diagnosis not present

## 2022-12-24 MED ORDER — CYCLOBENZAPRINE HCL 5 MG PO TABS: 5.0000 mg | ORAL_TABLET | Freq: Three times a day (TID) | ORAL | 0 refills | Status: DC | PRN

## 2022-12-24 MED ORDER — PREGABALIN 75 MG PO CAPS: 75.0000 mg | ORAL_CAPSULE | Freq: Two times a day (BID) | ORAL | 2 refills | Status: DC
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
## 2022-12-26 DIAGNOSIS — S88111S Complete traumatic amputation at level between knee and ankle, right lower leg, sequela: Secondary | ICD-10-CM | POA: Diagnosis not present

## 2022-12-26 DIAGNOSIS — M4135 Thoracogenic scoliosis, thoracolumbar region: Secondary | ICD-10-CM | POA: Diagnosis not present

## 2022-12-26 DIAGNOSIS — M546 Pain in thoracic spine: Secondary | ICD-10-CM | POA: Diagnosis not present

## 2022-12-26 DIAGNOSIS — M797 Fibromyalgia: Secondary | ICD-10-CM | POA: Diagnosis not present

## 2022-12-28 ENCOUNTER — Ambulatory Visit: Payer: Medicare PPO | Admitting: Orthopedic Surgery

## 2022-12-31 ENCOUNTER — Other Ambulatory Visit: Payer: Self-pay | Admitting: Orthopedic Surgery

## 2022-12-31 MED ORDER — OXYCODONE HCL 5 MG PO TABS: 5.0000 mg | ORAL_TABLET | Freq: Three times a day (TID) | ORAL | 0 refills | Status: DC | PRN

## 2023-01-14 ENCOUNTER — Ambulatory Visit: Payer: Medicare PPO | Admitting: Orthopedic Surgery

## 2023-01-19 ENCOUNTER — Encounter: Payer: Self-pay | Admitting: Orthopedic Surgery

## 2023-01-21 ENCOUNTER — Telehealth: Payer: Medicare PPO | Admitting: Family

## 2023-01-26 ENCOUNTER — Encounter: Payer: Self-pay | Admitting: Family

## 2023-01-26 ENCOUNTER — Ambulatory Visit (INDEPENDENT_AMBULATORY_CARE_PROVIDER_SITE_OTHER): Payer: Medicare PPO | Admitting: Family

## 2023-01-26 DIAGNOSIS — T8781 Dehiscence of amputation stump: Secondary | ICD-10-CM | POA: Diagnosis not present

## 2023-01-26 DIAGNOSIS — M25562 Pain in left knee: Secondary | ICD-10-CM | POA: Diagnosis not present

## 2023-01-26 DIAGNOSIS — M1712 Unilateral primary osteoarthritis, left knee: Secondary | ICD-10-CM

## 2023-01-26 MED ORDER — OXYCODONE HCL 5 MG PO TABS: 5.0000 mg | ORAL_TABLET | Freq: Two times a day (BID) | ORAL | 0 refills | Status: DC | PRN

## 2023-01-26 MED ORDER — MUPIROCIN 2 % EX OINT: 1.0000 | TOPICAL_OINTMENT | Freq: Two times a day (BID) | CUTANEOUS | 0 refills | Status: DC

## 2023-01-26 MED ORDER — DOXYCYCLINE HYCLATE 100 MG PO TABS: 100.0000 mg | ORAL_TABLET | Freq: Two times a day (BID) | ORAL | 0 refills | Status: DC
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
## 2023-01-29 ENCOUNTER — Telehealth: Payer: Self-pay

## 2023-01-29 NOTE — Telephone Encounter (Signed)
-----   Message from Adonis Huguenin sent at 01/26/2023 10:00 AM EDT ----- Supp injection left knee polease

## 2023-01-29 NOTE — Telephone Encounter (Signed)
VOB submitted for Monovisc, left knee

## 2023-02-04 ENCOUNTER — Telehealth: Payer: Medicare PPO | Admitting: Family

## 2023-02-04 ENCOUNTER — Other Ambulatory Visit: Payer: Self-pay

## 2023-02-04 ENCOUNTER — Encounter: Payer: Self-pay | Admitting: Family

## 2023-02-04 DIAGNOSIS — M1712 Unilateral primary osteoarthritis, left knee: Secondary | ICD-10-CM

## 2023-02-04 DIAGNOSIS — I159 Secondary hypertension, unspecified: Secondary | ICD-10-CM

## 2023-02-04 DIAGNOSIS — T8781 Dehiscence of amputation stump: Secondary | ICD-10-CM | POA: Diagnosis not present

## 2023-02-04 DIAGNOSIS — F319 Bipolar disorder, unspecified: Secondary | ICD-10-CM

## 2023-02-04 DIAGNOSIS — G4700 Insomnia, unspecified: Secondary | ICD-10-CM

## 2023-02-04 DIAGNOSIS — S88111S Complete traumatic amputation at level between knee and ankle, right lower leg, sequela: Secondary | ICD-10-CM | POA: Diagnosis not present

## 2023-02-04 DIAGNOSIS — F411 Generalized anxiety disorder: Secondary | ICD-10-CM

## 2023-02-04 DIAGNOSIS — F321 Major depressive disorder, single episode, moderate: Secondary | ICD-10-CM

## 2023-02-04 DIAGNOSIS — F17200 Nicotine dependence, unspecified, uncomplicated: Secondary | ICD-10-CM

## 2023-02-04 DIAGNOSIS — K219 Gastro-esophageal reflux disease without esophagitis: Secondary | ICD-10-CM

## 2023-02-04 MED ORDER — VRAYLAR 1.5 MG PO CAPS
1.5000 mg | ORAL_CAPSULE | Freq: Every day | ORAL | 1 refills | Status: DC
Start: 2023-02-04 — End: 2023-03-04

## 2023-02-04 MED ORDER — TRAZODONE HCL 100 MG PO TABS
100.0000 mg | ORAL_TABLET | Freq: Every day | ORAL | 1 refills | Status: DC
Start: 2023-02-04 — End: 2023-03-04

## 2023-02-04 MED ORDER — DULOXETINE HCL 60 MG PO CPEP
120.0000 mg | ORAL_CAPSULE | Freq: Every day | ORAL | 0 refills | Status: DC
Start: 2023-02-04 — End: 2023-03-04

## 2023-02-04 MED ORDER — BUPROPION HCL ER (XL) 300 MG PO TB24
300.0000 mg | ORAL_TABLET | Freq: Every morning | ORAL | 0 refills | Status: DC
Start: 2023-02-04 — End: 2023-03-03

## 2023-02-04 NOTE — Patient Instructions (Signed)
Insomnia Insomnia is a sleep disorder that makes it difficult to fall asleep or stay asleep. Insomnia can cause fatigue, low energy, difficulty concentrating, mood swings, and poor performance at work or school. There are three different ways to classify insomnia: Difficulty falling asleep. Difficulty staying asleep. Waking up too early in the morning. Any type of insomnia can be long-term (chronic) or short-term (acute). Both are common. Short-term insomnia usually lasts for 3 months or less. Chronic insomnia occurs at least three times a week for longer than 3 months. What are the causes? Insomnia may be caused by another condition, situation, or substance, such as: Having certain mental health conditions, such as anxiety and depression. Using caffeine, alcohol, tobacco, or drugs. Having gastrointestinal conditions, such as gastroesophageal reflux disease (GERD). Having certain medical conditions. These include: Asthma. Alzheimer's disease. Stroke. Chronic pain. An overactive thyroid gland (hyperthyroidism). Other sleep disorders, such as restless legs syndrome and sleep apnea. Menopause. Sometimes, the cause of insomnia may not be known. What increases the risk? Risk factors for insomnia include: Gender. Females are affected more often than males. Age. Insomnia is more common as people get older. Stress and certain medical and mental health conditions. Lack of exercise. Having an irregular work schedule. This may include working night shifts and traveling between different time zones. What are the signs or symptoms? If you have insomnia, the main symptom is having trouble falling asleep or having trouble staying asleep. This may lead to other symptoms, such as: Feeling tired or having low energy. Feeling nervous about going to sleep. Not feeling rested in the morning. Having trouble concentrating. Feeling irritable, anxious, or depressed. How is this diagnosed? This condition  may be diagnosed based on: Your symptoms and medical history. Your health care provider may ask about: Your sleep habits. Any medical conditions you have. Your mental health. A physical exam. How is this treated? Treatment for insomnia depends on the cause. Treatment may focus on treating an underlying condition that is causing the insomnia. Treatment may also include: Medicines to help you sleep. Counseling or therapy. Lifestyle adjustments to help you sleep better. Follow these instructions at home: Eating and drinking  Limit or avoid alcohol, caffeinated beverages, and products that contain nicotine and tobacco, especially close to bedtime. These can disrupt your sleep. Do not eat a large meal or eat spicy foods right before bedtime. This can lead to digestive discomfort that can make it hard for you to sleep. Sleep habits  Keep a sleep diary to help you and your health care provider figure out what could be causing your insomnia. Write down: When you sleep. When you wake up during the night. How well you sleep and how rested you feel the next day. Any side effects of medicines you are taking. What you eat and drink. Make your bedroom a dark, comfortable place where it is easy to fall asleep. Put up shades or blackout curtains to block light from outside. Use a white noise machine to block noise. Keep the temperature cool. Limit screen use before bedtime. This includes: Not watching TV. Not using your smartphone, tablet, or computer. Stick to a routine that includes going to bed and waking up at the same times every day and night. This can help you fall asleep faster. Consider making a quiet activity, such as reading, part of your nighttime routine. Try to avoid taking naps during the day so that you sleep better at night. Get out of bed if you are still awake after   15 minutes of trying to sleep. Keep the lights down, but try reading or doing a quiet activity. When you feel  sleepy, go back to bed. General instructions Take over-the-counter and prescription medicines only as told by your health care provider. Exercise regularly as told by your health care provider. However, avoid exercising in the hours right before bedtime. Use relaxation techniques to manage stress. Ask your health care provider to suggest some techniques that may work well for you. These may include: Breathing exercises. Routines to release muscle tension. Visualizing peaceful scenes. Make sure that you drive carefully. Do not drive if you feel very sleepy. Keep all follow-up visits. This is important. Contact a health care provider if: You are tired throughout the day. You have trouble in your daily routine due to sleepiness. You continue to have sleep problems, or your sleep problems get worse. Get help right away if: You have thoughts about hurting yourself or someone else. Get help right away if you feel like you may hurt yourself or others, or have thoughts about taking your own life. Go to your nearest emergency room or: Call 911. Call the National Suicide Prevention Lifeline at 1-800-273-8255 or 988. This is open 24 hours a day. Text the Crisis Text Line at 741741. Summary Insomnia is a sleep disorder that makes it difficult to fall asleep or stay asleep. Insomnia can be long-term (chronic) or short-term (acute). Treatment for insomnia depends on the cause. Treatment may focus on treating an underlying condition that is causing the insomnia. Keep a sleep diary to help you and your health care provider figure out what could be causing your insomnia. This information is not intended to replace advice given to you by your health care provider. Make sure you discuss any questions you have with your health care provider. Document Revised: 04/07/2021 Document Reviewed: 04/07/2021 Elsevier Patient Education  2024 [employer]  

## 2023-02-04 NOTE — Progress Notes (Signed)
Virtual Visit Consent   Taylor [name], you are scheduled for a virtual visit with a [facility] provider today. Just as with appointments in the office, your consent must be obtained to participate. Your consent will be active for this visit and any virtual visit you may have with one of our providers in the next 365 days. If you have a MyChart account, a copy of this consent can be sent to you electronically.  As this is a virtual visit, video technology does not allow for your provider to perform a traditional examination. This may limit your provider's ability to fully assess your condition. If your provider identifies any concerns that need to be evaluated in person or the need to arrange testing (such as labs, EKG, etc.), we will make arrangements to do so. Although advances in technology are sophisticated, we cannot ensure that it will always work on either your end or our end. If the connection with a video visit is poor, the visit may have to be switched to a telephone visit. With either a video or telephone visit, we are not always able to ensure that we have a secure connection.  By engaging in this virtual visit, you consent to the provision of healthcare and authorize for your insurance to be billed (if applicable) for the services provided during this visit. Depending on your insurance coverage, you may receive a charge related to this service.  I need to obtain your verbal consent now. Are you willing to proceed with your visit today? Taylor [name] has provided verbal consent on 02/04/2023 for a virtual visit (video or telephone). Taylor Rodney, FNP  Date: 02/04/2023 3:20 PM  Virtual Visit via Video Note   I, Taylor [name], connected with  Taylor [name]  ([id number], 1990-12-09) on 02/04/23 at  2:40 PM EDT by a video-enabled telemedicine application and verified that I am speaking with the correct person using two identifiers.  Location: Patient: Virtual Visit Location  Patient: Home Provider: Virtual Visit Location Provider: Office/Clinic   I discussed the limitations of evaluation and management by telemedicine and the availability of in person appointments. The patient expressed understanding and agreed to proceed.    History of Present Illness: Taylor [name] is a 32 y.o. who identifies as a female who was assigned female at birth, and is being seen today for chronic follow up. She is followed by [facility] every 2 months for PTSD, GAD, Depression, and Bipolar. She reports she needs a new office that is closer to home.    She is followed by GI as needed for diarrhea, fecal urgency, and lymphocytic colitis.    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 She completed PT and OT. She is doing dressing changes daily. She is followed by Ortho and was started on doxycyline and Bactroban for dehiscence  of amputation stump.    HPI: Hypertension This is a chronic problem. The current episode started more than 1 year ago. The problem has been resolved since onset. The problem is controlled. Associated symptoms include anxiety, malaise/fatigue and peripheral edema. Pertinent negatives include no shortness of breath. Risk factors for coronary artery disease include dyslipidemia, obesity and sedentary lifestyle. The current treatment provides moderate improvement.  Gastroesophageal Reflux She complains of belching and heartburn. This is a chronic problem. The current episode started more than 1 year ago. The problem occurs occasionally. Risk factors include obesity. She has tried a PPI for the symptoms. The treatment provided moderate relief.  Depression        This is a chronic problem.  The current episode started more than 1 year ago.   The onset quality is gradual.   The problem occurs intermittently.  Associated symptoms include helplessness, hopelessness, restlessness and sad.  Past treatments include SNRIs - Serotonin and norepinephrine reuptake  inhibitors.  Past medical history includes anxiety.   Anxiety Presents for follow-up visit. Symptoms include depressed mood, excessive worry, nervous/anxious behavior, panic and restlessness. Patient reports no shortness of breath. Symptoms occur most days. The severity of symptoms is moderate.      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, right, sequela (HCC) 09/18/2022   Septic arthritis of right ankle (HCC) 09/16/2022   S/P BKA (below knee amputation) unilateral, right (HCC) 09/16/2022   Charcot ankle, 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   Osteomyelitis of right ankle (HCC) 09/12/2022   Acute osteomyelitis of right foot (HCC) 09/09/2022   HTN (hypertension) 09/09/2022   H/O ankle fusion 03/11/2022   Prolapsed internal hemorrhoids, grade 2 09/16/2021   Displaced pilon fracture of right tibia, initial encounter for closed fracture    Lymphocytic colitis 05/16/2021   Bloating 11/28/2020   Incontinence of feces with fecal urgency 11/15/2020   Sprain of anterior talofibular ligament of right ankle    Impingement of right ankle joint    PTSD (post-traumatic stress disorder) 10/04/2020   Migraine without aura and without status migrainosus, not intractable 10/04/2020   Hemorrhoids 01/30/2020   Leukocytosis 09/20/2019   Pain in left foot 09/14/2019   Chronic depression 08/04/2019   Chronic back pain 08/04/2019   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HCC) 08/04/2019   Lumbar radiculopathy 08/04/2019   IUD (intrauterine device) in place 04/27/2019   Morbid obesity (HCC)  01/23/2019   H/O Abnormal LFTs 10/17/2018   Bicuspid aortic valve    GAD (generalized anxiety disorder) 06/30/2018   GERD (gastroesophageal reflux disease) 06/06/2018   Current smoker 06/06/2018   Depression, major, single episode, moderate (HCC) 06/06/2018    Allergies:  Allergies  Allergen Reactions   Other Itching    Animal Dander   Medications:  Current Outpatient Medications:    traZODone (DESYREL) 100 MG tablet, Take 1 tablet (100 mg total) by mouth at bedtime., Disp: 90 tablet, Rfl: 1   acetaminophen (TYLENOL) 325 MG tablet, Take 1-2 tablets (325-650 mg total) by mouth every 4 (four) hours as needed for mild pain., Disp: , Rfl:    buPROPion (WELLBUTRIN XL) 300 MG 24 hr tablet, Take 1 tablet (300 mg total) by mouth every morning., Disp: 30 tablet, Rfl: 0   doxycycline (VIBRA-TABS) 100 MG tablet, Take 1 tablet (100 mg total) by mouth 2 (two) times daily., Disp: 28 tablet, Rfl: 0   DULoxetine (CYMBALTA) 60 MG capsule, Take 2 capsules (120 mg total) by mouth daily., Disp: 60 capsule, Rfl: 0   mupirocin ointment (BACTROBAN) 2 %, Apply 1 Application topically 2 (two) times daily., Disp: 22 g, Rfl: 0   pantoprazole (PROTONIX) 40 MG tablet, Take 1 tablet (40 mg total) by mouth 2 (two) times daily., Disp: 60 tablet,  Rfl: 3   pregabalin (LYRICA) 75 MG capsule, Take 1 capsule (75 mg total) by mouth 2 (two) times daily., Disp: 60 capsule, Rfl: 2   VRAYLAR 1.5 MG capsule, Take 1 capsule (1.5 mg total) by mouth daily., Disp: 90 capsule, Rfl: 1  Observations/Objective: Patient is well-developed, well-nourished in no acute distress.  Resting comfortably  at home.  Head is normocephalic, atraumatic.  No labored breathing.  Speech is clear and coherent with logical content.  Patient is alert and oriented at baseline.  Wound on right BKA is dressed  Assessment and Plan: 1. Bipolar I disorder (HCC) - Ambulatory referral to Psychiatry - buPROPion (WELLBUTRIN XL) 300 MG 24 hr tablet; Take 1  tablet (300 mg total) by mouth every morning.  Dispense: 30 tablet; Refill: 0 - DULoxetine (CYMBALTA) 60 MG capsule; Take 2 capsules (120 mg total) by mouth daily.  Dispense: 60 capsule; Refill: 0 - VRAYLAR 1.5 MG capsule; Take 1 capsule (1.5 mg total) by mouth daily.  Dispense: 90 capsule; Refill: 1  2. 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 Dehiscence of amputation stump of right lower extremity (HCC)  4. Current smoker  5. Depression, major, single episode, moderate (HCC) - Ambulatory referral to Psychiatry - buPROPion (WELLBUTRIN XL) 300 MG 24 hr tablet; Take 1 tablet (300 mg total) by mouth every morning.  Dispense: 30 tablet; Refill: 0 - DULoxetine (CYMBALTA) 60 MG capsule; Take 2 capsules (120 mg total) by mouth daily.  Dispense: 60 capsule; Refill: 0 - VRAYLAR 1.5 MG capsule; Take 1 capsule (1.5 mg total) by mouth daily.  Dispense: 90 capsule; Refill: 1  6. Gastroesophageal reflux disease without esophagitis  7. GAD (generalized anxiety disorder) - Ambulatory referral to Psychiatry - buPROPion (WELLBUTRIN XL) 300 MG 24 hr tablet; Take 1 tablet (300 mg total) by mouth every morning.  Dispense: 30 tablet; Refill: 0 - DULoxetine (CYMBALTA) 60 MG capsule; Take 2 capsules (120 mg total) by mouth daily.  Dispense: 60 capsule; Refill: 0 - VRAYLAR 1.5 MG capsule; Take 1 capsule (1.5 mg total) by mouth daily.  Dispense: 90 capsule; Refill: 1  8. Secondary hypertension  9. Morbid obesity (HCC)  10. Insomnia, unspecified type - traZODone (DESYREL) 100 MG tablet; Take 1 tablet (100 mg total) by mouth at bedtime.  Dispense: 90 tablet; Refill: 1  Continue current medications  Referral placed for new Behavorial Health Start Trazodone 100 mg  Sleep ritual  Follow up in 3 months   Follow Up Instructions: I discussed the assessment and treatment plan with the patient. The patient was provided an opportunity to ask questions and all were answered. The  patient agreed with the plan and demonstrated an understanding of the instructions.  A copy of instructions were sent to the patient via MyChart unless otherwise noted below.     The patient was advised to call back or seek an in-person evaluation if the symptoms worsen or if the condition fails to improve as anticipated.  Time:  I spent 18 minutes with the patient via telehealth technology discussing the above problems/concerns.    Taylor Rodney, FNP

## 2023-02-09 ENCOUNTER — Ambulatory Visit: Payer: Medicare PPO | Admitting: Family

## 2023-02-12 ENCOUNTER — Encounter: Payer: Medicare PPO | Admitting: Physical Medicine & Rehabilitation

## 2023-02-16 NOTE — Telephone Encounter (Signed)
She really needs a revision but was resistant last time I saw her -- needs to be seen in office

## 2023-02-17 ENCOUNTER — Ambulatory Visit: Payer: Medicare PPO | Admitting: Family

## 2023-02-19 ENCOUNTER — Ambulatory Visit: Payer: Medicare PPO | Admitting: Family

## 2023-02-19 ENCOUNTER — Encounter: Payer: Self-pay | Admitting: Family

## 2023-02-19 DIAGNOSIS — T8781 Dehiscence of amputation stump: Secondary | ICD-10-CM

## 2023-02-19 MED ORDER — MUPIROCIN 2 % EX OINT: 1.0000 | TOPICAL_OINTMENT | Freq: Two times a day (BID) | CUTANEOUS | 0 refills | Status: DC
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
## 2023-03-03 ENCOUNTER — Other Ambulatory Visit: Payer: Self-pay | Admitting: Family

## 2023-03-03 DIAGNOSIS — K219 Gastro-esophageal reflux disease without esophagitis: Secondary | ICD-10-CM

## 2023-03-03 DIAGNOSIS — F319 Bipolar disorder, unspecified: Secondary | ICD-10-CM

## 2023-03-03 DIAGNOSIS — F411 Generalized anxiety disorder: Secondary | ICD-10-CM

## 2023-03-03 DIAGNOSIS — F321 Major depressive disorder, single episode, moderate: Secondary | ICD-10-CM

## 2023-03-03 NOTE — Telephone Encounter (Signed)
Pt called stating that she needs a refill on her acid reflux Rx because she has been without it for 2 days and is having an acid reflux flare up. Pt is scheduled for video visit tomorrow to get more refills but is requesting that a few be sent in for her today. Please advise.

## 2023-03-04 ENCOUNTER — Encounter: Payer: Self-pay | Admitting: Family

## 2023-03-04 ENCOUNTER — Telehealth: Payer: Medicare PPO | Admitting: Family

## 2023-03-04 ENCOUNTER — Encounter: Payer: Medicare PPO | Admitting: Physical Medicine & Rehabilitation

## 2023-03-04 DIAGNOSIS — G43009 Migraine without aura, not intractable, without status migrainosus: Secondary | ICD-10-CM

## 2023-03-04 DIAGNOSIS — F17200 Nicotine dependence, unspecified, uncomplicated: Secondary | ICD-10-CM

## 2023-03-04 DIAGNOSIS — K219 Gastro-esophageal reflux disease without esophagitis: Secondary | ICD-10-CM | POA: Diagnosis not present

## 2023-03-04 DIAGNOSIS — F411 Generalized anxiety disorder: Secondary | ICD-10-CM

## 2023-03-04 DIAGNOSIS — F4310 Post-traumatic stress disorder, unspecified: Secondary | ICD-10-CM | POA: Diagnosis not present

## 2023-03-04 DIAGNOSIS — G546 Phantom limb syndrome with pain: Secondary | ICD-10-CM | POA: Diagnosis not present

## 2023-03-04 DIAGNOSIS — I159 Secondary hypertension, unspecified: Secondary | ICD-10-CM | POA: Diagnosis not present

## 2023-03-04 DIAGNOSIS — F39 Unspecified mood [affective] disorder: Secondary | ICD-10-CM

## 2023-03-04 DIAGNOSIS — G4700 Insomnia, unspecified: Secondary | ICD-10-CM

## 2023-03-04 DIAGNOSIS — Z89511 Acquired absence of right leg below knee: Secondary | ICD-10-CM | POA: Diagnosis not present

## 2023-03-04 DIAGNOSIS — F319 Bipolar disorder, unspecified: Secondary | ICD-10-CM

## 2023-03-04 DIAGNOSIS — F321 Major depressive disorder, single episode, moderate: Secondary | ICD-10-CM

## 2023-03-04 MED ORDER — BUPROPION HCL ER (XL) 300 MG PO TB24: 300.0000 mg | ORAL_TABLET | Freq: Every day | ORAL | 1 refills | Status: DC

## 2023-03-04 MED ORDER — VRAYLAR 1.5 MG PO CAPS: 1.5000 mg | ORAL_CAPSULE | Freq: Every day | ORAL | 1 refills | Status: AC

## 2023-03-04 MED ORDER — DULOXETINE HCL 60 MG PO CPEP
120.0000 mg | ORAL_CAPSULE | Freq: Every day | ORAL | 1 refills | Status: DC
Start: 2023-03-04 — End: 2023-11-16

## 2023-03-04 MED ORDER — PANTOPRAZOLE SODIUM 40 MG PO TBEC: 80.0000 mg | DELAYED_RELEASE_TABLET | Freq: Every day | ORAL | 1 refills | Status: DC

## 2023-03-04 MED ORDER — TRAZODONE HCL 100 MG PO TABS: 100.0000 mg | ORAL_TABLET | Freq: Every day | ORAL | 1 refills | Status: DC

## 2023-03-04 NOTE — Patient Instructions (Signed)
Phantom Limb Pain Phantom limb pain is pain in a body part that no longer exists. It usually happens in an arm or leg after it has been surgically removed (amputated). Most cases of phantom limb pain are brief. However, it can last for years, and it may be severe and disabling. The exact mechanism of how phantom limb pain occurs is not known. The problem may start in a part of the brain that processes feelings and awareness (sensations) from the rest of the body (sensory cortex). When a body part is lost, the sensory cortex may not be able to handle the loss and may reorganize (rewire) itself to make up for the lost signals. What are the causes? This condition only happens in patients with amputations, but the cause is not known. It may be caused by: Damaged nerve endings. Scar tissue. Rewiring of nerves in the brain or spine. What are the signs or symptoms? Symptoms vary and are related to the lost limb or the remaining stump. Stump pain may be mistaken for phantom limb pain, or you may have both at the same time. Symptoms include: Phantom pain. Pain often feels like the pain you had before the amputation. It usually comes and goes, and it gets better over time. The pain may feel like: Burning. Stabbing. Throbbing. Cramping. Prickling. Crushing. Telescoping. This is when the pain moves over time from the farthest part of the amputated limb (fingers or toes) up to the site of amputation, as if the limb is shrinking. Phantom sensation. This is a feeling other than pain, as if the limb is still part of the body. Physical or emotional factors can trigger or worsen pain sensations. Those factors may include: Weather changes. Stress. Strong emotions. Certain positions or movements of the body. Pressure on the affected area. How is this diagnosed? Diagnosis is based on your history of amputation and symptoms that you have after surgery. Your health care provider may: Do a physical exam. Talk  with you about your symptoms and past history of pain. You may have imaging tests to examine your stump, such as X-rays or CT scan. How is this treated? There are different therapies and medicines that may give you relief. Treatment options may include: Pain medicine. Medicine can be given for pain right after surgery (acute pain) and for pain that goes on for some time (chronic pain). Commonly used medicines include: Antidepressant medicine. Anticonvulsant medicine. Narcotics, analgesics, or anti-inflammatory medicine. Nerve blocks. Techniques that help to retrain the brain and nervous system (movement representation techniques), such as: Looking at your unaffected limb in a mirror and thinking about painless movement of your extremity (mirror therapy). Thinking about moving your limbs without actual movement (motor therapy). Watching and sensing the movement of other people (action observation). Attaching a myoelectric sensor on the stump to detect the muscle potential and predict the motion the person wants to perform (virtual reality). Sensory discrimination training. For this treatment, painless stimulation is applied to different parts of your stump and you describe what you feel. This may help with nerve rewiring. Physical therapy involving the stump, which may include: Exercise. This may be physical movement, or it may involve applying sound waves (ultrasound) or tapping (percussion therapy) to the stump. These exercises may help to heal and retrain tissue and nerves. Massage. Stump massage creates new sensations, breaks up scar tissue, and prepares the stump for an artificial limb (prosthesis). Heat or cold treatment. This can improve blood flow and reduce inflammation. Applying painless electrical  pulses to the skin to prevent sensations of pain from reaching the brain (transcutaneous electrical nerve stimulation, TENS). Complementary therapies, such as: Acupuncture. Relaxation  techniques. These often involve hypnosis, guided imagery, deep breathing, and muscle relaxation exercises. Biofeedback. This involves using monitors that alert you to changes in your breathing, heart rate, skin temperature, or muscle activity, and using relaxation techniques to reverse those changes. Biofeedback tells you if the techniques you are using are effective. Follow these instructions at home:  Take over-the-counter and prescription medicines only as told by your health care provider. Ask your health care provider if the medicine prescribed to you requires you to avoid driving or using machinery. Do not use any products that contain nicotine or tobacco. These products include cigarettes, chewing tobacco, and vaping devices, such as e-cigarettes. If you need help quitting, ask your health care provider. Join a support group. Express your feelings and talk with someone you trust. Seek counseling or talk therapy with a mental health professional. This may be helpful if you are having trouble managing your emotions about the amputation. Keep all follow-up visits. This is important. Contact a health care provider if: You have a sore on your stump that does not get better with treatment. Your pain does not improve with medicine or treatment. Get help right away if: You have suicidal thoughts. If you ever feel like you may hurt yourself or others, or have thoughts about taking your own life, get help right away. Go to your nearest emergency department or: Call your local emergency services (911 in the [place] Call a suicide crisis helpline, such as the National Suicide Prevention Lifeline at 214-483-3221 or 988 in the [place] This is open 24 hours a day in the [place] Text the Crisis Text Line at 720 160 8697 (in the [place] Summary Phantom limb pain is pain in a body part that no longer exists. It happens in an arm or leg (extremity) after it has been surgically removed (amputated). Medicines or  techniques that help to retrain the brain and nervous system (movement representation techniques) may help to relieve symptoms. Physical therapy for phantom limb pain may involve exercise, massage, heat or cold therapy, or painless stimulation of the skin (transcutaneous electrical nerve stimulation, TENS). This information is not intended to replace advice given to you by your health care provider. Make sure you discuss any questions you have with your health care provider. Document Revised: 11/20/2020 Document Reviewed: 09/12/2020 Elsevier Patient Education  2024 [employer].

## 2023-03-04 NOTE — Progress Notes (Signed)
Virtual Visit Consent   Taylor [name], you are scheduled for a virtual visit with a [facility] provider today. Just as with appointments in the office, your consent must be obtained to participate. Your consent will be active for this visit and any virtual visit you may have with one of our providers in the next 365 days. If you have a MyChart account, a copy of this consent can be sent to you electronically.  As this is a virtual visit, video technology does not allow for your provider to perform a traditional examination. This may limit your provider's ability to fully assess your condition. If your provider identifies any concerns that need to be evaluated in person or the need to arrange testing (such as labs, EKG, etc.), we will make arrangements to do so. Although advances in technology are sophisticated, we cannot ensure that it will always work on either your end or our end. If the connection with a video visit is poor, the visit may have to be switched to a telephone visit. With either a video or telephone visit, we are not always able to ensure that we have a secure connection.  By engaging in this virtual visit, you consent to the provision of healthcare and authorize for your insurance to be billed (if applicable) for the services provided during this visit. Depending on your insurance coverage, you may receive a charge related to this service.  I need to obtain your verbal consent now. Are you willing to proceed with your visit today? DRIANA HUGHETT has provided verbal consent on 03/04/2023 for a virtual visit (video or telephone). Taylor Rodney, FNP  Date: 03/04/2023 12:28 PM  Virtual Visit via Video Note   I, Taylor [name], connected with  Taylor [name]  ([id number], February 24, 1991) on 03/04/23 at 12:10 PM EDT by a video-enabled telemedicine application and verified that I am speaking with the correct person using two identifiers.  Location: Patient: Virtual Visit Location  Patient: Home Provider: Virtual Visit Location Provider: Office   I discussed the limitations of evaluation and management by telemedicine and the availability of in person appointments. The patient expressed understanding and agreed to proceed.    History of Present Illness: Taylor [name] is a 32 y.o. who identifies as a female who was assigned female at birth, and is being seen today for chronic follow up. She is followed by [facility] every 2 months for PTSD, GAD, Depression, and Bipolar. She reports she needs a new office that is closer to home.    She is followed by GI as needed for diarrhea, fecal urgency, and lymphocytic colitis.    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 She completed PT and OT.   HPI: Hypertension This is a chronic problem. The current episode started more than 1 year ago. The problem has been resolved since onset. The problem is controlled. Associated symptoms include anxiety, headaches and peripheral edema. Pertinent negatives include no malaise/fatigue or shortness of breath. Risk factors for coronary artery disease include obesity and sedentary lifestyle. The current treatment provides moderate improvement.  Depression        This is a chronic problem.  The current episode started more than 1 year ago.   Associated symptoms include helplessness, hopelessness, restlessness, headaches and sad.  Past medical history includes anxiety.   Anxiety Presents for follow-up visit. Symptoms include depressed mood, excessive worry, nervous/anxious behavior and restlessness. Patient reports no shortness of breath. Symptoms occur most days. The severity  of symptoms is moderate.    Gastroesophageal Reflux She complains of belching, heartburn and a hoarse voice. This is a chronic problem. The current episode started more than 1 year ago. The problem occurs occasionally. Risk factors include obesity.  Headache  This is a chronic problem. The current episode  started more than 1 year ago. Episode frequency: 3 times a month. Her past medical history is significant for hypertension.    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, right, sequela (HCC) 09/18/2022   S/P BKA (below knee amputation) unilateral, right (HCC) 09/16/2022   HTN (hypertension) 09/09/2022   H/O ankle fusion 03/11/2022   Prolapsed internal hemorrhoids, grade 2 09/16/2021   Lymphocytic colitis 05/16/2021   Incontinence of feces with fecal urgency 11/15/2020   Impingement of right ankle joint    PTSD (post-traumatic stress disorder) 10/04/2020   Migraine without aura and without status migrainosus, not intractable 10/04/2020   Hemorrhoids 01/30/2020   Leukocytosis 09/20/2019   Pain in left foot 09/14/2019   Chronic back pain 08/04/2019   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HCC) 08/04/2019   Lumbar radiculopathy 08/04/2019   IUD (intrauterine device) in place 04/27/2019   Morbid obesity (HCC) 01/23/2019   H/O Abnormal LFTs 10/17/2018   Bicuspid aortic valve    GAD (generalized anxiety disorder) 06/30/2018   GERD (gastroesophageal reflux disease) 06/06/2018   Current smoker 06/06/2018   Depression, major, single episode, moderate (HCC) 06/06/2018    Allergies:  Allergies  Allergen Reactions   Other Itching    Animal Dander   Medications:  Current Outpatient Medications:    acetaminophen (TYLENOL) 325 MG tablet, Take 1-2 tablets (325-650 mg total) by mouth every 4 (four) hours as needed for mild pain., Disp: , Rfl:    buPROPion (WELLBUTRIN  XL) 300 MG 24 hr tablet, Take 1 tablet (300 mg total) by mouth daily., Disp: 90 tablet, Rfl: 1   DULoxetine (CYMBALTA) 60 MG capsule, Take 2 capsules (120 mg total) by mouth daily., Disp: 180 capsule, Rfl: 1   mupirocin ointment (BACTROBAN) 2 %, Apply 1 Application topically 2 (two) times daily., Disp: 22 g, Rfl: 0   pantoprazole (PROTONIX) 40 MG tablet, Take 2 tablets (80 mg total) by mouth daily., Disp: 90 tablet, Rfl: 1   pregabalin (LYRICA) 75 MG capsule, Take 1 capsule (75 mg total) by mouth 2 (two) times daily., Disp: 60 capsule, Rfl: 2   traZODone (DESYREL) 100 MG tablet, Take 1 tablet (100 mg total) by mouth at bedtime., Disp: 90 tablet, Rfl: 1   VRAYLAR 1.5 MG capsule, Take 1 capsule (1.5 mg total) by mouth daily., Disp: 90 capsule, Rfl: 1  Observations/Objective: Patient is well-developed, well-nourished in no acute distress.  Resting comfortably  at home.  Head is normocephalic, atraumatic.  No labored breathing.  Speech is clear and coherent with logical content.  Patient is alert and oriented at baseline.  Crying  Pain in left knee with flexion  BKA right  Assessment and Plan: 1. S/P BKA (below knee amputation) unilateral, right (HCC)  2. PTSD (post-traumatic stress disorder) - Ambulatory  referral to Psychiatry  3. Phantom pain after amputation of lower extremity (HCC)  4. Morbid obesity (HCC)  5. Mood disorder (HCC) - buPROPion (WELLBUTRIN XL) 300 MG 24 hr tablet; Take 1 tablet (300 mg total) by mouth daily.  Dispense: 90 tablet; Refill: 1 - DULoxetine (CYMBALTA) 60 MG capsule; Take 2 capsules (120 mg total) by mouth daily.  Dispense: 180 capsule; Refill: 1 - VRAYLAR 1.5 MG capsule; Take 1 capsule (1.5 mg total) by mouth daily.  Dispense: 90 capsule; Refill: 1 - Ambulatory referral to Psychiatry  6. Migraine without aura and without status migrainosus, not intractable  7. Secondary hypertension  8. Gastroesophageal reflux disease without esophagitis -  pantoprazole (PROTONIX) 40 MG tablet; Take 2 tablets (80 mg total) by mouth daily.  Dispense: 90 tablet; Refill: 1  9. Current smoker  10. Depression, major, single episode, moderate (HCC) - buPROPion (WELLBUTRIN XL) 300 MG 24 hr tablet; Take 1 tablet (300 mg total) by mouth daily.  Dispense: 90 tablet; Refill: 1 - DULoxetine (CYMBALTA) 60 MG capsule; Take 2 capsules (120 mg total) by mouth daily.  Dispense: 180 capsule; Refill: 1 - VRAYLAR 1.5 MG capsule; Take 1 capsule (1.5 mg total) by mouth daily.  Dispense: 90 capsule; Refill: 1 - Ambulatory referral to Psychiatry  11. Bipolar I disorder (HCC) - buPROPion (WELLBUTRIN XL) 300 MG 24 hr tablet; Take 1 tablet (300 mg total) by mouth daily.  Dispense: 90 tablet; Refill: 1 - DULoxetine (CYMBALTA) 60 MG capsule; Take 2 capsules (120 mg total) by mouth daily.  Dispense: 180 capsule; Refill: 1 - VRAYLAR 1.5 MG capsule; Take 1 capsule (1.5 mg total) by mouth daily.  Dispense: 90 capsule; Refill: 1 - Ambulatory referral to Psychiatry  12. GAD (generalized anxiety disorder) - buPROPion (WELLBUTRIN XL) 300 MG 24 hr tablet; Take 1 tablet (300 mg total) by mouth daily.  Dispense: 90 tablet; Refill: 1 - DULoxetine (CYMBALTA) 60 MG capsule; Take 2 capsules (120 mg total) by mouth daily.  Dispense: 180 capsule; Refill: 1 - VRAYLAR 1.5 MG capsule; Take 1 capsule (1.5 mg total) by mouth daily.  Dispense: 90 capsule; Refill: 1 - Ambulatory referral to Psychiatry  13. Insomnia, unspecified type - traZODone (DESYREL) 100 MG tablet; Take 1 tablet (100 mg total) by mouth at bedtime.  Dispense: 90 tablet; Refill: 1 - Ambulatory referral to Psychiatry  Will refill medications  Referral to [facility] pending  Keep specialists follow up  Follow Up Instructions: I discussed the assessment and treatment plan with the patient. The patient was provided an opportunity to ask questions and all were answered. The patient agreed with the plan and demonstrated  an understanding of the instructions.  A copy of instructions were sent to the patient via MyChart unless otherwise noted below.    The patient was advised to call back or seek an in-person evaluation if the symptoms worsen or if the condition fails to improve as anticipated.    Taylor Rodney, FNP

## 2023-03-08 ENCOUNTER — Telehealth: Payer: Self-pay | Admitting: Orthopedic Surgery

## 2023-03-08 ENCOUNTER — Encounter: Payer: Medicare PPO | Attending: Physical Medicine & Rehabilitation | Admitting: Physical Medicine & Rehabilitation

## 2023-03-08 ENCOUNTER — Telehealth: Payer: Self-pay

## 2023-03-08 ENCOUNTER — Encounter: Payer: Self-pay | Admitting: Physical Medicine & Rehabilitation

## 2023-03-08 VITALS — BP 139/92 | HR 99 | Ht 64.0 in

## 2023-03-08 DIAGNOSIS — G5603 Carpal tunnel syndrome, bilateral upper limbs: Secondary | ICD-10-CM | POA: Diagnosis not present

## 2023-03-08 DIAGNOSIS — G546 Phantom limb syndrome with pain: Secondary | ICD-10-CM | POA: Insufficient documentation

## 2023-03-08 DIAGNOSIS — G8929 Other chronic pain: Secondary | ICD-10-CM | POA: Diagnosis not present

## 2023-03-08 DIAGNOSIS — Z89511 Acquired absence of right leg below knee: Secondary | ICD-10-CM | POA: Diagnosis not present

## 2023-03-08 DIAGNOSIS — M25562 Pain in left knee: Secondary | ICD-10-CM | POA: Insufficient documentation

## 2023-03-08 MED ORDER — PREGABALIN 100 MG PO CAPS: 100.0000 mg | ORAL_CAPSULE | Freq: Two times a day (BID) | ORAL | 4 refills | Status: DC

## 2023-03-08 MED ORDER — PREGABALIN 100 MG PO CAPS: 100.0000 mg | ORAL_CAPSULE | Freq: Three times a day (TID) | ORAL | 4 refills | Status: DC
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
## 2023-03-08 NOTE — Telephone Encounter (Signed)
Patient called and ask for an referral to [facility]. CB#[phone]

## 2023-03-08 NOTE — Telephone Encounter (Signed)
Can you please write rx for this.

## 2023-03-08 NOTE — Telephone Encounter (Signed)
Pharmacy needs clarification on lyrica was on 75mg  that was recently picked up, wants to know if the patient is to stop taking this one and resume with the new prescription that was sent in today.

## 2023-03-09 ENCOUNTER — Telehealth: Payer: Self-pay | Admitting: Family

## 2023-03-09 NOTE — Telephone Encounter (Signed)
Patient called in and states she has been unable to get her medication because the pharmacy needs clarification , informed that we received message

## 2023-03-10 NOTE — Telephone Encounter (Signed)
Disregard message. I have spoke with the pharmacy and clarified.
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
## 2023-03-15 ENCOUNTER — Encounter: Payer: Self-pay | Admitting: Physical Medicine and Rehabilitation

## 2023-03-15 ENCOUNTER — Encounter (HOSPITAL_BASED_OUTPATIENT_CLINIC_OR_DEPARTMENT_OTHER): Payer: Medicare PPO | Admitting: Physical Medicine and Rehabilitation

## 2023-03-15 VITALS — BP 116/73 | HR 98 | Temp 98.1°F | Ht 64.0 in

## 2023-03-15 DIAGNOSIS — G8929 Other chronic pain: Secondary | ICD-10-CM | POA: Diagnosis present

## 2023-03-15 DIAGNOSIS — Z89511 Acquired absence of right leg below knee: Secondary | ICD-10-CM | POA: Diagnosis not present

## 2023-03-15 DIAGNOSIS — G5603 Carpal tunnel syndrome, bilateral upper limbs: Secondary | ICD-10-CM | POA: Diagnosis not present

## 2023-03-15 DIAGNOSIS — Z8249 Family history of ischemic heart disease and other diseases of the circulatory system: Secondary | ICD-10-CM | POA: Diagnosis not present

## 2023-03-15 DIAGNOSIS — Z87891 Personal history of nicotine dependence: Secondary | ICD-10-CM | POA: Diagnosis not present

## 2023-03-15 DIAGNOSIS — G4730 Sleep apnea, unspecified: Secondary | ICD-10-CM | POA: Diagnosis present

## 2023-03-15 DIAGNOSIS — M797 Fibromyalgia: Secondary | ICD-10-CM | POA: Diagnosis present

## 2023-03-15 DIAGNOSIS — F4310 Post-traumatic stress disorder, unspecified: Secondary | ICD-10-CM | POA: Diagnosis present

## 2023-03-15 DIAGNOSIS — Z91048 Other nonmedicinal substance allergy status: Secondary | ICD-10-CM | POA: Diagnosis not present

## 2023-03-15 DIAGNOSIS — Z79899 Other long term (current) drug therapy: Secondary | ICD-10-CM | POA: Diagnosis not present

## 2023-03-15 DIAGNOSIS — E876 Hypokalemia: Secondary | ICD-10-CM | POA: Diagnosis not present

## 2023-03-15 DIAGNOSIS — F39 Unspecified mood [affective] disorder: Secondary | ICD-10-CM | POA: Diagnosis not present

## 2023-03-15 DIAGNOSIS — F319 Bipolar disorder, unspecified: Secondary | ICD-10-CM | POA: Diagnosis not present

## 2023-03-15 DIAGNOSIS — Z825 Family history of asthma and other chronic lower respiratory diseases: Secondary | ICD-10-CM | POA: Diagnosis not present

## 2023-03-15 DIAGNOSIS — Z808 Family history of malignant neoplasm of other organs or systems: Secondary | ICD-10-CM | POA: Diagnosis not present

## 2023-03-15 DIAGNOSIS — Y835 Amputation of limb(s) as the cause of abnormal reaction of the patient, or of later complication, without mention of misadventure at the time of the procedure: Secondary | ICD-10-CM | POA: Diagnosis present

## 2023-03-15 DIAGNOSIS — Z7982 Long term (current) use of aspirin: Secondary | ICD-10-CM | POA: Diagnosis not present

## 2023-03-15 DIAGNOSIS — Z9889 Other specified postprocedural states: Secondary | ICD-10-CM | POA: Diagnosis not present

## 2023-03-15 DIAGNOSIS — K219 Gastro-esophageal reflux disease without esophagitis: Secondary | ICD-10-CM | POA: Diagnosis not present

## 2023-03-15 DIAGNOSIS — Z22322 Carrier or suspected carrier of Methicillin resistant Staphylococcus aureus: Secondary | ICD-10-CM | POA: Diagnosis not present

## 2023-03-15 DIAGNOSIS — Z6841 Body Mass Index (BMI) 40.0 and over, adult: Secondary | ICD-10-CM | POA: Diagnosis not present

## 2023-03-15 DIAGNOSIS — E66813 Obesity, class 3: Secondary | ICD-10-CM | POA: Diagnosis not present

## 2023-03-15 DIAGNOSIS — I10 Essential (primary) hypertension: Secondary | ICD-10-CM | POA: Diagnosis not present

## 2023-03-15 DIAGNOSIS — Z8616 Personal history of COVID-19: Secondary | ICD-10-CM | POA: Diagnosis not present

## 2023-03-15 DIAGNOSIS — Z833 Family history of diabetes mellitus: Secondary | ICD-10-CM | POA: Diagnosis not present

## 2023-03-15 DIAGNOSIS — L03115 Cellulitis of right lower limb: Secondary | ICD-10-CM | POA: Diagnosis not present

## 2023-03-15 DIAGNOSIS — G546 Phantom limb syndrome with pain: Secondary | ICD-10-CM | POA: Diagnosis not present

## 2023-03-15 DIAGNOSIS — T8743 Infection of amputation stump, right lower extremity: Secondary | ICD-10-CM | POA: Diagnosis not present

## 2023-03-16 ENCOUNTER — Inpatient Hospital Stay (HOSPITAL_COMMUNITY)
Admission: EM | Admit: 2023-03-16 | Discharge: 2023-03-19 | DRG: 565 | Disposition: A | Discharge status: Home / Self Care | Payer: Medicare PPO | Attending: Internal Medicine | Admitting: Internal Medicine

## 2023-03-16 ENCOUNTER — Other Ambulatory Visit: Payer: Self-pay

## 2023-03-16 DIAGNOSIS — Z89511 Acquired absence of right leg below knee: Secondary | ICD-10-CM

## 2023-03-16 DIAGNOSIS — G4730 Sleep apnea, unspecified: Secondary | ICD-10-CM | POA: Diagnosis present

## 2023-03-16 DIAGNOSIS — Z91048 Other nonmedicinal substance allergy status: Secondary | ICD-10-CM

## 2023-03-16 DIAGNOSIS — T8743 Infection of amputation stump, right lower extremity: Principal | ICD-10-CM | POA: Diagnosis present

## 2023-03-16 DIAGNOSIS — G8929 Other chronic pain: Secondary | ICD-10-CM | POA: Diagnosis present

## 2023-03-16 DIAGNOSIS — Z87891 Personal history of nicotine dependence: Secondary | ICD-10-CM

## 2023-03-16 DIAGNOSIS — G546 Phantom limb syndrome with pain: Secondary | ICD-10-CM | POA: Diagnosis present

## 2023-03-16 DIAGNOSIS — Y835 Amputation of limb(s) as the cause of abnormal reaction of the patient, or of later complication, without mention of misadventure at the time of the procedure: Secondary | ICD-10-CM | POA: Diagnosis present

## 2023-03-16 DIAGNOSIS — M797 Fibromyalgia: Secondary | ICD-10-CM | POA: Diagnosis present

## 2023-03-16 DIAGNOSIS — Z825 Family history of asthma and other chronic lower respiratory diseases: Secondary | ICD-10-CM

## 2023-03-16 DIAGNOSIS — Z6841 Body Mass Index (BMI) 40.0 and over, adult: Secondary | ICD-10-CM

## 2023-03-16 DIAGNOSIS — I10 Essential (primary) hypertension: Secondary | ICD-10-CM | POA: Diagnosis present

## 2023-03-16 DIAGNOSIS — Z8616 Personal history of COVID-19: Secondary | ICD-10-CM

## 2023-03-16 DIAGNOSIS — F4310 Post-traumatic stress disorder, unspecified: Secondary | ICD-10-CM | POA: Diagnosis present

## 2023-03-16 DIAGNOSIS — Z808 Family history of malignant neoplasm of other organs or systems: Secondary | ICD-10-CM

## 2023-03-16 DIAGNOSIS — Z8249 Family history of ischemic heart disease and other diseases of the circulatory system: Secondary | ICD-10-CM

## 2023-03-16 DIAGNOSIS — E876 Hypokalemia: Secondary | ICD-10-CM | POA: Diagnosis present

## 2023-03-16 DIAGNOSIS — K219 Gastro-esophageal reflux disease without esophagitis: Secondary | ICD-10-CM | POA: Diagnosis present

## 2023-03-16 DIAGNOSIS — Z833 Family history of diabetes mellitus: Secondary | ICD-10-CM

## 2023-03-16 DIAGNOSIS — L03115 Cellulitis of right lower limb: Principal | ICD-10-CM | POA: Diagnosis present

## 2023-03-16 DIAGNOSIS — F39 Unspecified mood [affective] disorder: Secondary | ICD-10-CM | POA: Diagnosis present

## 2023-03-16 DIAGNOSIS — Z7982 Long term (current) use of aspirin: Secondary | ICD-10-CM

## 2023-03-16 DIAGNOSIS — F319 Bipolar disorder, unspecified: Secondary | ICD-10-CM | POA: Diagnosis present

## 2023-03-16 DIAGNOSIS — Z22322 Carrier or suspected carrier of Methicillin resistant Staphylococcus aureus: Secondary | ICD-10-CM

## 2023-03-16 DIAGNOSIS — Z79899 Other long term (current) drug therapy: Secondary | ICD-10-CM

## 2023-03-16 DIAGNOSIS — E66813 Obesity, class 3: Secondary | ICD-10-CM | POA: Diagnosis present

## 2023-03-16 LAB — CBC WITH DIFFERENTIAL/PLATELET
Abs Immature Granulocytes: 0.02 10*3/uL (ref 0.00–0.07)
Basophils Absolute: 0 10*3/uL (ref 0.0–0.1)
Basophils Relative: 0 %
Eosinophils Absolute: 0.2 10*3/uL (ref 0.0–0.5)
Eosinophils Relative: 3 %
HCT: 37 % (ref 36.0–46.0)
Hemoglobin: 12.3 g/dL (ref 12.0–15.0)
Immature Granulocytes: 0 %
Lymphocytes Relative: 23 %
Lymphs Abs: 2 10*3/uL (ref 0.7–4.0)
MCH: 31.1 pg (ref 26.0–34.0)
MCHC: 33.2 g/dL (ref 30.0–36.0)
MCV: 93.4 fL (ref 80.0–100.0)
Monocytes Absolute: 0.4 10*3/uL (ref 0.1–1.0)
Monocytes Relative: 5 %
Neutro Abs: 6.1 10*3/uL (ref 1.7–7.7)
Neutrophils Relative %: 69 %
Platelets: 247 10*3/uL (ref 150–400)
RBC: 3.96 MIL/uL (ref 3.87–5.11)
RDW: 12.9 % (ref 11.5–15.5)
WBC: 8.8 10*3/uL (ref 4.0–10.5)
nRBC: 0 % (ref 0.0–0.2)

## 2023-03-16 LAB — COMPREHENSIVE METABOLIC PANEL
ALT: 24 U/L (ref 0–44)
AST: 15 U/L (ref 15–41)
Albumin: 3.9 g/dL (ref 3.5–5.0)
Alkaline Phosphatase: 69 U/L (ref 38–126)
Anion gap: 6 (ref 5–15)
BUN: 17 mg/dL (ref 6–20)
CO2: 27 mmol/L (ref 22–32)
Calcium: 9.1 mg/dL (ref 8.9–10.3)
Chloride: 103 mmol/L (ref 98–111)
Creatinine, Ser: 0.57 mg/dL (ref 0.44–1.00)
GFR, Estimated: >60 mL/min (ref >60)
Glucose, Bld: 96 mg/dL (ref 70–99)
Potassium: 3.6 mmol/L (ref 3.5–5.1)
Sodium: 136 mmol/L (ref 135–145)
Total Bilirubin: 0.3 mg/dL (ref <1.2)
Total Protein: 7.2 g/dL (ref 6.5–8.1)

## 2023-03-16 LAB — LACTIC ACID, PLASMA: Lactic Acid, Venous: 0.6 mmol/L (ref 0.5–1.9)

## 2023-03-16 MED ORDER — SODIUM CHLORIDE 0.9 % IV BOLUS
1000.0000 mL | Freq: Once | INTRAVENOUS | Status: AC
  Administered 2023-03-17: 1000 mL via INTRAVENOUS

## 2023-03-16 MED ORDER — VANCOMYCIN HCL 2000 MG/400ML IV SOLN
2000.0000 mg | Freq: Once | INTRAVENOUS | Status: AC
  Administered 2023-03-17: 2000 mg via INTRAVENOUS
  Filled 2023-03-16: qty 400

## 2023-03-16 MED ORDER — SODIUM CHLORIDE 0.9 % IV SOLN
2.0000 g | Freq: Once | INTRAVENOUS | Status: AC
  Administered 2023-03-17: 2 g via INTRAVENOUS
  Filled 2023-03-16: qty 20

## 2023-03-16 NOTE — ED Triage Notes (Signed)
Pt has left BKA, had a surgical site infection in June. Pt states she now has "blisters" and swelling to right stump. Redness noted up to pts thigh. C/o weakness, fatigue and nausea.

## 2023-03-16 NOTE — ED Provider Notes (Signed)
[place] EMERGENCY DEPARTMENT AT [facility] Provider Note   CSN: [id number] Arrival date & time: 03/16/23  1951     History  Chief Complaint  Patient presents with   Wound Infection    Taylor [name] is a 32 y.o. female.  Patient presents to the emergency department for evaluation of pain, swelling, blistering of her right lower leg.  Patient has had prior BKA with wound dehiscence and recurrent infections.       Home Medications Prior to Admission medications   Medication Sig Start Date End Date Taking? Authorizing Provider  acetaminophen (TYLENOL) 325 MG tablet Take 1-2 tablets (325-650 mg total) by mouth every 4 (four) hours as needed for mild pain. 10/27/22   Setzer, Lynnell Jude, PA-C  buPROPion (WELLBUTRIN XL) 300 MG 24 hr tablet Take 1 tablet (300 mg total) by mouth daily. 03/04/23   [name], FNP  DULoxetine (CYMBALTA) 60 MG capsule Take 2 capsules (120 mg total) by mouth daily. 03/04/23   [name], FNP  mupirocin ointment (BACTROBAN) 2 % Apply 1 Application topically 2 (two) times daily. 02/19/23   Adonis Huguenin, NP  pantoprazole (PROTONIX) 40 MG tablet Take 2 tablets (80 mg total) by mouth daily. 03/04/23   [name], FNP  pregabalin (LYRICA) 100 MG capsule Take 1 capsule (100 mg total) by mouth 3 (three) times daily. Start with 1 capsule twice a day for 3, if tolerating and pain is not controlled can increase to three times a day 03/08/23   Fanny Dance, MD  traZODone (DESYREL) 100 MG tablet Take 1 tablet (100 mg total) by mouth at bedtime. 03/04/23   Hawks, Neysa Bonito A, FNP  VRAYLAR 1.5 MG capsule Take 1 capsule (1.5 mg total) by mouth daily. 03/04/23   [name], FNP      Allergies    Other    Review of Systems   Review of Systems  Physical Exam Updated Vital Signs BP (!) 124/95 (BP Location: Right Arm)   Pulse (!) 116   Temp 98.2 F (36.8 C) (Oral)   Resp 17   SpO2 97%  Physical Exam Vitals and nursing note  reviewed.  Constitutional:      General: She is not in acute distress.    Appearance: She is well-developed.  HENT:     Head: Normocephalic and atraumatic.     Mouth/Throat:     Mouth: Mucous membranes are moist.  Eyes:     General: Vision grossly intact. Gaze aligned appropriately.     Extraocular Movements: Extraocular movements intact.     Conjunctiva/sclera: Conjunctivae normal.  Cardiovascular:     Rate and Rhythm: Regular rhythm. Tachycardia present.     Pulses: Normal pulses.     Heart sounds: Normal heart sounds, S1 normal and S2 normal. No murmur heard.    No friction rub. No gallop.  Pulmonary:     Effort: Pulmonary effort is normal. No respiratory distress.     Breath sounds: Normal breath sounds.  Abdominal:     General: Bowel sounds are normal.     Palpations: Abdomen is soft.     Tenderness: There is no abdominal tenderness. There is no guarding or rebound.     Hernia: No hernia is present.  Musculoskeletal:        General: No swelling.     Cervical back: Full passive range of motion without pain, normal range of motion and neck supple. No spinous process tenderness or muscular  tenderness. Normal range of motion.     Right lower leg: No edema.     Left lower leg: No edema.  Skin:    General: Skin is warm and dry.     Capillary Refill: Capillary refill takes less than 2 seconds.     Findings: Erythema (Right stump up to the thigh) present. No ecchymosis, rash or wound.  Neurological:     General: No focal deficit present.     Mental Status: She is alert and oriented to person, place, and time.     GCS: GCS eye subscore is 4. GCS verbal subscore is 5. GCS motor subscore is 6.     Cranial Nerves: Cranial nerves 2-12 are intact.     Sensory: Sensation is intact.     Motor: Motor function is intact.     Coordination: Coordination is intact.  Psychiatric:        Attention and Perception: Attention normal.        Mood and Affect: Mood normal.        Speech: Speech  normal.        Behavior: Behavior normal.        ED Results / Procedures / Treatments   Labs (all labs ordered are listed, but only abnormal results are displayed) Labs Reviewed  LACTIC ACID, PLASMA  COMPREHENSIVE METABOLIC PANEL  CBC WITH DIFFERENTIAL/PLATELET  URINALYSIS, W/ REFLEX TO CULTURE (INFECTION SUSPECTED)  POC URINE PREG, ED    EKG None  Radiology No results found.  Procedures Procedures    Medications Ordered in ED Medications  cefTRIAXone (ROCEPHIN) 2 g in sodium chloride 0.9 % 100 mL IVPB (has no administration in time range)    ED Course/ Medical Decision Making/ A&P                                 Medical Decision Making Amount and/or Complexity of Data Reviewed Labs: ordered.   Patient with prior BKA who has had prolonged problematic postoperative course including wound dehiscence and recurrent infections.  Patient with pain, swelling, redness of the stump that goes all the way up to the thigh.  There is some skin breakdown and blistering of the stump.  No obvious drainable abscess.        Final Clinical Impression(s) / ED Diagnoses Final diagnoses:  Cellulitis of right lower extremity    Rx / DC Orders ED Discharge Orders     None         Gilda Crease, MD 03/16/23 2345

## 2023-03-17 ENCOUNTER — Encounter (HOSPITAL_COMMUNITY): Payer: Self-pay | Admitting: Internal Medicine

## 2023-03-17 ENCOUNTER — Emergency Department (HOSPITAL_COMMUNITY): Payer: Medicare PPO

## 2023-03-17 DIAGNOSIS — Z8249 Family history of ischemic heart disease and other diseases of the circulatory system: Secondary | ICD-10-CM | POA: Diagnosis not present

## 2023-03-17 DIAGNOSIS — I10 Essential (primary) hypertension: Secondary | ICD-10-CM | POA: Diagnosis not present

## 2023-03-17 DIAGNOSIS — G4730 Sleep apnea, unspecified: Secondary | ICD-10-CM | POA: Diagnosis present

## 2023-03-17 DIAGNOSIS — Z91048 Other nonmedicinal substance allergy status: Secondary | ICD-10-CM | POA: Diagnosis not present

## 2023-03-17 DIAGNOSIS — M797 Fibromyalgia: Secondary | ICD-10-CM | POA: Diagnosis present

## 2023-03-17 DIAGNOSIS — Z79899 Other long term (current) drug therapy: Secondary | ICD-10-CM | POA: Diagnosis not present

## 2023-03-17 DIAGNOSIS — Z833 Family history of diabetes mellitus: Secondary | ICD-10-CM | POA: Diagnosis not present

## 2023-03-17 DIAGNOSIS — Z89511 Acquired absence of right leg below knee: Secondary | ICD-10-CM | POA: Diagnosis not present

## 2023-03-17 DIAGNOSIS — Z808 Family history of malignant neoplasm of other organs or systems: Secondary | ICD-10-CM | POA: Diagnosis not present

## 2023-03-17 DIAGNOSIS — Z7982 Long term (current) use of aspirin: Secondary | ICD-10-CM | POA: Diagnosis not present

## 2023-03-17 DIAGNOSIS — E66813 Obesity, class 3: Secondary | ICD-10-CM | POA: Diagnosis present

## 2023-03-17 DIAGNOSIS — E876 Hypokalemia: Secondary | ICD-10-CM | POA: Diagnosis not present

## 2023-03-17 DIAGNOSIS — G8929 Other chronic pain: Secondary | ICD-10-CM | POA: Diagnosis present

## 2023-03-17 DIAGNOSIS — F39 Unspecified mood [affective] disorder: Secondary | ICD-10-CM | POA: Diagnosis not present

## 2023-03-17 DIAGNOSIS — L03115 Cellulitis of right lower limb: Principal | ICD-10-CM | POA: Diagnosis present

## 2023-03-17 DIAGNOSIS — Z8616 Personal history of COVID-19: Secondary | ICD-10-CM | POA: Diagnosis not present

## 2023-03-17 DIAGNOSIS — Z22322 Carrier or suspected carrier of Methicillin resistant Staphylococcus aureus: Secondary | ICD-10-CM | POA: Diagnosis not present

## 2023-03-17 DIAGNOSIS — Y835 Amputation of limb(s) as the cause of abnormal reaction of the patient, or of later complication, without mention of misadventure at the time of the procedure: Secondary | ICD-10-CM | POA: Diagnosis present

## 2023-03-17 DIAGNOSIS — Z87891 Personal history of nicotine dependence: Secondary | ICD-10-CM | POA: Diagnosis not present

## 2023-03-17 DIAGNOSIS — K219 Gastro-esophageal reflux disease without esophagitis: Secondary | ICD-10-CM | POA: Diagnosis not present

## 2023-03-17 DIAGNOSIS — G546 Phantom limb syndrome with pain: Secondary | ICD-10-CM | POA: Diagnosis present

## 2023-03-17 DIAGNOSIS — F4310 Post-traumatic stress disorder, unspecified: Secondary | ICD-10-CM | POA: Diagnosis present

## 2023-03-17 DIAGNOSIS — Z9889 Other specified postprocedural states: Secondary | ICD-10-CM | POA: Diagnosis not present

## 2023-03-17 DIAGNOSIS — Z6841 Body Mass Index (BMI) 40.0 and over, adult: Secondary | ICD-10-CM | POA: Diagnosis not present

## 2023-03-17 DIAGNOSIS — T8743 Infection of amputation stump, right lower extremity: Secondary | ICD-10-CM | POA: Diagnosis present

## 2023-03-17 DIAGNOSIS — Z825 Family history of asthma and other chronic lower respiratory diseases: Secondary | ICD-10-CM | POA: Diagnosis not present

## 2023-03-17 DIAGNOSIS — F319 Bipolar disorder, unspecified: Secondary | ICD-10-CM | POA: Diagnosis present

## 2023-03-17 LAB — CBC
HCT: 33.8 % — ABNORMAL LOW (ref 36.0–46.0)
Hemoglobin: 11 g/dL — ABNORMAL LOW (ref 12.0–15.0)
MCH: 31.3 pg (ref 26.0–34.0)
MCHC: 32.5 g/dL (ref 30.0–36.0)
MCV: 96.3 fL (ref 80.0–100.0)
Platelets: 216 10*3/uL (ref 150–400)
RBC: 3.51 MIL/uL — ABNORMAL LOW (ref 3.87–5.11)
RDW: 13.1 % (ref 11.5–15.5)
WBC: 7 10*3/uL (ref 4.0–10.5)
nRBC: 0 % (ref 0.0–0.2)

## 2023-03-17 LAB — URINALYSIS, W/ REFLEX TO CULTURE (INFECTION SUSPECTED)
Bacteria, UA: NONE SEEN
Bilirubin Urine: NEGATIVE
Glucose, UA: NEGATIVE mg/dL
Hgb urine dipstick: NEGATIVE
Ketones, ur: NEGATIVE mg/dL
Nitrite: NEGATIVE
Protein, ur: NEGATIVE mg/dL
Specific Gravity, Urine: 1.016 (ref 1.005–1.030)
pH: 5 (ref 5.0–8.0)

## 2023-03-17 LAB — COMPREHENSIVE METABOLIC PANEL
ALT: 22 U/L (ref 0–44)
AST: 17 U/L (ref 15–41)
Albumin: 3.5 g/dL (ref 3.5–5.0)
Alkaline Phosphatase: 61 U/L (ref 38–126)
Anion gap: 7 (ref 5–15)
BUN: 13 mg/dL (ref 6–20)
CO2: 29 mmol/L (ref 22–32)
Calcium: 8.6 mg/dL — ABNORMAL LOW (ref 8.9–10.3)
Chloride: 101 mmol/L (ref 98–111)
Creatinine, Ser: 0.58 mg/dL (ref 0.44–1.00)
GFR, Estimated: >60 mL/min (ref >60)
Glucose, Bld: 96 mg/dL (ref 70–99)
Potassium: 3.4 mmol/L — ABNORMAL LOW (ref 3.5–5.1)
Sodium: 137 mmol/L (ref 135–145)
Total Bilirubin: 0.5 mg/dL (ref <1.2)
Total Protein: 6.6 g/dL (ref 6.5–8.1)

## 2023-03-17 LAB — PHOSPHORUS: Phosphorus: 3.4 mg/dL (ref 2.5–4.6)

## 2023-03-17 LAB — MAGNESIUM: Magnesium: 1.9 mg/dL (ref 1.7–2.4)

## 2023-03-17 LAB — MRSA NEXT GEN BY PCR, NASAL: MRSA by PCR Next Gen: DETECTED — AB

## 2023-03-17 LAB — GLUCOSE, CAPILLARY: Glucose-Capillary: 256 mg/dL — ABNORMAL HIGH (ref 70–99)

## 2023-03-17 LAB — PREGNANCY, URINE: Preg Test, Ur: NEGATIVE

## 2023-03-17 MED ORDER — SODIUM CHLORIDE 0.9 % IV SOLN
2.0000 g | INTRAVENOUS | Status: DC
  Administered 2023-03-17 – 2023-03-18 (×2): 2 g via INTRAVENOUS
  Filled 2023-03-17 (×2): qty 20

## 2023-03-17 MED ORDER — SODIUM CHLORIDE 0.9 % IV SOLN: 2.0000 g | INTRAVENOUS | Status: DC

## 2023-03-17 MED ORDER — ENOXAPARIN SODIUM 40 MG/0.4ML IJ SOSY
40.0000 mg | PREFILLED_SYRINGE | INTRAMUSCULAR | Status: DC
  Administered 2023-03-17 – 2023-03-18 (×2): 40 mg via SUBCUTANEOUS
  Filled 2023-03-17 (×2): qty 0.4

## 2023-03-17 MED ORDER — OXYCODONE HCL 5 MG PO TABS
5.0000 mg | ORAL_TABLET | Freq: Four times a day (QID) | ORAL | Status: DC | PRN
  Administered 2023-03-17 – 2023-03-19 (×7): 5 mg via ORAL
  Filled 2023-03-17 (×7): qty 1

## 2023-03-17 MED ORDER — BUPROPION HCL ER (XL) 300 MG PO TB24
300.0000 mg | ORAL_TABLET | Freq: Every day | ORAL | Status: DC
  Administered 2023-03-17 – 2023-03-19 (×3): 300 mg via ORAL
  Filled 2023-03-17 (×3): qty 1

## 2023-03-17 MED ORDER — ORAL CARE MOUTH RINSE: 15.0000 mL | OROMUCOSAL | Status: DC | PRN

## 2023-03-17 MED ORDER — PREGABALIN 50 MG PO CAPS
100.0000 mg | ORAL_CAPSULE | Freq: Two times a day (BID) | ORAL | Status: DC
  Administered 2023-03-17 – 2023-03-18 (×2): 100 mg via ORAL
  Filled 2023-03-17 (×2): qty 2

## 2023-03-17 MED ORDER — TRAZODONE HCL 50 MG PO TABS
100.0000 mg | ORAL_TABLET | Freq: Every evening | ORAL | Status: DC | PRN
  Administered 2023-03-17 – 2023-03-18 (×2): 100 mg via ORAL
  Filled 2023-03-17 (×2): qty 2

## 2023-03-17 MED ORDER — CHLORHEXIDINE GLUCONATE CLOTH 2 % EX PADS
6.0000 | MEDICATED_PAD | Freq: Every day | CUTANEOUS | Status: DC
  Administered 2023-03-18 – 2023-03-19 (×2): 6 via TOPICAL

## 2023-03-17 MED ORDER — DOXYCYCLINE HYCLATE 100 MG PO TABS
100.0000 mg | ORAL_TABLET | Freq: Two times a day (BID) | ORAL | Status: DC
  Administered 2023-03-17 – 2023-03-19 (×5): 100 mg via ORAL
  Filled 2023-03-17 (×5): qty 1

## 2023-03-17 MED ORDER — DULOXETINE HCL 60 MG PO CPEP
120.0000 mg | ORAL_CAPSULE | Freq: Every day | ORAL | Status: DC
  Administered 2023-03-17 – 2023-03-19 (×3): 120 mg via ORAL
  Filled 2023-03-17 (×3): qty 2

## 2023-03-17 MED ORDER — PANTOPRAZOLE SODIUM 40 MG PO TBEC
80.0000 mg | DELAYED_RELEASE_TABLET | Freq: Every day | ORAL | Status: DC
  Administered 2023-03-17 – 2023-03-19 (×3): 80 mg via ORAL
  Filled 2023-03-17 (×3): qty 2

## 2023-03-17 MED ORDER — MUPIROCIN 2 % EX OINT
1.0000 | TOPICAL_OINTMENT | Freq: Two times a day (BID) | CUTANEOUS | Status: DC
  Administered 2023-03-17 – 2023-03-19 (×5): 1 via NASAL
  Filled 2023-03-17: qty 22

## 2023-03-17 MED ORDER — CARIPRAZINE HCL 1.5 MG PO CAPS
1.5000 mg | ORAL_CAPSULE | Freq: Every day | ORAL | Status: DC
  Administered 2023-03-17 – 2023-03-19 (×3): 1.5 mg via ORAL
  Filled 2023-03-17 (×6): qty 1
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
## 2023-03-17 NOTE — Progress Notes (Signed)
   03/17/23 1213  TOC Brief Assessment  Insurance and Status Reviewed  Patient has primary care physician Yes  Home environment has been reviewed Home with family  Prior level of function: Independent  Prior/Current Home Services No current home services  Social Determinants of Health Reivew SDOH reviewed no interventions necessary  Readmission risk has been reviewed Yes  Transition of care needs no transition of care needs at this time   Transition of Care Department [facility]) has reviewed patient and no TOC needs have been identified at this time. We will continue to monitor patient advancement through interdisciplinary progression rounds. If new patient transition needs arise, please place a TOC consult.

## 2023-03-17 NOTE — ED Notes (Signed)
In to put pt in w/c and pt groggy, has been very alert. Pt states she is sleepy. Receiving floor nurse aware, Alphonzo Lemmings

## 2023-03-17 NOTE — Progress Notes (Signed)
Patient seen and examined; admitted after midnight secondary to right lower extremity swelling, pain, redness and blistering.  Patient symptoms have been present for the last 24-48 hours prior to admission.  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  Continue treatment with IV ceftriaxone and oral doxycycline.  Patient received 1 dose of vancomycin in the ED.  Afebrile and with normal WBCs level; continue to keep right lower extremity elevated as much as possible and continue as needed analgesics.  Please refer to H&P written by Dr. Thomes Dinning on 03/17/2023 for further info/details on admission.  Plan: -continue IV antibiotics -continue PRN analgesics -follow clinical response.  Vassie Loll MD [phone]

## 2023-03-17 NOTE — Progress Notes (Signed)
ED Pharmacy Antibiotic Sign Off An antibiotic consult was received from an ED provider for Vancomycin  per pharmacy dosing for cellulitis. A chart review was completed to assess appropriateness.   The following one time order(s) were placed:  Vancomycin 2000 mg IV  Further antibiotic and/or antibiotic pharmacy consults should be ordered by the admitting provider if indicated.   Thank you for allowing pharmacy to be a part of this patient's care.   Eddie Candle, [facility]  Clinical Pharmacist 03/17/23 3:24 AM

## 2023-03-17 NOTE — ED Notes (Signed)
Pt sitting up watching tv. A/o. Nad. Redness noted to right stump. Pt states she has not gotten anything for pain all night. Message sent to [facility]. Aware awaiting admission bed.

## 2023-03-18 DIAGNOSIS — I10 Essential (primary) hypertension: Secondary | ICD-10-CM | POA: Diagnosis not present

## 2023-03-18 DIAGNOSIS — K219 Gastro-esophageal reflux disease without esophagitis: Secondary | ICD-10-CM | POA: Diagnosis not present

## 2023-03-18 DIAGNOSIS — F39 Unspecified mood [affective] disorder: Secondary | ICD-10-CM | POA: Diagnosis not present

## 2023-03-18 DIAGNOSIS — E876 Hypokalemia: Secondary | ICD-10-CM

## 2023-03-18 DIAGNOSIS — L03115 Cellulitis of right lower limb: Secondary | ICD-10-CM | POA: Diagnosis not present

## 2023-03-18 LAB — URINE CULTURE: Culture: <10000 — AB

## 2023-03-18 LAB — GLUCOSE, CAPILLARY: Glucose-Capillary: 145 mg/dL — ABNORMAL HIGH (ref 70–99)

## 2023-03-18 MED ORDER — POTASSIUM CHLORIDE CRYS ER 20 MEQ PO TBCR
40.0000 meq | EXTENDED_RELEASE_TABLET | Freq: Once | ORAL | Status: AC
  Administered 2023-03-18: 40 meq via ORAL
  Filled 2023-03-18: qty 2

## 2023-03-18 MED ORDER — PREGABALIN 50 MG PO CAPS
100.0000 mg | ORAL_CAPSULE | Freq: Three times a day (TID) | ORAL | Status: DC
  Administered 2023-03-18 – 2023-03-19 (×2): 100 mg via ORAL
  Filled 2023-03-18 (×2): qty 2

## 2023-03-18 NOTE — Progress Notes (Addendum)
Patient without acute distress this shift. Medicated once for pain. Right stump remains red and warm. Continues on antibiotics. Will continue to monitor.   Patient complained f pain at IV site. Redness and swelling noted. IV removed. Patient wanted to wait until later to have another IV placed since she did not have any IV medication ordered until then.

## 2023-03-18 NOTE — Plan of Care (Signed)

## 2023-03-18 NOTE — Plan of Care (Signed)
Will continue to monitor according to care plan and orders.   Problem: Education: Goal: Knowledge of General Education information will improve Description: Including pain rating scale, medication(s)/side effects and non-pharmacologic comfort measures Outcome: Progressing   Problem: Health Behavior/Discharge Planning: Goal: Ability to manage health-related needs will improve Outcome: Progressing   Problem: Clinical Measurements: Goal: Ability to maintain clinical measurements within normal limits will improve Outcome: Progressing Goal: Will remain free from infection Outcome: Progressing Goal: Diagnostic test results will improve Outcome: Progressing Goal: Respiratory complications will improve Outcome: Progressing Goal: Cardiovascular complication will be avoided Outcome: Progressing   Problem: Activity: Goal: Risk for activity intolerance will decrease Outcome: Progressing   Problem: Nutrition: Goal: Adequate nutrition will be maintained Outcome: Progressing   Problem: Coping: Goal: Level of anxiety will decrease Outcome: Progressing   Problem: Elimination: Goal: Will not experience complications related to bowel motility Outcome: Progressing Goal: Will not experience complications related to urinary retention Outcome: Progressing   Problem: Pain Management: Goal: General experience of comfort will improve Outcome: Progressing   Problem: Safety: Goal: Ability to remain free from injury will improve Outcome: Progressing   Problem: Skin Integrity: Goal: Risk for impaired skin integrity will decrease Outcome: Progressing

## 2023-03-18 NOTE — Progress Notes (Signed)
Patient calm & cooperative with care. R stump / cellulitis infection / blistering continues. New IV placed for IV ABX tonight. Pain controlled with PO oxy. Pt tolerating diet & moving independently in the bed. Possible discharge tomorrow.

## 2023-03-18 NOTE — Progress Notes (Signed)
Progress Note   Patient: Taylor [name] ZOX:[id number] DOB: 01-May-1991 DOA: 03/16/2023     1 DOS: the patient was seen and examined on 03/18/2023   Brief hospital admission course: As per H&P written by Dr. Thomes Dinning on 03/17/2023 Taylor [name] is a 32 y.o. 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  She denies fever, chest pain, shortness of breath, nausea or vomiting.   ED Course:  In the emergency department, BP was 109/57, but other vital signs were within normal range.  Workup in the ED showed normal CBC and BMP.  Lactic acid two 0.6, pregnancy test was negative, urinalysis was normal. X-ray of right tibia and fibula showed status post below-knee amputation with no radiographic evidence of osteomyelitis Patient was treated with IV ceftriaxone and vancomycin.  IV NS 1 L was given. Hospitalist was asked to admit patient for further evaluation and management.   Assessment and Plan: 1-right lower extremity cellulitis -Patient overall cellulitic process improving and on today's examination demonstrating less erythema and flattening of initial appreciated blistering process. -Please refer to epic media for pictures of today's skin evaluation. -Continue current antibiotics; patient instructed to keep leg elevated -Anticipating discharge home on oral antibiotics with outpatient follow-up by Dr. Lajoyce Corners. -Continue as needed analgesics.  2-essential hypertension -Continue to follow vital signs -Blood pressure still soft -Low sodium/heart healthy diet discussed with patient. -Continue to hold antihypertensive agents at the moment.  3-gastroesophageal flux disease -Continue Protonix.  4-underlying mood disorder -Stable overall. -Continue home bupropion, duloxetine and  Vraylar.  5-obesity -class 3 -with prior BMI around 40 -Low-calorie diet and portion control discussed with patient.  6-neuropathy/phantom pain and chronic back pain -Continue as needed analgesics and the use of Lyrica. -Continue outpatient follow-up with PCP/pain management clinic if needed, to further adjust medication as required.  7-hypokalemia -Repleted. -Follow electrolytes trend.   Subjective:  No fever, no nausea, no vomiting.  Reports feeling better, but is still with some pain in her right leg and noticing some erythematous changes (patient recognizes noticing improvement and less swelling)..  Physical Exam: Vitals:   03/18/23 0521 03/18/23 0525 03/18/23 0741 03/18/23 1329  BP: (!) 81/47 103/61 107/65 (!) 106/54  Pulse: 67   82  Resp: 16  14 18   Temp: 98 F (36.7 C)   98.6 F (37 C)  TempSrc: Oral   Oral  SpO2: 95%  95% 96%   General exam: Alert, awake, oriented x 3; in no acute distress.  Afebrile. Respiratory system: Clear to auscultation. Respiratory effort normal.  Good saturation on room air. Cardiovascular system:RRR. No murmurs, rubs, gallops. Gastrointestinal system: Abdomen is obese, nondistended, soft and nontender. No organomegaly or masses felt. Normal bowel sounds heard. Central nervous system: Alert and oriented. No focal neurological deficits. Extremities: No cyanosis or clubbing; left lower extremity with chronic 1+ edema appreciated.  Right BKA.  Less erythema and per patient description less pain on today's evaluation.  Refer to epic media for visualization changes. Skin: No petechiae. Psychiatry: Judgement and insight appear normal. Mood & affect appropriate.   Data Reviewed: Complete metabolic panel: Sodium 137, potassium 3.4, chloride 101, bicarb 29, glucose 96, BUN 13, creatinine 0.58 and normal LFTs. CBC: WBC 7.0, hemoglobin 11.0 and platelet count 216K. Magnesium: 1.9.   Family Communication: No family at bedside.  Disposition:  Status  is: Inpatient Remains inpatient appropriate because: Continue IV antibiotics.   Planned Discharge Destination: Home    Time spent: 40 minutes  Author: Vassie Loll, MD 03/18/2023 6:35 PM  For on call review [email or web address].

## 2023-03-19 ENCOUNTER — Encounter: Payer: Self-pay | Admitting: Physical Medicine and Rehabilitation

## 2023-03-19 DIAGNOSIS — I10 Essential (primary) hypertension: Secondary | ICD-10-CM | POA: Diagnosis not present

## 2023-03-19 DIAGNOSIS — K219 Gastro-esophageal reflux disease without esophagitis: Secondary | ICD-10-CM | POA: Diagnosis not present

## 2023-03-19 DIAGNOSIS — F39 Unspecified mood [affective] disorder: Secondary | ICD-10-CM | POA: Diagnosis not present

## 2023-03-19 DIAGNOSIS — L03115 Cellulitis of right lower limb: Secondary | ICD-10-CM | POA: Diagnosis not present

## 2023-03-19 LAB — BASIC METABOLIC PANEL
Anion gap: 6 (ref 5–15)
BUN: 11 mg/dL (ref 6–20)
CO2: 25 mmol/L (ref 22–32)
Calcium: 8.8 mg/dL — ABNORMAL LOW (ref 8.9–10.3)
Chloride: 107 mmol/L (ref 98–111)
Creatinine, Ser: 0.6 mg/dL (ref 0.44–1.00)
GFR, Estimated: >60 mL/min (ref >60)
Glucose, Bld: 95 mg/dL (ref 70–99)
Potassium: 3.7 mmol/L (ref 3.5–5.1)
Sodium: 138 mmol/L (ref 135–145)

## 2023-03-19 MED ORDER — OXYCODONE HCL 5 MG PO TABS: 5.0000 mg | ORAL_TABLET | Freq: Four times a day (QID) | ORAL | 0 refills | Status: DC | PRN

## 2023-03-19 MED ORDER — DOXYCYCLINE HYCLATE 100 MG PO TABS: 100.0000 mg | ORAL_TABLET | Freq: Two times a day (BID) | ORAL | 0 refills | Status: AC

## 2023-03-19 MED ORDER — CEPHALEXIN 500 MG PO CAPS: 500.0000 mg | ORAL_CAPSULE | Freq: Three times a day (TID) | ORAL | 0 refills | Status: AC

## 2023-03-19 NOTE — Care Management Important Message (Signed)
Important Message  Patient Details  Name: Taylor [name] MRN: [id number] Date of Birth: 08-15-1990   Important Message Given:  Yes - Medicare IM     Corey Harold 03/19/2023, 10:35 AM

## 2023-03-19 NOTE — Discharge Summary (Signed)
Physician Discharge Summary   Patient: Taylor [name] MRN: [id number] DOB: 1991/04/20  Admit date:     03/16/2023  Discharge date: 03/19/23  Discharge Physician: Vassie Loll   PCP: [name], FNP   Recommendations at discharge:  Repeat CBC to follow white blood cells/hemoglobin stability. Repeat basic metabolic panel to follow electrolytes and renal function Reassess blood pressure and adjust antihypertensive regimen as required. Continue treating patient with weight loss management.  Discharge Diagnoses: Principal Problem:   Cellulitis of right lower extremity Active Problems:   GERD without esophagitis   Essential hypertension   Mood disorder [facility])  Brief hospital admission course: As per H&P written by Dr. Thomes Dinning on 03/17/2023 Taylor [name] is a 32 y.o. 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  She denies fever, chest pain, shortness of breath, nausea or vomiting.   ED Course:  In the emergency department, BP was 109/57, but other vital signs were within normal range.  Workup in the ED showed normal CBC and BMP.  Lactic acid two 0.6, pregnancy test was negative, urinalysis was normal. X-ray of right tibia and fibula showed status post below-knee amputation with no radiographic evidence of osteomyelitis Patient was treated with IV ceftriaxone and vancomycin.  IV NS 1 L was given. Hospitalist was asked to admit patient for further evaluation and management.  Assessment and Plan: 1-right lower extremity cellulitis -Patient overall cellulitic process improving and on today's examination demonstrating less erythema and f and no active drainage. -Please refer to epic media for pictures of today's skin evaluation. -Patient has been discharge on  oral Keflex and doxycycline. -outpatient follow-up by Dr. Lajoyce Corners in 1 week. -Continue as needed analgesics.   2-essential hypertension -Continue to follow diet control -Reassess blood pressure as an outpatient and determine need for antihypertensive agent.   3-gastroesophageal flux disease -Continue Protonix.   4-underlying mood disorder -Stable overall. -Continue home bupropion, duloxetine and Vraylar.   5-obesity -class 3 -with prior BMI around 40 -Low-calorie diet and portion control discussed with patient.   6-neuropathy/phantom pain and chronic back pain -Continue as needed analgesics and the use of Lyrica. -Continue outpatient follow-up with PCP/pain management clinic if needed, to further adjust medication as required.   7-hypokalemia -Repleted. -Repeat basic metabolic panel follow-up visit to follow electrolytes trend.   Consultants: None Procedures performed: See below for x-ray reports. Disposition: Home Diet recommendation: Heart healthy/low-sodium and low calorie diet.  DISCHARGE MEDICATION: Allergies as of 03/19/2023       Reactions   Other Itching   Animal Dander        Medication List     STOP taking these medications    ibuprofen 200 MG tablet Commonly known as: ADVIL       TAKE these medications    acetaminophen 325 MG tablet Commonly known as: TYLENOL Take 1-2 tablets (325-650 mg total) by mouth every 4 (four) hours as needed for mild pain.   aspirin EC 81 MG tablet Take 81 mg by mouth daily. Swallow whole.   buPROPion 300 MG 24 hr tablet Commonly known as: WELLBUTRIN XL Take 1 tablet (300 mg total) by mouth daily.   cephALEXin 500 MG capsule Commonly known as: KEFLEX Take 1 capsule (500 mg total) by mouth 3 (three) times daily for 7 days.   doxycycline 100  MG tablet Commonly known as: VIBRA-TABS Take 1 tablet (100 mg total) by mouth every 12 (twelve) hours for 7 days.   DULoxetine 60 MG capsule Commonly known as:  CYMBALTA Take 2 capsules (120 mg total) by mouth daily.   mupirocin ointment 2 % Commonly known as: BACTROBAN Apply 1 Application topically 2 (two) times daily. What changed: when to take this   oxyCODONE 5 MG immediate release tablet Commonly known as: Oxy IR/ROXICODONE Take 1 tablet (5 mg total) by mouth every 6 (six) hours as needed for severe pain (pain score 7-10).   pantoprazole 40 MG tablet Commonly known as: PROTONIX Take 2 tablets (80 mg total) by mouth daily.   pregabalin 100 MG capsule Commonly known as: Lyrica Take 1 capsule (100 mg total) by mouth 3 (three) times daily. Start with 1 capsule twice a day for 3, if tolerating and pain is not controlled can increase to three times a day   traZODone 100 MG tablet Commonly known as: DESYREL Take 1 tablet (100 mg total) by mouth at bedtime.   Vraylar 1.5 MG capsule Generic drug: cariprazine Take 1 capsule (1.5 mg total) by mouth daily.        Follow-up Information     [name], FNP. Schedule an appointment as soon as possible for a visit in 2 week(s).   Specialty: Family Medicine Contact information: [address] [place] [address] [phone]         Nadara Mustard, MD. Schedule an appointment as soon as possible for a visit in 1 week(s).   Specialty: Orthopedic Surgery Contact information: [address] [place] [address] [phone]                Discharge Exam: General exam: Alert, awake, oriented x 3; in no acute distress.  Afebrile. Respiratory system: Clear to auscultation. Respiratory effort normal.  Good saturation on room air. Cardiovascular system:RRR. No murmurs, rubs, gallops. Gastrointestinal system: Abdomen is obese, nondistended, soft and nontender. No organomegaly or masses felt. Normal bowel sounds heard. Central nervous system: Alert and oriented. No focal neurological deficits. Extremities: No cyanosis or clubbing; left lower extremity with chronic 1+  edema appreciated.  Right BKA.  Less erythema and per patient description less pain on today's evaluation.  Refer to epic media for visualization changes. Skin: No petechiae. Psychiatry: Judgement and insight appear normal. Mood & affect appropriate.    Condition at discharge: good  The results of significant diagnostics from this hospitalization (including imaging, microbiology, ancillary and laboratory) are listed below for reference.   Imaging Studies: DG Tibia/Fibula Right  Result Date: 03/17/2023 CLINICAL DATA:  Infection/redness EXAM: RIGHT TIBIA AND FIBULA - 2 VIEW COMPARISON:  None Available. FINDINGS: Status post below knee amputation. No cortical irregularity/destruction at the surgical margin to suggest osteomyelitis. Visualized soft tissues are within normal limits. IMPRESSION: Status post below knee amputation. No radiographic evidence of osteomyelitis. Electronically Signed   By: Charline Bills M.D.   On: 03/17/2023 01:08    Microbiology: Results for orders placed or performed during the hospital encounter of 03/16/23  Urine Culture     Status: Abnormal   Collection Time: 03/16/23  8:27 PM   Specimen: Urine, Random  Result Value Ref Range Status   Specimen Description   Final    URINE, RANDOM Performed at [facility], [address], [place], [place] [address]    Special Requests   Final    NONE Reflexed from [id number] Performed at [facility], [address], [place], [place] [address]    Culture (A)  Final    <10,000 COLONIES/mL INSIGNIFICANT GROWTH Performed at [facility], [address], [place], [place] [address]    Report Status 03/18/2023 FINAL  Final  MRSA Next Gen by PCR, Nasal     Status: Abnormal   Collection Time: 03/17/23  9:18 AM   Specimen: Nasal Mucosa; Nasal Swab  Result Value Ref Range Status   MRSA by PCR Next Gen DETECTED (A) NOT DETECTED Final    Comment: RESULT CALLED TO, READ BACK BY AND VERIFIED WITH: WHITNEY EARLY @ 1431 ON 03/17/23  C VARNER Performed at [facility], [address], [place], [place] [address]     Labs: CBC: Recent Labs  Lab 03/16/23 2056 03/17/23 0821  WBC 8.8 7.0  NEUTROABS 6.1  --   HGB 12.3 11.0*  HCT 37.0 33.8*  MCV 93.4 96.3  PLT 247 216   Basic Metabolic Panel: Recent Labs  Lab 03/16/23 2056 03/17/23 0821 03/19/23 0506  NA 136 137 138  K 3.6 3.4* 3.7  CL 103 101 107  CO2 27 29 25   GLUCOSE 96 96 95  BUN 17 13 11   CREATININE 0.57 0.58 0.60  CALCIUM 9.1 8.6* 8.8*  MG  --  1.9  --   PHOS  --  3.4  --    Liver Function Tests: Recent Labs  Lab 03/16/23 2056 03/17/23 0821  AST 15 17  ALT 24 22  ALKPHOS 69 61  BILITOT 0.3 0.5  PROT 7.2 6.6  ALBUMIN 3.9 3.5   CBG: Recent Labs  Lab 03/17/23 1921 03/18/23 0000  GLUCAP 256* 145*    Discharge time spent: greater than 30 minutes.  Signed: Vassie Loll, MD [facility] 03/19/2023

## 2023-03-21 NOTE — Progress Notes (Signed)
Results will be scanned under media tab within 2-3 business days.

## 2023-03-22 ENCOUNTER — Telehealth: Payer: Self-pay

## 2023-03-22 NOTE — Transitions of Care (Post Inpatient/ED Visit) (Signed)
03/22/2023  Name: Taylor [name] MRN: [id number] DOB: 07/22/1990  Today's TOC FU Call Status: Today's TOC FU Call Status:: Successful TOC FU Call Completed TOC FU Call Complete Date: 03/22/23 Patient's Name and Date of Birth confirmed.  Transition Care Management Follow-up Telephone Call Date of Discharge: 03/19/23 [facility] Type of Discharge: Inpatient Admission Primary Inpatient Discharge Diagnosis:: Cellulitis of right lower extremity How have you been since you were released from the hospital?: Better Any questions or concerns?: No  Items Reviewed: Did you receive and understand the discharge instructions provided?: Yes Medications obtained,verified, and reconciled?: Yes (Medications Reviewed) Any new allergies since your discharge?: No Dietary orders reviewed?: NA Do you have support at home?: Yes People in Home: significant other  Medications Reviewed Today: Medications Reviewed Today     Reviewed by Leigh Aurora, CMA (Certified Medical Assistant) on 03/22/23 at 1110  Med List Status: <None>   Medication Order Taking? Sig Documenting Provider Last Dose Status Informant  acetaminophen (TYLENOL) 325 MG tablet [id number] No Take 1-2 tablets (325-650 mg total) by mouth every 4 (four) hours as needed for mild pain. Setzer, Lynnell Jude, PA-C unknown Active Self, Pharmacy Records  aspirin EC 81 MG tablet [id number] No Take 81 mg by mouth daily. Swallow whole. [provider] Past Week Active Self, Pharmacy Records  buPROPion (WELLBUTRIN XL) 300 MG 24 hr tablet [id number] No Take 1 tablet (300 mg total) by mouth daily. [name], [place] 03/16/2023 Active Self, Pharmacy Records  cephALEXin (KEFLEX) 500 MG capsule [id number]  Take 1 capsule (500 mg total) by mouth 3 (three) times daily for 7 days. Vassie Loll, MD  Active   doxycycline (VIBRA-TABS) 100 MG tablet [id number]  Take 1 tablet (100 mg total) by mouth every 12 (twelve) hours for 7  days. Vassie Loll, MD  Active   DULoxetine (CYMBALTA) 60 MG capsule [id number] No Take 2 capsules (120 mg total) by mouth daily. [name], [place] 03/16/2023 Active Self, Pharmacy Records  mupirocin ointment (BACTROBAN) 2 % [id number] No Apply 1 Application topically 2 (two) times daily.  Patient taking differently: Apply 1 Application topically daily.   Adonis Huguenin, NP 03/16/2023 Active Self, Pharmacy Records  oxyCODONE (OXY IR/ROXICODONE) 5 MG immediate release tablet [id number]  Take 1 tablet (5 mg total) by mouth every 6 (six) hours as needed for severe pain (pain score 7-10). Vassie Loll, MD  Active   pantoprazole (PROTONIX) 40 MG tablet [id number] No Take 2 tablets (80 mg total) by mouth daily. [name], [place] 03/16/2023 Active Self, Pharmacy Records  pregabalin (LYRICA) 100 MG capsule [id number] No Take 1 capsule (100 mg total) by mouth 3 (three) times daily. Start with 1 capsule twice a day for 3, if tolerating and pain is not controlled can increase to three times a day Fanny Dance, MD 03/16/2023 Active Self, Pharmacy Records  traZODone (DESYREL) 100 MG tablet [id number] No Take 1 tablet (100 mg total) by mouth at bedtime. [name], FNP 03/16/2023 Active Self, Pharmacy Records  VRAYLAR 1.5 MG capsule [id number] No Take 1 capsule (1.5 mg total) by mouth daily. [name], [place] 03/16/2023 Active Self, Pharmacy Records            Home Care and Equipment/Supplies: Were Home Health Services Ordered?: NA Any new equipment or medical supplies ordered?: NA  Functional Questionnaire: Do you need assistance with bathing/showering or dressing?: No Do you need assistance with meal preparation?: No Do you need assistance with  eating?: No Do you have difficulty maintaining continence: No Do you need assistance with getting out of bed/getting out of a chair/moving?: No Do you have difficulty managing or taking your medications?: No  Follow up appointments  reviewed: PCP Follow-up appointment confirmed?: Yes Date of PCP follow-up appointment?: 03/29/23 Follow-up Provider: [name], FNP Specialist Hospital Follow-up appointment confirmed?: No Reason Specialist Follow-Up Not Confirmed: Patient has Specialist Provider Number and will Call for Appointment Do you need transportation to your follow-up appointment?: No Do you understand care options if your condition(s) worsen?: Yes-patient verbalized understanding    SIGNATURE Agnes Lawrence, CMA (AAMA)  [facility] [phone]

## 2023-03-23 ENCOUNTER — Telehealth: Payer: Self-pay | Admitting: *Deleted

## 2023-03-23 NOTE — Transitions of Care (Post Inpatient/ED Visit) (Signed)
   03/23/2023  Name: Taylor [name] MRN: [id number] DOB: 03-28-1991  Today's TOC FU Call Status: Today's TOC FU Call Status:: Unsuccessful Call (1st Attempt) Unsuccessful Call (1st Attempt) Date: 03/23/23  Attempted to reach the patient regarding the most recent Inpatient/ED visit.  Follow Up Plan: Additional outreach attempts will be made to reach the patient to complete the Transitions of Care (Post Inpatient/ED visit) call.   Gean Maidens BSN RN Population Health- Transition of Care Team.  [facility] [phone]

## 2023-03-28 DIAGNOSIS — M4135 Thoracogenic scoliosis, thoracolumbar region: Secondary | ICD-10-CM | POA: Diagnosis not present

## 2023-03-28 DIAGNOSIS — M546 Pain in thoracic spine: Secondary | ICD-10-CM | POA: Diagnosis not present

## 2023-03-28 DIAGNOSIS — M797 Fibromyalgia: Secondary | ICD-10-CM | POA: Diagnosis not present

## 2023-03-28 DIAGNOSIS — S88111S Complete traumatic amputation at level between knee and ankle, right lower leg, sequela: Secondary | ICD-10-CM | POA: Diagnosis not present

## 2023-03-29 ENCOUNTER — Inpatient Hospital Stay: Payer: Medicare PPO | Admitting: Family

## 2023-04-29 ENCOUNTER — Telehealth: Payer: Self-pay | Admitting: Family

## 2023-04-29 ENCOUNTER — Encounter: Payer: Medicare PPO | Attending: Physical Medicine & Rehabilitation | Admitting: Physical Medicine & Rehabilitation

## 2023-04-29 DIAGNOSIS — M25562 Pain in left knee: Secondary | ICD-10-CM | POA: Insufficient documentation

## 2023-04-29 DIAGNOSIS — G8929 Other chronic pain: Secondary | ICD-10-CM | POA: Insufficient documentation

## 2023-04-29 DIAGNOSIS — G5603 Carpal tunnel syndrome, bilateral upper limbs: Secondary | ICD-10-CM | POA: Insufficient documentation

## 2023-04-29 DIAGNOSIS — Z89511 Acquired absence of right leg below knee: Secondary | ICD-10-CM | POA: Insufficient documentation

## 2023-04-29 DIAGNOSIS — G546 Phantom limb syndrome with pain: Secondary | ICD-10-CM | POA: Insufficient documentation

## 2023-04-29 NOTE — Telephone Encounter (Signed)
[facility] needs addendum notes from [facility] on 02/19/23 faxed over please advise Fax# [phone]

## 2023-04-30 NOTE — Telephone Encounter (Signed)
Sent to [facility].  

## 2023-05-03 ENCOUNTER — Telehealth: Payer: Self-pay | Admitting: Family

## 2023-05-03 NOTE — Telephone Encounter (Signed)
02/19/23 progress note faxed [facility] with prescription-swo

## 2023-05-13 ENCOUNTER — Encounter: Payer: Medicare PPO | Attending: Physical Medicine & Rehabilitation | Admitting: Physical Medicine & Rehabilitation

## 2023-05-13 ENCOUNTER — Inpatient Hospital Stay: Payer: Medicare PPO

## 2023-05-13 ENCOUNTER — Encounter: Payer: Self-pay | Admitting: Physical Medicine & Rehabilitation

## 2023-05-13 ENCOUNTER — Other Ambulatory Visit: Payer: Self-pay

## 2023-05-13 VITALS — BP 135/94 | HR 97 | Ht 64.0 in | Wt 260.0 lb

## 2023-05-13 DIAGNOSIS — F319 Bipolar disorder, unspecified: Secondary | ICD-10-CM | POA: Diagnosis not present

## 2023-05-13 DIAGNOSIS — Z89511 Acquired absence of right leg below knee: Secondary | ICD-10-CM | POA: Diagnosis not present

## 2023-05-13 DIAGNOSIS — D72829 Elevated white blood cell count, unspecified: Secondary | ICD-10-CM

## 2023-05-13 DIAGNOSIS — G5603 Carpal tunnel syndrome, bilateral upper limbs: Secondary | ICD-10-CM | POA: Diagnosis not present

## 2023-05-13 LAB — COMPREHENSIVE METABOLIC PANEL
ALT: 39 U/L (ref 0–44)
AST: 20 U/L (ref 15–41)
Albumin: 4 g/dL (ref 3.5–5.0)
Alkaline Phosphatase: 67 U/L (ref 38–126)
Anion gap: 8 (ref 5–15)
BUN: 11 mg/dL (ref 6–20)
CO2: 26 mmol/L (ref 22–32)
Calcium: 9.7 mg/dL (ref 8.9–10.3)
Chloride: 103 mmol/L (ref 98–111)
Creatinine, Ser: 0.63 mg/dL (ref 0.44–1.00)
GFR, Estimated: >60 mL/min (ref >60)
Glucose, Bld: 94 mg/dL (ref 70–99)
Potassium: 3.8 mmol/L (ref 3.5–5.1)
Sodium: 137 mmol/L (ref 135–145)
Total Bilirubin: 0.6 mg/dL (ref 0.0–1.2)
Total Protein: 7.8 g/dL (ref 6.5–8.1)

## 2023-05-13 LAB — CBC WITH DIFFERENTIAL/PLATELET
Abs Immature Granulocytes: 0.03 10*3/uL (ref 0.00–0.07)
Basophils Absolute: 0 10*3/uL (ref 0.0–0.1)
Basophils Relative: 0 %
Eosinophils Absolute: 0.2 10*3/uL (ref 0.0–0.5)
Eosinophils Relative: 2 %
HCT: 41.7 % (ref 36.0–46.0)
Hemoglobin: 13.5 g/dL (ref 12.0–15.0)
Immature Granulocytes: 0 %
Lymphocytes Relative: 15 %
Lymphs Abs: 1.6 10*3/uL (ref 0.7–4.0)
MCH: 30.9 pg (ref 26.0–34.0)
MCHC: 32.4 g/dL (ref 30.0–36.0)
MCV: 95.4 fL (ref 80.0–100.0)
Monocytes Absolute: 0.6 10*3/uL (ref 0.1–1.0)
Monocytes Relative: 6 %
Neutro Abs: 8.4 10*3/uL — ABNORMAL HIGH (ref 1.7–7.7)
Neutrophils Relative %: 77 %
Platelets: 230 10*3/uL (ref 150–400)
RBC: 4.37 MIL/uL (ref 3.87–5.11)
RDW: 12.7 % (ref 11.5–15.5)
WBC: 10.9 10*3/uL — ABNORMAL HIGH (ref 4.0–10.5)
nRBC: 0 % (ref 0.0–0.2)

## 2023-05-13 LAB — LACTATE DEHYDROGENASE: LDH: 119 U/L (ref 98–192)

## 2023-05-13 MED ORDER — PREGABALIN 150 MG PO CAPS: 150.0000 mg | ORAL_CAPSULE | Freq: Three times a day (TID) | ORAL | 5 refills | Status: DC

## 2023-05-13 MED ORDER — OXYCODONE HCL 5 MG PO TABS: 5.0000 mg | ORAL_TABLET | Freq: Four times a day (QID) | ORAL | 0 refills | Status: AC | PRN
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
## 2023-05-18 NOTE — Progress Notes (Deleted)
 [facility]  REASON FOR VISIT:  Follow-up for CALR+ leukocytosis   CURRENT THERAPY: Observation  INTERVAL HISTORY: [name] is contacted today for follow-up of her CALR + leukocytosis.  She was last seen by Asberry Barefoot PA-C on 04/09/2021. At today's visit, she reports feeling somewhat poorly due to her other chronic complaints and comorbidities unrelated to today's visit. Regarding her history of leukocytosis, she denies any recent infections.  She continues to require intermittent budesonide  for microscopic colitis, most recently about 1 to 2 months ago.  She has successfully quit smoking cigarettes, but is vaping instead.  She says that she knows that vaping is just as bad for her, and she is going to work on quitting that next. She denies any B symptoms such as fever, chills, night sweats, unintentional weight loss.  She has not noticed any new lumps or bumps. She has very little energy and 50 % appetite. She endorses that she is maintaining a stable weight.    OBSERVATIONS/OBJECTIVE: Review of Systems  Constitutional:  Positive for malaise/fatigue. Negative for chills, diaphoresis, fever and weight loss.  Respiratory:  Positive for cough. Negative for shortness of breath.   Cardiovascular:  Positive for palpitations. Negative for chest pain.  Gastrointestinal:  Positive for diarrhea, nausea and vomiting. Negative for abdominal pain, blood in stool and melena.  Musculoskeletal:  Positive for joint pain.  Skin:  Positive for rash.  Neurological:  Positive for tingling, weakness (falls at home) and headaches. Negative for dizziness.  Psychiatric/Behavioral:  Positive for depression. The patient is nervous/anxious and has insomnia.      PHYSICAL EXAM (per limitations of virtual telephone visit): The patient is alert and oriented x 3, exhibiting adequate mentation, good mood, and ability to speak in full sentences and execute sound  judgement.   ASSESSMENT & PLAN: 1.  CALR positive leukocytosis - Intermittent leukocytosis ranging from normal to 17.1 since 2020 - Laboratory work-up found patient to be CALR positive; no steroid use or known rheumatologic disorder.  BCR/ABL and JAK2 [id number] -Leukocytosis is likely a combination of being CALR positive and continuing to smoke 1 PPD cigarettes - Recently finished prednisone  for laryngitis - No B symptoms or lymphadenopathy - No hepatosplenomegaly or lymphadenopathy on exam. - Labs today (10/10/2021): WBC 6.7 with normal differential, LDH normal. - PLAN:  No issues at this time. -- Same-day labs and office visit in 1 year  2.  Tobacco use - Discussed importance of smoking cessation with the patient - Patient acknowledges need to quit smoking, but feels that there is too much external stress in her life at this time -We will discuss this again at her follow-up visit   3.  Family history: - Maternal grandmother had ITP and died of acute leukemia. - Maternal uncle had ITP. - Mother had floor of the mouth cancer.   FOLLOW UP INSTRUCTIONS: - Labs and office visit in 1 year - Patient instructed to follow-up with her PCP for her multiple other issues that are unrelated to her hematologic visit, including her right hand pain, left hip pain, and cough.    I discussed the assessment and treatment plan with the patient. The patient was provided an opportunity to ask questions and all were answered. The patient agreed with the plan and demonstrated an understanding of the instructions.   The patient was advised to call back or seek an in-person evaluation if the symptoms worsen or if the condition fails to improve as anticipated.  I provided 13 minutes of non-face-to-face time during this encounter.   Pleasant CHRISTELLA Barefoot, PA-C 05/18/23 2:14 PM

## 2023-05-19 ENCOUNTER — Inpatient Hospital Stay: Payer: Medicare PPO | Admitting: Oncology

## 2023-05-21 ENCOUNTER — Ambulatory Visit (INDEPENDENT_AMBULATORY_CARE_PROVIDER_SITE_OTHER): Payer: Medicare PPO | Admitting: Psychiatry

## 2023-05-21 ENCOUNTER — Encounter (HOSPITAL_COMMUNITY): Payer: Self-pay | Admitting: Psychiatry

## 2023-05-21 DIAGNOSIS — M545 Low back pain, unspecified: Secondary | ICD-10-CM

## 2023-05-21 DIAGNOSIS — Z8659 Personal history of other mental and behavioral disorders: Secondary | ICD-10-CM

## 2023-05-21 DIAGNOSIS — F411 Generalized anxiety disorder: Secondary | ICD-10-CM

## 2023-05-21 DIAGNOSIS — F410 Panic disorder [episodic paroxysmal anxiety] without agoraphobia: Secondary | ICD-10-CM | POA: Diagnosis not present

## 2023-05-21 DIAGNOSIS — F15982 Other stimulant use, unspecified with stimulant-induced sleep disorder: Secondary | ICD-10-CM

## 2023-05-21 DIAGNOSIS — Z720 Tobacco use: Secondary | ICD-10-CM

## 2023-05-21 DIAGNOSIS — F5022 Bulimia nervosa, moderate: Secondary | ICD-10-CM

## 2023-05-21 DIAGNOSIS — F4312 Post-traumatic stress disorder, chronic: Secondary | ICD-10-CM

## 2023-05-21 DIAGNOSIS — Z87820 Personal history of traumatic brain injury: Secondary | ICD-10-CM | POA: Diagnosis not present

## 2023-05-21 DIAGNOSIS — F502 Bulimia nervosa, unspecified: Secondary | ICD-10-CM | POA: Insufficient documentation

## 2023-05-21 DIAGNOSIS — G8929 Other chronic pain: Secondary | ICD-10-CM

## 2023-05-21 DIAGNOSIS — Z789 Other specified health status: Secondary | ICD-10-CM | POA: Insufficient documentation

## 2023-05-21 DIAGNOSIS — F4000 Agoraphobia, unspecified: Secondary | ICD-10-CM | POA: Diagnosis not present
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
## 2023-05-25 ENCOUNTER — Encounter: Payer: Medicare PPO | Admitting: Orthopaedic Surgery

## 2023-05-25 DIAGNOSIS — G5602 Carpal tunnel syndrome, left upper limb: Secondary | ICD-10-CM

## 2023-05-25 DIAGNOSIS — G5601 Carpal tunnel syndrome, right upper limb: Secondary | ICD-10-CM

## 2023-05-28 DIAGNOSIS — M4135 Thoracogenic scoliosis, thoracolumbar region: Secondary | ICD-10-CM | POA: Diagnosis not present

## 2023-05-28 DIAGNOSIS — M797 Fibromyalgia: Secondary | ICD-10-CM | POA: Diagnosis not present

## 2023-05-28 DIAGNOSIS — M546 Pain in thoracic spine: Secondary | ICD-10-CM | POA: Diagnosis not present

## 2023-05-28 DIAGNOSIS — S88111S Complete traumatic amputation at level between knee and ankle, right lower leg, sequela: Secondary | ICD-10-CM | POA: Diagnosis not present

## 2023-06-03 ENCOUNTER — Ambulatory Visit: Payer: Self-pay | Admitting: Family

## 2023-06-03 NOTE — Telephone Encounter (Signed)
Copied from CRM [id number]. Topic: Clinical - Red Word Triage >> Jun 03, 2023  3:48 PM Ivette P wrote: [facility] that prompted transfer to Nurse Triage: Pt has believes has a blood clot on left leg, and dark pink. Has swelling on leg. 2 inches above the ankle.   Chief Complaint: Left lower leg swelling that has gone down but left a knot, right amputated leg pain with a possible fluid collection Symptoms: Swelling, discoloration Frequency: x 1 week Pertinent Negatives: Patient denies chest pain, difficulty breathing, diarrhea Disposition: [] ED /[] Urgent Care (no appt availability in office) / [] Appointment(In office/virtual)/ []  [facility]/ [] Home Care/ [x] Refused Recommended Disposition /[] [facility]/ []  Follow-up with PCP Additional Notes: Patient called and advised that for the past week she had swelling in the lower part of the left leg and now she has a knot on the outer part of left lower leg and is about two inches from the ankle. Swelling has gone down but there a discolored knot left.  She also said that she has a possible fluid collection that is hard and painful on her right leg that has been amputated.  She was on a blood thinner but now she just takes a baby Aspirin every day.  Patient states that she has no way to get anywhere and didn't want to go to a hospital.  She is advised that the recommendation at this time is to be checked out in the next 4 hours and she is advised that she can call an ambulance to go get checked out. She was getting fitted for a new prosthetic for her right leg and they advised her at that appointment that she should get both legs checked out. She was concerned about a possible blood clot in this left lower leg.  She denies any chest pain, fevers, difficulty breathing, or diarrhea.  She states that she didn't want to go to a hospital at this time but if things get worse or she developed any new symptoms she would call 911.  She also stated that  she would like a call back with advice from her PCP if at all possible.   Reason for Disposition  [1] Thigh, calf, or ankle swelling AND [2] only 1 side  Answer Assessment - Initial Assessment Questions 1. ONSET: "When did the swelling start?" (e.g., minutes, hours, days)     1 week ago 2. LOCATION: "What part of the leg is swollen?"  "Are both legs swollen or just one leg?"     Left lower leg 3. SEVERITY: "How bad is the swelling?" (e.g., localized; mild, moderate, severe)   - Localized: Small area of swelling localized to one leg.   - MILD pedal edema: Swelling limited to foot and ankle, pitting edema < 1/4 inch (6 mm) deep, rest and elevation eliminate most or all swelling.   - MODERATE edema: Swelling of lower leg to knee, pitting edema > 1/4 inch (6 mm) deep, rest and elevation only partially reduce swelling.   - SEVERE edema: Swelling extends above knee, facial or hand swelling present.      Swelling has gone done but knot remains 4. REDNESS: "Does the swelling look red or infected?"     Purple/red/dark pink with a knot 5. PAIN: "Is the swelling painful to touch?" If Yes, ask: "How painful is it?"   (Scale 1-10; mild, moderate or severe)     Yes 6. FEVER: "Do you have a fever?" If Yes, ask: "What  is it, how was it measured, and when did it start?"      No 7. CAUSE: "What do you think is causing the leg swelling?"     Unknown 8. MEDICAL HISTORY: "Do you have a history of blood clots (e.g., DVT), cancer, heart failure, kidney disease, or liver failure?"     Heart anomaly 9. RECURRENT SYMPTOM: "Have you had leg swelling before?" If Yes, ask: "When was the last time?" "What happened that time?"     Some swelling in the past but never a knot like this 10. OTHER SYMPTOMS: "Do you have any other symptoms?" (e.g., chest pain, difficulty breathing)       No 11. PREGNANCY: "Is there any chance you are pregnant?" "When was your last menstrual period?"       No  Protocols used: Leg  Swelling and Edema-A-AH

## 2023-06-07 ENCOUNTER — Telehealth: Payer: Self-pay | Admitting: *Deleted

## 2023-06-07 NOTE — Telephone Encounter (Signed)
Ms Wakeman is requesting a refill on her oxycodone.

## 2023-06-08 ENCOUNTER — Encounter: Payer: Self-pay | Admitting: Physical Medicine & Rehabilitation

## 2023-06-09 MED ORDER — OXYCODONE HCL 5 MG PO TABS: 5.0000 mg | ORAL_TABLET | Freq: Four times a day (QID) | ORAL | 0 refills | Status: DC | PRN

## 2023-06-09 NOTE — Telephone Encounter (Signed)
She has also sent a myChart message about this medication.

## 2023-06-11 DIAGNOSIS — Z89511 Acquired absence of right leg below knee: Secondary | ICD-10-CM | POA: Diagnosis not present

## 2023-06-17 ENCOUNTER — Other Ambulatory Visit: Payer: Self-pay

## 2023-06-17 ENCOUNTER — Ambulatory Visit (HOSPITAL_COMMUNITY): Payer: Medicare PPO | Attending: Physical Medicine & Rehabilitation | Admitting: Physical Therapy

## 2023-06-17 DIAGNOSIS — R296 Repeated falls: Secondary | ICD-10-CM | POA: Insufficient documentation

## 2023-06-17 DIAGNOSIS — M79661 Pain in right lower leg: Secondary | ICD-10-CM | POA: Insufficient documentation

## 2023-06-17 DIAGNOSIS — R601 Generalized edema: Secondary | ICD-10-CM | POA: Insufficient documentation

## 2023-06-17 DIAGNOSIS — Z89511 Acquired absence of right leg below knee: Secondary | ICD-10-CM | POA: Insufficient documentation
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
## 2023-06-28 ENCOUNTER — Ambulatory Visit (HOSPITAL_COMMUNITY): Payer: Medicare PPO

## 2023-06-28 ENCOUNTER — Encounter (HOSPITAL_COMMUNITY): Payer: Self-pay

## 2023-06-28 DIAGNOSIS — M797 Fibromyalgia: Secondary | ICD-10-CM | POA: Diagnosis not present

## 2023-06-28 DIAGNOSIS — R296 Repeated falls: Secondary | ICD-10-CM

## 2023-06-28 DIAGNOSIS — S88111S Complete traumatic amputation at level between knee and ankle, right lower leg, sequela: Secondary | ICD-10-CM | POA: Diagnosis not present

## 2023-06-28 DIAGNOSIS — R601 Generalized edema: Secondary | ICD-10-CM | POA: Diagnosis not present

## 2023-06-28 DIAGNOSIS — M79661 Pain in right lower leg: Secondary | ICD-10-CM

## 2023-06-28 DIAGNOSIS — M4135 Thoracogenic scoliosis, thoracolumbar region: Secondary | ICD-10-CM | POA: Diagnosis not present

## 2023-06-28 DIAGNOSIS — M546 Pain in thoracic spine: Secondary | ICD-10-CM | POA: Diagnosis not present

## 2023-06-28 DIAGNOSIS — Z89511 Acquired absence of right leg below knee: Secondary | ICD-10-CM | POA: Diagnosis not present
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
## 2023-06-30 ENCOUNTER — Ambulatory Visit (HOSPITAL_COMMUNITY): Payer: Medicare PPO | Admitting: Physical Therapy

## 2023-06-30 DIAGNOSIS — R601 Generalized edema: Secondary | ICD-10-CM

## 2023-06-30 DIAGNOSIS — M79661 Pain in right lower leg: Secondary | ICD-10-CM | POA: Diagnosis not present

## 2023-06-30 DIAGNOSIS — R296 Repeated falls: Secondary | ICD-10-CM | POA: Diagnosis not present

## 2023-06-30 DIAGNOSIS — Z89511 Acquired absence of right leg below knee: Secondary | ICD-10-CM | POA: Diagnosis not present
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
## 2023-07-01 ENCOUNTER — Ambulatory Visit: Payer: Medicare PPO | Admitting: Family

## 2023-07-01 ENCOUNTER — Encounter: Payer: Self-pay | Admitting: Family

## 2023-07-01 VITALS — BP 115/77 | HR 91

## 2023-07-01 DIAGNOSIS — R2242 Localized swelling, mass and lump, left lower limb: Secondary | ICD-10-CM

## 2023-07-01 DIAGNOSIS — Z89511 Acquired absence of right leg below knee: Secondary | ICD-10-CM | POA: Diagnosis not present

## 2023-07-01 NOTE — Progress Notes (Signed)
Subjective:    Patient ID: Taylor [name], female    DOB: [birth year], 33 y.o.   MRN: [id number]  Chief Complaint  Patient presents with   Knee Pain    Left, all time   PT presents to the office today with "knot" on left ankle that noticed 2.5  weeks ago. Denies any injury. Reports mild tenderness of 4 out 10. Reports it has slightly improved.   Her anxiety and depression is not controlled. She saw [facility] who wanted to start her on Depakote and stop her other medication. This scared her and did not make a follow up.  Depression        This is a chronic problem.  The current episode started more than 1 year ago.   The problem occurs intermittently.  Associated symptoms include fatigue, helplessness, hopelessness, restlessness and sad.     The symptoms are aggravated by family issues and medication withdrawal.  Past treatments include SNRIs - Serotonin and norepinephrine reuptake inhibitors.  Past medical history includes anxiety.   Anxiety Presents for follow-up visit. Symptoms include depressed mood, irritability, nervous/anxious behavior, obsessions and restlessness. Patient reports no nausea. Symptoms occur most days. The severity of symptoms is moderate.        Review of Systems  Constitutional:  Positive for fatigue and irritability.  Gastrointestinal:  Negative for nausea.  Psychiatric/Behavioral:  Positive for depression. The patient is nervous/anxious.   All other systems reviewed and are negative.   Social History   Socioeconomic History   Marital status: Single    Spouse name: Not on file   Number of children: 2   Years of education: Not on file   Highest education level: Some college, no degree  Occupational History   Occupation: umemployment  Tobacco Use   Smoking status: Every Day    Current packs/day: 0.00    Average packs/day: 1 pack/day for 15.0 years (15.0 ttl pk-yrs)    Types: Cigarettes, E-cigarettes    Start date: 07/2006    Last attempt  to quit: 07/2021    Years since quitting: 1.9    Passive exposure: Past   Smokeless tobacco: Never   Tobacco comments:    Quit cigarettes and currently vaping only  Vaping Use   Vaping status: Former  Substance and Sexual Activity   Alcohol use: Yes    Comment: occasional wine 2-3 glasses socially.  See psychiatry note from 05/21/2023   Drug use: Not Currently    Comment: See psychiatry note from 05/21/2023   Sexual activity: Yes    Birth control/protection: I.U.D.  Other Topics Concern   Not on file  Social History Narrative   Not on file   Social Drivers of Health   Financial Resource Strain: Medium Risk (11/11/2022)   Overall Financial Resource Strain (CARDIA)    Difficulty of Paying Living Expenses: Somewhat hard  Food Insecurity: Food Insecurity Present (03/17/2023)   Hunger Vital Sign    Worried About Running Out of Food in the Last Year: Sometimes true    Ran Out of Food in the Last Year: Sometimes true  Transportation Needs: Unmet Transportation Needs (03/17/2023)   PRAPARE - Administrator, Civil Service (Medical): Yes    Lack of Transportation (Non-Medical): Yes  Physical Activity: Unknown (11/11/2022)   Exercise Vital Sign    Days of Exercise per Week: 0 days    Minutes of Exercise per Session: Not on file  Stress: Stress Concern Present (11/11/2022)   [place]  Institute of Occupational Health - Occupational Stress Questionnaire    Feeling of Stress : Very much  Social Connections: Socially Isolated (11/11/2022)   Social Connection and Isolation Panel [NHANES]    Frequency of Communication with Friends and Family: Twice a week    Frequency of Social Gatherings with Friends and Family: Never    Attends Religious Services: Never    Database administrator or Organizations: No    Attends Engineer, structural: Not on file    Marital Status: Never married   Family History  Problem Relation Age of Onset   Cancer Mother        Mouth   Hypertension Mother     COPD Mother    Hypertension Father    Diabetes Maternal Grandmother    Diabetes Paternal Grandmother    Hypertension Maternal Aunt         Objective:   Physical Exam Vitals reviewed.  Constitutional:      General: She is not in acute distress.    Appearance: She is well-developed. She is obese.  HENT:     Head: Normocephalic and atraumatic.  Eyes:     Pupils: Pupils are equal, round, and reactive to light.  Neck:     Thyroid: No thyromegaly.  Cardiovascular:     Rate and Rhythm: Normal rate and regular rhythm.     Heart sounds: Normal heart sounds. No murmur heard. Pulmonary:     Effort: Pulmonary effort is normal. No respiratory distress.     Breath sounds: Normal breath sounds. No wheezing.  Abdominal:     General: Bowel sounds are normal. There is no distension.     Palpations: Abdomen is soft.     Tenderness: There is no abdominal tenderness.  Musculoskeletal:        General: Swelling and tenderness present.     Cervical back: Normal range of motion and neck supple.       Legs:     Comments: Hard area of left lower leg     Right Lower Extremity: Right leg is amputated above ankle.  Skin:    General: Skin is warm and dry.  Neurological:     Mental Status: She is alert and oriented to person, place, and time.     Cranial Nerves: No cranial nerve deficit.     Deep Tendon Reflexes: Reflexes are normal and symmetric.  Psychiatric:        Behavior: Behavior normal.        Thought Content: Thought content normal.        Judgment: Judgment normal.       BP 115/77   Pulse 91   SpO2 93%      Assessment & Plan:  Taylor [name] comes in today with chief complaint of Knee Pain (Left, all time)   Diagnosis and orders addressed:    1. Mass of left lower leg (Primary) I believe this is a hematoma. However, she denies any injury and area is not discolored at this time and has been 2 1/2 weeks. Given hx of BKA she is anxious about this and we will do [place] to  rule out anything else.  - US Venous Img Lower Unilateral Left; Future - CBC with Differential/Platelet  2. S/P BKA (below knee amputation) unilateral, right (HCC)     [name], FNP

## 2023-07-01 NOTE — Patient Instructions (Signed)
Hematoma A hematoma is a collection of blood under the skin, in an organ, in a body space, in a joint space, or in other tissue. The blood can thicken (clot) to form a lump that you can see and feel. The lump is often firm and may become sore and tender. Most hematomas get better in a few days to weeks. However, some hematomas may be serious and require medical care. Hematomas can range from very small to very large. What are the causes? This condition is caused by: A blunt or penetrating injury. Leakage from a blood vessel under the skin. Some medical procedures, including surgeries, such as oral surgery, face lifts, and surgeries on the joints. Some medical conditions that cause bleeding or bruising. There may be multiple hematomas that appear in different areas of the body. What increases the risk? You are more likely to develop this condition if: You are an older adult. You use blood thinners. You regularly use NSAIDs, such as ibuprofen, for pain. You play contact sports. What are the signs or symptoms?  Symptoms of this condition depend on where the hematoma is located.  Common symptoms of a hematoma that is under the skin include: A firm lump on the body. Pain and tenderness in the area. Bruising. Blue, dark blue, purple-red, or yellowish skin (discoloration) may appear at the site of the hematoma if the hematoma is close to the surface of the skin. Common symptoms of a hematoma that is deep in the tissues or body spaces may be less obvious. They include: A collection of blood in the stomach (intra-abdominal hematoma). This may cause pain in the abdomen, weakness, fainting, and shortness of breath. A collection of blood in the head (intracranial hematoma). This may cause a headache or symptoms such as weakness, trouble speaking or understanding, or a change in consciousness. How is this diagnosed? This condition is diagnosed based on: Your medical history. A physical exam. Imaging  tests, such as an ultrasound or CT scan. These may be needed if your health care provider suspects a hematoma in deeper tissues or body spaces. Blood tests. These may be needed if your health care provider believes that the hematoma is caused by a medical condition. How is this treated? Treatment for this condition depends on the cause, size, and location of the hematoma. Treatment may include: Doing nothing. The majority of hematomas do not need treatment as many of them go away on their own. Surgery or close monitoring. This may be needed for large hematomas or hematomas that affect vital organs. Medicines. Medicines may be given if there is an underlying medical cause for the hematoma. Follow these instructions at home: Managing pain, stiffness, and swelling  If directed, put ice on the injured area. To do this: Put ice in a plastic bag. Place a towel between your skin and the bag. Leave the ice on for 20 minutes, 2-3 times a day for the first couple of days. If your skin turns bright red, remove the ice right away to prevent skin damage. The risk of skin damage is higher if you cannot feel pain, heat, or cold. If directed, apply heat to the affected area as often as told by your health care provider. Use the heat source that your health care provider recommends, such as a moist heat pack or a heating pad. Place a towel between your skin and the heat source. Leave the heat on for 20-30 minutes. If your skin turns bright red, remove the heat right  away to prevent burns. The risk of burns is higher if you cannot feel pain, heat, or cold. Raise (elevate) the injured area above the level of your heart while you are sitting or lying down. If directed, wrap the affected area with an elastic bandage. The bandage applies pressure (compression) to the area, which may help to reduce swelling and promote healing. Do not wrap the bandage too tightly around the affected area. If your hematoma is on a leg  or foot (lower extremity) and is painful, your health care provider may recommend crutches. Use them as told by your health care provider. General instructions Take over-the-counter and prescription medicines only as told by your health care provider. Rest the injured area as directed by your health care provider. Keep all follow-up visits. Your health care provider may want to see how your hematoma is progressing with treatment. Contact a health care provider if: You have a fever. The swelling or discoloration gets worse. You develop more hematomas. Your pain is worse or your pain is not controlled with medicine. Your skin over the hematoma breaks or starts bleeding. Get help right away if: Your hematoma is in your chest or abdomen and you have weakness, shortness of breath, or a change in consciousness. You have a hematoma on your scalp that is caused by a fall or injury, and you also have: A headache that gets worse. Trouble speaking or understanding speech. Weakness. A change in alertness or consciousness. These symptoms may be an emergency. Get help right away. Call 911. Do not wait to see if the symptoms will go away. Do not drive yourself to the hospital. This information is not intended to replace advice given to you by your health care provider. Make sure you discuss any questions you have with your health care provider. Document Revised: 10/20/2021 Document Reviewed: 10/20/2021 Elsevier Patient Education  2024 [employer].

## 2023-07-02 ENCOUNTER — Ambulatory Visit (HOSPITAL_COMMUNITY): Payer: Medicare PPO | Admitting: Physical Therapy

## 2023-07-02 DIAGNOSIS — R601 Generalized edema: Secondary | ICD-10-CM | POA: Diagnosis not present

## 2023-07-02 DIAGNOSIS — M79661 Pain in right lower leg: Secondary | ICD-10-CM

## 2023-07-02 DIAGNOSIS — Z89511 Acquired absence of right leg below knee: Secondary | ICD-10-CM | POA: Diagnosis not present

## 2023-07-02 DIAGNOSIS — R296 Repeated falls: Secondary | ICD-10-CM

## 2023-07-02 LAB — CBC WITH DIFFERENTIAL/PLATELET
Basophils Absolute: 0.1 10*3/uL (ref 0.0–0.2)
Basos: 1 %
EOS (ABSOLUTE): 0.2 10*3/uL (ref 0.0–0.4)
Eos: 3 %
Hematocrit: 40.3 % (ref 34.0–46.6)
Hemoglobin: 13.4 g/dL (ref 11.1–15.9)
Immature Grans (Abs): 0 10*3/uL (ref 0.0–0.1)
Immature Granulocytes: 0 %
Lymphocytes Absolute: 2.4 10*3/uL (ref 0.7–3.1)
Lymphs: 31 %
MCH: 30.6 pg (ref 26.6–33.0)
MCHC: 33.3 g/dL (ref 31.5–35.7)
MCV: 92 fL (ref 79–97)
Monocytes Absolute: 0.5 10*3/uL (ref 0.1–0.9)
Monocytes: 6 %
Neutrophils Absolute: 4.6 10*3/uL (ref 1.4–7.0)
Neutrophils: 59 %
Platelets: 282 10*3/uL (ref 150–450)
RBC: 4.38 x10E6/uL (ref 3.77–5.28)
RDW: 12.3 % (ref 11.7–15.4)
WBC: 7.8 10*3/uL (ref 3.4–10.8)
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
## 2023-07-05 ENCOUNTER — Encounter (HOSPITAL_COMMUNITY): Payer: Medicare PPO

## 2023-07-05 MED ORDER — OXYCODONE HCL 5 MG PO TABS: 5.0000 mg | ORAL_TABLET | Freq: Four times a day (QID) | ORAL | 0 refills | Status: DC | PRN

## 2023-07-07 ENCOUNTER — Ambulatory Visit (HOSPITAL_COMMUNITY): Payer: Medicare PPO | Admitting: Physical Therapy

## 2023-07-07 DIAGNOSIS — Z89511 Acquired absence of right leg below knee: Secondary | ICD-10-CM | POA: Diagnosis not present

## 2023-07-07 DIAGNOSIS — R296 Repeated falls: Secondary | ICD-10-CM | POA: Diagnosis not present

## 2023-07-07 DIAGNOSIS — R601 Generalized edema: Secondary | ICD-10-CM

## 2023-07-07 DIAGNOSIS — M79661 Pain in right lower leg: Secondary | ICD-10-CM | POA: Diagnosis not present
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
## 2023-07-08 ENCOUNTER — Encounter: Payer: Self-pay | Admitting: Physical Medicine & Rehabilitation

## 2023-07-08 ENCOUNTER — Encounter: Payer: Medicare PPO | Attending: Physical Medicine & Rehabilitation | Admitting: Physical Medicine & Rehabilitation

## 2023-07-08 ENCOUNTER — Telehealth: Payer: Self-pay

## 2023-07-08 VITALS — BP 134/94 | HR 103 | Ht 64.0 in | Wt 260.0 lb

## 2023-07-08 DIAGNOSIS — F319 Bipolar disorder, unspecified: Secondary | ICD-10-CM | POA: Diagnosis not present

## 2023-07-08 DIAGNOSIS — G546 Phantom limb syndrome with pain: Secondary | ICD-10-CM | POA: Insufficient documentation

## 2023-07-08 DIAGNOSIS — Z89511 Acquired absence of right leg below knee: Secondary | ICD-10-CM | POA: Diagnosis not present

## 2023-07-08 DIAGNOSIS — G5603 Carpal tunnel syndrome, bilateral upper limbs: Secondary | ICD-10-CM | POA: Insufficient documentation

## 2023-07-08 MED ORDER — PREGABALIN 100 MG PO CAPS: 100.0000 mg | ORAL_CAPSULE | Freq: Three times a day (TID) | ORAL | 2 refills | Status: DC

## 2023-07-08 NOTE — Telephone Encounter (Signed)
 Copied from CRM [id number]. Topic: General - Other >> Jul 08, 2023  9:11 AM Taylor [name] wrote: Reason for CRM: Patient is requesting a callback in regards to a form which needed to be faxed for assistance. The fax number provided is [phone], the patient's contact information is [phone]. Patient states the form is needed ASAP

## 2023-07-08 NOTE — Telephone Encounter (Signed)
 NA/VM full, need to know what type of form.
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
## 2023-07-09 ENCOUNTER — Encounter (HOSPITAL_COMMUNITY): Payer: Medicare PPO | Admitting: Physical Therapy

## 2023-07-09 NOTE — Telephone Encounter (Signed)
 Pt is wanting a PCS form, typed up and placed on PCP desk.

## 2023-07-12 ENCOUNTER — Telehealth (HOSPITAL_COMMUNITY): Payer: Self-pay | Admitting: Physical Therapy

## 2023-07-12 ENCOUNTER — Ambulatory Visit (HOSPITAL_COMMUNITY): Payer: Medicare PPO | Attending: Physical Medicine & Rehabilitation | Admitting: Physical Therapy

## 2023-07-12 DIAGNOSIS — M79661 Pain in right lower leg: Secondary | ICD-10-CM | POA: Insufficient documentation

## 2023-07-12 DIAGNOSIS — R601 Generalized edema: Secondary | ICD-10-CM | POA: Insufficient documentation

## 2023-07-12 NOTE — Telephone Encounter (Signed)
 Pt did not show for appt.  Called phone, however no answer and unable to leave voicemail as mailbox was full.  Pt did cancel last appt due to transportation issue and was last seen Feb 26  Lurena Nida, PTA/CLT [facility] Ph: [phone]

## 2023-07-14 ENCOUNTER — Telehealth (HOSPITAL_COMMUNITY): Payer: Self-pay

## 2023-07-14 ENCOUNTER — Encounter (HOSPITAL_COMMUNITY): Payer: Medicare PPO

## 2023-07-14 NOTE — Telephone Encounter (Signed)
 Na/VM full, PCS form faxed

## 2023-07-14 NOTE — Telephone Encounter (Signed)
 No show #2, called with no answer and mailbox is full, unable to leave message concerning missed apt. Cancelled all remaining apts except next one per no show policy.   Becky Sax, LPTA/CLT; [facility] [phone]

## 2023-07-16 ENCOUNTER — Ambulatory Visit (HOSPITAL_COMMUNITY): Payer: Medicare PPO

## 2023-07-16 ENCOUNTER — Encounter (HOSPITAL_COMMUNITY): Payer: Self-pay

## 2023-07-16 DIAGNOSIS — M79661 Pain in right lower leg: Secondary | ICD-10-CM | POA: Diagnosis not present

## 2023-07-16 DIAGNOSIS — R601 Generalized edema: Secondary | ICD-10-CM
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
## 2023-07-19 ENCOUNTER — Encounter (HOSPITAL_COMMUNITY): Payer: Medicare PPO

## 2023-07-21 ENCOUNTER — Encounter (HOSPITAL_COMMUNITY): Payer: Medicare PPO | Admitting: Physical Therapy

## 2023-07-23 ENCOUNTER — Encounter (HOSPITAL_COMMUNITY): Payer: Medicare PPO | Admitting: Physical Therapy

## 2023-07-26 DIAGNOSIS — M546 Pain in thoracic spine: Secondary | ICD-10-CM | POA: Diagnosis not present

## 2023-07-26 DIAGNOSIS — S88111S Complete traumatic amputation at level between knee and ankle, right lower leg, sequela: Secondary | ICD-10-CM | POA: Diagnosis not present

## 2023-07-26 DIAGNOSIS — M4135 Thoracogenic scoliosis, thoracolumbar region: Secondary | ICD-10-CM | POA: Diagnosis not present

## 2023-07-26 DIAGNOSIS — M797 Fibromyalgia: Secondary | ICD-10-CM | POA: Diagnosis not present

## 2023-08-07 ENCOUNTER — Inpatient Hospital Stay (HOSPITAL_COMMUNITY)

## 2023-08-07 ENCOUNTER — Inpatient Hospital Stay (HOSPITAL_COMMUNITY): Admitting: Anesthesiology

## 2023-08-07 ENCOUNTER — Inpatient Hospital Stay (HOSPITAL_COMMUNITY)
Admission: EM | Admit: 2023-08-07 | Discharge: 2023-08-10 | DRG: 854 | Disposition: A | Discharge status: Home / Self Care | Attending: Internal Medicine | Admitting: Internal Medicine

## 2023-08-07 ENCOUNTER — Encounter (HOSPITAL_COMMUNITY): Payer: Self-pay | Admitting: *Deleted

## 2023-08-07 ENCOUNTER — Encounter (HOSPITAL_COMMUNITY)
Admission: EM | Disposition: A | Discharge status: Home / Self Care | Payer: Self-pay | Source: Home / Self Care | Attending: Internal Medicine

## 2023-08-07 ENCOUNTER — Emergency Department (HOSPITAL_COMMUNITY)

## 2023-08-07 ENCOUNTER — Other Ambulatory Visit: Payer: Self-pay

## 2023-08-07 DIAGNOSIS — Z993 Dependence on wheelchair: Secondary | ICD-10-CM

## 2023-08-07 DIAGNOSIS — Z975 Presence of (intrauterine) contraceptive device: Secondary | ICD-10-CM

## 2023-08-07 DIAGNOSIS — S199XXA Unspecified injury of neck, initial encounter: Secondary | ICD-10-CM | POA: Diagnosis not present

## 2023-08-07 DIAGNOSIS — Z8659 Personal history of other mental and behavioral disorders: Secondary | ICD-10-CM | POA: Diagnosis not present

## 2023-08-07 DIAGNOSIS — E876 Hypokalemia: Secondary | ICD-10-CM | POA: Diagnosis not present

## 2023-08-07 DIAGNOSIS — F419 Anxiety disorder, unspecified: Secondary | ICD-10-CM | POA: Diagnosis present

## 2023-08-07 DIAGNOSIS — A4102 Sepsis due to Methicillin resistant Staphylococcus aureus: Principal | ICD-10-CM | POA: Diagnosis present

## 2023-08-07 DIAGNOSIS — F17210 Nicotine dependence, cigarettes, uncomplicated: Secondary | ICD-10-CM | POA: Diagnosis not present

## 2023-08-07 DIAGNOSIS — S6991XA Unspecified injury of right wrist, hand and finger(s), initial encounter: Secondary | ICD-10-CM | POA: Diagnosis not present

## 2023-08-07 DIAGNOSIS — I96 Gangrene, not elsewhere classified: Secondary | ICD-10-CM | POA: Diagnosis present

## 2023-08-07 DIAGNOSIS — S61222A Laceration with foreign body of right middle finger without damage to nail, initial encounter: Secondary | ICD-10-CM | POA: Diagnosis not present

## 2023-08-07 DIAGNOSIS — L03011 Cellulitis of right finger: Secondary | ICD-10-CM | POA: Diagnosis not present

## 2023-08-07 DIAGNOSIS — Z8616 Personal history of COVID-19: Secondary | ICD-10-CM

## 2023-08-07 DIAGNOSIS — G4733 Obstructive sleep apnea (adult) (pediatric): Secondary | ICD-10-CM

## 2023-08-07 DIAGNOSIS — F418 Other specified anxiety disorders: Secondary | ICD-10-CM | POA: Diagnosis not present

## 2023-08-07 DIAGNOSIS — Z87442 Personal history of urinary calculi: Secondary | ICD-10-CM

## 2023-08-07 DIAGNOSIS — Q2381 Bicuspid aortic valve: Secondary | ICD-10-CM | POA: Diagnosis not present

## 2023-08-07 DIAGNOSIS — M65841 Other synovitis and tenosynovitis, right hand: Secondary | ICD-10-CM | POA: Diagnosis not present

## 2023-08-07 DIAGNOSIS — S60459A Superficial foreign body of unspecified finger, initial encounter: Secondary | ICD-10-CM | POA: Diagnosis not present

## 2023-08-07 DIAGNOSIS — M797 Fibromyalgia: Secondary | ICD-10-CM | POA: Diagnosis present

## 2023-08-07 DIAGNOSIS — L03113 Cellulitis of right upper limb: Secondary | ICD-10-CM | POA: Diagnosis not present

## 2023-08-07 DIAGNOSIS — Z23 Encounter for immunization: Secondary | ICD-10-CM | POA: Diagnosis not present

## 2023-08-07 DIAGNOSIS — L02511 Cutaneous abscess of right hand: Secondary | ICD-10-CM | POA: Diagnosis present

## 2023-08-07 DIAGNOSIS — F17290 Nicotine dependence, other tobacco product, uncomplicated: Secondary | ICD-10-CM | POA: Diagnosis present

## 2023-08-07 DIAGNOSIS — I10 Essential (primary) hypertension: Secondary | ICD-10-CM | POA: Diagnosis present

## 2023-08-07 DIAGNOSIS — M65941 Unspecified synovitis and tenosynovitis, right hand: Secondary | ICD-10-CM | POA: Diagnosis not present

## 2023-08-07 DIAGNOSIS — S60452A Superficial foreign body of right middle finger, initial encounter: Secondary | ICD-10-CM | POA: Diagnosis not present

## 2023-08-07 DIAGNOSIS — M65141 Other infective (teno)synovitis, right hand: Secondary | ICD-10-CM | POA: Diagnosis present

## 2023-08-07 DIAGNOSIS — Z62820 Parent-biological child conflict: Secondary | ICD-10-CM

## 2023-08-07 DIAGNOSIS — Z825 Family history of asthma and other chronic lower respiratory diseases: Secondary | ICD-10-CM

## 2023-08-07 DIAGNOSIS — S88111S Complete traumatic amputation at level between knee and ankle, right lower leg, sequela: Secondary | ICD-10-CM | POA: Diagnosis not present

## 2023-08-07 DIAGNOSIS — R200 Anesthesia of skin: Secondary | ICD-10-CM | POA: Diagnosis present

## 2023-08-07 DIAGNOSIS — Z981 Arthrodesis status: Secondary | ICD-10-CM

## 2023-08-07 DIAGNOSIS — R519 Headache, unspecified: Secondary | ICD-10-CM | POA: Diagnosis not present

## 2023-08-07 DIAGNOSIS — M65949 Unspecified synovitis and tenosynovitis, unspecified hand: Secondary | ICD-10-CM | POA: Diagnosis not present

## 2023-08-07 DIAGNOSIS — Z6841 Body Mass Index (BMI) 40.0 and over, adult: Secondary | ICD-10-CM | POA: Diagnosis not present

## 2023-08-07 DIAGNOSIS — S0990XA Unspecified injury of head, initial encounter: Secondary | ICD-10-CM | POA: Diagnosis not present

## 2023-08-07 DIAGNOSIS — F4310 Post-traumatic stress disorder, unspecified: Secondary | ICD-10-CM | POA: Diagnosis present

## 2023-08-07 DIAGNOSIS — Z720 Tobacco use: Secondary | ICD-10-CM | POA: Diagnosis present

## 2023-08-07 DIAGNOSIS — S0993XA Unspecified injury of face, initial encounter: Secondary | ICD-10-CM | POA: Diagnosis present

## 2023-08-07 DIAGNOSIS — A4159 Other Gram-negative sepsis: Secondary | ICD-10-CM | POA: Diagnosis not present

## 2023-08-07 DIAGNOSIS — Z833 Family history of diabetes mellitus: Secondary | ICD-10-CM

## 2023-08-07 DIAGNOSIS — M79641 Pain in right hand: Secondary | ICD-10-CM | POA: Diagnosis not present

## 2023-08-07 DIAGNOSIS — Z89511 Acquired absence of right leg below knee: Secondary | ICD-10-CM

## 2023-08-07 DIAGNOSIS — D649 Anemia, unspecified: Secondary | ICD-10-CM | POA: Diagnosis present

## 2023-08-07 DIAGNOSIS — M542 Cervicalgia: Secondary | ICD-10-CM | POA: Diagnosis present

## 2023-08-07 DIAGNOSIS — K219 Gastro-esophageal reflux disease without esophagitis: Secondary | ICD-10-CM | POA: Diagnosis present

## 2023-08-07 DIAGNOSIS — F319 Bipolar disorder, unspecified: Secondary | ICD-10-CM | POA: Diagnosis present

## 2023-08-07 DIAGNOSIS — Z8249 Family history of ischemic heart disease and other diseases of the circulatory system: Secondary | ICD-10-CM | POA: Diagnosis not present

## 2023-08-07 DIAGNOSIS — Z79899 Other long term (current) drug therapy: Secondary | ICD-10-CM

## 2023-08-07 DIAGNOSIS — Z7982 Long term (current) use of aspirin: Secondary | ICD-10-CM

## 2023-08-07 DIAGNOSIS — G4700 Insomnia, unspecified: Secondary | ICD-10-CM | POA: Diagnosis present

## 2023-08-07 HISTORY — PX: INCISION AND DRAINAGE OF WOUND: SHX1803

## 2023-08-07 LAB — CBC WITH DIFFERENTIAL/PLATELET
Abs Immature Granulocytes: 0.04 10*3/uL (ref 0.00–0.07)
Basophils Absolute: 0.1 10*3/uL (ref 0.0–0.1)
Basophils Relative: 1 %
Eosinophils Absolute: 0.2 10*3/uL (ref 0.0–0.5)
Eosinophils Relative: 1 %
HCT: 37.5 % (ref 36.0–46.0)
Hemoglobin: 12.8 g/dL (ref 12.0–15.0)
Immature Granulocytes: 0 %
Lymphocytes Relative: 23 %
Lymphs Abs: 3.2 10*3/uL (ref 0.7–4.0)
MCH: 31.1 pg (ref 26.0–34.0)
MCHC: 34.1 g/dL (ref 30.0–36.0)
MCV: 91 fL (ref 80.0–100.0)
Monocytes Absolute: 0.8 10*3/uL (ref 0.1–1.0)
Monocytes Relative: 6 %
Neutro Abs: 9.8 10*3/uL — ABNORMAL HIGH (ref 1.7–7.7)
Neutrophils Relative %: 69 %
Platelets: 260 10*3/uL (ref 150–400)
RBC: 4.12 MIL/uL (ref 3.87–5.11)
RDW: 13.2 % (ref 11.5–15.5)
WBC: 14.1 10*3/uL — ABNORMAL HIGH (ref 4.0–10.5)
nRBC: 0 % (ref 0.0–0.2)

## 2023-08-07 LAB — COMPREHENSIVE METABOLIC PANEL WITH GFR
ALT: 24 U/L (ref 0–44)
AST: 17 U/L (ref 15–41)
Albumin: 3.8 g/dL (ref 3.5–5.0)
Alkaline Phosphatase: 108 U/L (ref 38–126)
Anion gap: 11 (ref 5–15)
BUN: 14 mg/dL (ref 6–20)
CO2: 22 mmol/L (ref 22–32)
Calcium: 9.2 mg/dL (ref 8.9–10.3)
Chloride: 103 mmol/L (ref 98–111)
Creatinine, Ser: 0.54 mg/dL (ref 0.44–1.00)
GFR, Estimated: >60 mL/min (ref >60)
Glucose, Bld: 121 mg/dL — ABNORMAL HIGH (ref 70–99)
Potassium: 3 mmol/L — ABNORMAL LOW (ref 3.5–5.1)
Sodium: 136 mmol/L (ref 135–145)
Total Bilirubin: 0.9 mg/dL (ref 0.0–1.2)
Total Protein: 7.5 g/dL (ref 6.5–8.1)

## 2023-08-07 LAB — BASIC METABOLIC PANEL WITH GFR
Anion gap: 11 (ref 5–15)
BUN: 13 mg/dL (ref 6–20)
CO2: 22 mmol/L (ref 22–32)
Calcium: 8.8 mg/dL — ABNORMAL LOW (ref 8.9–10.3)
Chloride: 101 mmol/L (ref 98–111)
Creatinine, Ser: 0.78 mg/dL (ref 0.44–1.00)
GFR, Estimated: >60 mL/min (ref >60)
Glucose, Bld: 177 mg/dL — ABNORMAL HIGH (ref 70–99)
Potassium: 3.7 mmol/L (ref 3.5–5.1)
Sodium: 134 mmol/L — ABNORMAL LOW (ref 135–145)

## 2023-08-07 LAB — MAGNESIUM: Magnesium: 1.8 mg/dL (ref 1.7–2.4)

## 2023-08-07 LAB — HEMOGLOBIN A1C
Hgb A1c MFr Bld: 4.8 % (ref 4.8–5.6)
Mean Plasma Glucose: 91.06 mg/dL

## 2023-08-07 LAB — POCT PREGNANCY, URINE: Preg Test, Ur: NEGATIVE

## 2023-08-07 LAB — PHOSPHORUS: Phosphorus: 2.1 mg/dL — ABNORMAL LOW (ref 2.5–4.6)

## 2023-08-07 LAB — SURGICAL PCR SCREEN
MRSA, PCR: POSITIVE — AB
Staphylococcus aureus: POSITIVE — AB

## 2023-08-07 LAB — LACTIC ACID, PLASMA: Lactic Acid, Venous: 1.7 mmol/L (ref 0.5–1.9)

## 2023-08-07 SURGERY — IRRIGATION AND DEBRIDEMENT WOUND
Anesthesia: General | Laterality: Right

## 2023-08-07 MED ORDER — MIDAZOLAM HCL 2 MG/2ML IJ SOLN
INTRAMUSCULAR | Status: DC | PRN
  Administered 2023-08-07: 2 mg via INTRAVENOUS

## 2023-08-07 MED ORDER — SODIUM CHLORIDE 0.9 % IV SOLN
3.0000 g | Freq: Four times a day (QID) | INTRAVENOUS | Status: DC
  Filled 2023-08-07: qty 8

## 2023-08-07 MED ORDER — CHLORHEXIDINE GLUCONATE 0.12 % MT SOLN
OROMUCOSAL | Status: AC
  Filled 2023-08-07: qty 15

## 2023-08-07 MED ORDER — DEXTROSE 5 % IV SOLN
15.0000 mmol | Freq: Once | INTRAVENOUS | Status: AC
  Administered 2023-08-08: 15 mmol via INTRAVENOUS
  Filled 2023-08-07: qty 5

## 2023-08-07 MED ORDER — HYDROMORPHONE HCL 1 MG/ML IJ SOLN
INTRAMUSCULAR | Status: DC | PRN
  Administered 2023-08-07: .5 mg via INTRAVENOUS

## 2023-08-07 MED ORDER — FENTANYL CITRATE (PF) 250 MCG/5ML IJ SOLN
INTRAMUSCULAR | Status: DC | PRN
Start: 2023-08-07 — End: 2023-08-07
  Administered 2023-08-07: 50 ug via INTRAVENOUS
  Administered 2023-08-07 (×2): 100 ug via INTRAVENOUS

## 2023-08-07 MED ORDER — MORPHINE SULFATE (PF) 4 MG/ML IV SOLN: 4.0000 mg | Freq: Once | INTRAVENOUS | Status: DC

## 2023-08-07 MED ORDER — DULOXETINE HCL 60 MG PO CPEP
120.0000 mg | ORAL_CAPSULE | Freq: Every day | ORAL | Status: DC
  Administered 2023-08-07 – 2023-08-10 (×4): 120 mg via ORAL
  Filled 2023-08-07 (×4): qty 2

## 2023-08-07 MED ORDER — HYDROMORPHONE HCL 1 MG/ML IJ SOLN
INTRAMUSCULAR | Status: AC
  Filled 2023-08-07: qty 1

## 2023-08-07 MED ORDER — TETANUS-DIPHTH-ACELL PERTUSSIS 5-2.5-18.5 LF-MCG/0.5 IM SUSY
0.5000 mL | PREFILLED_SYRINGE | Freq: Once | INTRAMUSCULAR | Status: AC
  Administered 2023-08-07: 0.5 mL via INTRAMUSCULAR
  Filled 2023-08-07: qty 0.5

## 2023-08-07 MED ORDER — HYDROMORPHONE HCL 1 MG/ML IJ SOLN
0.5000 mg | Freq: Once | INTRAMUSCULAR | Status: AC
  Administered 2023-08-07: 0.5 mg via INTRAVENOUS
  Filled 2023-08-07: qty 0.5

## 2023-08-07 MED ORDER — PROPOFOL 10 MG/ML IV BOLUS
INTRAVENOUS | Status: AC
  Filled 2023-08-07: qty 20

## 2023-08-07 MED ORDER — LIDOCAINE 2% (20 MG/ML) 5 ML SYRINGE
INTRAMUSCULAR | Status: DC | PRN
  Administered 2023-08-07: 100 mg via INTRAVENOUS

## 2023-08-07 MED ORDER — ACETAMINOPHEN 325 MG PO TABS
650.0000 mg | ORAL_TABLET | Freq: Four times a day (QID) | ORAL | Status: DC | PRN
  Administered 2023-08-08: 650 mg via ORAL
  Filled 2023-08-07: qty 2

## 2023-08-07 MED ORDER — OXYCODONE HCL 5 MG PO TABS
5.0000 mg | ORAL_TABLET | Freq: Four times a day (QID) | ORAL | Status: DC | PRN
  Administered 2023-08-07 – 2023-08-10 (×9): 5 mg via ORAL
  Filled 2023-08-07 (×9): qty 1

## 2023-08-07 MED ORDER — OXYCODONE HCL 5 MG/5ML PO SOLN: 5.0000 mg | Freq: Once | ORAL | Status: AC | PRN

## 2023-08-07 MED ORDER — BUPIVACAINE HCL (PF) 0.25 % IJ SOLN
INTRAMUSCULAR | Status: DC | PRN
  Administered 2023-08-07: 10 mL

## 2023-08-07 MED ORDER — VANCOMYCIN HCL IN DEXTROSE 1-5 GM/200ML-% IV SOLN
1000.0000 mg | Freq: Two times a day (BID) | INTRAVENOUS | Status: DC
  Administered 2023-08-07 – 2023-08-10 (×6): 1000 mg via INTRAVENOUS
  Filled 2023-08-07 (×6): qty 200

## 2023-08-07 MED ORDER — MIDAZOLAM HCL 2 MG/2ML IJ SOLN
INTRAMUSCULAR | Status: AC
  Filled 2023-08-07: qty 2

## 2023-08-07 MED ORDER — ONDANSETRON HCL 4 MG/2ML IJ SOLN: 4.0000 mg | Freq: Four times a day (QID) | INTRAMUSCULAR | Status: DC | PRN

## 2023-08-07 MED ORDER — TRAZODONE HCL 50 MG PO TABS
100.0000 mg | ORAL_TABLET | Freq: Every evening | ORAL | Status: DC | PRN
  Administered 2023-08-07 – 2023-08-09 (×3): 100 mg via ORAL
  Filled 2023-08-07 (×3): qty 2

## 2023-08-07 MED ORDER — MUPIROCIN 2 % EX OINT
1.0000 | TOPICAL_OINTMENT | Freq: Two times a day (BID) | CUTANEOUS | Status: DC
  Administered 2023-08-08 – 2023-08-10 (×5): 1 via NASAL
  Filled 2023-08-07 (×2): qty 22

## 2023-08-07 MED ORDER — BUPIVACAINE HCL (PF) 0.25 % IJ SOLN
INTRAMUSCULAR | Status: AC
  Filled 2023-08-07: qty 30

## 2023-08-07 MED ORDER — CHLORHEXIDINE GLUCONATE 0.12 % MT SOLN: 15.0000 mL | Freq: Once | OROMUCOSAL | Status: DC

## 2023-08-07 MED ORDER — AMISULPRIDE (ANTIEMETIC) 5 MG/2ML IV SOLN
10.0000 mg | Freq: Once | INTRAVENOUS | Status: AC | PRN
  Administered 2023-08-07: 10 mg via INTRAVENOUS

## 2023-08-07 MED ORDER — PREGABALIN 100 MG PO CAPS
100.0000 mg | ORAL_CAPSULE | Freq: Three times a day (TID) | ORAL | Status: DC
  Administered 2023-08-07 – 2023-08-10 (×8): 100 mg via ORAL
  Filled 2023-08-07 (×8): qty 1

## 2023-08-07 MED ORDER — HYDROCODONE-ACETAMINOPHEN 5-325 MG PO TABS: 1.0000 | ORAL_TABLET | Freq: Once | ORAL | Status: DC

## 2023-08-07 MED ORDER — HYDROCODONE-ACETAMINOPHEN 5-325 MG PO TABS
1.0000 | ORAL_TABLET | ORAL | Status: DC | PRN
  Administered 2023-08-08 (×2): 1 via ORAL
  Administered 2023-08-08 – 2023-08-10 (×8): 2 via ORAL
  Filled 2023-08-07 (×6): qty 2
  Filled 2023-08-07: qty 1
  Filled 2023-08-07: qty 2
  Filled 2023-08-07: qty 1
  Filled 2023-08-07 (×2): qty 2

## 2023-08-07 MED ORDER — FENTANYL CITRATE (PF) 250 MCG/5ML IJ SOLN
INTRAMUSCULAR | Status: AC
  Filled 2023-08-07: qty 5

## 2023-08-07 MED ORDER — LACTATED RINGERS IV SOLN: INTRAVENOUS | Status: DC

## 2023-08-07 MED ORDER — AMISULPRIDE (ANTIEMETIC) 5 MG/2ML IV SOLN
INTRAVENOUS | Status: AC
  Filled 2023-08-07: qty 4

## 2023-08-07 MED ORDER — VANCOMYCIN HCL IN DEXTROSE 1-5 GM/200ML-% IV SOLN: 1000.0000 mg | Freq: Once | INTRAVENOUS | Status: DC

## 2023-08-07 MED ORDER — MEPERIDINE HCL 25 MG/ML IJ SOLN: 6.2500 mg | INTRAMUSCULAR | Status: DC | PRN

## 2023-08-07 MED ORDER — ACETAMINOPHEN 650 MG RE SUPP: 650.0000 mg | Freq: Four times a day (QID) | RECTAL | Status: DC | PRN

## 2023-08-07 MED ORDER — OXYCODONE HCL 5 MG PO TABS
ORAL_TABLET | ORAL | Status: AC
Start: 2023-08-07 — End: 2023-08-08
  Filled 2023-08-07: qty 1

## 2023-08-07 MED ORDER — SODIUM CHLORIDE 0.9 % IV SOLN
3.0000 g | Freq: Once | INTRAVENOUS | Status: AC
  Administered 2023-08-07: 3 g via INTRAVENOUS
  Filled 2023-08-07: qty 8

## 2023-08-07 MED ORDER — ONDANSETRON HCL 4 MG/2ML IJ SOLN
INTRAMUSCULAR | Status: DC | PRN
  Administered 2023-08-07: 4 mg via INTRAVENOUS

## 2023-08-07 MED ORDER — PROPOFOL 10 MG/ML IV BOLUS
INTRAVENOUS | Status: DC | PRN
  Administered 2023-08-07: 200 mg via INTRAVENOUS
  Administered 2023-08-07: 100 mg via INTRAVENOUS

## 2023-08-07 MED ORDER — CHLORHEXIDINE GLUCONATE CLOTH 2 % EX PADS
6.0000 | MEDICATED_PAD | Freq: Every day | CUTANEOUS | Status: DC
  Administered 2023-08-08 – 2023-08-10 (×3): 6 via TOPICAL

## 2023-08-07 MED ORDER — CARIPRAZINE HCL 1.5 MG PO CAPS
1.5000 mg | ORAL_CAPSULE | Freq: Every day | ORAL | Status: DC
  Administered 2023-08-07 – 2023-08-10 (×4): 1.5 mg via ORAL
  Filled 2023-08-07 (×4): qty 1

## 2023-08-07 MED ORDER — HYDROMORPHONE HCL 1 MG/ML IJ SOLN
INTRAMUSCULAR | Status: AC
  Filled 2023-08-07: qty 0.5

## 2023-08-07 MED ORDER — SODIUM CHLORIDE 0.9 % IR SOLN
Status: DC | PRN
  Administered 2023-08-07: 3000 mL

## 2023-08-07 MED ORDER — HYDROMORPHONE HCL 1 MG/ML IJ SOLN
0.2500 mg | INTRAMUSCULAR | Status: DC | PRN
  Administered 2023-08-07: 0.5 mg via INTRAVENOUS
  Administered 2023-08-07 (×2): 0.25 mg via INTRAVENOUS
  Administered 2023-08-07 (×2): 0.5 mg via INTRAVENOUS

## 2023-08-07 MED ORDER — ONDANSETRON HCL 4 MG PO TABS
4.0000 mg | ORAL_TABLET | Freq: Four times a day (QID) | ORAL | Status: DC | PRN
Start: 2023-08-07 — End: 2023-08-10

## 2023-08-07 MED ORDER — DEXAMETHASONE SODIUM PHOSPHATE 10 MG/ML IJ SOLN
INTRAMUSCULAR | Status: DC | PRN
  Administered 2023-08-07: 10 mg via INTRAVENOUS

## 2023-08-07 MED ORDER — BUPROPION HCL ER (XL) 150 MG PO TB24
300.0000 mg | ORAL_TABLET | Freq: Every day | ORAL | Status: DC
  Administered 2023-08-07 – 2023-08-10 (×4): 300 mg via ORAL
  Filled 2023-08-07 (×4): qty 2

## 2023-08-07 MED ORDER — ORAL CARE MOUTH RINSE: 15.0000 mL | Freq: Once | OROMUCOSAL | Status: DC

## 2023-08-07 MED ORDER — PHENYLEPHRINE 80 MCG/ML (10ML) SYRINGE FOR IV PUSH (FOR BLOOD PRESSURE SUPPORT)
PREFILLED_SYRINGE | INTRAVENOUS | Status: DC | PRN
  Administered 2023-08-07 (×2): 160 ug via INTRAVENOUS

## 2023-08-07 MED ORDER — 0.9 % SODIUM CHLORIDE (POUR BTL) OPTIME
TOPICAL | Status: DC | PRN
  Administered 2023-08-07: 1000 mL

## 2023-08-07 MED ORDER — SODIUM CHLORIDE 0.9 % IV SOLN: INTRAVENOUS | Status: AC

## 2023-08-07 MED ORDER — PANTOPRAZOLE SODIUM 40 MG PO TBEC
40.0000 mg | DELAYED_RELEASE_TABLET | Freq: Two times a day (BID) | ORAL | Status: DC
  Administered 2023-08-07 – 2023-08-10 (×6): 40 mg via ORAL
  Filled 2023-08-07 (×6): qty 1

## 2023-08-07 MED ORDER — OXYCODONE HCL 5 MG PO TABS
5.0000 mg | ORAL_TABLET | Freq: Once | ORAL | Status: AC | PRN
  Administered 2023-08-07: 5 mg via ORAL

## 2023-08-07 MED ORDER — ORAL CARE MOUTH RINSE: 15.0000 mL | OROMUCOSAL | Status: DC | PRN

## 2023-08-07 MED ORDER — SODIUM CHLORIDE 0.9 % IV SOLN
3.0000 g | Freq: Four times a day (QID) | INTRAVENOUS | Status: DC
  Administered 2023-08-07 – 2023-08-10 (×11): 3 g via INTRAVENOUS
  Filled 2023-08-07 (×11): qty 8

## 2023-08-07 MED ORDER — POTASSIUM CHLORIDE 10 MEQ/100ML IV SOLN
10.0000 meq | Freq: Once | INTRAVENOUS | Status: AC
  Administered 2023-08-07: 10 meq via INTRAVENOUS
  Filled 2023-08-07: qty 100

## 2023-08-07 MED ORDER — POTASSIUM CHLORIDE CRYS ER 20 MEQ PO TBCR: 40.0000 meq | EXTENDED_RELEASE_TABLET | Freq: Once | ORAL | Status: DC

## 2023-08-07 SURGICAL SUPPLY — 57 items
BAG COUNTER SPONGE SURGICOUNT (BAG) ×1 IMPLANT
BNDG ELASTIC 2INX 5YD STR LF (GAUZE/BANDAGES/DRESSINGS) IMPLANT
BNDG ELASTIC 2X5.8 VLCR STR LF (GAUZE/BANDAGES/DRESSINGS) ×1 IMPLANT
BNDG ELASTIC 3INX 5YD STR LF (GAUZE/BANDAGES/DRESSINGS) ×1 IMPLANT
BNDG ELASTIC 4X5.8 VLCR STR LF (GAUZE/BANDAGES/DRESSINGS) ×1 IMPLANT
BNDG ESMARK 4X9 LF (GAUZE/BANDAGES/DRESSINGS) IMPLANT
BNDG GAUZE DERMACEA FLUFF 4 (GAUZE/BANDAGES/DRESSINGS) ×2 IMPLANT
CHLORAPREP W/TINT 26 (MISCELLANEOUS) ×1 IMPLANT
CORD BIPOLAR FORCEPS 12FT (ELECTRODE) ×1 IMPLANT
COVER BACK TABLE 60X90IN (DRAPES) IMPLANT
COVER MAYO STAND STRL (DRAPES) ×1 IMPLANT
COVER SURGICAL LIGHT HANDLE (MISCELLANEOUS) ×1 IMPLANT
CUFF TOURN SGL QUICK 18X4 (TOURNIQUET CUFF) ×1 IMPLANT
CUFF TRNQT CYL 24X4X16.5-23 (TOURNIQUET CUFF) IMPLANT
DRAIN PENROSE 18X1/4 LTX STRL (DRAIN) IMPLANT
DRAPE HALF SHEET 40X57 (DRAPES) ×1 IMPLANT
DRAPE OEC MINIVIEW 54X84 (DRAPES) IMPLANT
DRAPE SURG 17X23 STRL (DRAPES) ×1 IMPLANT
DRSG ADAPTIC 3X8 NADH LF (GAUZE/BANDAGES/DRESSINGS) ×1 IMPLANT
GAUZE SPONGE 4X4 12PLY STRL (GAUZE/BANDAGES/DRESSINGS) ×1 IMPLANT
GAUZE STRETCH 2X75IN STRL (MISCELLANEOUS) IMPLANT
GAUZE XEROFORM 1X8 LF (GAUZE/BANDAGES/DRESSINGS) ×1 IMPLANT
GLOVE BIO SURGEON STRL SZ7 (GLOVE) ×1 IMPLANT
GLOVE BIOGEL PI IND STRL 7.0 (GLOVE) ×1 IMPLANT
GOWN STRL REUS W/ TWL XL LVL3 (GOWN DISPOSABLE) ×2 IMPLANT
IV CATH 18G X1.75 CATHLON (IV SOLUTION) IMPLANT
KIT BASIN OR (CUSTOM PROCEDURE TRAY) ×1 IMPLANT
KIT TURNOVER KIT B (KITS) ×1 IMPLANT
LOOP VASCLR MAXI BLUE 18IN ST (MISCELLANEOUS) IMPLANT
LOOPS VASCLR MAXI BLUE 18IN ST (MISCELLANEOUS) IMPLANT
MANIFOLD NEPTUNE II (INSTRUMENTS) IMPLANT
NDL HYPO 25GX1X1/2 BEV (NEEDLE) IMPLANT
NDL HYPO 25X1 1.5 SAFETY (NEEDLE) ×1 IMPLANT
NDL KEITH (NEEDLE) IMPLANT
NEEDLE HYPO 25GX1X1/2 BEV (NEEDLE) IMPLANT
NEEDLE HYPO 25X1 1.5 SAFETY (NEEDLE) ×1 IMPLANT
NEEDLE KEITH (NEEDLE) IMPLANT
NS IRRIG 1000ML POUR BTL (IV SOLUTION) ×1 IMPLANT
PACK ORTHO EXTREMITY (CUSTOM PROCEDURE TRAY) ×1 IMPLANT
PAD ABD 8X10 STRL (GAUZE/BANDAGES/DRESSINGS) ×1 IMPLANT
PAD ARMBOARD POSITIONER FOAM (MISCELLANEOUS) ×1 IMPLANT
PAD CAST 4YDX4 CTTN HI CHSV (CAST SUPPLIES) ×2 IMPLANT
PADDING CAST ABS COTTON 3X4 (CAST SUPPLIES) ×1 IMPLANT
SPLINT PLASTER CAST XFAST 3X15 (CAST SUPPLIES) ×1 IMPLANT
SPONGE T-LAP 4X18 ~~LOC~~+RFID (SPONGE) ×1 IMPLANT
SUT FIBERWIRE 3-0 18 TAPR NDL (SUTURE) IMPLANT
SUTURE FIBERWR 3-0 18 TAPR NDL (SUTURE) IMPLANT
SWAB CULTURE ESWAB REG 1ML (MISCELLANEOUS) IMPLANT
SYR BULB EAR ULCER 3OZ GRN STR (SYRINGE) ×1 IMPLANT
SYR CONTROL 10ML LL (SYRINGE) IMPLANT
TIE VASCULAR MAXI BLUE 18IN ST (MISCELLANEOUS) IMPLANT
TOWEL GREEN STERILE (TOWEL DISPOSABLE) ×1 IMPLANT
TOWEL GREEN STERILE FF (TOWEL DISPOSABLE) ×2 IMPLANT
TUBE CONNECTING 12X1/4 (SUCTIONS) IMPLANT
UNDERPAD 30X36 HEAVY ABSORB (UNDERPADS AND DIAPERS) ×1 IMPLANT
WATER STERILE IRR 1000ML POUR (IV SOLUTION) ×1 IMPLANT
YANKAUER SUCT BULB TIP NO VENT (SUCTIONS) IMPLANT

## 2023-08-07 NOTE — Transfer of Care (Signed)
 Immediate Anesthesia Transfer of Care Note  Patient: Taylor [name]  Procedure(s) Performed: IRRIGATION AND DEBRIDEMENT WOUND (Right)  Patient Location: PACU  Anesthesia Type:General  Level of Consciousness: awake and alert   Airway & Oxygen Therapy: Patient Spontanous Breathing and Patient connected to face mask oxygen  Post-op Assessment: Report given to RN, Post -op Vital signs reviewed and stable, Patient moving all extremities, and Patient moving all extremities X 4  Post vital signs: Reviewed and stable  Last Vitals:  Vitals Value Taken Time  BP 124/92 08/07/23 1803  Temp 98   Pulse 117 08/07/23 1807  Resp 24 08/07/23 1807  SpO2 98 % 08/07/23 1807  Vitals shown include unfiled device data.  Last Pain:  Vitals:   08/07/23 1600  TempSrc: Oral  PainSc:          Complications: There were no known notable events for this encounter.

## 2023-08-07 NOTE — Anesthesia Procedure Notes (Signed)
 Procedure Name: LMA Insertion Date/Time: 08/07/2023 5:16 PM  Performed by: Gloris Ham, CRNAPre-anesthesia Checklist: Patient identified, Emergency Drugs available, Suction available and Patient being monitored Patient Re-evaluated:Patient Re-evaluated prior to induction Oxygen Delivery Method: Circle System Utilized Preoxygenation: Pre-oxygenation with 100% oxygen Induction Type: IV induction Ventilation: Mask ventilation without difficulty LMA: LMA inserted LMA Size: 3.0 Number of attempts: 1 Airway Equipment and Method: Bite block Placement Confirmation: positive ETCO2 Tube secured with: Tape Dental Injury: Teeth and Oropharynx as per pre-operative assessment

## 2023-08-07 NOTE — Assessment & Plan Note (Signed)
-   will replace electrolytes and repeat  check Mg, phos and Ca level and replace as needed Monitor on telemetry   Lab Results  Component Value Date   K 3.0 (L) 08/07/2023     Lab Results  Component Value Date   CREATININE 0.54 08/07/2023   Lab Results  Component Value Date   MG 1.9 03/17/2023   Lab Results  Component Value Date   CALCIUM 9.2 08/07/2023   PHOS 3.4 03/17/2023

## 2023-08-07 NOTE — Interval H&P Note (Signed)
 History and Physical Interval Note:  08/07/2023 5:03 PM  Taylor [name]  has presented today for surgery, with the diagnosis of Right middle finger laceration.  The various methods of treatment have been discussed with the patient and family. After consideration of risks, benefits and other options for treatment, the patient has consented to  Procedure(s) with comments: IRRIGATION AND DEBRIDEMENT WOUND (Right) - R middle finger as a surgical intervention.  The patient's history has been reviewed, patient examined, no change in status, stable for surgery.  I have reviewed the patient's chart and labs.  Questions were answered to the patient's satisfaction.     Lynard Postlewait
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
## 2023-08-07 NOTE — Assessment & Plan Note (Signed)
-   Spoke about importance of quitting spent 5 minutes discussing options for treatment, prior attempts at quitting, and dangers of smoking  -At this point patient is  interested in quitting  - declined nicotine patch   - nursing tobacco cessation protocol

## 2023-08-07 NOTE — Assessment & Plan Note (Signed)
 Cellulitis and abscess of finger secondary to embedded tooth Undergone operative intervention Continue broad-spectrum antibiotic patient with history MRSA continue Vanco and given nature of injury continue Unasyn for today may benefit from ID consult tomorrow Blood cultures and wound cultures perioperative pending
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
## 2023-08-07 NOTE — H&P (Signed)
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 [facility] or [facility] if equivalent level of care is available:: No  Covid Evaluation: Asymptomatic - no recent exposure (last 10 days) testing not required  Diagnosis: Cellulitis of right hand [409811]  Admitting Physician: Onnie Boer [[id number]]  Attending Physician: Onnie Boer [phone]  Certification:: I certify this patient will need inpatient services for at least 2 midnights  Expected Medical Readiness: 08/09/2023           inpatient     I Expect 2 midnight stay secondary to severity of patient's  current illness need for inpatient  interventions justified by the following:  Severe lab/radiological/exam abnormalities including:   Hand abscess and extensive comorbidities including: Morbid Obesity    That are currently affecting medical management.   I expect  patient to be hospitalized for 2 midnights requiring inpatient medical care.  Patient is at high risk for adverse outcome (such as loss of life or disability) if not treated.  Indication for inpatient stay as follows:   severe pain requiring acute inpatient management,     Need for IV antibiotics,     Level of care      medical floor       Therisa Doyne 08/07/2023, 9:55 PM    [facility]     after 2 AM please page floor coverage PA If 7AM-7PM, please contact the day team taking care of the patient using [email or web address]

## 2023-08-07 NOTE — Assessment & Plan Note (Signed)
 Chronic stable right BKA
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
## 2023-08-07 NOTE — Assessment & Plan Note (Signed)
 Contributing to comorbidity and complicating medical management  Body mass index is 44.63 kg/m.  Nutritional follow up as an out pt would be recommended
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
## 2023-08-07 NOTE — Assessment & Plan Note (Signed)
 Chronic stable continue home medication regimen including Wellbutrin VRAYLAR and Cymbalta
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
## 2023-08-07 NOTE — Anesthesia Preprocedure Evaluation (Addendum)
 Anesthesia Evaluation    Reviewed: Allergy & Precautions, Patient's Chart, lab work & pertinent test results, Unable to perform ROS - Chart review only  History of Anesthesia Complications Negative for: history of anesthetic complications  Airway Mallampati: III  TM Distance: >3 FB Neck ROM: Full    Dental  (+) Dental Advisory Given, Edentulous Upper, Missing, Poor Dentition   Pulmonary neg shortness of breath, asthma (childhood) , sleep apnea (does not use CPAP) , neg COPD, neg recent URI, Current Smoker, former smoker   Pulmonary exam normal breath sounds clear to auscultation       Cardiovascular hypertension, Pt. on medications (-) angina (-) Past MI, (-) Cardiac Stents and (-) CABG Normal cardiovascular exam+ dysrhythmias (palpitations) + Valvular Problems/Murmurs (bicuspid aortic valve)  Rhythm:Regular Rate:Normal  TTE 10/11/2018:  1. The left ventricle has normal systolic function, with an ejection fraction of 55-60%. The cavity size was normal. There is mild concentric left ventricular hypertrophy. Left ventricular diastolic parameters were normal. No evidence of left ventricular regional wall motion abnormalities.   2. The right ventricle has normal systolic function. The cavity was normal. There is no increase in right ventricular wall thickness.   3. Left atrial size was mildly dilated.   4. The tricuspid valve is grossly normal.   5. The aortic valve is bicuspid. Mild thickening of the aortic valve. No stenosis.   6. The aortic root is normal in size and structure.     Neuro/Psych  Headaches, neg Seizures PSYCHIATRIC DISORDERS (PTSD, agoraphobia, panic attacks) Anxiety Depression Bipolar Disorder    Neuromuscular disease (lumbar radiculopathy)    GI/Hepatic Neg liver ROS,GERD  Medicated,,  Endo/Other  negative endocrine ROS  Class 3 obesity  Renal/GU Renal disease: h/o stones.negative Renal ROS      Musculoskeletal  (+) Arthritis ,  Fibromyalgia -  Abdominal  (+) + obese  Peds  Hematology  (+) Blood dyscrasia, anemia   Anesthesia Other Findings   Reproductive/Obstetrics                              Anesthesia Physical Anesthesia Plan  ASA: 3  Anesthesia Plan: General   Post-op Pain Management: Ofirmev IV (intra-op)* and Toradol IV (intra-op)*   Induction: Intravenous  PONV Risk Score and Plan: 2 and Ondansetron, Dexamethasone, Treatment may vary due to age or medical condition and Midazolam  Airway Management Planned: LMA  Additional Equipment:   Intra-op Plan:   Post-operative Plan: Extubation in OR  Informed Consent: I have reviewed the patients History and Physical, chart, labs and discussed the procedure including the risks, benefits and alternatives for the proposed anesthesia with the patient or authorized representative who has indicated his/her understanding and acceptance.     Dental advisory given  Plan Discussed with: CRNA  Anesthesia Plan Comments: (Risks of general anesthesia discussed including, but not limited to, sore throat, hoarse voice, chipped/damaged teeth, injury to vocal cords, nausea and vomiting, allergic reactions, lung infection, heart attack, stroke, and death. All questions answered.  )        Anesthesia Quick Evaluation

## 2023-08-07 NOTE — Assessment & Plan Note (Signed)
 Not on blood pressure meds at home
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
## 2023-08-08 ENCOUNTER — Encounter (HOSPITAL_COMMUNITY): Payer: Self-pay | Admitting: Orthopedic Surgery

## 2023-08-08 DIAGNOSIS — M65949 Unspecified synovitis and tenosynovitis, unspecified hand: Secondary | ICD-10-CM | POA: Diagnosis not present

## 2023-08-08 DIAGNOSIS — S60459A Superficial foreign body of unspecified finger, initial encounter: Secondary | ICD-10-CM | POA: Diagnosis not present

## 2023-08-08 DIAGNOSIS — L03113 Cellulitis of right upper limb: Secondary | ICD-10-CM | POA: Diagnosis not present

## 2023-08-08 DIAGNOSIS — S88111S Complete traumatic amputation at level between knee and ankle, right lower leg, sequela: Secondary | ICD-10-CM | POA: Diagnosis not present

## 2023-08-08 LAB — CBC
HCT: 33.7 % — ABNORMAL LOW (ref 36.0–46.0)
Hemoglobin: 11.4 g/dL — ABNORMAL LOW (ref 12.0–15.0)
MCH: 30.9 pg (ref 26.0–34.0)
MCHC: 33.8 g/dL (ref 30.0–36.0)
MCV: 91.3 fL (ref 80.0–100.0)
Platelets: 223 10*3/uL (ref 150–400)
RBC: 3.69 MIL/uL — ABNORMAL LOW (ref 3.87–5.11)
RDW: 13.2 % (ref 11.5–15.5)
WBC: 10.2 10*3/uL (ref 4.0–10.5)
nRBC: 0 % (ref 0.0–0.2)

## 2023-08-08 LAB — COMPREHENSIVE METABOLIC PANEL WITH GFR
ALT: 20 U/L (ref 0–44)
AST: 20 U/L (ref 15–41)
Albumin: 3.2 g/dL — ABNORMAL LOW (ref 3.5–5.0)
Alkaline Phosphatase: 79 U/L (ref 38–126)
Anion gap: 11 (ref 5–15)
BUN: 6 mg/dL (ref 6–20)
CO2: 22 mmol/L (ref 22–32)
Calcium: 8.8 mg/dL — ABNORMAL LOW (ref 8.9–10.3)
Chloride: 104 mmol/L (ref 98–111)
Creatinine, Ser: 0.6 mg/dL (ref 0.44–1.00)
GFR, Estimated: >60 mL/min (ref >60)
Glucose, Bld: 149 mg/dL — ABNORMAL HIGH (ref 70–99)
Potassium: 3.7 mmol/L (ref 3.5–5.1)
Sodium: 137 mmol/L (ref 135–145)
Total Bilirubin: 0.5 mg/dL (ref 0.0–1.2)
Total Protein: 6.3 g/dL — ABNORMAL LOW (ref 6.5–8.1)

## 2023-08-08 LAB — PHOSPHORUS: Phosphorus: 2.8 mg/dL (ref 2.5–4.6)

## 2023-08-08 LAB — MAGNESIUM: Magnesium: 1.9 mg/dL (ref 1.7–2.4)

## 2023-08-08 MED ORDER — POLYETHYLENE GLYCOL 3350 17 G PO PACK
17.0000 g | PACK | Freq: Two times a day (BID) | ORAL | Status: AC
Start: 2023-08-08 — End: 2023-08-10
  Administered 2023-08-08 – 2023-08-09 (×4): 17 g via ORAL
  Filled 2023-08-08 (×4): qty 1

## 2023-08-08 MED ORDER — CHLORHEXIDINE GLUCONATE 4 % EX SOLN
Freq: Every day | CUTANEOUS | Status: DC
  Administered 2023-08-08: 30 mL via TOPICAL
  Filled 2023-08-08 (×4): qty 15
  Filled 2023-08-08: qty 30

## 2023-08-08 MED ORDER — NICOTINE 21 MG/24HR TD PT24
21.0000 mg | MEDICATED_PATCH | Freq: Every day | TRANSDERMAL | Status: DC
  Administered 2023-08-08 – 2023-08-10 (×3): 21 mg via TRANSDERMAL
  Filled 2023-08-08 (×3): qty 1

## 2023-08-08 NOTE — Progress Notes (Signed)
   Subjective:  No acute events overnight.  Resting comfortably this AM.  Pain well controlled.   Objective:   VITALS:   Vitals:   08/07/23 1930 08/07/23 2000 08/08/23 0421 08/08/23 0906  BP: 134/88 (!) 145/92 116/78 118/70  Pulse: 91 96 82 84  Resp: 13 16 16 17   Temp: 98.7 F (37.1 C) 98.5 F (36.9 C) 97.7 F (36.5 C) 98.2 F (36.8 C)  TempSrc:  Oral Oral Oral  SpO2: 95% 96% 92% 97%  Weight:      Height:        Gen: NAD, sleeping comfortably Pulm: Normal WOB on RA CV: Normal rate, BUE warm and well perfused Right hand: Incision is clean and dry with some bloody oozing but no purulence.  Erythema and swelling much improved. Penrose drain in place and was removed.  Limited AROM of middle finger secondary to pain and swelling.  Diminished sensation to light touch in the middle finger and remainder of median nerve distribution.  Fingers warm and well perfused w/ BCR.    Lab Results  Component Value Date   WBC 10.2 08/08/2023   HGB 11.4 (L) 08/08/2023   HCT 33.7 (L) 08/08/2023   MCV 91.3 08/08/2023   PLT 223 08/08/2023     Assessment/Plan:  33 yo F POD 1 s/p I&D of right middle finger flexor tenosynovitis with removal of retained tooth fragment.  - No further surgical plans - Will begin daily wound care with warm, diluted Hibiclens solution and clean, dry dressings.  I have encouraged patient to continue daily wound care at home with warm water and antibacterial soap.  Detailed instructions were given - Continue broad spectrum IV abx pending culture speciation - Can be discharged with oral abx pending culture results - I can see her back in the office in 1 week for wound check  Marlyne Beards, MD 08/08/2023, 11:12 AM [phone]

## 2023-08-08 NOTE — Anesthesia Postprocedure Evaluation (Signed)
 Anesthesia Post Note  Patient: Taylor [name]  Procedure(s) Performed: IRRIGATION AND DEBRIDEMENT WOUND (Right)     Patient location during evaluation: PACU Anesthesia Type: General Level of consciousness: sedated and patient cooperative Pain management: pain level controlled Vital Signs Assessment: post-procedure vital signs reviewed and stable Respiratory status: spontaneous breathing Cardiovascular status: stable Anesthetic complications: no   There were no known notable events for this encounter.  Last Vitals:  Vitals:   08/07/23 1930 08/07/23 2000  BP: 134/88 (!) 145/92  Pulse: 91 96  Resp: 13 16  Temp: 37.1 C 36.9 C  SpO2: 95% 96%    Last Pain:  Vitals:   08/07/23 2216  TempSrc:   PainSc: Asleep                 Lewie Loron
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
## 2023-08-08 NOTE — Progress Notes (Signed)
 Transition of Care [facility]) - CAGE-AID Screening   Patient Details  Name: Taylor [name] MRN: [id number] Date of Birth: 1990-06-16  Hewitt Shorts, RN Trauma Response Nurse Phone Number: [phone] 08/08/2023, 4:34 PM       CAGE-AID Screening:    Have You Ever Felt You Ought to Cut Down on Your Drinking or Drug Use?: No Have People Annoyed You By Critizing Your Drinking Or Drug Use?: No Have You Felt Bad Or Guilty About Your Drinking Or Drug Use?: No Have You Ever Had a Drink or Used Drugs First Thing In The Morning to Steady Your Nerves or to Get Rid of a Hangover?: No CAGE-AID Score: 0  Substance Abuse Education Offered: (S) No (Denies etoh/illicit drug use)

## 2023-08-09 DIAGNOSIS — L03113 Cellulitis of right upper limb: Secondary | ICD-10-CM | POA: Diagnosis not present
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
## 2023-08-10 ENCOUNTER — Other Ambulatory Visit (HOSPITAL_COMMUNITY): Payer: Self-pay

## 2023-08-10 ENCOUNTER — Encounter: Payer: Medicare PPO | Admitting: Physical Medicine & Rehabilitation

## 2023-08-10 DIAGNOSIS — S60459A Superficial foreign body of unspecified finger, initial encounter: Secondary | ICD-10-CM | POA: Diagnosis not present

## 2023-08-10 DIAGNOSIS — I10 Essential (primary) hypertension: Secondary | ICD-10-CM | POA: Diagnosis not present

## 2023-08-10 DIAGNOSIS — L03113 Cellulitis of right upper limb: Secondary | ICD-10-CM | POA: Diagnosis not present

## 2023-08-10 LAB — AEROBIC/ANAEROBIC CULTURE W GRAM STAIN (SURGICAL/DEEP WOUND)

## 2023-08-10 LAB — MRSA NEXT GEN BY PCR, NASAL: MRSA by PCR Next Gen: DETECTED — AB

## 2023-08-10 MED ORDER — AMOXICILLIN-POT CLAVULANATE 875-125 MG PO TABS: 1.0000 | ORAL_TABLET | Freq: Two times a day (BID) | ORAL | Status: DC

## 2023-08-10 MED ORDER — LINEZOLID 600 MG PO TABS
600.0000 mg | ORAL_TABLET | Freq: Two times a day (BID) | ORAL | 0 refills | Status: AC
  Filled 2023-08-10: qty 28, 14d supply, fill #0

## 2023-08-10 MED ORDER — CHLORHEXIDINE GLUCONATE 4 % EX SOLN
CUTANEOUS | Status: AC
  Filled 2023-08-10: qty 30

## 2023-08-10 MED ORDER — AMOXICILLIN-POT CLAVULANATE 875-125 MG PO TABS
1.0000 | ORAL_TABLET | Freq: Two times a day (BID) | ORAL | 0 refills | Status: AC
  Filled 2023-08-10: qty 28, 14d supply, fill #0

## 2023-08-10 MED ORDER — HYDROCODONE-ACETAMINOPHEN 5-325 MG PO TABS
1.0000 | ORAL_TABLET | ORAL | 0 refills | Status: AC | PRN
Start: 2023-08-10 — End: 2023-08-15
  Filled 2023-08-10: qty 30, 3d supply, fill #0

## 2023-08-10 MED ORDER — CHLORHEXIDINE GLUCONATE 4 % EX SOLN
CUTANEOUS | Status: AC
  Filled 2023-08-10: qty 15

## 2023-08-10 NOTE — Plan of Care (Signed)
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
## 2023-08-10 NOTE — Plan of Care (Signed)

## 2023-08-10 NOTE — Progress Notes (Signed)
 Reviewed AVS, patient expressed understanding of medications, MD follow up reviewed.   Patient informed medications are ready to be picked up in [facility] pharmacy.  Charge Nurse informed; stated they would speak to the patient about going to discharge lounge.
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
## 2023-08-11 ENCOUNTER — Ambulatory Visit (HOSPITAL_COMMUNITY): Attending: Physical Medicine & Rehabilitation | Admitting: Physical Therapy

## 2023-08-11 DIAGNOSIS — R601 Generalized edema: Secondary | ICD-10-CM | POA: Diagnosis not present

## 2023-08-11 DIAGNOSIS — R296 Repeated falls: Secondary | ICD-10-CM | POA: Insufficient documentation

## 2023-08-11 DIAGNOSIS — Z89511 Acquired absence of right leg below knee: Secondary | ICD-10-CM | POA: Diagnosis not present

## 2023-08-11 DIAGNOSIS — M79661 Pain in right lower leg: Secondary | ICD-10-CM | POA: Diagnosis not present
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
## 2023-08-12 ENCOUNTER — Encounter: Attending: Physical Medicine & Rehabilitation | Admitting: Physical Medicine & Rehabilitation

## 2023-08-12 DIAGNOSIS — Z89511 Acquired absence of right leg below knee: Secondary | ICD-10-CM | POA: Insufficient documentation

## 2023-08-12 DIAGNOSIS — G546 Phantom limb syndrome with pain: Secondary | ICD-10-CM | POA: Insufficient documentation

## 2023-08-12 DIAGNOSIS — G5603 Carpal tunnel syndrome, bilateral upper limbs: Secondary | ICD-10-CM | POA: Insufficient documentation

## 2023-08-12 DIAGNOSIS — F319 Bipolar disorder, unspecified: Secondary | ICD-10-CM | POA: Insufficient documentation

## 2023-08-12 LAB — CULTURE, BLOOD (ROUTINE X 2)
Special Requests: ADEQUATE
Special Requests: ADEQUATE

## 2023-08-13 ENCOUNTER — Encounter (HOSPITAL_COMMUNITY): Admitting: Physical Therapy

## 2023-08-13 ENCOUNTER — Ambulatory Visit (HOSPITAL_COMMUNITY): Admitting: Physical Therapy

## 2023-08-13 DIAGNOSIS — R601 Generalized edema: Secondary | ICD-10-CM | POA: Diagnosis not present

## 2023-08-13 DIAGNOSIS — Z89511 Acquired absence of right leg below knee: Secondary | ICD-10-CM | POA: Diagnosis not present

## 2023-08-13 DIAGNOSIS — R296 Repeated falls: Secondary | ICD-10-CM

## 2023-08-13 DIAGNOSIS — M79661 Pain in right lower leg: Secondary | ICD-10-CM
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
## 2023-08-15 ENCOUNTER — Emergency Department (HOSPITAL_COMMUNITY)
Admission: EM | Admit: 2023-08-15 | Discharge: 2023-08-15 | Disposition: A | Discharge status: Home / Self Care | Attending: Emergency Medicine | Admitting: Emergency Medicine

## 2023-08-15 ENCOUNTER — Other Ambulatory Visit: Payer: Self-pay

## 2023-08-15 DIAGNOSIS — J45909 Unspecified asthma, uncomplicated: Secondary | ICD-10-CM | POA: Diagnosis not present

## 2023-08-15 DIAGNOSIS — F17210 Nicotine dependence, cigarettes, uncomplicated: Secondary | ICD-10-CM | POA: Diagnosis not present

## 2023-08-15 DIAGNOSIS — S61202D Unspecified open wound of right middle finger without damage to nail, subsequent encounter: Secondary | ICD-10-CM | POA: Diagnosis not present

## 2023-08-15 DIAGNOSIS — Z5189 Encounter for other specified aftercare: Secondary | ICD-10-CM | POA: Diagnosis not present

## 2023-08-15 DIAGNOSIS — Z8616 Personal history of COVID-19: Secondary | ICD-10-CM | POA: Insufficient documentation

## 2023-08-15 DIAGNOSIS — Z89511 Acquired absence of right leg below knee: Secondary | ICD-10-CM | POA: Diagnosis not present

## 2023-08-15 DIAGNOSIS — I10 Essential (primary) hypertension: Secondary | ICD-10-CM | POA: Insufficient documentation

## 2023-08-15 DIAGNOSIS — Z4801 Encounter for change or removal of surgical wound dressing: Secondary | ICD-10-CM | POA: Diagnosis not present

## 2023-08-15 NOTE — ED Notes (Signed)
 ED Provider at bedside.

## 2023-08-15 NOTE — ED Triage Notes (Signed)
 Pt states she had surgery on her hand to remove a tooth on approx 1 week ago. Pt thinks the wound has reopened. Pt extremely jittery and unable to sit still in triage.

## 2023-08-15 NOTE — Discharge Instructions (Signed)
 You were evaluated in the Emergency Department and after careful evaluation, we did not find any emergent condition requiring admission or further testing in the hospital.  Your exam/testing today was overall reassuring.  Postoperative wound seems to be well-healing.  Recommend continued dressing changes as we discussed, follow-up closely with your surgeon.  Continue your home antibiotics.  Please return to the Emergency Department if you experience any worsening of your condition.  Thank you for allowing [place] to be a part of your care.
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
## 2023-08-16 ENCOUNTER — Telehealth (HOSPITAL_COMMUNITY): Payer: Self-pay | Admitting: Physical Therapy

## 2023-08-16 ENCOUNTER — Encounter (HOSPITAL_COMMUNITY): Admitting: Physical Therapy

## 2023-08-16 NOTE — Telephone Encounter (Signed)
 Pt did not show.  Called number without answer and VM was full.    Lurena Nida, PTA/CLT [facility] Ph: [phone]

## 2023-08-17 ENCOUNTER — Other Ambulatory Visit: Payer: Self-pay | Admitting: Family

## 2023-08-17 DIAGNOSIS — K219 Gastro-esophageal reflux disease without esophagitis: Secondary | ICD-10-CM

## 2023-08-18 ENCOUNTER — Ambulatory Visit (HOSPITAL_COMMUNITY): Admitting: Physical Therapy

## 2023-08-18 ENCOUNTER — Encounter (HOSPITAL_COMMUNITY): Admitting: Physical Therapy

## 2023-08-18 DIAGNOSIS — M79661 Pain in right lower leg: Secondary | ICD-10-CM

## 2023-08-18 DIAGNOSIS — R296 Repeated falls: Secondary | ICD-10-CM | POA: Diagnosis not present

## 2023-08-18 DIAGNOSIS — R601 Generalized edema: Secondary | ICD-10-CM | POA: Diagnosis not present

## 2023-08-18 DIAGNOSIS — Z89511 Acquired absence of right leg below knee: Secondary | ICD-10-CM | POA: Diagnosis not present
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
## 2023-08-20 ENCOUNTER — Encounter (HOSPITAL_COMMUNITY): Admitting: Physical Therapy

## 2023-08-23 ENCOUNTER — Encounter (HOSPITAL_COMMUNITY): Admitting: Physical Therapy

## 2023-08-25 ENCOUNTER — Encounter (HOSPITAL_COMMUNITY): Admitting: Physical Therapy

## 2023-08-25 ENCOUNTER — Ambulatory Visit (HOSPITAL_COMMUNITY): Admitting: Physical Therapy

## 2023-08-25 DIAGNOSIS — M79661 Pain in right lower leg: Secondary | ICD-10-CM | POA: Diagnosis not present

## 2023-08-25 DIAGNOSIS — R601 Generalized edema: Secondary | ICD-10-CM | POA: Diagnosis not present

## 2023-08-25 DIAGNOSIS — Z89511 Acquired absence of right leg below knee: Secondary | ICD-10-CM | POA: Diagnosis not present

## 2023-08-25 DIAGNOSIS — R296 Repeated falls: Secondary | ICD-10-CM
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
## 2023-08-26 DIAGNOSIS — M4135 Thoracogenic scoliosis, thoracolumbar region: Secondary | ICD-10-CM | POA: Diagnosis not present

## 2023-08-26 DIAGNOSIS — S88111S Complete traumatic amputation at level between knee and ankle, right lower leg, sequela: Secondary | ICD-10-CM | POA: Diagnosis not present

## 2023-08-26 DIAGNOSIS — M797 Fibromyalgia: Secondary | ICD-10-CM | POA: Diagnosis not present

## 2023-08-26 DIAGNOSIS — M546 Pain in thoracic spine: Secondary | ICD-10-CM | POA: Diagnosis not present

## 2023-08-27 ENCOUNTER — Telehealth (HOSPITAL_COMMUNITY): Payer: Self-pay | Admitting: Physical Therapy

## 2023-08-27 ENCOUNTER — Encounter (HOSPITAL_COMMUNITY): Admitting: Physical Therapy

## 2023-08-27 NOTE — Telephone Encounter (Signed)
 2nd no show:  PT called she is at the MD office for her hand and forgot to call.   PT was informed that we would cancel all future appointments except for Mondays and she would need to make one appointment at a time.  Pt vocalized that this would be better for her anyhow.    Taylor [name], PT CLT [phone]

## 2023-08-30 ENCOUNTER — Encounter (HOSPITAL_COMMUNITY): Admitting: Physical Therapy

## 2023-08-30 ENCOUNTER — Telehealth (HOSPITAL_COMMUNITY): Payer: Self-pay | Admitting: Physical Therapy

## 2023-08-30 NOTE — Telephone Encounter (Signed)
 Pt did not show.  Called to discuss current attendance as she has not returned since last week and fails to cancel her appts.  Mailbox was full and unable to leave message.  Lorenso Romance, PTA/CLT [facility] Ph: [phone]

## 2023-08-31 ENCOUNTER — Encounter (HOSPITAL_COMMUNITY): Payer: Self-pay | Admitting: Physical Therapy

## 2023-08-31 NOTE — Therapy (Unsigned)
 PHYSICAL THERAPY DISCHARGE SUMMARY  Visits from Start of Care: 10  Current functional level related to goals / functional outcomes: Considerable decrease in edema   Remaining deficits: PT had not started formal gait training with prothesis.   Education / Equipment: Pt had been instructed to begin a wearing schedule with prothesis but not to be up on it a lot as this might cause skin breakdown.    Patient agrees to discharge. Patient goals were partially met. Patient is being discharged due to not returning since the last visit.  PT has had three no shows.  Pt was called but there was no way to leave a message.  Leodis Rainwater, PT CLT [phone]

## 2023-09-01 ENCOUNTER — Encounter (HOSPITAL_COMMUNITY)

## 2023-09-03 ENCOUNTER — Encounter (HOSPITAL_COMMUNITY)

## 2023-09-06 ENCOUNTER — Encounter (HOSPITAL_COMMUNITY): Admitting: Physical Therapy

## 2023-09-06 ENCOUNTER — Encounter (HOSPITAL_COMMUNITY)

## 2023-09-08 ENCOUNTER — Encounter (HOSPITAL_COMMUNITY): Admitting: Physical Therapy

## 2023-09-08 ENCOUNTER — Encounter (HOSPITAL_COMMUNITY)

## 2023-09-10 ENCOUNTER — Encounter (HOSPITAL_COMMUNITY)

## 2023-09-13 ENCOUNTER — Encounter: Admitting: Advanced Practice Midwife

## 2023-09-13 ENCOUNTER — Encounter: Attending: Physical Medicine & Rehabilitation | Admitting: Physical Medicine & Rehabilitation

## 2023-09-13 ENCOUNTER — Encounter: Payer: Self-pay | Admitting: Physical Medicine & Rehabilitation

## 2023-09-13 VITALS — BP 121/77 | HR 103 | Ht 64.0 in | Wt 260.0 lb

## 2023-09-13 DIAGNOSIS — Z89511 Acquired absence of right leg below knee: Secondary | ICD-10-CM | POA: Diagnosis present

## 2023-09-13 DIAGNOSIS — F319 Bipolar disorder, unspecified: Secondary | ICD-10-CM | POA: Insufficient documentation

## 2023-09-13 DIAGNOSIS — G546 Phantom limb syndrome with pain: Secondary | ICD-10-CM | POA: Diagnosis present

## 2023-09-13 DIAGNOSIS — G5603 Carpal tunnel syndrome, bilateral upper limbs: Secondary | ICD-10-CM | POA: Diagnosis present

## 2023-09-13 MED ORDER — PREGABALIN 150 MG PO CAPS: 150.0000 mg | ORAL_CAPSULE | Freq: Three times a day (TID) | ORAL | 2 refills | Status: AC
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
## 2023-09-14 ENCOUNTER — Encounter (HOSPITAL_COMMUNITY)

## 2023-09-16 ENCOUNTER — Encounter (HOSPITAL_COMMUNITY)

## 2023-09-21 ENCOUNTER — Encounter (HOSPITAL_COMMUNITY)

## 2023-09-23 ENCOUNTER — Encounter (HOSPITAL_COMMUNITY)

## 2023-09-25 DIAGNOSIS — M4135 Thoracogenic scoliosis, thoracolumbar region: Secondary | ICD-10-CM | POA: Diagnosis not present

## 2023-09-25 DIAGNOSIS — M797 Fibromyalgia: Secondary | ICD-10-CM | POA: Diagnosis not present

## 2023-09-25 DIAGNOSIS — M546 Pain in thoracic spine: Secondary | ICD-10-CM | POA: Diagnosis not present

## 2023-09-25 DIAGNOSIS — S88111S Complete traumatic amputation at level between knee and ankle, right lower leg, sequela: Secondary | ICD-10-CM | POA: Diagnosis not present

## 2023-09-26 ENCOUNTER — Other Ambulatory Visit: Payer: Self-pay | Admitting: Family

## 2023-09-26 DIAGNOSIS — K219 Gastro-esophageal reflux disease without esophagitis: Secondary | ICD-10-CM

## 2023-09-27 ENCOUNTER — Encounter: Payer: Self-pay | Admitting: Family

## 2023-09-27 NOTE — Telephone Encounter (Signed)
 Letter sent.

## 2023-09-27 NOTE — Telephone Encounter (Signed)
 Christy pt NTBS 30-d given 08/17/23

## 2023-09-29 ENCOUNTER — Encounter: Admitting: Advanced Practice Midwife

## 2023-09-30 ENCOUNTER — Other Ambulatory Visit: Payer: Self-pay | Admitting: Family

## 2023-09-30 DIAGNOSIS — F411 Generalized anxiety disorder: Secondary | ICD-10-CM

## 2023-09-30 DIAGNOSIS — F321 Major depressive disorder, single episode, moderate: Secondary | ICD-10-CM

## 2023-09-30 DIAGNOSIS — F39 Unspecified mood [affective] disorder: Secondary | ICD-10-CM

## 2023-09-30 DIAGNOSIS — F319 Bipolar disorder, unspecified: Secondary | ICD-10-CM

## 2023-10-22 ENCOUNTER — Encounter: Attending: Physical Medicine & Rehabilitation | Admitting: Physical Medicine & Rehabilitation

## 2023-10-22 DIAGNOSIS — G5603 Carpal tunnel syndrome, bilateral upper limbs: Secondary | ICD-10-CM | POA: Insufficient documentation

## 2023-10-22 DIAGNOSIS — F319 Bipolar disorder, unspecified: Secondary | ICD-10-CM | POA: Insufficient documentation

## 2023-10-22 DIAGNOSIS — Z89511 Acquired absence of right leg below knee: Secondary | ICD-10-CM | POA: Insufficient documentation

## 2023-10-22 DIAGNOSIS — G546 Phantom limb syndrome with pain: Secondary | ICD-10-CM | POA: Insufficient documentation

## 2023-10-26 DIAGNOSIS — M546 Pain in thoracic spine: Secondary | ICD-10-CM | POA: Diagnosis not present

## 2023-10-26 DIAGNOSIS — S88111S Complete traumatic amputation at level between knee and ankle, right lower leg, sequela: Secondary | ICD-10-CM | POA: Diagnosis not present

## 2023-10-26 DIAGNOSIS — M4135 Thoracogenic scoliosis, thoracolumbar region: Secondary | ICD-10-CM | POA: Diagnosis not present

## 2023-10-26 DIAGNOSIS — M797 Fibromyalgia: Secondary | ICD-10-CM | POA: Diagnosis not present

## 2023-10-28 ENCOUNTER — Encounter (HOSPITAL_COMMUNITY): Payer: Self-pay

## 2023-10-28 ENCOUNTER — Other Ambulatory Visit: Payer: Self-pay

## 2023-10-28 ENCOUNTER — Inpatient Hospital Stay (HOSPITAL_COMMUNITY)
Admission: EM | Admit: 2023-10-28 | Discharge: 2023-11-15 | DRG: 958 | Disposition: A | Discharge status: Home Health | Attending: General Surgery | Admitting: General Surgery

## 2023-10-28 ENCOUNTER — Emergency Department (HOSPITAL_COMMUNITY)

## 2023-10-28 DIAGNOSIS — Z6841 Body Mass Index (BMI) 40.0 and over, adult: Secondary | ICD-10-CM

## 2023-10-28 DIAGNOSIS — R9431 Abnormal electrocardiogram [ECG] [EKG]: Secondary | ICD-10-CM | POA: Diagnosis not present

## 2023-10-28 DIAGNOSIS — I10 Essential (primary) hypertension: Secondary | ICD-10-CM | POA: Diagnosis present

## 2023-10-28 DIAGNOSIS — K219 Gastro-esophageal reflux disease without esophagitis: Secondary | ICD-10-CM | POA: Diagnosis present

## 2023-10-28 DIAGNOSIS — S32462A Displaced associated transverse-posterior fracture of left acetabulum, initial encounter for closed fracture: Principal | ICD-10-CM | POA: Diagnosis present

## 2023-10-28 DIAGNOSIS — R918 Other nonspecific abnormal finding of lung field: Secondary | ICD-10-CM | POA: Diagnosis not present

## 2023-10-28 DIAGNOSIS — F32A Depression, unspecified: Secondary | ICD-10-CM | POA: Diagnosis not present

## 2023-10-28 DIAGNOSIS — G588 Other specified mononeuropathies: Secondary | ICD-10-CM | POA: Diagnosis present

## 2023-10-28 DIAGNOSIS — Y92488 Other paved roadways as the place of occurrence of the external cause: Secondary | ICD-10-CM

## 2023-10-28 DIAGNOSIS — Z4682 Encounter for fitting and adjustment of non-vascular catheter: Secondary | ICD-10-CM | POA: Diagnosis not present

## 2023-10-28 DIAGNOSIS — F10929 Alcohol use, unspecified with intoxication, unspecified: Secondary | ICD-10-CM

## 2023-10-28 DIAGNOSIS — T1490XA Injury, unspecified, initial encounter: Secondary | ICD-10-CM | POA: Diagnosis present

## 2023-10-28 DIAGNOSIS — I2489 Other forms of acute ischemic heart disease: Secondary | ICD-10-CM | POA: Diagnosis present

## 2023-10-28 DIAGNOSIS — Z8616 Personal history of COVID-19: Secondary | ICD-10-CM

## 2023-10-28 DIAGNOSIS — J9383 Other pneumothorax: Secondary | ICD-10-CM

## 2023-10-28 DIAGNOSIS — Z5986 Financial insecurity: Secondary | ICD-10-CM

## 2023-10-28 DIAGNOSIS — D62 Acute posthemorrhagic anemia: Secondary | ICD-10-CM | POA: Diagnosis not present

## 2023-10-28 DIAGNOSIS — S2249XA Multiple fractures of ribs, unspecified side, initial encounter for closed fracture: Secondary | ICD-10-CM

## 2023-10-28 DIAGNOSIS — J9811 Atelectasis: Secondary | ICD-10-CM | POA: Diagnosis not present

## 2023-10-28 DIAGNOSIS — S73005A Unspecified dislocation of left hip, initial encounter: Secondary | ICD-10-CM

## 2023-10-28 DIAGNOSIS — Z7401 Bed confinement status: Secondary | ICD-10-CM | POA: Diagnosis not present

## 2023-10-28 DIAGNOSIS — R030 Elevated blood-pressure reading, without diagnosis of hypertension: Secondary | ICD-10-CM | POA: Diagnosis present

## 2023-10-28 DIAGNOSIS — J45909 Unspecified asthma, uncomplicated: Secondary | ICD-10-CM | POA: Diagnosis not present

## 2023-10-28 DIAGNOSIS — F411 Generalized anxiety disorder: Secondary | ICD-10-CM | POA: Diagnosis present

## 2023-10-28 DIAGNOSIS — K5900 Constipation, unspecified: Secondary | ICD-10-CM | POA: Diagnosis not present

## 2023-10-28 DIAGNOSIS — F319 Bipolar disorder, unspecified: Secondary | ICD-10-CM | POA: Diagnosis present

## 2023-10-28 DIAGNOSIS — Z01810 Encounter for preprocedural cardiovascular examination: Secondary | ICD-10-CM

## 2023-10-28 DIAGNOSIS — M25552 Pain in left hip: Secondary | ICD-10-CM | POA: Diagnosis present

## 2023-10-28 DIAGNOSIS — R7989 Other specified abnormal findings of blood chemistry: Secondary | ICD-10-CM | POA: Diagnosis not present

## 2023-10-28 DIAGNOSIS — S7402XA Injury of sciatic nerve at hip and thigh level, left leg, initial encounter: Secondary | ICD-10-CM | POA: Diagnosis present

## 2023-10-28 DIAGNOSIS — Y908 Blood alcohol level of 240 mg/100 ml or more: Secondary | ICD-10-CM | POA: Diagnosis present

## 2023-10-28 DIAGNOSIS — R0989 Other specified symptoms and signs involving the circulatory and respiratory systems: Secondary | ICD-10-CM | POA: Diagnosis not present

## 2023-10-28 DIAGNOSIS — J939 Pneumothorax, unspecified: Secondary | ICD-10-CM | POA: Diagnosis not present

## 2023-10-28 DIAGNOSIS — Z833 Family history of diabetes mellitus: Secondary | ICD-10-CM | POA: Diagnosis not present

## 2023-10-28 DIAGNOSIS — E669 Obesity, unspecified: Secondary | ICD-10-CM | POA: Diagnosis present

## 2023-10-28 DIAGNOSIS — S270XXA Traumatic pneumothorax, initial encounter: Secondary | ICD-10-CM | POA: Diagnosis present

## 2023-10-28 DIAGNOSIS — S72002A Fracture of unspecified part of neck of left femur, initial encounter for closed fracture: Secondary | ICD-10-CM | POA: Diagnosis not present

## 2023-10-28 DIAGNOSIS — Z981 Arthrodesis status: Secondary | ICD-10-CM

## 2023-10-28 DIAGNOSIS — I4510 Unspecified right bundle-branch block: Secondary | ICD-10-CM | POA: Diagnosis present

## 2023-10-28 DIAGNOSIS — S2221XA Fracture of manubrium, initial encounter for closed fracture: Secondary | ICD-10-CM | POA: Diagnosis present

## 2023-10-28 DIAGNOSIS — R0781 Pleurodynia: Secondary | ICD-10-CM | POA: Diagnosis not present

## 2023-10-28 DIAGNOSIS — S2243XA Multiple fractures of ribs, bilateral, initial encounter for closed fracture: Secondary | ICD-10-CM | POA: Diagnosis present

## 2023-10-28 DIAGNOSIS — M25572 Pain in left ankle and joints of left foot: Secondary | ICD-10-CM | POA: Diagnosis not present

## 2023-10-28 DIAGNOSIS — F17290 Nicotine dependence, other tobacco product, uncomplicated: Secondary | ICD-10-CM | POA: Diagnosis present

## 2023-10-28 DIAGNOSIS — S3991XA Unspecified injury of abdomen, initial encounter: Secondary | ICD-10-CM | POA: Diagnosis not present

## 2023-10-28 DIAGNOSIS — M797 Fibromyalgia: Secondary | ICD-10-CM | POA: Diagnosis present

## 2023-10-28 DIAGNOSIS — G4733 Obstructive sleep apnea (adult) (pediatric): Secondary | ICD-10-CM | POA: Diagnosis not present

## 2023-10-28 DIAGNOSIS — R7401 Elevation of levels of liver transaminase levels: Secondary | ICD-10-CM | POA: Diagnosis present

## 2023-10-28 DIAGNOSIS — S73015A Posterior dislocation of left hip, initial encounter: Secondary | ICD-10-CM | POA: Diagnosis present

## 2023-10-28 DIAGNOSIS — Z0189 Encounter for other specified special examinations: Secondary | ICD-10-CM | POA: Diagnosis not present

## 2023-10-28 DIAGNOSIS — S2249XB Multiple fractures of ribs, unspecified side, initial encounter for open fracture: Secondary | ICD-10-CM | POA: Diagnosis not present

## 2023-10-28 DIAGNOSIS — M25562 Pain in left knee: Secondary | ICD-10-CM | POA: Diagnosis not present

## 2023-10-28 DIAGNOSIS — S199XXA Unspecified injury of neck, initial encounter: Secondary | ICD-10-CM | POA: Diagnosis not present

## 2023-10-28 DIAGNOSIS — F17210 Nicotine dependence, cigarettes, uncomplicated: Secondary | ICD-10-CM | POA: Diagnosis present

## 2023-10-28 DIAGNOSIS — Z79899 Other long term (current) drug therapy: Secondary | ICD-10-CM

## 2023-10-28 DIAGNOSIS — S73016A Posterior dislocation of unspecified hip, initial encounter: Secondary | ICD-10-CM | POA: Diagnosis not present

## 2023-10-28 DIAGNOSIS — J984 Other disorders of lung: Secondary | ICD-10-CM | POA: Diagnosis not present

## 2023-10-28 DIAGNOSIS — M79606 Pain in leg, unspecified: Secondary | ICD-10-CM | POA: Diagnosis not present

## 2023-10-28 DIAGNOSIS — I517 Cardiomegaly: Secondary | ICD-10-CM | POA: Diagnosis not present

## 2023-10-28 DIAGNOSIS — S2242XA Multiple fractures of ribs, left side, initial encounter for closed fracture: Secondary | ICD-10-CM | POA: Diagnosis not present

## 2023-10-28 DIAGNOSIS — Z89511 Acquired absence of right leg below knee: Secondary | ICD-10-CM | POA: Diagnosis not present

## 2023-10-28 DIAGNOSIS — S27321A Contusion of lung, unilateral, initial encounter: Secondary | ICD-10-CM | POA: Diagnosis present

## 2023-10-28 DIAGNOSIS — Z825 Family history of asthma and other chronic lower respiratory diseases: Secondary | ICD-10-CM

## 2023-10-28 DIAGNOSIS — R768 Other specified abnormal immunological findings in serum: Secondary | ICD-10-CM | POA: Diagnosis present

## 2023-10-28 DIAGNOSIS — S32409A Unspecified fracture of unspecified acetabulum, initial encounter for closed fracture: Secondary | ICD-10-CM | POA: Diagnosis not present

## 2023-10-28 DIAGNOSIS — M19072 Primary osteoarthritis, left ankle and foot: Secondary | ICD-10-CM | POA: Diagnosis not present

## 2023-10-28 DIAGNOSIS — F10129 Alcohol abuse with intoxication, unspecified: Secondary | ICD-10-CM | POA: Diagnosis present

## 2023-10-28 DIAGNOSIS — Z8249 Family history of ischemic heart disease and other diseases of the circulatory system: Secondary | ICD-10-CM

## 2023-10-28 DIAGNOSIS — F1910 Other psychoactive substance abuse, uncomplicated: Secondary | ICD-10-CM | POA: Diagnosis present

## 2023-10-28 DIAGNOSIS — Z5982 Transportation insecurity: Secondary | ICD-10-CM

## 2023-10-28 DIAGNOSIS — S0990XA Unspecified injury of head, initial encounter: Secondary | ICD-10-CM | POA: Diagnosis not present

## 2023-10-28 DIAGNOSIS — S27329A Contusion of lung, unspecified, initial encounter: Secondary | ICD-10-CM

## 2023-10-28 DIAGNOSIS — G4700 Insomnia, unspecified: Secondary | ICD-10-CM

## 2023-10-28 DIAGNOSIS — S32402A Unspecified fracture of left acetabulum, initial encounter for closed fracture: Secondary | ICD-10-CM | POA: Diagnosis not present

## 2023-10-28 DIAGNOSIS — S2243XD Multiple fractures of ribs, bilateral, subsequent encounter for fracture with routine healing: Secondary | ICD-10-CM | POA: Diagnosis not present

## 2023-10-28 DIAGNOSIS — F17200 Nicotine dependence, unspecified, uncomplicated: Secondary | ICD-10-CM | POA: Diagnosis not present

## 2023-10-28 DIAGNOSIS — Z7982 Long term (current) use of aspirin: Secondary | ICD-10-CM

## 2023-10-28 DIAGNOSIS — S32492A Other specified fracture of left acetabulum, initial encounter for closed fracture: Secondary | ICD-10-CM | POA: Diagnosis not present

## 2023-10-28 DIAGNOSIS — R5383 Other fatigue: Secondary | ICD-10-CM | POA: Diagnosis not present

## 2023-10-28 LAB — CBC WITH DIFFERENTIAL/PLATELET
Abs Immature Granulocytes: 0.21 10*3/uL — ABNORMAL HIGH (ref 0.00–0.07)
Basophils Absolute: 0.1 10*3/uL (ref 0.0–0.1)
Basophils Relative: 1 %
Eosinophils Absolute: 0 10*3/uL (ref 0.0–0.5)
Eosinophils Relative: 0 %
HCT: 32.1 % — ABNORMAL LOW (ref 36.0–46.0)
Hemoglobin: 9.9 g/dL — ABNORMAL LOW (ref 12.0–15.0)
Immature Granulocytes: 2 %
Lymphocytes Relative: 24 %
Lymphs Abs: 2.5 10*3/uL (ref 0.7–4.0)
MCH: 25.1 pg — ABNORMAL LOW (ref 26.0–34.0)
MCHC: 30.8 g/dL (ref 30.0–36.0)
MCV: 81.3 fL (ref 80.0–100.0)
Monocytes Absolute: 0.5 10*3/uL (ref 0.1–1.0)
Monocytes Relative: 5 %
Neutro Abs: 7.1 10*3/uL (ref 1.7–7.7)
Neutrophils Relative %: 68 %
Platelets: 408 10*3/uL — ABNORMAL HIGH (ref 150–400)
RBC: 3.95 MIL/uL (ref 3.87–5.11)
RDW: 18.2 % — ABNORMAL HIGH (ref 11.5–15.5)
WBC: 10.4 10*3/uL (ref 4.0–10.5)
nRBC: 0 % (ref 0.0–0.2)

## 2023-10-28 LAB — COMPREHENSIVE METABOLIC PANEL WITH GFR
ALT: 116 U/L — ABNORMAL HIGH (ref 0–44)
AST: 196 U/L — ABNORMAL HIGH (ref 15–41)
Albumin: 3.5 g/dL (ref 3.5–5.0)
Alkaline Phosphatase: 110 U/L (ref 38–126)
Anion gap: 10 (ref 5–15)
BUN: 10 mg/dL (ref 6–20)
CO2: 23 mmol/L (ref 22–32)
Calcium: 8.3 mg/dL — ABNORMAL LOW (ref 8.9–10.3)
Chloride: 105 mmol/L (ref 98–111)
Creatinine, Ser: 0.79 mg/dL (ref 0.44–1.00)
GFR, Estimated: >60 mL/min (ref >60)
Glucose, Bld: 125 mg/dL — ABNORMAL HIGH (ref 70–99)
Potassium: 3.5 mmol/L (ref 3.5–5.1)
Sodium: 138 mmol/L (ref 135–145)
Total Bilirubin: 0.2 mg/dL (ref 0.0–1.2)
Total Protein: 7.7 g/dL (ref 6.5–8.1)

## 2023-10-28 LAB — ETHANOL: Alcohol, Ethyl (B): 289 mg/dL — ABNORMAL HIGH (ref <15)

## 2023-10-28 LAB — HCG, QUANTITATIVE, PREGNANCY: hCG, Beta Chain, Quant, S: 1 m[IU]/mL (ref <5)

## 2023-10-28 MED ORDER — IOHEXOL 350 MG/ML SOLN
100.0000 mL | Freq: Once | INTRAVENOUS | Status: AC | PRN
  Administered 2023-10-28: 100 mL via INTRAVENOUS

## 2023-10-28 MED ORDER — SODIUM CHLORIDE 0.9 % IV BOLUS
1000.0000 mL | Freq: Once | INTRAVENOUS | Status: AC
  Administered 2023-10-28: 1000 mL via INTRAVENOUS

## 2023-10-28 MED ORDER — MORPHINE SULFATE (PF) 4 MG/ML IV SOLN
4.0000 mg | Freq: Once | INTRAVENOUS | Status: AC
  Administered 2023-10-29: 4 mg via INTRAVENOUS
  Filled 2023-10-28: qty 1

## 2023-10-28 MED ORDER — IOHEXOL 300 MG/ML  SOLN: 100.0000 mL | Freq: Once | INTRAMUSCULAR | Status: DC | PRN

## 2023-10-28 MED ORDER — MORPHINE SULFATE (PF) 2 MG/ML IV SOLN
2.0000 mg | Freq: Once | INTRAVENOUS | Status: AC
  Administered 2023-10-28: 2 mg via INTRAVENOUS
  Filled 2023-10-28: qty 1
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
## 2023-10-28 NOTE — Discharge Instructions (Addendum)
 Weightbearing: Non weightbearing to the left lower extremity ROM: Unrestricted range of motion Incisional and dressing care:  Change dressing as needed Showering: Okay to get incision wet Orthopedic device(s): None  Please take Eliquis  2.5 mg twice daily x 30 days for DVT prophylaxis Please continue 2000 units Vit D supplementation daily x 90 days Ortho follow -up plan: 2 weeks after d/c for wound check and repeat x-rays                                      Outpatient Substance Abuse  Treatment- uninsured  Narcotics Anonymous 24-HOUR HELPLINE Pre-recorded for Meeting Schedules [facility] [phone]  [email or web address] ALCOHOLICS ANONYMOUS  [place] [facility]   Answering Service [phone] Please Note: All [place] Meetings are Non-smoking [email or web address]  Alcohol and Drug Services -  Insurance: Medicaid /State funding/private insurance Methadone, suboxone/Intensive outpatient  [facility]   [phone] Fax: [phone] [address], [place], [place], [address] [place] [phone] Fax: [phone]    [address] [place], [place], [address] ([address] [place], [place], [place], [place], [place], [place], [place], [place]) [facility] [email or web address] Accepts State funding/Medicaid Transitional housing, Intensive Outpatient Treatment, Outpatient treatment, Veterans Services  Phone: [phone] Fax: [phone] Address: [address] [place] [address], [place] [address]   [facility] [employer] of [facility] ([email or web address].[email or web address]/) Insurance: Medicaid Case Management, Administrator, arts, Medication Management, Outpatient Therapy, Psychosocial Rehabilitation, Substance Abuse Intensive Outpatient  Phone: [phone] Fax: [phone] [name], [place], [place], [address]  [facility] Medicaid, most private insurance providers Types of Program: Individual/Group Therapy, Substance Abuse Treatment  Phone: [place]  [phone] Fax: [phone] [address], [place], [place], [address] [place] [phone] [address], [address] [name], [place], [address]  [facility]  Medicaid Types of Program: SAIOP  Phone: [phone] Fax: [phone] [address] [place], [place], [place], [address] [facility] Medicaid/state funds Crisis line [phone] [facility] [phone] [place] [phone] [place] [phone]  [facility] [address];  [place], [place] [address] [phone]  Substance Abuse Intensive Outpatient Program [facility] [address] [name], [place] [address] [phone]- Substance abuse treatment  Successful Transitions  Insurance: [facility], [address], sliding scale Types of Program: substance abuse treatment, transportation assistance Phone: [phone] Fax: [phone] Address: [address], [place] [address] [facility] ([email or web address].[email or web address]) Insurance: UHC, [place], [place], [place] of [place] Program: addiction counseling, detoxification,  Phone: [phone]  Fax: [phone] Address: [address] [place], [place] [address]  [facility] (statewide facilities/programs) [address] (Medicaid/state funds) [place], [place] [address]                      [email or web address] [phone] [name]- [phone] [place]- [phone] [facility] [place] (2 Locations) (Medicaid/state funds) --[address]  walk in 8:30-12 and 1-2:30 [place], [id number]   [facility]- [phone] --[address] [place], [place] [address]  [phone] walk in 8:30-12 and 2-3:30  [facility] state funds/medicaid [address] [place], [place] [address] [phone] [facility] (Suboxone clinic) Medicaid/state funds  [address]  [place], [name] [address] [phone]   [facility]  [address], [place], [place] [address]  [phone] (24 hours)  [place]- [address] [place], [place] [address]  [phone] (24 hours) [place]- [address] [name] [phone] [place]- [address] [place] [phone] [name] [address] [name] [phone] [facility]- Medicaid and state funds  [place]- [address] [place], [place] [address] [phone] (24 hours) [place]- [address] [place], [place] [address] [phone] [facility]- [address] [place], [place] [address] [phone] (24 hours) [place] [address] [place], [place]  [address] [phone] [place]- [address]  [phone]

## 2023-10-28 NOTE — ED Notes (Signed)
 Dr Cherl [facility] paged to Dr Bryna Car @ [phone]

## 2023-10-29 ENCOUNTER — Inpatient Hospital Stay (HOSPITAL_COMMUNITY): Admitting: Certified Registered Nurse Anesthetist

## 2023-10-29 ENCOUNTER — Other Ambulatory Visit: Payer: Self-pay

## 2023-10-29 ENCOUNTER — Inpatient Hospital Stay (HOSPITAL_COMMUNITY)

## 2023-10-29 ENCOUNTER — Encounter (HOSPITAL_COMMUNITY): Admission: EM | Disposition: A | Discharge status: Home Health | Payer: Self-pay | Source: Home / Self Care

## 2023-10-29 ENCOUNTER — Encounter (HOSPITAL_COMMUNITY): Payer: Self-pay

## 2023-10-29 DIAGNOSIS — Z01810 Encounter for preprocedural cardiovascular examination: Secondary | ICD-10-CM

## 2023-10-29 DIAGNOSIS — J45909 Unspecified asthma, uncomplicated: Secondary | ICD-10-CM

## 2023-10-29 DIAGNOSIS — F17200 Nicotine dependence, unspecified, uncomplicated: Secondary | ICD-10-CM | POA: Diagnosis not present

## 2023-10-29 DIAGNOSIS — S72002A Fracture of unspecified part of neck of left femur, initial encounter for closed fracture: Secondary | ICD-10-CM | POA: Diagnosis not present

## 2023-10-29 DIAGNOSIS — M25572 Pain in left ankle and joints of left foot: Secondary | ICD-10-CM | POA: Diagnosis not present

## 2023-10-29 DIAGNOSIS — Y92488 Other paved roadways as the place of occurrence of the external cause: Secondary | ICD-10-CM | POA: Diagnosis not present

## 2023-10-29 DIAGNOSIS — J939 Pneumothorax, unspecified: Secondary | ICD-10-CM | POA: Diagnosis not present

## 2023-10-29 DIAGNOSIS — S32409A Unspecified fracture of unspecified acetabulum, initial encounter for closed fracture: Secondary | ICD-10-CM | POA: Diagnosis not present

## 2023-10-29 DIAGNOSIS — S7402XA Injury of sciatic nerve at hip and thigh level, left leg, initial encounter: Secondary | ICD-10-CM | POA: Diagnosis present

## 2023-10-29 DIAGNOSIS — J9383 Other pneumothorax: Secondary | ICD-10-CM | POA: Diagnosis present

## 2023-10-29 DIAGNOSIS — R5383 Other fatigue: Secondary | ICD-10-CM | POA: Diagnosis not present

## 2023-10-29 DIAGNOSIS — F17290 Nicotine dependence, other tobacco product, uncomplicated: Secondary | ICD-10-CM | POA: Diagnosis not present

## 2023-10-29 DIAGNOSIS — M25562 Pain in left knee: Secondary | ICD-10-CM | POA: Diagnosis not present

## 2023-10-29 DIAGNOSIS — R0781 Pleurodynia: Secondary | ICD-10-CM | POA: Diagnosis not present

## 2023-10-29 DIAGNOSIS — Z825 Family history of asthma and other chronic lower respiratory diseases: Secondary | ICD-10-CM | POA: Diagnosis not present

## 2023-10-29 DIAGNOSIS — R7989 Other specified abnormal findings of blood chemistry: Secondary | ICD-10-CM | POA: Diagnosis not present

## 2023-10-29 DIAGNOSIS — Z981 Arthrodesis status: Secondary | ICD-10-CM | POA: Diagnosis not present

## 2023-10-29 DIAGNOSIS — S2249XA Multiple fractures of ribs, unspecified side, initial encounter for closed fracture: Secondary | ICD-10-CM

## 2023-10-29 DIAGNOSIS — F10129 Alcohol abuse with intoxication, unspecified: Secondary | ICD-10-CM | POA: Diagnosis present

## 2023-10-29 DIAGNOSIS — S27329A Contusion of lung, unspecified, initial encounter: Secondary | ICD-10-CM | POA: Diagnosis present

## 2023-10-29 DIAGNOSIS — S32462A Displaced associated transverse-posterior fracture of left acetabulum, initial encounter for closed fracture: Secondary | ICD-10-CM | POA: Diagnosis present

## 2023-10-29 DIAGNOSIS — R030 Elevated blood-pressure reading, without diagnosis of hypertension: Secondary | ICD-10-CM

## 2023-10-29 DIAGNOSIS — I10 Essential (primary) hypertension: Secondary | ICD-10-CM

## 2023-10-29 DIAGNOSIS — Z8249 Family history of ischemic heart disease and other diseases of the circulatory system: Secondary | ICD-10-CM | POA: Diagnosis not present

## 2023-10-29 DIAGNOSIS — R9431 Abnormal electrocardiogram [ECG] [EKG]: Secondary | ICD-10-CM | POA: Diagnosis not present

## 2023-10-29 DIAGNOSIS — T1490XA Injury, unspecified, initial encounter: Secondary | ICD-10-CM | POA: Diagnosis present

## 2023-10-29 DIAGNOSIS — S73015A Posterior dislocation of left hip, initial encounter: Secondary | ICD-10-CM | POA: Diagnosis present

## 2023-10-29 DIAGNOSIS — M797 Fibromyalgia: Secondary | ICD-10-CM | POA: Diagnosis present

## 2023-10-29 DIAGNOSIS — I517 Cardiomegaly: Secondary | ICD-10-CM | POA: Diagnosis not present

## 2023-10-29 DIAGNOSIS — M19072 Primary osteoarthritis, left ankle and foot: Secondary | ICD-10-CM | POA: Diagnosis not present

## 2023-10-29 DIAGNOSIS — F10929 Alcohol use, unspecified with intoxication, unspecified: Secondary | ICD-10-CM | POA: Diagnosis not present

## 2023-10-29 DIAGNOSIS — J984 Other disorders of lung: Secondary | ICD-10-CM | POA: Diagnosis not present

## 2023-10-29 DIAGNOSIS — S2221XA Fracture of manubrium, initial encounter for closed fracture: Secondary | ICD-10-CM | POA: Diagnosis present

## 2023-10-29 DIAGNOSIS — S2243XA Multiple fractures of ribs, bilateral, initial encounter for closed fracture: Secondary | ICD-10-CM | POA: Diagnosis present

## 2023-10-29 DIAGNOSIS — I4510 Unspecified right bundle-branch block: Secondary | ICD-10-CM | POA: Diagnosis present

## 2023-10-29 DIAGNOSIS — Z89511 Acquired absence of right leg below knee: Secondary | ICD-10-CM | POA: Diagnosis not present

## 2023-10-29 DIAGNOSIS — R918 Other nonspecific abnormal finding of lung field: Secondary | ICD-10-CM | POA: Diagnosis not present

## 2023-10-29 DIAGNOSIS — G4733 Obstructive sleep apnea (adult) (pediatric): Secondary | ICD-10-CM | POA: Diagnosis not present

## 2023-10-29 DIAGNOSIS — S2243XD Multiple fractures of ribs, bilateral, subsequent encounter for fracture with routine healing: Secondary | ICD-10-CM | POA: Diagnosis not present

## 2023-10-29 DIAGNOSIS — F319 Bipolar disorder, unspecified: Secondary | ICD-10-CM | POA: Diagnosis present

## 2023-10-29 DIAGNOSIS — I2489 Other forms of acute ischemic heart disease: Secondary | ICD-10-CM | POA: Diagnosis present

## 2023-10-29 DIAGNOSIS — F1910 Other psychoactive substance abuse, uncomplicated: Secondary | ICD-10-CM | POA: Diagnosis present

## 2023-10-29 DIAGNOSIS — S32402A Unspecified fracture of left acetabulum, initial encounter for closed fracture: Secondary | ICD-10-CM | POA: Diagnosis present

## 2023-10-29 DIAGNOSIS — R7401 Elevation of levels of liver transaminase levels: Secondary | ICD-10-CM | POA: Diagnosis present

## 2023-10-29 DIAGNOSIS — Z8616 Personal history of COVID-19: Secondary | ICD-10-CM | POA: Diagnosis not present

## 2023-10-29 DIAGNOSIS — Z7982 Long term (current) use of aspirin: Secondary | ICD-10-CM | POA: Diagnosis not present

## 2023-10-29 DIAGNOSIS — F17210 Nicotine dependence, cigarettes, uncomplicated: Secondary | ICD-10-CM | POA: Diagnosis present

## 2023-10-29 DIAGNOSIS — E669 Obesity, unspecified: Secondary | ICD-10-CM | POA: Diagnosis present

## 2023-10-29 DIAGNOSIS — R0989 Other specified symptoms and signs involving the circulatory and respiratory systems: Secondary | ICD-10-CM | POA: Diagnosis not present

## 2023-10-29 DIAGNOSIS — S270XXA Traumatic pneumothorax, initial encounter: Secondary | ICD-10-CM | POA: Diagnosis present

## 2023-10-29 DIAGNOSIS — S2249XB Multiple fractures of ribs, unspecified side, initial encounter for open fracture: Secondary | ICD-10-CM | POA: Diagnosis not present

## 2023-10-29 DIAGNOSIS — F32A Depression, unspecified: Secondary | ICD-10-CM | POA: Diagnosis not present

## 2023-10-29 DIAGNOSIS — Z7401 Bed confinement status: Secondary | ICD-10-CM | POA: Diagnosis not present

## 2023-10-29 DIAGNOSIS — S27321A Contusion of lung, unilateral, initial encounter: Secondary | ICD-10-CM | POA: Diagnosis present

## 2023-10-29 DIAGNOSIS — M79606 Pain in leg, unspecified: Secondary | ICD-10-CM | POA: Diagnosis not present

## 2023-10-29 DIAGNOSIS — Z6841 Body Mass Index (BMI) 40.0 and over, adult: Secondary | ICD-10-CM | POA: Diagnosis not present

## 2023-10-29 DIAGNOSIS — K5900 Constipation, unspecified: Secondary | ICD-10-CM | POA: Diagnosis not present

## 2023-10-29 DIAGNOSIS — Z4682 Encounter for fitting and adjustment of non-vascular catheter: Secondary | ICD-10-CM | POA: Diagnosis not present

## 2023-10-29 DIAGNOSIS — Z0189 Encounter for other specified special examinations: Secondary | ICD-10-CM | POA: Diagnosis not present

## 2023-10-29 DIAGNOSIS — Z833 Family history of diabetes mellitus: Secondary | ICD-10-CM | POA: Diagnosis not present

## 2023-10-29 DIAGNOSIS — M25552 Pain in left hip: Secondary | ICD-10-CM | POA: Diagnosis present

## 2023-10-29 DIAGNOSIS — S73005A Unspecified dislocation of left hip, initial encounter: Secondary | ICD-10-CM | POA: Diagnosis present

## 2023-10-29 DIAGNOSIS — Y908 Blood alcohol level of 240 mg/100 ml or more: Secondary | ICD-10-CM | POA: Diagnosis present

## 2023-10-29 DIAGNOSIS — J9811 Atelectasis: Secondary | ICD-10-CM | POA: Diagnosis not present

## 2023-10-29 DIAGNOSIS — D62 Acute posthemorrhagic anemia: Secondary | ICD-10-CM | POA: Diagnosis not present

## 2023-10-29 HISTORY — PX: HIP CLOSED REDUCTION: SHX983

## 2023-10-29 HISTORY — PX: INSERTION OF TRACTION PIN: SHX6560

## 2023-10-29 LAB — SURGICAL PCR SCREEN
MRSA, PCR: POSITIVE — AB
Staphylococcus aureus: POSITIVE — AB

## 2023-10-29 LAB — CBC
HCT: 29.4 % — ABNORMAL LOW (ref 36.0–46.0)
Hemoglobin: 8.7 g/dL — ABNORMAL LOW (ref 12.0–15.0)
MCH: 24.4 pg — ABNORMAL LOW (ref 26.0–34.0)
MCHC: 29.6 g/dL — ABNORMAL LOW (ref 30.0–36.0)
MCV: 82.6 fL (ref 80.0–100.0)
Platelets: 290 10*3/uL (ref 150–400)
RBC: 3.56 MIL/uL — ABNORMAL LOW (ref 3.87–5.11)
RDW: 18.2 % — ABNORMAL HIGH (ref 11.5–15.5)
WBC: 16.6 10*3/uL — ABNORMAL HIGH (ref 4.0–10.5)
nRBC: 0 % (ref 0.0–0.2)

## 2023-10-29 LAB — BASIC METABOLIC PANEL WITH GFR
Anion gap: 11 (ref 5–15)
BUN: 10 mg/dL (ref 6–20)
CO2: 19 mmol/L — ABNORMAL LOW (ref 22–32)
Calcium: 7.7 mg/dL — ABNORMAL LOW (ref 8.9–10.3)
Chloride: 109 mmol/L (ref 98–111)
Creatinine, Ser: 0.58 mg/dL (ref 0.44–1.00)
GFR, Estimated: >60 mL/min (ref >60)
Glucose, Bld: 121 mg/dL — ABNORMAL HIGH (ref 70–99)
Potassium: 3.3 mmol/L — ABNORMAL LOW (ref 3.5–5.1)
Sodium: 139 mmol/L (ref 135–145)

## 2023-10-29 LAB — TROPONIN I (HIGH SENSITIVITY)
Troponin I (High Sensitivity): 154 ng/L (ref <18)
Troponin I (High Sensitivity): 218 ng/L (ref <18)
Troponin I (High Sensitivity): 314 ng/L (ref <18)
Troponin I (High Sensitivity): 327 ng/L (ref <18)
Troponin I (High Sensitivity): 170 ng/L (ref <18)

## 2023-10-29 LAB — HIV ANTIBODY (ROUTINE TESTING W REFLEX): HIV Screen 4th Generation wRfx: NONREACTIVE

## 2023-10-29 SURGERY — CLOSED REDUCTION, HIP
Anesthesia: General | Site: Leg Lower | Laterality: Left

## 2023-10-29 MED ORDER — POLYETHYLENE GLYCOL 3350 17 G PO PACK
17.0000 g | PACK | Freq: Every day | ORAL | Status: DC | PRN
Start: 2023-10-29 — End: 2023-11-01

## 2023-10-29 MED ORDER — ONDANSETRON HCL 4 MG/2ML IJ SOLN
4.0000 mg | Freq: Four times a day (QID) | INTRAMUSCULAR | Status: DC | PRN
  Administered 2023-10-31: 4 mg via INTRAVENOUS
  Filled 2023-10-29: qty 2

## 2023-10-29 MED ORDER — METHOCARBAMOL 1000 MG/10ML IJ SOLN
500.0000 mg | Freq: Three times a day (TID) | INTRAMUSCULAR | Status: DC
  Administered 2023-10-29: 500 mg via INTRAVENOUS
  Filled 2023-10-29: qty 10

## 2023-10-29 MED ORDER — MIDAZOLAM HCL 2 MG/2ML IJ SOLN
INTRAMUSCULAR | Status: DC | PRN
  Administered 2023-10-29: 2 mg via INTRAVENOUS

## 2023-10-29 MED ORDER — ORAL CARE MOUTH RINSE: 15.0000 mL | Freq: Once | OROMUCOSAL | Status: AC

## 2023-10-29 MED ORDER — GABAPENTIN 300 MG PO CAPS
300.0000 mg | ORAL_CAPSULE | Freq: Three times a day (TID) | ORAL | Status: AC
Start: 2023-10-29 — End: ?
  Administered 2023-10-29 – 2023-11-01 (×10): 300 mg via ORAL
  Filled 2023-10-29 (×10): qty 1

## 2023-10-29 MED ORDER — AMISULPRIDE (ANTIEMETIC) 5 MG/2ML IV SOLN: 10.0000 mg | Freq: Once | INTRAVENOUS | Status: DC | PRN

## 2023-10-29 MED ORDER — FENTANYL CITRATE (PF) 100 MCG/2ML IJ SOLN
25.0000 ug | INTRAMUSCULAR | Status: DC | PRN
  Administered 2023-10-29 (×2): 50 ug via INTRAVENOUS

## 2023-10-29 MED ORDER — LORAZEPAM 1 MG PO TABS
1.0000 mg | ORAL_TABLET | ORAL | Status: AC | PRN
  Administered 2023-10-30 – 2023-10-31 (×5): 1 mg via ORAL
  Filled 2023-10-29 (×5): qty 1

## 2023-10-29 MED ORDER — LIDOCAINE 2% (20 MG/ML) 5 ML SYRINGE
INTRAMUSCULAR | Status: AC
  Filled 2023-10-29: qty 5

## 2023-10-29 MED ORDER — THIAMINE HCL 100 MG/ML IJ SOLN
100.0000 mg | Freq: Every day | INTRAMUSCULAR | Status: DC
Start: 2023-10-29 — End: 2023-11-01
  Filled 2023-10-29: qty 2

## 2023-10-29 MED ORDER — METHOCARBAMOL 500 MG PO TABS
500.0000 mg | ORAL_TABLET | Freq: Four times a day (QID) | ORAL | Status: DC | PRN
  Administered 2023-10-30 – 2023-10-31 (×2): 500 mg via ORAL
  Filled 2023-10-29 (×2): qty 1

## 2023-10-29 MED ORDER — MIDAZOLAM HCL 2 MG/2ML IJ SOLN
INTRAMUSCULAR | Status: AC
  Filled 2023-10-29: qty 2

## 2023-10-29 MED ORDER — CHLORHEXIDINE GLUCONATE 4 % EX SOLN: 1.0000 | CUTANEOUS | 1 refills | Status: DC

## 2023-10-29 MED ORDER — LACTATED RINGERS IV SOLN: INTRAVENOUS | Status: DC

## 2023-10-29 MED ORDER — MUPIROCIN 2 % EX OINT: 1.0000 | TOPICAL_OINTMENT | Freq: Two times a day (BID) | CUTANEOUS | 0 refills | Status: AC

## 2023-10-29 MED ORDER — ONDANSETRON HCL 4 MG/2ML IJ SOLN
INTRAMUSCULAR | Status: DC | PRN
  Administered 2023-10-29: 4 mg via INTRAVENOUS

## 2023-10-29 MED ORDER — ROCURONIUM BROMIDE 10 MG/ML (PF) SYRINGE
PREFILLED_SYRINGE | INTRAVENOUS | Status: AC
  Filled 2023-10-29: qty 10

## 2023-10-29 MED ORDER — CHLORHEXIDINE GLUCONATE 0.12 % MT SOLN
OROMUCOSAL | Status: AC
  Administered 2023-10-29: 15 mL via OROMUCOSAL
  Filled 2023-10-29: qty 15

## 2023-10-29 MED ORDER — LIDOCAINE HCL (CARDIAC) PF 100 MG/5ML IV SOSY
PREFILLED_SYRINGE | INTRAVENOUS | Status: DC | PRN
  Administered 2023-10-29: 80 mg via INTRAVENOUS

## 2023-10-29 MED ORDER — ADULT MULTIVITAMIN W/MINERALS CH
1.0000 | ORAL_TABLET | Freq: Every day | ORAL | Status: DC
  Administered 2023-10-30 – 2023-11-15 (×17): 1 via ORAL
  Filled 2023-10-29 (×17): qty 1

## 2023-10-29 MED ORDER — METHOCARBAMOL 1000 MG/10ML IJ SOLN: 500.0000 mg | Freq: Four times a day (QID) | INTRAMUSCULAR | Status: DC | PRN

## 2023-10-29 MED ORDER — ACETAMINOPHEN 500 MG PO TABS
1000.0000 mg | ORAL_TABLET | Freq: Four times a day (QID) | ORAL | Status: DC
  Administered 2023-10-29 – 2023-11-15 (×58): 1000 mg via ORAL
  Filled 2023-10-29 (×66): qty 2

## 2023-10-29 MED ORDER — OXYCODONE HCL 5 MG PO TABS
5.0000 mg | ORAL_TABLET | ORAL | Status: DC | PRN
  Administered 2023-10-29 – 2023-10-31 (×11): 10 mg via ORAL
  Filled 2023-10-29 (×11): qty 2

## 2023-10-29 MED ORDER — FENTANYL CITRATE (PF) 250 MCG/5ML IJ SOLN
INTRAMUSCULAR | Status: AC
  Filled 2023-10-29: qty 5

## 2023-10-29 MED ORDER — CHLORHEXIDINE GLUCONATE 0.12 % MT SOLN: 15.0000 mL | Freq: Once | OROMUCOSAL | Status: AC

## 2023-10-29 MED ORDER — DEXAMETHASONE SODIUM PHOSPHATE 10 MG/ML IJ SOLN
INTRAMUSCULAR | Status: DC | PRN
  Administered 2023-10-29: 8 mg via INTRAVENOUS

## 2023-10-29 MED ORDER — PROPOFOL 10 MG/ML IV BOLUS
INTRAVENOUS | Status: DC | PRN
  Administered 2023-10-29: 150 mg via INTRAVENOUS

## 2023-10-29 MED ORDER — FOLIC ACID 1 MG PO TABS
1.0000 mg | ORAL_TABLET | Freq: Every day | ORAL | Status: DC
  Administered 2023-10-30 – 2023-11-15 (×17): 1 mg via ORAL
  Filled 2023-10-29 (×17): qty 1

## 2023-10-29 MED ORDER — ONDANSETRON 4 MG PO TBDP: 4.0000 mg | ORAL_TABLET | Freq: Four times a day (QID) | ORAL | Status: DC | PRN

## 2023-10-29 MED ORDER — ALBUTEROL SULFATE HFA 108 (90 BASE) MCG/ACT IN AERS
INHALATION_SPRAY | RESPIRATORY_TRACT | Status: AC
  Filled 2023-10-29: qty 6.7

## 2023-10-29 MED ORDER — HYDRALAZINE HCL 20 MG/ML IJ SOLN: 10.0000 mg | INTRAMUSCULAR | Status: DC | PRN

## 2023-10-29 MED ORDER — ENOXAPARIN SODIUM 30 MG/0.3ML IJ SOSY
30.0000 mg | PREFILLED_SYRINGE | Freq: Two times a day (BID) | INTRAMUSCULAR | Status: DC
  Administered 2023-10-30 – 2023-10-31 (×3): 30 mg via SUBCUTANEOUS
  Filled 2023-10-29 (×3): qty 0.3

## 2023-10-29 MED ORDER — ONDANSETRON HCL 4 MG/2ML IJ SOLN
INTRAMUSCULAR | Status: AC
  Filled 2023-10-29: qty 2

## 2023-10-29 MED ORDER — LACTATED RINGERS IV SOLN: INTRAVENOUS | Status: AC

## 2023-10-29 MED ORDER — ROCURONIUM BROMIDE 10 MG/ML (PF) SYRINGE
PREFILLED_SYRINGE | INTRAVENOUS | Status: DC | PRN
  Administered 2023-10-29 (×2): 40 mg via INTRAVENOUS

## 2023-10-29 MED ORDER — METHOCARBAMOL 500 MG PO TABS
500.0000 mg | ORAL_TABLET | Freq: Three times a day (TID) | ORAL | Status: DC
  Administered 2023-10-29 – 2023-10-30 (×4): 500 mg via ORAL
  Filled 2023-10-29 (×3): qty 1

## 2023-10-29 MED ORDER — PROPOFOL 10 MG/ML IV BOLUS
INTRAVENOUS | Status: AC
  Filled 2023-10-29: qty 20

## 2023-10-29 MED ORDER — OXYCODONE HCL 5 MG PO TABS: 5.0000 mg | ORAL_TABLET | Freq: Once | ORAL | Status: DC | PRN

## 2023-10-29 MED ORDER — DEXAMETHASONE SODIUM PHOSPHATE 10 MG/ML IJ SOLN
INTRAMUSCULAR | Status: AC
  Filled 2023-10-29: qty 1

## 2023-10-29 MED ORDER — HYDROMORPHONE HCL 1 MG/ML IJ SOLN
1.0000 mg | INTRAMUSCULAR | Status: DC | PRN
  Administered 2023-10-29 – 2023-11-01 (×12): 1 mg via INTRAVENOUS
  Filled 2023-10-29 (×12): qty 1

## 2023-10-29 MED ORDER — METHOCARBAMOL 500 MG PO TABS
ORAL_TABLET | ORAL | Status: AC
  Filled 2023-10-29: qty 1

## 2023-10-29 MED ORDER — THIAMINE MONONITRATE 100 MG PO TABS
100.0000 mg | ORAL_TABLET | Freq: Every day | ORAL | Status: DC
  Administered 2023-10-30 – 2023-11-15 (×17): 100 mg via ORAL
  Filled 2023-10-29 (×17): qty 1

## 2023-10-29 MED ORDER — DOCUSATE SODIUM 100 MG PO CAPS
100.0000 mg | ORAL_CAPSULE | Freq: Two times a day (BID) | ORAL | Status: DC
  Administered 2023-10-29 – 2023-11-15 (×33): 100 mg via ORAL
  Filled 2023-10-29 (×35): qty 1

## 2023-10-29 MED ORDER — FENTANYL CITRATE (PF) 250 MCG/5ML IJ SOLN
INTRAMUSCULAR | Status: DC | PRN
  Administered 2023-10-29: 50 ug via INTRAVENOUS
  Administered 2023-10-29 (×2): 100 ug via INTRAVENOUS

## 2023-10-29 MED ORDER — METOCLOPRAMIDE HCL 5 MG/ML IJ SOLN
5.0000 mg | Freq: Three times a day (TID) | INTRAMUSCULAR | Status: DC | PRN
  Administered 2023-10-30: 10 mg via INTRAVENOUS
  Filled 2023-10-29: qty 2

## 2023-10-29 MED ORDER — SUGAMMADEX SODIUM 200 MG/2ML IV SOLN
INTRAVENOUS | Status: DC | PRN
  Administered 2023-10-29: 400 mg via INTRAVENOUS

## 2023-10-29 MED ORDER — FENTANYL CITRATE (PF) 100 MCG/2ML IJ SOLN
INTRAMUSCULAR | Status: AC
  Filled 2023-10-29: qty 2

## 2023-10-29 MED ORDER — ALBUTEROL SULFATE HFA 108 (90 BASE) MCG/ACT IN AERS
INHALATION_SPRAY | RESPIRATORY_TRACT | Status: DC | PRN
  Administered 2023-10-29: 4 via RESPIRATORY_TRACT

## 2023-10-29 MED ORDER — METOCLOPRAMIDE HCL 5 MG PO TABS
5.0000 mg | ORAL_TABLET | Freq: Three times a day (TID) | ORAL | Status: DC | PRN
  Filled 2023-10-29: qty 2

## 2023-10-29 MED ORDER — OXYCODONE HCL 5 MG/5ML PO SOLN: 5.0000 mg | Freq: Once | ORAL | Status: DC | PRN

## 2023-10-29 MED ORDER — ACETAMINOPHEN 500 MG PO TABS: 1000.0000 mg | ORAL_TABLET | Freq: Once | ORAL | Status: DC

## 2023-10-29 SURGICAL SUPPLY — 49 items
BAG COUNTER SPONGE SURGICOUNT (BAG) IMPLANT
BLADE CLIPPER SURG (BLADE) IMPLANT
BLADE SURG 11 STRL SS (BLADE) ×2 IMPLANT
BNDG GAUZE DERMACEA FLUFF 4 (GAUZE/BANDAGES/DRESSINGS) ×1 IMPLANT
BRUSH SCRUB EZ PLAIN DRY (MISCELLANEOUS) ×4 IMPLANT
CHLORAPREP W/TINT 26 (MISCELLANEOUS) ×5 IMPLANT
COVER SURGICAL LIGHT HANDLE (MISCELLANEOUS) ×3 IMPLANT
DRAPE C-ARM 42X72 X-RAY (DRAPES) ×3 IMPLANT
DRAPE C-ARMOR (DRAPES) ×3 IMPLANT
DRAPE INCISE IOBAN 85X60 (DRAPES) ×3 IMPLANT
DRAPE SURG 17X23 STRL (DRAPES) ×18 IMPLANT
DRAPE SURG ORHT 6 SPLT 77X108 (DRAPES) ×6 IMPLANT
DRAPE U-SHAPE 47X51 STRL (DRAPES) ×3 IMPLANT
DRSG MEPILEX POST OP 4X12 (GAUZE/BANDAGES/DRESSINGS) IMPLANT
DRSG MEPILEX POST OP 4X8 (GAUZE/BANDAGES/DRESSINGS) IMPLANT
ELECT BLADE 6.5 EXT (BLADE) IMPLANT
ELECTRODE BLDE 4.0 EZ CLN MEGD (MISCELLANEOUS) ×3 IMPLANT
ELECTRODE REM PT RTRN 9FT ADLT (ELECTROSURGICAL) ×3 IMPLANT
GLOVE BIO SURGEON STRL SZ 6.5 (GLOVE) ×9 IMPLANT
GLOVE BIO SURGEON STRL SZ7.5 (GLOVE) ×12 IMPLANT
GLOVE BIOGEL PI IND STRL 6.5 (GLOVE) ×3 IMPLANT
GLOVE BIOGEL PI IND STRL 7.5 (GLOVE) ×3 IMPLANT
GOWN STRL REUS W/ TWL LRG LVL3 (GOWN DISPOSABLE) ×6 IMPLANT
KIT BASIN OR (CUSTOM PROCEDURE TRAY) ×3 IMPLANT
KIT TURNOVER KIT B (KITS) ×3 IMPLANT
MANIFOLD NEPTUNE II (INSTRUMENTS) ×3 IMPLANT
NS IRRIG 1000ML POUR BTL (IV SOLUTION) ×3 IMPLANT
PACK TOTAL JOINT (CUSTOM PROCEDURE TRAY) ×3 IMPLANT
PACK UNIVERSAL I (CUSTOM PROCEDURE TRAY) ×3 IMPLANT
PAD ARMBOARD POSITIONER FOAM (MISCELLANEOUS) ×6 IMPLANT
ROPE TRACTION 120 (SOFTGOODS) ×1 IMPLANT
SET HNDPC FAN SPRY TIP SCT (DISPOSABLE) ×3 IMPLANT
SOLUTION ANTFG W/FOAM PAD STRL (MISCELLANEOUS) ×3 IMPLANT
SPONGE T-LAP 18X18 ~~LOC~~+RFID (SPONGE) IMPLANT
STAPLER SKIN PROX 35W (STAPLE) IMPLANT
SUCTION TUBE FRAZIER 10FR DISP (SUCTIONS) ×3 IMPLANT
SUT ETHILON 2 0 PSLX (SUTURE) ×6 IMPLANT
SUT MNCRL AB 3-0 PS2 18 (SUTURE) ×3 IMPLANT
SUT MON AB 2-0 CT1 36 (SUTURE) ×3 IMPLANT
SUT VIC AB 0 CT1 27XBRD ANBCTR (SUTURE) ×3 IMPLANT
SUT VIC AB 1 CT1 18XCR BRD 8 (SUTURE) ×3 IMPLANT
SUT VIC AB 1 CT1 27XBRD ANBCTR (SUTURE) ×3 IMPLANT
SUT VIC AB 2-0 CT1 TAPERPNT 27 (SUTURE) ×3 IMPLANT
SUTURE FIBERWR #2 38 T-5 BLUE (SUTURE) IMPLANT
TOWEL GREEN STERILE (TOWEL DISPOSABLE) ×6 IMPLANT
TOWEL GREEN STERILE FF (TOWEL DISPOSABLE) ×6 IMPLANT
TRAY FOLEY MTR SLVR 16FR STAT (SET/KITS/TRAYS/PACK) IMPLANT
UNDERPAD 30X36 HEAVY ABSORB (UNDERPADS AND DIAPERS) ×3 IMPLANT
WATER STERILE IRR 1000ML POUR (IV SOLUTION) IMPLANT
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
## 2023-10-29 NOTE — Progress Notes (Signed)
 Orthopedic Tech Progress Note Patient Details:  Taylor [name] November 04, 1990 [id number]  Patient ID: [name], female   DOB: April 15, 1991, 33 y.o.   MRN: [id number] No OHF. Weight [name] 10/29/2023, 2:55 PM

## 2023-10-29 NOTE — Transfer of Care (Signed)
 Immediate Anesthesia Transfer of Care Note  Patient: Taylor [name]  Procedure(s) Performed: CLOSED REDUCTION, HIP (Left: Hip) INSERTION, TRACTION PIN (Left: Leg Lower)  Patient Location: PACU  Anesthesia Type:General  Level of Consciousness: awake, alert , oriented, and patient cooperative  Airway & Oxygen Therapy: Patient Spontanous Breathing  Post-op Assessment: Report given to RN and Post -op Vital signs reviewed and stable  Post vital signs: Reviewed and stable  Last Vitals:  Vitals Value Taken Time  BP 138/87 10/29/23 11:54  Temp    Pulse 93 10/29/23 11:58  Resp 15 10/29/23 11:58  SpO2 96 % 10/29/23 11:58  Vitals shown include unfiled device data.  Last Pain:  Vitals:   10/29/23 1012  TempSrc:   PainSc: 10-Worst pain ever         Complications: No notable events documented.

## 2023-10-29 NOTE — Interval H&P Note (Signed)
 History and Physical Interval Note:  10/29/2023 11:07 AM  Taylor [name]  has presented today for surgery, with the diagnosis of Left acetabular fracture.  The various methods of treatment have been discussed with the patient and family. After consideration of risks, benefits and other options for treatment, the patient has consented to  Procedure(s): OPEN REDUCTION INTERNAL FIXATION ACETABULUM FRACTURE POSTERIOR (Left) as a surgical intervention.  The patient's history has been reviewed, patient examined, no change in status, stable for surgery.  I have reviewed the patient's chart and labs.  Questions were answered to the patient's satisfaction.     Pedram Goodchild P Kellin Bartling
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
## 2023-10-29 NOTE — Progress Notes (Signed)
 Orthopedic Tech Progress Note Patient Details:  Taylor [name] [birth year] [id number]  Musculoskeletal Traction Type of Traction: Bucks Skin Traction Traction Location: LLE Traction Weight: 15 lbs   Post Interventions Patient Tolerated: Well Instructions Provided: Care of device There were some minor adjustments to the bed that needed to be made. The end of the bed frame is a bit unstable that caused the traction to bend forward but once I was finished ,I was able to straighten it back in place. Patient is stable and aware of surgery in the AM.  [name] L Korver Graybeal 10/29/2023, 2:24 AM
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
## 2023-10-29 NOTE — Anesthesia Procedure Notes (Signed)
 Procedure Name: Intubation Date/Time: 10/29/2023 11:19 AM  Performed by: Grier Leber, CRNAPre-anesthesia Checklist: Patient identified, Emergency Drugs available, Suction available and Patient being monitored Patient Re-evaluated:Patient Re-evaluated prior to induction Oxygen Delivery Method: Circle System Utilized Preoxygenation: Pre-oxygenation with 100% oxygen Induction Type: IV induction Ventilation: Mask ventilation without difficulty Laryngoscope Size: Mac and 3 Grade View: Grade I Tube type: Oral Tube size: 7.0 mm Number of attempts: 1 Airway Equipment and Method: Stylet Placement Confirmation: ETT inserted through vocal cords under direct vision, positive ETCO2 and breath sounds checked- equal and bilateral Secured at: 22 cm Tube secured with: Tape Dental Injury: Teeth and Oropharynx as per pre-operative assessment

## 2023-10-29 NOTE — Progress Notes (Signed)
 SLP Cancellation Note  Patient Details Name: Taylor [name] MRN: [id number] DOB: September 06, 1990   Cancelled treatment:       Reason Eval/Treat Not Completed: Patient at procedure or test/unavailable. SLP will f/u.    Amil Kale, M.A., CCC-SLP Speech Language Pathology, Acute Rehabilitation Services  Secure Chat preferred [phone]  10/29/2023, 9:55 AM

## 2023-10-30 ENCOUNTER — Inpatient Hospital Stay (HOSPITAL_COMMUNITY)

## 2023-10-30 ENCOUNTER — Encounter (HOSPITAL_COMMUNITY): Payer: Self-pay

## 2023-10-30 DIAGNOSIS — F10929 Alcohol use, unspecified with intoxication, unspecified: Secondary | ICD-10-CM | POA: Diagnosis not present

## 2023-10-30 DIAGNOSIS — R7989 Other specified abnormal findings of blood chemistry: Secondary | ICD-10-CM

## 2023-10-30 DIAGNOSIS — R9431 Abnormal electrocardiogram [ECG] [EKG]: Secondary | ICD-10-CM | POA: Diagnosis not present

## 2023-10-30 DIAGNOSIS — R030 Elevated blood-pressure reading, without diagnosis of hypertension: Secondary | ICD-10-CM | POA: Diagnosis not present

## 2023-10-30 DIAGNOSIS — R768 Other specified abnormal immunological findings in serum: Secondary | ICD-10-CM | POA: Diagnosis present

## 2023-10-30 DIAGNOSIS — Z01810 Encounter for preprocedural cardiovascular examination: Secondary | ICD-10-CM

## 2023-10-30 LAB — BASIC METABOLIC PANEL WITH GFR
Anion gap: 6 (ref 5–15)
BUN: 7 mg/dL (ref 6–20)
CO2: 26 mmol/L (ref 22–32)
Calcium: 8.7 mg/dL — ABNORMAL LOW (ref 8.9–10.3)
Chloride: 105 mmol/L (ref 98–111)
Creatinine, Ser: 0.49 mg/dL (ref 0.44–1.00)
GFR, Estimated: >60 mL/min (ref >60)
Glucose, Bld: 122 mg/dL — ABNORMAL HIGH (ref 70–99)
Potassium: 3.6 mmol/L (ref 3.5–5.1)
Sodium: 137 mmol/L (ref 135–145)

## 2023-10-30 LAB — ECHOCARDIOGRAM COMPLETE
AR max vel: 2.59 cm2
AV Area VTI: 2.55 cm2
AV Area mean vel: 2.7 cm2
AV Mean grad: 5 mmHg
AV Peak grad: 10.8 mmHg
Ao pk vel: 1.64 m/s
Height: 64 in
S' Lateral: 2.8 cm
Single Plane A4C EF: 70.5 %
Weight: 4160 [oz_av]

## 2023-10-30 LAB — RAPID URINE DRUG SCREEN, HOSP PERFORMED
Amphetamines: NOT DETECTED
Barbiturates: NOT DETECTED
Benzodiazepines: POSITIVE — AB
Cocaine: POSITIVE — AB
Opiates: POSITIVE — AB
Tetrahydrocannabinol: NOT DETECTED

## 2023-10-30 LAB — CBC
HCT: 27.5 % — ABNORMAL LOW (ref 36.0–46.0)
Hemoglobin: 8.2 g/dL — ABNORMAL LOW (ref 12.0–15.0)
MCH: 24.1 pg — ABNORMAL LOW (ref 26.0–34.0)
MCHC: 29.8 g/dL — ABNORMAL LOW (ref 30.0–36.0)
MCV: 80.9 fL (ref 80.0–100.0)
Platelets: 243 10*3/uL (ref 150–400)
RBC: 3.4 MIL/uL — ABNORMAL LOW (ref 3.87–5.11)
RDW: 18 % — ABNORMAL HIGH (ref 11.5–15.5)
WBC: 12.3 10*3/uL — ABNORMAL HIGH (ref 4.0–10.5)
nRBC: 0 % (ref 0.0–0.2)

## 2023-10-30 LAB — HEPATITIS PANEL, ACUTE
HCV Ab: REACTIVE — AB
Hep A IgM: NONREACTIVE
Hep B C IgM: NONREACTIVE
Hepatitis B Surface Ag: NONREACTIVE

## 2023-10-30 MED ORDER — THIAMINE HCL 100 MG/ML IJ SOLN: 100.0000 mg | Freq: Every day | INTRAMUSCULAR | Status: DC

## 2023-10-30 MED ORDER — LORAZEPAM 1 MG PO TABS: 1.0000 mg | ORAL_TABLET | ORAL | Status: DC | PRN

## 2023-10-30 MED ORDER — HYDROMORPHONE HCL 1 MG/ML IJ SOLN
1.0000 mg | Freq: Once | INTRAMUSCULAR | Status: AC
  Administered 2023-10-30: 1 mg via INTRAVENOUS
  Filled 2023-10-30: qty 1

## 2023-10-30 MED ORDER — NICOTINE 21 MG/24HR TD PT24
21.0000 mg | MEDICATED_PATCH | Freq: Every day | TRANSDERMAL | Status: DC
  Administered 2023-10-30: 21 mg via TRANSDERMAL
  Filled 2023-10-30: qty 1

## 2023-10-30 MED ORDER — LORAZEPAM 2 MG/ML IJ SOLN
1.0000 mg | INTRAMUSCULAR | Status: AC | PRN
  Administered 2023-10-30 – 2023-11-02 (×4): 2 mg via INTRAVENOUS
  Filled 2023-10-30 (×3): qty 1

## 2023-10-30 MED ORDER — PERFLUTREN LIPID MICROSPHERE
1.0000 mL | INTRAVENOUS | Status: AC | PRN
  Administered 2023-10-30: 2 mL via INTRAVENOUS

## 2023-10-30 MED ORDER — THIAMINE MONONITRATE 100 MG PO TABS: 100.0000 mg | ORAL_TABLET | Freq: Every day | ORAL | Status: DC

## 2023-10-30 MED ORDER — ADULT MULTIVITAMIN W/MINERALS CH: 1.0000 | ORAL_TABLET | Freq: Every day | ORAL | Status: DC

## 2023-10-30 MED ORDER — FOLIC ACID 1 MG PO TABS: 1.0000 mg | ORAL_TABLET | Freq: Every day | ORAL | Status: DC

## 2023-10-30 NOTE — Progress Notes (Signed)
 Orthopedic Tech Progress Note Patient Details:  Taylor [name] June 21, 1990 [id number]  Ortho Devices Type of Ortho Device: Prafo boot/shoe Ortho Device/Splint Location: LLE Ortho Device/Splint Interventions: Ordered   Post Interventions Patient Tolerated: Well Instructions Provided: Care of device  [name] 10/30/2023, 4:42 PM

## 2023-10-30 NOTE — Progress Notes (Signed)
*  PRELIMINARY RESULTS* Echocardiogram 2D Echocardiogram has been performed.  Taylor [name] 10/30/2023, 3:47 PM
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
## 2023-10-30 NOTE — Plan of Care (Signed)
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
## 2023-10-30 NOTE — Evaluation (Signed)
 SLP Cancellation Note  Patient Details Name: KENDRIA HALBERG MRN: [id number] DOB: 06-08-1990   Cancelled treatment:       Reason Eval/Treat Not Completed: Other (comment) (pt undergoing chest tube placement at this time; will continue efforts)  Madelin POUR, MS [facility] SLP Acute Rehab Services Office [phone]  Nicolas Emmie Caldron 10/30/2023, 12:49 PM

## 2023-10-30 NOTE — Plan of Care (Signed)
   Problem: Education: Goal: Knowledge of General Education information will improve Description: Including pain rating scale, medication(s)/side effects and non-pharmacologic comfort measures Outcome: Progressing   Problem: Health Behavior/Discharge Planning: Goal: Ability to manage health-related needs will improve Outcome: Progressing   Problem: Nutrition: Goal: Adequate nutrition will be maintained Outcome: Progressing
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
## 2023-10-30 NOTE — Procedures (Signed)
   Procedure Note  Date: 10/30/2023  Procedure: tube thoracostomy--left    Pre-op diagnosis: left pneumothorax  Post-op diagnosis: same  Surgeon: Cordella Idler, MD  Anesthesia: local   EBL: <5cc procedural Drains/Implants: 6F chest tube Specimen: none  Description of procedure: Time-out was performed verifying correct patient, procedure, site, laterality, and signature of informed consent. 5 mL of local anesthetic was infiltrated into the tissues just over the fifth intercostal space.  A small skin nick was made at the fifth intercostal space. An introducer needle was inserted and a guidewire inserted through the needle. The needle was removed and the tract dilated. The chest tube was inserted over the guidewire and the guidewire removed.   The tube was secured at the skin with suture and connected to an atrium at -20cm water  wall suction. Immediate output from the chest tube was air with minimal fluid. The site was dressed. The patient tolerated the procedure well. There were no complications. Follow up chest x-ray was ordered to confirm tube positioning, complete evacuation, and complete lung re-expansion.    Cordella Idler, MD General and Trauma Surgery [facility] Surgery

## 2023-10-31 ENCOUNTER — Inpatient Hospital Stay (HOSPITAL_COMMUNITY)

## 2023-10-31 LAB — CBC
HCT: 27.8 % — ABNORMAL LOW (ref 36.0–46.0)
Hemoglobin: 8.4 g/dL — ABNORMAL LOW (ref 12.0–15.0)
MCH: 24.3 pg — ABNORMAL LOW (ref 26.0–34.0)
MCHC: 30.2 g/dL (ref 30.0–36.0)
MCV: 80.3 fL (ref 80.0–100.0)
Platelets: 238 10*3/uL (ref 150–400)
RBC: 3.46 MIL/uL — ABNORMAL LOW (ref 3.87–5.11)
RDW: 18.8 % — ABNORMAL HIGH (ref 11.5–15.5)
WBC: 11.8 10*3/uL — ABNORMAL HIGH (ref 4.0–10.5)
nRBC: 0 % (ref 0.0–0.2)

## 2023-10-31 LAB — BASIC METABOLIC PANEL WITH GFR
Anion gap: 9 (ref 5–15)
BUN: 9 mg/dL (ref 6–20)
CO2: 23 mmol/L (ref 22–32)
Calcium: 8.4 mg/dL — ABNORMAL LOW (ref 8.9–10.3)
Chloride: 104 mmol/L (ref 98–111)
Creatinine, Ser: 0.61 mg/dL (ref 0.44–1.00)
GFR, Estimated: >60 mL/min (ref >60)
Glucose, Bld: 97 mg/dL (ref 70–99)
Potassium: 3.8 mmol/L (ref 3.5–5.1)
Sodium: 136 mmol/L (ref 135–145)

## 2023-10-31 LAB — HCV RNA QUANT RFLX ULTRA OR GENOTYP
HCV RNA Qnt(log copy/mL): UNDETERMINED {Log_IU}/mL
HepC Qn: NOT DETECTED [IU]/mL

## 2023-10-31 MED ORDER — CEFAZOLIN SODIUM-DEXTROSE 2-4 GM/100ML-% IV SOLN
2.0000 g | INTRAVENOUS | Status: AC
  Administered 2023-11-01: 2 g via INTRAVENOUS
  Filled 2023-10-31: qty 100

## 2023-10-31 MED ORDER — TRANEXAMIC ACID-NACL 1000-0.7 MG/100ML-% IV SOLN
1000.0000 mg | INTRAVENOUS | Status: AC
  Administered 2023-11-01: 1000 mg via INTRAVENOUS
  Filled 2023-10-31 (×2): qty 100

## 2023-10-31 MED ORDER — ENOXAPARIN SODIUM 30 MG/0.3ML IJ SOSY
30.0000 mg | PREFILLED_SYRINGE | Freq: Two times a day (BID) | INTRAMUSCULAR | Status: DC
  Administered 2023-11-02 – 2023-11-15 (×28): 30 mg via SUBCUTANEOUS
  Filled 2023-10-31 (×28): qty 0.3
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
## 2023-10-31 NOTE — Progress Notes (Signed)
 Transition of Care [facility]) - CAGE-AID Screening   Patient Details  Name: Taylor [name] MRN: [id number] Date of Birth: 05/09/1991  Darice CHRISTELLA Rouleau, RN Trauma Response Nurse Phone Number: [phone] 10/31/2023, 4:54 PM   Clinical Narrative:  This TRN completed pt's Cage Aid in March - her prior admission- at that time she denied drinking/drugs. On admission now- her ETOH was positive at 289. Pt states that she has been drinking heavily daily and has gone through withdrawals. CIWA started.   CAGE-AID Screening:    Have You Ever Felt You Ought to Cut Down on Your Drinking or Drug Use?: Yes Have People Annoyed You By Critizing Your Drinking Or Drug Use?: Yes Have You Felt Bad Or Guilty About Your Drinking Or Drug Use?: Yes Have You Ever Had a Drink or Used Drugs First Thing In The Morning to Steady Your Nerves or to Get Rid of a Hangover?: Yes CAGE-AID Score: 4  Substance Abuse Education Offered: (S) Yes (please give information at discharge)
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
## 2023-10-31 NOTE — Anesthesia Postprocedure Evaluation (Signed)
 Anesthesia Post Note  Patient: Taylor [name]  Procedure(s) Performed: CLOSED REDUCTION, HIP (Left: Hip) INSERTION, TRACTION PIN (Left: Leg Lower)     Patient location during evaluation: PACU Anesthesia Type: General Level of consciousness: awake and alert Pain management: pain level controlled Vital Signs Assessment: post-procedure vital signs reviewed and stable Respiratory status: spontaneous breathing, nonlabored ventilation, respiratory function stable and patient connected to nasal cannula oxygen Cardiovascular status: blood pressure returned to baseline and stable Postop Assessment: no apparent nausea or vomiting Anesthetic complications: no  No notable events documented.  Last Vitals:  Vitals:   10/31/23 1353 10/31/23 1751  BP: 138/76 126/82  Pulse: (!) 108 (!) 104  Resp: 20 16  Temp: 37 C 37 C  SpO2: 96% 96%    Last Pain:  Vitals:   10/31/23 1808  TempSrc:   PainSc: 10-Worst pain ever                 Careli Luzader L Diron Haddon

## 2023-10-31 NOTE — Progress Notes (Signed)
 SLP Cancellation Note  Patient Details Name: Taylor [name] MRN: [id number] DOB: [birth year]   Cancelled treatment:       Reason Eval/Treat Not Completed: Patient at procedure or test/unavailable   Pat [name],M.S.,CCC-SLP 10/31/2023, 12:17 PM

## 2023-11-01 ENCOUNTER — Other Ambulatory Visit: Payer: Self-pay

## 2023-11-01 ENCOUNTER — Inpatient Hospital Stay (HOSPITAL_COMMUNITY)

## 2023-11-01 ENCOUNTER — Encounter (HOSPITAL_COMMUNITY): Payer: Self-pay

## 2023-11-01 ENCOUNTER — Inpatient Hospital Stay (HOSPITAL_COMMUNITY): Admitting: Anesthesiology

## 2023-11-01 ENCOUNTER — Encounter (HOSPITAL_COMMUNITY): Admission: EM | Disposition: A | Discharge status: Home Health | Payer: Self-pay | Source: Home / Self Care

## 2023-11-01 DIAGNOSIS — J45909 Unspecified asthma, uncomplicated: Secondary | ICD-10-CM

## 2023-11-01 DIAGNOSIS — G4733 Obstructive sleep apnea (adult) (pediatric): Secondary | ICD-10-CM | POA: Diagnosis not present

## 2023-11-01 DIAGNOSIS — S32402A Unspecified fracture of left acetabulum, initial encounter for closed fracture: Secondary | ICD-10-CM | POA: Diagnosis not present

## 2023-11-01 DIAGNOSIS — I10 Essential (primary) hypertension: Secondary | ICD-10-CM | POA: Diagnosis not present

## 2023-11-01 HISTORY — PX: OPEN REDUCTION INTERNAL FIXATION ACETABULUM FRACTURE POSTERIOR: SHX6833

## 2023-11-01 LAB — CBC
HCT: 28.2 % — ABNORMAL LOW (ref 36.0–46.0)
Hemoglobin: 8.3 g/dL — ABNORMAL LOW (ref 12.0–15.0)
MCH: 24.3 pg — ABNORMAL LOW (ref 26.0–34.0)
MCHC: 29.4 g/dL — ABNORMAL LOW (ref 30.0–36.0)
MCV: 82.5 fL (ref 80.0–100.0)
Platelets: 253 10*3/uL (ref 150–400)
RBC: 3.42 MIL/uL — ABNORMAL LOW (ref 3.87–5.11)
RDW: 18.7 % — ABNORMAL HIGH (ref 11.5–15.5)
WBC: 11.3 10*3/uL — ABNORMAL HIGH (ref 4.0–10.5)
nRBC: 0.2 % (ref 0.0–0.2)

## 2023-11-01 LAB — BASIC METABOLIC PANEL WITH GFR
Anion gap: 9 (ref 5–15)
BUN: 6 mg/dL (ref 6–20)
CO2: 22 mmol/L (ref 22–32)
Calcium: 8.5 mg/dL — ABNORMAL LOW (ref 8.9–10.3)
Chloride: 104 mmol/L (ref 98–111)
Creatinine, Ser: 0.51 mg/dL (ref 0.44–1.00)
GFR, Estimated: >60 mL/min (ref >60)
Glucose, Bld: 108 mg/dL — ABNORMAL HIGH (ref 70–99)
Potassium: 3.7 mmol/L (ref 3.5–5.1)
Sodium: 135 mmol/L (ref 135–145)

## 2023-11-01 LAB — TYPE AND SCREEN
ABO/RH(D): A POS
Antibody Screen: NEGATIVE

## 2023-11-01 SURGERY — OPEN REDUCTION INTERNAL FIXATION ACETABULUM FRACTURE POSTERIOR
Anesthesia: General | Laterality: Left

## 2023-11-01 MED ORDER — ONDANSETRON HCL 4 MG/2ML IJ SOLN
INTRAMUSCULAR | Status: AC
  Filled 2023-11-01: qty 2

## 2023-11-01 MED ORDER — HYDRALAZINE HCL 20 MG/ML IJ SOLN
INTRAMUSCULAR | Status: AC
  Filled 2023-11-01: qty 1

## 2023-11-01 MED ORDER — PHENYLEPHRINE HCL-NACL 20-0.9 MG/250ML-% IV SOLN
INTRAVENOUS | Status: DC | PRN
  Administered 2023-11-01: 80 ug/min via INTRAVENOUS

## 2023-11-01 MED ORDER — MIDAZOLAM HCL 2 MG/2ML IJ SOLN
INTRAMUSCULAR | Status: AC
  Filled 2023-11-01: qty 2

## 2023-11-01 MED ORDER — METHOCARBAMOL 1000 MG/10ML IJ SOLN
500.0000 mg | Freq: Three times a day (TID) | INTRAMUSCULAR | Status: DC
Start: 2023-11-01 — End: 2023-11-01

## 2023-11-01 MED ORDER — ROCURONIUM BROMIDE 10 MG/ML (PF) SYRINGE
PREFILLED_SYRINGE | INTRAVENOUS | Status: DC | PRN
  Administered 2023-11-01: 80 mg via INTRAVENOUS
  Administered 2023-11-01 (×3): 20 mg via INTRAVENOUS

## 2023-11-01 MED ORDER — CHLORHEXIDINE GLUCONATE 0.12 % MT SOLN
15.0000 mL | Freq: Once | OROMUCOSAL | Status: AC
  Administered 2023-11-01: 15 mL via OROMUCOSAL

## 2023-11-01 MED ORDER — FENTANYL CITRATE (PF) 250 MCG/5ML IJ SOLN
INTRAMUSCULAR | Status: DC | PRN
Start: 2023-11-01 — End: 2023-11-01
  Administered 2023-11-01: 150 ug via INTRAVENOUS
  Administered 2023-11-01: 100 ug via INTRAVENOUS

## 2023-11-01 MED ORDER — ROCURONIUM BROMIDE 10 MG/ML (PF) SYRINGE
PREFILLED_SYRINGE | INTRAVENOUS | Status: AC
  Filled 2023-11-01: qty 20

## 2023-11-01 MED ORDER — VANCOMYCIN HCL 1 G IV SOLR
INTRAVENOUS | Status: DC | PRN
  Administered 2023-11-01: 1000 mg via TOPICAL

## 2023-11-01 MED ORDER — HYDROMORPHONE HCL 1 MG/ML IJ SOLN
0.5000 mg | INTRAMUSCULAR | Status: DC | PRN
  Administered 2023-11-01 – 2023-11-08 (×20): 0.5 mg via INTRAVENOUS
  Filled 2023-11-01 (×20): qty 0.5

## 2023-11-01 MED ORDER — ACETAMINOPHEN 10 MG/ML IV SOLN
INTRAVENOUS | Status: AC
  Filled 2023-11-01: qty 100

## 2023-11-01 MED ORDER — LIDOCAINE 2% (20 MG/ML) 5 ML SYRINGE
INTRAMUSCULAR | Status: DC | PRN
  Administered 2023-11-01: 100 mg via INTRAVENOUS

## 2023-11-01 MED ORDER — 0.9 % SODIUM CHLORIDE (POUR BTL) OPTIME
TOPICAL | Status: DC | PRN
  Administered 2023-11-01: 1000 mL

## 2023-11-01 MED ORDER — ONDANSETRON HCL 4 MG/2ML IJ SOLN
INTRAMUSCULAR | Status: DC | PRN
  Administered 2023-11-01: 4 mg via INTRAVENOUS

## 2023-11-01 MED ORDER — PHENYLEPHRINE 80 MCG/ML (10ML) SYRINGE FOR IV PUSH (FOR BLOOD PRESSURE SUPPORT)
PREFILLED_SYRINGE | INTRAVENOUS | Status: AC
  Filled 2023-11-01: qty 10

## 2023-11-01 MED ORDER — POLYETHYLENE GLYCOL 3350 17 G PO PACK
17.0000 g | PACK | Freq: Two times a day (BID) | ORAL | Status: DC
  Administered 2023-11-01 – 2023-11-14 (×21): 17 g via ORAL
  Filled 2023-11-01 (×27): qty 1

## 2023-11-01 MED ORDER — ALBUMIN HUMAN 5 % IV SOLN: INTRAVENOUS | Status: DC | PRN

## 2023-11-01 MED ORDER — FENTANYL CITRATE (PF) 250 MCG/5ML IJ SOLN
INTRAMUSCULAR | Status: AC
Start: 2023-11-01 — End: 2023-11-01
  Filled 2023-11-01: qty 5

## 2023-11-01 MED ORDER — ACETAMINOPHEN 10 MG/ML IV SOLN
1000.0000 mg | Freq: Once | INTRAVENOUS | Status: DC | PRN
  Administered 2023-11-01: 1000 mg via INTRAVENOUS

## 2023-11-01 MED ORDER — SODIUM CHLORIDE 0.9 % IR SOLN
Status: DC | PRN
  Administered 2023-11-01: 3000 mL

## 2023-11-01 MED ORDER — DEXAMETHASONE SODIUM PHOSPHATE 10 MG/ML IJ SOLN
INTRAMUSCULAR | Status: AC
  Filled 2023-11-01: qty 1

## 2023-11-01 MED ORDER — PROPOFOL 10 MG/ML IV BOLUS
INTRAVENOUS | Status: DC | PRN
  Administered 2023-11-01: 200 ug/kg/min via INTRAVENOUS
  Administered 2023-11-01: 50 ug/kg/min via INTRAVENOUS

## 2023-11-01 MED ORDER — OXYCODONE HCL 5 MG PO TABS: 5.0000 mg | ORAL_TABLET | Freq: Once | ORAL | Status: AC

## 2023-11-01 MED ORDER — OXYCODONE HCL 5 MG PO TABS
ORAL_TABLET | ORAL | Status: AC
  Administered 2023-11-01: 5 mg via ORAL
  Filled 2023-11-01: qty 1

## 2023-11-01 MED ORDER — DEXAMETHASONE SODIUM PHOSPHATE 10 MG/ML IJ SOLN
INTRAMUSCULAR | Status: DC | PRN
  Administered 2023-11-01: 10 mg via INTRAVENOUS

## 2023-11-01 MED ORDER — LACTATED RINGERS IV SOLN: INTRAVENOUS | Status: DC

## 2023-11-01 MED ORDER — OXYCODONE HCL 5 MG PO TABS
10.0000 mg | ORAL_TABLET | ORAL | Status: DC | PRN
  Administered 2023-11-01 (×2): 10 mg via ORAL
  Administered 2023-11-02 – 2023-11-04 (×11): 15 mg via ORAL
  Administered 2023-11-04: 10 mg via ORAL
  Administered 2023-11-04 (×3): 15 mg via ORAL
  Administered 2023-11-05: 10 mg via ORAL
  Filled 2023-11-01: qty 2
  Filled 2023-11-01 (×5): qty 3
  Filled 2023-11-01: qty 2
  Filled 2023-11-01 (×3): qty 3
  Filled 2023-11-01: qty 2
  Filled 2023-11-01 (×2): qty 3
  Filled 2023-11-01: qty 2
  Filled 2023-11-01 (×4): qty 3

## 2023-11-01 MED ORDER — TOBRAMYCIN SULFATE 1.2 G IJ SOLR
INTRAMUSCULAR | Status: AC
  Filled 2023-11-01: qty 1.2

## 2023-11-01 MED ORDER — CEFAZOLIN SODIUM-DEXTROSE 2-4 GM/100ML-% IV SOLN
2.0000 g | Freq: Three times a day (TID) | INTRAVENOUS | Status: AC
  Administered 2023-11-01 – 2023-11-02 (×3): 2 g via INTRAVENOUS
  Filled 2023-11-01 (×3): qty 100

## 2023-11-01 MED ORDER — PHENYLEPHRINE 80 MCG/ML (10ML) SYRINGE FOR IV PUSH (FOR BLOOD PRESSURE SUPPORT)
PREFILLED_SYRINGE | INTRAVENOUS | Status: DC | PRN
  Administered 2023-11-01: 240 ug via INTRAVENOUS
  Administered 2023-11-01: 120 ug via INTRAVENOUS
  Administered 2023-11-01: 240 ug via INTRAVENOUS

## 2023-11-01 MED ORDER — FENTANYL CITRATE (PF) 100 MCG/2ML IJ SOLN
25.0000 ug | INTRAMUSCULAR | Status: DC | PRN
  Administered 2023-11-01 (×3): 50 ug via INTRAVENOUS

## 2023-11-01 MED ORDER — ORAL CARE MOUTH RINSE: 15.0000 mL | Freq: Once | OROMUCOSAL | Status: AC

## 2023-11-01 MED ORDER — FENTANYL CITRATE (PF) 100 MCG/2ML IJ SOLN
INTRAMUSCULAR | Status: AC
Start: 2023-11-01 — End: 2023-11-01
  Filled 2023-11-01: qty 2

## 2023-11-01 MED ORDER — MIDAZOLAM HCL 2 MG/2ML IJ SOLN
INTRAMUSCULAR | Status: DC | PRN
  Administered 2023-11-01: 2 mg via INTRAVENOUS

## 2023-11-01 MED ORDER — VANCOMYCIN HCL 1000 MG IV SOLR
INTRAVENOUS | Status: AC
  Filled 2023-11-01: qty 20

## 2023-11-01 MED ORDER — DEXMEDETOMIDINE HCL IN NACL 80 MCG/20ML IV SOLN
INTRAVENOUS | Status: DC | PRN
  Administered 2023-11-01: 12 ug via INTRAVENOUS

## 2023-11-01 MED ORDER — LIDOCAINE 2% (20 MG/ML) 5 ML SYRINGE
INTRAMUSCULAR | Status: AC
  Filled 2023-11-01: qty 5

## 2023-11-01 MED ORDER — SUGAMMADEX SODIUM 200 MG/2ML IV SOLN
INTRAVENOUS | Status: DC | PRN
  Administered 2023-11-01: 501.2 mg via INTRAVENOUS

## 2023-11-01 MED ORDER — METHOCARBAMOL 500 MG PO TABS
1000.0000 mg | ORAL_TABLET | Freq: Three times a day (TID) | ORAL | Status: DC
  Administered 2023-11-01 – 2023-11-02 (×5): 1000 mg via ORAL
  Filled 2023-11-01 (×6): qty 2

## 2023-11-01 MED ORDER — FENTANYL CITRATE (PF) 100 MCG/2ML IJ SOLN
INTRAMUSCULAR | Status: AC
  Filled 2023-11-01: qty 2

## 2023-11-01 SURGICAL SUPPLY — 70 items
BAG COUNTER SPONGE SURGICOUNT (BAG) IMPLANT
BIT DRILL 2.5X300 (BIT) IMPLANT
BIT DRILL CANN 4.5MM (BIT) IMPLANT
BIT DRILL FLUTED 2.5 (BIT) IMPLANT
BLADE SURG 11 STRL SS (BLADE) ×1 IMPLANT
BRUSH SCRUB EZ PLAIN DRY (MISCELLANEOUS) ×2 IMPLANT
CHLORAPREP W/TINT 26 (MISCELLANEOUS) ×2 IMPLANT
COVER SURGICAL LIGHT HANDLE (MISCELLANEOUS) ×1 IMPLANT
DERMABOND ADVANCED .7 DNX12 (GAUZE/BANDAGES/DRESSINGS) IMPLANT
DRAPE C-ARM 42X72 X-RAY (DRAPES) ×1 IMPLANT
DRAPE C-ARMOR (DRAPES) ×1 IMPLANT
DRAPE INCISE IOBAN 66X45 STRL (DRAPES) IMPLANT
DRAPE INCISE IOBAN 85X60 (DRAPES) ×1 IMPLANT
DRAPE SURG 17X23 STRL (DRAPES) ×6 IMPLANT
DRAPE SURG ORHT 6 SPLT 77X108 (DRAPES) ×2 IMPLANT
DRAPE U-SHAPE 47X51 STRL (DRAPES) ×1 IMPLANT
DRESSING MEPILEX FLEX 4X4 (GAUZE/BANDAGES/DRESSINGS) IMPLANT
DRSG MEPILEX POST OP 4X12 (GAUZE/BANDAGES/DRESSINGS) IMPLANT
DRSG MEPILEX POST OP 4X8 (GAUZE/BANDAGES/DRESSINGS) IMPLANT
ELECT BLADE 6.5 EXT (BLADE) IMPLANT
ELECTRODE BLDE 4.0 EZ CLN MEGD (MISCELLANEOUS) ×1 IMPLANT
ELECTRODE REM PT RTRN 9FT ADLT (ELECTROSURGICAL) ×1 IMPLANT
GLOVE BIO SURGEON STRL SZ 6.5 (GLOVE) ×3 IMPLANT
GLOVE BIO SURGEON STRL SZ7.5 (GLOVE) ×4 IMPLANT
GLOVE BIOGEL PI IND STRL 6.5 (GLOVE) ×1 IMPLANT
GLOVE BIOGEL PI IND STRL 7.5 (GLOVE) ×1 IMPLANT
GOWN STRL REUS W/ TWL LRG LVL3 (GOWN DISPOSABLE) ×2 IMPLANT
GUIDEWIRE 2.8 THREAD 450 L ST (WIRE) IMPLANT
GUIDEWIRE THR 2.0/319 (WIRE) IMPLANT
KIT BASIN OR (CUSTOM PROCEDURE TRAY) ×1 IMPLANT
KIT TURNOVER KIT B (KITS) ×1 IMPLANT
KWIRE 1.6X150 (WIRE) IMPLANT
MANIFOLD NEPTUNE II (INSTRUMENTS) ×1 IMPLANT
NS IRRIG 1000ML POUR BTL (IV SOLUTION) ×1 IMPLANT
PACK TOTAL JOINT (CUSTOM PROCEDURE TRAY) ×1 IMPLANT
PACK UNIVERSAL I (CUSTOM PROCEDURE TRAY) ×1 IMPLANT
PAD ARMBOARD POSITIONER FOAM (MISCELLANEOUS) ×2 IMPLANT
PLATE BONE LOCK 65MM 5 HOLE (Plate) IMPLANT
PLATE PELVIC 8H 3.5 (Plate) IMPLANT
RETRIEVER SUT HEWSON (MISCELLANEOUS) IMPLANT
SCREW CANN 6.5X90 32MM THD (Screw) IMPLANT
SCREW CORTEX 3.5X40MM (Screw) IMPLANT
SCREW CORTEX 3.5X45MM (Screw) IMPLANT
SCREW LOCK CORT ST 3.5X18 (Screw) IMPLANT
SCREW LOCK CORT ST 3.5X20 (Screw) IMPLANT
SCREW LOCK CORT ST 3.5X24 (Screw) IMPLANT
SCREW LOCK CORT ST 3.5X26 (Screw) IMPLANT
SCREW LOCK CORT ST 3.5X28 (Screw) IMPLANT
SCREW LOCK CORT ST 3.5X32 (Screw) IMPLANT
SET HNDPC FAN SPRY TIP SCT (DISPOSABLE) ×1 IMPLANT
SOLUTION ANTFG W/FOAM PAD STRL (MISCELLANEOUS) ×1 IMPLANT
SPONGE T-LAP 18X18 ~~LOC~~+RFID (SPONGE) IMPLANT
STAPLER SKIN PROX 35W (STAPLE) IMPLANT
SUCTION TUBE FRAZIER 10FR DISP (SUCTIONS) ×1 IMPLANT
SUT ETHILON 2 0 PSLX (SUTURE) ×2 IMPLANT
SUT MNCRL AB 3-0 PS2 18 (SUTURE) ×1 IMPLANT
SUT MNCRL AB 3-0 PS2 27 (SUTURE) IMPLANT
SUT MON AB 2-0 CT1 36 (SUTURE) ×1 IMPLANT
SUT MON AB 3-0 SH27 (SUTURE) IMPLANT
SUT VIC AB 0 CT1 27XBRD ANBCTR (SUTURE) ×1 IMPLANT
SUT VIC AB 1 CT1 18XCR BRD 8 (SUTURE) ×1 IMPLANT
SUT VIC AB 1 CT1 27XBRD ANBCTR (SUTURE) ×1 IMPLANT
SUT VIC AB 2-0 CT1 TAPERPNT 27 (SUTURE) ×1 IMPLANT
SUTURE FIBERWR #2 38 T-5 BLUE (SUTURE) IMPLANT
TOWEL GREEN STERILE (TOWEL DISPOSABLE) ×2 IMPLANT
TOWEL GREEN STERILE FF (TOWEL DISPOSABLE) ×2 IMPLANT
TRAY FOLEY MTR SLVR 16FR STAT (SET/KITS/TRAYS/PACK) IMPLANT
UNDERPAD 30X36 HEAVY ABSORB (UNDERPADS AND DIAPERS) ×1 IMPLANT
WATER STERILE IRR 1000ML POUR (IV SOLUTION) IMPLANT
YANKAUER SUCT BULB TIP NO VENT (SUCTIONS) IMPLANT

## 2023-11-01 NOTE — Transfer of Care (Signed)
 Immediate Anesthesia Transfer of Care Note  Patient: Taylor [name]  Procedure(s) Performed: OPEN REDUCTION INTERNAL FIXATION ACETABULUM FRACTURE POSTERIOR (Left)  Patient Location: PACU  Anesthesia Type:General  Level of Consciousness: drowsy  Airway & Oxygen Therapy: Patient Spontanous Breathing and Patient connected to face mask oxygen  Post-op Assessment: Report given to RN and Post -op Vital signs reviewed and stable  Post vital signs: Reviewed and stable  Last Vitals:  Vitals Value Taken Time  BP 93/72 11/01/23 17:02  Temp    Pulse 102 11/01/23 17:04  Resp 18 11/01/23 17:04  SpO2 95 % 11/01/23 17:04  Vitals shown include unfiled device data.  Last Pain:  Vitals:   11/01/23 1206  TempSrc:   PainSc: 6          Complications: No notable events documented.

## 2023-11-01 NOTE — Progress Notes (Signed)
 Inpatient Rehab Admissions Coordinator:   Consult received and chart reviewed.  Note plans for OR today.  Will ask in MDTR about PT/OT orders.  [name], PT, DPT Admissions Coordinator [phone] 11/01/23  9:28 AM

## 2023-11-01 NOTE — Progress Notes (Signed)
 Patient received Oxycodone  5mg  per Dr. Keneth verbal order, for pain (6/10) on left hip and bilateral ribs.
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
## 2023-11-01 NOTE — Anesthesia Procedure Notes (Signed)
 Procedure Name: Intubation Date/Time: 11/01/2023 1:35 PM  Performed by: Alen Motto D, CRNAPre-anesthesia Checklist: Patient identified, Emergency Drugs available, Suction available and Patient being monitored Patient Re-evaluated:Patient Re-evaluated prior to induction Oxygen Delivery Method: Circle System Utilized Preoxygenation: Pre-oxygenation with 100% oxygen Induction Type: IV induction Ventilation: Mask ventilation without difficulty Laryngoscope Size: Mac and 3 Grade View: Grade I Tube type: Oral Tube size: 7.0 mm Number of attempts: 1 Airway Equipment and Method: Stylet and Oral airway Placement Confirmation: ETT inserted through vocal cords under direct vision, positive ETCO2 and breath sounds checked- equal and bilateral Secured at: 21 cm Tube secured with: Tape Dental Injury: Teeth and Oropharynx as per pre-operative assessment

## 2023-11-01 NOTE — Plan of Care (Signed)

## 2023-11-01 NOTE — Anesthesia Preprocedure Evaluation (Addendum)
 Anesthesia Evaluation  Patient identified by MRN, date of birth, ID band Patient awake    Reviewed: Allergy & Precautions, NPO status , Patient's Chart, lab work & pertinent test results, reviewed documented beta blocker date and time   History of Anesthesia Complications (+) Family history of anesthesia reaction  Airway Mallampati: II  TM Distance: >3 FB     Dental no notable dental hx.    Pulmonary asthma , sleep apnea , Current Smoker   breath sounds clear to auscultation       Cardiovascular hypertension, + dysrhythmias  Rhythm:Regular Rate:Normal  1. Left ventricular ejection fraction, by estimation, is 65 to 70%. The  left ventricle has normal function. The left ventricle has no regional  wall motion abnormalities. There is mild left ventricular hypertrophy.  Left ventricular diastolic parameters  were normal.   2. Right ventricular systolic function is normal. The right ventricular  size is normal.   3. The mitral valve is normal in structure. No evidence of mitral valve  regurgitation. No evidence of mitral stenosis.   4. The aortic valve was not well visualized. Aortic valve regurgitation  is not visualized. No aortic stenosis is present.   5. The inferior vena cava is normal in size with greater than 50%  respiratory variability, suggesting right atrial pressure of 3 mmHg.      Neuro/Psych  Headaches PSYCHIATRIC DISORDERS Anxiety Depression Bipolar Disorder    Neuromuscular disease    GI/Hepatic ,GERD  ,,(+)     substance abuse  alcohol use  Endo/Other    Renal/GU      Musculoskeletal  (+) Arthritis ,  Fibromyalgia -  Abdominal   Peds  Hematology  (+) Blood dyscrasia, anemia   Anesthesia Other Findings   Reproductive/Obstetrics                             Anesthesia Physical Anesthesia Plan  ASA: 3  Anesthesia Plan: General   Post-op Pain Management:    Induction:  Intravenous  PONV Risk Score and Plan: 2 and Ondansetron  and Dexamethasone   Airway Management Planned: Oral ETT  Additional Equipment:   Intra-op Plan:   Post-operative Plan: Extubation in OR  Informed Consent: I have reviewed the patients History and Physical, chart, labs and discussed the procedure including the risks, benefits and alternatives for the proposed anesthesia with the patient or authorized representative who has indicated his/her understanding and acceptance.     Dental advisory given  Plan Discussed with: CRNA  Anesthesia Plan Comments:        Anesthesia Quick Evaluation
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
## 2023-11-01 NOTE — Interval H&P Note (Signed)
 History and Physical Interval Note:  11/01/2023 12:41 PM  Taylor [name]  has presented today for surgery, with the diagnosis of Left acetabular fracture.  The various methods of treatment have been discussed with the patient and family. After consideration of risks, benefits and other options for treatment, the patient has consented to  Procedure(s): OPEN REDUCTION INTERNAL FIXATION ACETABULUM FRACTURE POSTERIOR (Left) as a surgical intervention.  The patient's history has been reviewed, patient examined, no change in status, stable for surgery.  I have reviewed the patient's chart and labs.  Questions were answered to the patient's satisfaction.     Taylor [name]

## 2023-11-02 ENCOUNTER — Inpatient Hospital Stay (HOSPITAL_COMMUNITY)

## 2023-11-02 ENCOUNTER — Encounter (HOSPITAL_COMMUNITY): Payer: Self-pay | Admitting: Student

## 2023-11-02 LAB — BASIC METABOLIC PANEL WITH GFR
Anion gap: 8 (ref 5–15)
BUN: 9 mg/dL (ref 6–20)
CO2: 24 mmol/L (ref 22–32)
Calcium: 8.8 mg/dL — ABNORMAL LOW (ref 8.9–10.3)
Chloride: 103 mmol/L (ref 98–111)
Creatinine, Ser: 0.56 mg/dL (ref 0.44–1.00)
GFR, Estimated: >60 mL/min (ref >60)
Glucose, Bld: 116 mg/dL — ABNORMAL HIGH (ref 70–99)
Potassium: 4 mmol/L (ref 3.5–5.1)
Sodium: 135 mmol/L (ref 135–145)

## 2023-11-02 LAB — CBC
HCT: 25 % — ABNORMAL LOW (ref 36.0–46.0)
Hemoglobin: 7.7 g/dL — ABNORMAL LOW (ref 12.0–15.0)
MCH: 24.6 pg — ABNORMAL LOW (ref 26.0–34.0)
MCHC: 30.8 g/dL (ref 30.0–36.0)
MCV: 79.9 fL — ABNORMAL LOW (ref 80.0–100.0)
Platelets: 321 10*3/uL (ref 150–400)
RBC: 3.13 MIL/uL — ABNORMAL LOW (ref 3.87–5.11)
RDW: 18.9 % — ABNORMAL HIGH (ref 11.5–15.5)
WBC: 15.3 10*3/uL — ABNORMAL HIGH (ref 4.0–10.5)
nRBC: 0 % (ref 0.0–0.2)

## 2023-11-02 MED ORDER — PREGABALIN 75 MG PO CAPS
150.0000 mg | ORAL_CAPSULE | Freq: Three times a day (TID) | ORAL | Status: DC
  Administered 2023-11-02 – 2023-11-03 (×4): 150 mg via ORAL
  Filled 2023-11-02 (×4): qty 2

## 2023-11-02 MED ORDER — DULOXETINE HCL 60 MG PO CPEP
120.0000 mg | ORAL_CAPSULE | Freq: Every day | ORAL | Status: DC
  Administered 2023-11-02 – 2023-11-15 (×14): 120 mg via ORAL
  Filled 2023-11-02 (×14): qty 2

## 2023-11-02 MED ORDER — BUPROPION HCL ER (XL) 150 MG PO TB24
300.0000 mg | ORAL_TABLET | Freq: Every day | ORAL | Status: DC
  Administered 2023-11-02 – 2023-11-15 (×14): 300 mg via ORAL
  Filled 2023-11-02 (×13): qty 2
  Filled 2023-11-02: qty 1

## 2023-11-02 MED ORDER — BISACODYL 10 MG RE SUPP: 10.0000 mg | Freq: Once | RECTAL | Status: DC

## 2023-11-02 MED ORDER — CARIPRAZINE HCL 1.5 MG PO CAPS
1.5000 mg | ORAL_CAPSULE | Freq: Every day | ORAL | Status: DC
  Administered 2023-11-02 – 2023-11-15 (×14): 1.5 mg via ORAL
  Filled 2023-11-02 (×15): qty 1

## 2023-11-02 MED ORDER — PANTOPRAZOLE SODIUM 40 MG PO TBEC
80.0000 mg | DELAYED_RELEASE_TABLET | Freq: Every day | ORAL | Status: DC
  Administered 2023-11-02 – 2023-11-15 (×14): 80 mg via ORAL
  Filled 2023-11-02 (×14): qty 2

## 2023-11-02 MED ORDER — TRAZODONE HCL 100 MG PO TABS
100.0000 mg | ORAL_TABLET | Freq: Every day | ORAL | Status: DC
  Administered 2023-11-02 – 2023-11-15 (×14): 100 mg via ORAL
  Filled 2023-11-02 (×14): qty 1

## 2023-11-02 MED ORDER — IBUPROFEN 800 MG PO TABS
800.0000 mg | ORAL_TABLET | Freq: Three times a day (TID) | ORAL | Status: DC
  Administered 2023-11-02 – 2023-11-15 (×42): 800 mg via ORAL
  Filled 2023-11-02 (×17): qty 1
  Filled 2023-11-02: qty 2
  Filled 2023-11-02 (×24): qty 1

## 2023-11-02 NOTE — Progress Notes (Signed)
 Patient ID: Taylor [name], female   DOB: February 08, 1991, 33 y.o.   MRN: [id number]   LOS: 4 days   Subjective: Having hip pain but doing well regardless.   Objective: Vital signs in last 24 hours: Temp:  [97.9 F (36.6 C)-99.9 F (37.7 C)] 98.1 F (36.7 C) (06/24 0749) Pulse Rate:  [80-113] 93 (06/24 0749) Resp:  [13-92] 14 (06/24 0749) BP: (64-142)/(50-87) 123/76 (06/24 0749) SpO2:  [93 %-98 %] 96 % (06/24 0749) Weight:  [125.3 kg] 125.3 kg (06/23 1118) Last BM Date : 10/28/23   Laboratory  CBC Recent Labs    11/01/23 0424 11/02/23 0251  WBC 11.3* 15.3*  HGB 8.3* 7.7*  HCT 28.2* 25.0*  PLT 253 321   BMET Recent Labs    11/01/23 0424 11/02/23 0251  NA 135 135  K 3.7 4.0  CL 104 103  CO2 22 24  GLUCOSE 108* 116*  BUN 6 9  CREATININE 0.51 0.56  CALCIUM 8.5* 8.8*     Physical Exam General appearance: alert and no distress LLE Dressing intact and unsoiled, no ecchymosis or rash  No knee or ankle effusion  Knee stable to varus/ valgus and anterior/posterior stress  Sens DPN, SPN, TN intact  Motor EHL 4/5 ext, flex, evers 0/5  DP 0, PT 2+, No significant edema   Assessment/Plan: Left acetabulum fx s/p ORIF -- Continue NWB LLE. PT/OT and PRAFO for foot drop.    Taylor DOROTHA Ned, PA-C Orthopedic Surgery [phone] 11/02/2023
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
## 2023-11-02 NOTE — Progress Notes (Signed)
 Orthopedic Tech Progress Note Patient Details:  Taylor [name] 1990-10-21 [id number]  Spoke with PA about PRAFO BOOT, I let him know she has one that was delivered on the 21st. He stated it should be on at all times, so I reached out to RN letting her know what he said   Patient ID: Taylor [name], female   DOB: 20-Jan-1991, 33 y.o.   MRN: [id number]  Taylor [name] 11/02/2023, 9:39 AM
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
## 2023-11-02 NOTE — Anesthesia Postprocedure Evaluation (Signed)
 Anesthesia Post Note  Patient: Taylor [name]  Procedure(s) Performed: OPEN REDUCTION INTERNAL FIXATION ACETABULUM FRACTURE POSTERIOR (Left)     Patient location during evaluation: PACU Anesthesia Type: General Level of consciousness: sedated and patient cooperative Pain management: pain level controlled Vital Signs Assessment: post-procedure vital signs reviewed and stable Respiratory status: spontaneous breathing Cardiovascular status: stable Anesthetic complications: no   No notable events documented.  Last Vitals:  Vitals:   11/02/23 0211 11/02/23 0400  BP: (!) 107/59 112/63  Pulse: 82 91  Resp:  18  Temp:  36.7 C  SpO2:  94%    Last Pain:  Vitals:   11/02/23 0548  TempSrc:   PainSc: 9                  Norleen Pope

## 2023-11-03 LAB — CBC
HCT: 23.8 % — ABNORMAL LOW (ref 36.0–46.0)
Hemoglobin: 7 g/dL — ABNORMAL LOW (ref 12.0–15.0)
MCH: 24.5 pg — ABNORMAL LOW (ref 26.0–34.0)
MCHC: 29.4 g/dL — ABNORMAL LOW (ref 30.0–36.0)
MCV: 83.2 fL (ref 80.0–100.0)
Platelets: 313 10*3/uL (ref 150–400)
RBC: 2.86 MIL/uL — ABNORMAL LOW (ref 3.87–5.11)
RDW: 19.5 % — ABNORMAL HIGH (ref 11.5–15.5)
WBC: 15.6 10*3/uL — ABNORMAL HIGH (ref 4.0–10.5)
nRBC: 0 % (ref 0.0–0.2)

## 2023-11-03 LAB — BASIC METABOLIC PANEL WITH GFR
Anion gap: 11 (ref 5–15)
BUN: 11 mg/dL (ref 6–20)
CO2: 22 mmol/L (ref 22–32)
Calcium: 8.4 mg/dL — ABNORMAL LOW (ref 8.9–10.3)
Chloride: 101 mmol/L (ref 98–111)
Creatinine, Ser: 0.59 mg/dL (ref 0.44–1.00)
GFR, Estimated: >60 mL/min (ref >60)
Glucose, Bld: 124 mg/dL — ABNORMAL HIGH (ref 70–99)
Potassium: 3.6 mmol/L (ref 3.5–5.1)
Sodium: 134 mmol/L — ABNORMAL LOW (ref 135–145)

## 2023-11-03 MED ORDER — FERROUS SULFATE 325 (65 FE) MG PO TABS
325.0000 mg | ORAL_TABLET | Freq: Every day | ORAL | Status: DC
  Administered 2023-11-04 – 2023-11-15 (×12): 325 mg via ORAL
  Filled 2023-11-03 (×12): qty 1

## 2023-11-03 MED ORDER — BISACODYL 10 MG RE SUPP
10.0000 mg | Freq: Once | RECTAL | Status: AC
  Administered 2023-11-03: 10 mg via RECTAL
  Filled 2023-11-03: qty 1

## 2023-11-03 MED ORDER — VITAMIN C 500 MG PO TABS
500.0000 mg | ORAL_TABLET | Freq: Two times a day (BID) | ORAL | Status: DC
  Administered 2023-11-03 – 2023-11-15 (×26): 500 mg via ORAL
  Filled 2023-11-03 (×26): qty 1

## 2023-11-03 MED ORDER — LIDOCAINE 5 % EX PTCH
1.0000 | MEDICATED_PATCH | CUTANEOUS | Status: DC
  Administered 2023-11-03 – 2023-11-09 (×6): 1 via TRANSDERMAL
  Filled 2023-11-03 (×6): qty 1

## 2023-11-03 MED ORDER — SODIUM CHLORIDE 0.9% FLUSH: 10.0000 mL | INTRAVENOUS | Status: DC | PRN

## 2023-11-03 MED ORDER — MAGNESIUM HYDROXIDE 400 MG/5ML PO SUSP
15.0000 mL | Freq: Once | ORAL | Status: DC
  Filled 2023-11-03: qty 30

## 2023-11-03 MED ORDER — PREGABALIN 100 MG PO CAPS
200.0000 mg | ORAL_CAPSULE | Freq: Three times a day (TID) | ORAL | Status: DC
  Administered 2023-11-03 – 2023-11-15 (×37): 200 mg via ORAL
  Filled 2023-11-03 (×38): qty 2

## 2023-11-03 MED ORDER — METHOCARBAMOL 500 MG PO TABS
1000.0000 mg | ORAL_TABLET | Freq: Four times a day (QID) | ORAL | Status: DC
  Administered 2023-11-03 – 2023-11-15 (×51): 1000 mg via ORAL
  Filled 2023-11-03 (×51): qty 2

## 2023-11-03 MED ORDER — TRAMADOL HCL 50 MG PO TABS
50.0000 mg | ORAL_TABLET | Freq: Four times a day (QID) | ORAL | Status: DC
  Administered 2023-11-03 – 2023-11-04 (×3): 50 mg via ORAL
  Filled 2023-11-03 (×4): qty 1

## 2023-11-03 NOTE — Progress Notes (Signed)
 Patient reported 8/10 pain located on left hip and knee. Patient educated on scheduled pain medication, but after patient was still wanting scheduled and PRN stating the scheduled is not enough. Consulting civil engineer and PA Maczis notified of pain management. PRN administered, will reassess.
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
## 2023-11-03 NOTE — Progress Notes (Signed)
 Pt IV was occluded and came out. Sent IV team consult so they could possibly place a midline because she is a hard stick with little areas left to access.
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
## 2023-11-03 NOTE — Progress Notes (Signed)
 PRN pain medication reassessment completed, patient reported a decrease in pain but rates pain a 5/10 located on left hip and knee.

## 2023-11-03 NOTE — Care Management Important Message (Signed)
 Important Message  Patient Details  Name: Taylor [name] MRN: [id number] Date of Birth: 26-Aug-1990   Important Message Given:  Yes - Medicare IM     Jon Cruel 11/03/2023, 10:08 AM

## 2023-11-03 NOTE — Plan of Care (Signed)
  Problem: Health Behavior/Discharge Planning: Goal: Ability to manage health-related needs will improve Outcome: Progressing   Problem: Nutrition: Goal: Adequate nutrition will be maintained Outcome: Progressing   Problem: Activity: Goal: Risk for activity intolerance will decrease Outcome: Not Progressing   Problem: Coping: Goal: Level of anxiety will decrease Outcome: Not Progressing   Problem: Pain Managment: Goal: General experience of comfort will improve and/or be controlled Outcome: Not Progressing
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
## 2023-11-03 NOTE — Progress Notes (Signed)
 Inpatient Rehab Admissions Coordinator:   Discussed with OT.  Pt continues to be pain limited POD 2, but documentation does indicate pain control is slowly improving.  I will plan to meet with her at bedside tomorrow.   [name], PT, DPT Admissions Coordinator [phone] 11/03/23  11:39 AM

## 2023-11-03 NOTE — Plan of Care (Signed)

## 2023-11-04 DIAGNOSIS — R030 Elevated blood-pressure reading, without diagnosis of hypertension: Secondary | ICD-10-CM | POA: Diagnosis not present

## 2023-11-04 DIAGNOSIS — F10929 Alcohol use, unspecified with intoxication, unspecified: Secondary | ICD-10-CM

## 2023-11-04 DIAGNOSIS — T1490XA Injury, unspecified, initial encounter: Secondary | ICD-10-CM | POA: Diagnosis not present

## 2023-11-04 DIAGNOSIS — S2243XD Multiple fractures of ribs, bilateral, subsequent encounter for fracture with routine healing: Secondary | ICD-10-CM | POA: Diagnosis not present

## 2023-11-04 DIAGNOSIS — F32A Depression, unspecified: Secondary | ICD-10-CM

## 2023-11-04 DIAGNOSIS — Z89511 Acquired absence of right leg below knee: Secondary | ICD-10-CM

## 2023-11-04 DIAGNOSIS — K5900 Constipation, unspecified: Secondary | ICD-10-CM

## 2023-11-04 DIAGNOSIS — M797 Fibromyalgia: Secondary | ICD-10-CM

## 2023-11-04 LAB — CBC
HCT: 23 % — ABNORMAL LOW (ref 36.0–46.0)
Hemoglobin: 7 g/dL — ABNORMAL LOW (ref 12.0–15.0)
MCH: 24.9 pg — ABNORMAL LOW (ref 26.0–34.0)
MCHC: 30.4 g/dL (ref 30.0–36.0)
MCV: 81.9 fL (ref 80.0–100.0)
Platelets: 355 10*3/uL (ref 150–400)
RBC: 2.81 MIL/uL — ABNORMAL LOW (ref 3.87–5.11)
RDW: 19.9 % — ABNORMAL HIGH (ref 11.5–15.5)
WBC: 15.3 10*3/uL — ABNORMAL HIGH (ref 4.0–10.5)
nRBC: 0.1 % (ref 0.0–0.2)

## 2023-11-04 MED ORDER — TRAMADOL HCL 50 MG PO TABS
100.0000 mg | ORAL_TABLET | Freq: Four times a day (QID) | ORAL | Status: DC
  Administered 2023-11-04 – 2023-11-15 (×45): 100 mg via ORAL
  Filled 2023-11-04 (×46): qty 2
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
## 2023-11-04 NOTE — Progress Notes (Signed)
 Inpatient Rehab Coordinator Note:  I met with patient at bedside to discuss CIR recommendations and goals/expectations of CIR stay.  We reviewed 3 hrs/day of therapy, physician follow up, and average length of stay 2 weeks (dependent upon progress) with goals of supervision to mod I.  She confirms that her dad is available to help at discharge if needed.  She is familiar from our program from previous admissions.  I will ask PMR MD to consult today for insurance authorization support and send to [facility] for request.   [name], PT, DPT Admissions Coordinator [phone] 11/04/23  1:17 PM
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
## 2023-11-04 NOTE — Progress Notes (Signed)
 Placed Pt boot on left foot at 2100, Pt requested boot to be removed at 2300.

## 2023-11-04 NOTE — Plan of Care (Signed)

## 2023-11-05 LAB — CBC
HCT: 22.3 % — ABNORMAL LOW (ref 36.0–46.0)
Hemoglobin: 6.7 g/dL — CL (ref 12.0–15.0)
MCH: 24.6 pg — ABNORMAL LOW (ref 26.0–34.0)
MCHC: 30 g/dL (ref 30.0–36.0)
MCV: 82 fL (ref 80.0–100.0)
Platelets: 457 10*3/uL — ABNORMAL HIGH (ref 150–400)
RBC: 2.72 MIL/uL — ABNORMAL LOW (ref 3.87–5.11)
RDW: 20 % — ABNORMAL HIGH (ref 11.5–15.5)
WBC: 15.1 10*3/uL — ABNORMAL HIGH (ref 4.0–10.5)
nRBC: 0 % (ref 0.0–0.2)

## 2023-11-05 LAB — PREPARE RBC (CROSSMATCH)

## 2023-11-05 MED ORDER — SODIUM CHLORIDE 0.9% IV SOLUTION: Freq: Once | INTRAVENOUS | Status: DC

## 2023-11-05 MED ORDER — OXYCODONE HCL 5 MG PO TABS
5.0000 mg | ORAL_TABLET | ORAL | Status: DC | PRN
  Administered 2023-11-05: 5 mg via ORAL
  Administered 2023-11-06 – 2023-11-07 (×5): 10 mg via ORAL
  Administered 2023-11-07: 5 mg via ORAL
  Administered 2023-11-08 – 2023-11-11 (×16): 10 mg via ORAL
  Administered 2023-11-11: 5 mg via ORAL
  Administered 2023-11-11 – 2023-11-12 (×5): 10 mg via ORAL
  Administered 2023-11-13: 5 mg via ORAL
  Administered 2023-11-13 – 2023-11-15 (×12): 10 mg via ORAL
  Filled 2023-11-05 (×18): qty 2
  Filled 2023-11-05: qty 1
  Filled 2023-11-05 (×2): qty 2
  Filled 2023-11-05: qty 1
  Filled 2023-11-05 (×20): qty 2

## 2023-11-05 MED ORDER — BISACODYL 10 MG RE SUPP
10.0000 mg | Freq: Once | RECTAL | Status: AC
  Administered 2023-11-05: 10 mg via RECTAL
  Filled 2023-11-05: qty 1

## 2023-11-05 MED ORDER — OXYCODONE HCL ER 10 MG PO T12A
20.0000 mg | EXTENDED_RELEASE_TABLET | Freq: Two times a day (BID) | ORAL | Status: DC
  Administered 2023-11-05 – 2023-11-08 (×8): 20 mg via ORAL
  Filled 2023-11-05 (×9): qty 2

## 2023-11-05 NOTE — Progress Notes (Signed)
 Inpatient Rehab Admissions Coordinator:   Provided p2P information for [facility]. Will continue to follow. I won't have a bed for this patient over the weekend.   [name], PT, DPT Admissions Coordinator [phone] 11/05/23  10:40 AM

## 2023-11-05 NOTE — Plan of Care (Signed)
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
## 2023-11-05 NOTE — Progress Notes (Signed)
 Iv team consult placed for labs to be drawn. Pt has a midline

## 2023-11-05 NOTE — TOC Progression Note (Signed)
 Transition of Care [facility]) - Progression Note    Patient Details  Name: Taylor [name] MRN: [id number] Date of Birth: [birth year]  [facility] CM/SW Contact  Morgana Rowley E Vianne Grieshop, LCSW Phone Number: 11/05/2023, 9:01 AM  Clinical Narrative:    TOC following, plan for CIR pending insurance authorization.   Expected Discharge Plan: IP Rehab Facility Barriers to Discharge: Continued Medical Work up  Expected Discharge Plan and Services                                               Social Determinants of Health (SDOH) Interventions SDOH Screenings   Food Insecurity: Food Insecurity Present (10/30/2023)  Housing: Low Risk  (10/30/2023)  Transportation Needs: Unmet Transportation Needs (10/30/2023)  Utilities: Not At Risk (10/30/2023)  Depression (PHQ2-9): Low Risk  (09/13/2023)  Recent Concern: Depression (PHQ2-9) - High Risk (07/01/2023)  Financial Resource Strain: Medium Risk (11/11/2022)  Physical Activity: Unknown (11/11/2022)  Social Connections: Socially Isolated (11/11/2022)  Stress: Stress Concern Present (11/11/2022)  Tobacco Use: High Risk (11/01/2023)    Readmission Risk Interventions     No data to display
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
## 2023-11-06 LAB — CBC
HCT: 24 % — ABNORMAL LOW (ref 36.0–46.0)
Hemoglobin: 7.2 g/dL — ABNORMAL LOW (ref 12.0–15.0)
MCH: 24.8 pg — ABNORMAL LOW (ref 26.0–34.0)
MCHC: 30 g/dL (ref 30.0–36.0)
MCV: 82.8 fL (ref 80.0–100.0)
Platelets: 452 10*3/uL — ABNORMAL HIGH (ref 150–400)
RBC: 2.9 MIL/uL — ABNORMAL LOW (ref 3.87–5.11)
RDW: 19.4 % — ABNORMAL HIGH (ref 11.5–15.5)
WBC: 13.1 10*3/uL — ABNORMAL HIGH (ref 4.0–10.5)
nRBC: 0 % (ref 0.0–0.2)

## 2023-11-06 LAB — TYPE AND SCREEN
ABO/RH(D): A POS
Antibody Screen: NEGATIVE
Unit division: 0

## 2023-11-06 LAB — BPAM RBC
Blood Product Expiration Date: 202507022359
ISSUE DATE / TIME: 202506272250
Unit Type and Rh: 600
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
## 2023-11-06 NOTE — Plan of Care (Signed)
  Problem: Clinical Measurements: Goal: Will remain free from infection Outcome: Progressing Goal: Diagnostic test results will improve Outcome: Progressing   Problem: Activity: Goal: Risk for activity intolerance will decrease Outcome: Progressing   Problem: Nutrition: Goal: Adequate nutrition will be maintained Outcome: Progressing   Problem: Coping: Goal: Level of anxiety will decrease Outcome: Progressing
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
## 2023-11-06 NOTE — Plan of Care (Signed)
  Problem: Pain Managment: Goal: General experience of comfort will improve and/or be controlled Outcome: Progressing   Problem: Safety: Goal: Ability to remain free from injury will improve Outcome: Progressing

## 2023-11-07 NOTE — Plan of Care (Signed)
  Problem: Pain Managment: Goal: General experience of comfort will improve and/or be controlled Outcome: Progressing   Problem: Safety: Goal: Ability to remain free from injury will improve Outcome: Progressing

## 2023-11-07 NOTE — Plan of Care (Signed)
 Pt expressing breakthrough pain, Dilaudid  0.5 IV given.
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
## 2023-11-08 MED ORDER — HYDROMORPHONE HCL 1 MG/ML IJ SOLN
0.5000 mg | Freq: Three times a day (TID) | INTRAMUSCULAR | Status: DC | PRN
  Administered 2023-11-08 – 2023-11-09 (×2): 0.5 mg via INTRAVENOUS
  Filled 2023-11-08 (×3): qty 0.5

## 2023-11-08 NOTE — Plan of Care (Signed)
  Problem: Pain Managment: Goal: General experience of comfort will improve and/or be controlled Outcome: Progressing   Problem: Safety: Goal: Ability to remain free from injury will improve Outcome: Progressing

## 2023-11-08 NOTE — Progress Notes (Signed)
 Inpatient Rehab Admissions Coordinator:   Peer to peer complete and request for CIR was denied.  I spoke to pt and she would like to appeal decision.  I will send this request today.   [name], PT, DPT Admissions Coordinator [phone] 11/08/23  2:56 PM

## 2023-11-08 NOTE — Plan of Care (Signed)
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
## 2023-11-09 MED ORDER — OXYCODONE HCL ER 15 MG PO T12A
25.0000 mg | EXTENDED_RELEASE_TABLET | Freq: Two times a day (BID) | ORAL | Status: DC
  Administered 2023-11-09 – 2023-11-15 (×14): 25 mg via ORAL
  Filled 2023-11-09 (×14): qty 1

## 2023-11-09 MED ORDER — HYDROMORPHONE HCL 1 MG/ML IJ SOLN
0.2500 mg | Freq: Three times a day (TID) | INTRAMUSCULAR | Status: DC | PRN
  Administered 2023-11-09 – 2023-11-11 (×2): 0.25 mg via INTRAVENOUS
  Filled 2023-11-09 (×2): qty 0.5

## 2023-11-09 NOTE — Progress Notes (Signed)
 Inpatient Rehab Admissions Coordinator:   Awaiting determination from [facility] regarding expedited appeal.    [name], PT, DPT Admissions Coordinator [phone] 11/09/23  10:30 AM

## 2023-11-09 NOTE — Plan of Care (Signed)
   Problem: Education: Goal: Knowledge of General Education information will improve Description Including pain rating scale, medication(s)/side effects and non-pharmacologic comfort measures Outcome: Progressing   Problem: Health Behavior/Discharge Planning: Goal: Ability to manage health-related needs will improve Outcome: Progressing
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
## 2023-11-09 NOTE — TOC Progression Note (Signed)
 Transition of Care [facility]) - Progression Note    Patient Details  Name: Taylor [name] MRN: [id number] Date of Birth: 07-May-1991  [facility] CM/SW Contact  Carmelita FORBES Carbon, LCSW Phone Number: 11/09/2023, 8:49 AM  Clinical Narrative:    CIR insurance appeal is pending. CSW met with patient at bedside. Patient states she is still very hopeful that CIR is approved. Patient states she does not want SNF as a back up plan. Patient states if her appeal is denied, she will go home.   Expected Discharge Plan: IP Rehab Facility Barriers to Discharge: Continued Medical Work up  Expected Discharge Plan and Services                                               Social Determinants of Health (SDOH) Interventions SDOH Screenings   Food Insecurity: Food Insecurity Present (10/30/2023)  Housing: Low Risk  (10/30/2023)  Transportation Needs: Unmet Transportation Needs (10/30/2023)  Utilities: Not At Risk (10/30/2023)  Depression (PHQ2-9): Low Risk  (09/13/2023)  Recent Concern: Depression (PHQ2-9) - High Risk (07/01/2023)  Financial Resource Strain: Medium Risk (11/11/2022)  Physical Activity: Unknown (11/11/2022)  Social Connections: Socially Isolated (11/11/2022)  Stress: Stress Concern Present (11/11/2022)  Tobacco Use: High Risk (11/01/2023)    Readmission Risk Interventions     No data to display

## 2023-11-10 ENCOUNTER — Inpatient Hospital Stay (HOSPITAL_COMMUNITY)

## 2023-11-10 MED ORDER — LIDOCAINE 5 % EX PTCH
3.0000 | MEDICATED_PATCH | Freq: Every day | CUTANEOUS | Status: DC
  Administered 2023-11-10 – 2023-11-15 (×6): 3 via TRANSDERMAL
  Filled 2023-11-10 (×6): qty 3

## 2023-11-10 NOTE — Progress Notes (Signed)
 Inpatient Rehab Admissions Coordinator:   Called to confirm receipt of expedited appeal faxed Monday, but representative didn't have record of it.  I refaxed and will call in 1 hour to confirm receipt and request escalated review.   [name], PT, DPT Admissions Coordinator [phone] 11/10/23  10:24 AM
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
## 2023-11-11 LAB — CBC
HCT: 26.7 % — ABNORMAL LOW (ref 36.0–46.0)
Hemoglobin: 7.8 g/dL — ABNORMAL LOW (ref 12.0–15.0)
MCH: 24.9 pg — ABNORMAL LOW (ref 26.0–34.0)
MCHC: 29.2 g/dL — ABNORMAL LOW (ref 30.0–36.0)
MCV: 85.3 fL (ref 80.0–100.0)
Platelets: 527 10*3/uL — ABNORMAL HIGH (ref 150–400)
RBC: 3.13 MIL/uL — ABNORMAL LOW (ref 3.87–5.11)
RDW: 20.7 % — ABNORMAL HIGH (ref 11.5–15.5)
WBC: 11.5 10*3/uL — ABNORMAL HIGH (ref 4.0–10.5)
nRBC: 0 % (ref 0.0–0.2)

## 2023-11-11 LAB — VITAMIN D 25 HYDROXY (VIT D DEFICIENCY, FRACTURES): Vit D, 25-Hydroxy: 20.02 ng/mL — ABNORMAL LOW (ref 30–100)

## 2023-11-11 MED ORDER — VITAMIN D 25 MCG (1000 UNIT) PO TABS
2000.0000 [IU] | ORAL_TABLET | Freq: Every day | ORAL | Status: DC
  Administered 2023-11-11 – 2023-11-15 (×5): 2000 [IU] via ORAL
  Filled 2023-11-11 (×5): qty 2
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
## 2023-11-11 NOTE — TOC Progression Note (Signed)
 Transition of Care [facility]) - Progression Note    Patient Details  Name: Taylor [name] MRN: [id number] Date of Birth: [birth year]  [facility] CM/SW Contact  Carmelita FORBES Carbon, LCSW Phone Number: 11/11/2023, 11:24 AM  Clinical Narrative:    CIR appeal pending.   Expected Discharge Plan: IP Rehab Facility Barriers to Discharge: Continued Medical Work up  Expected Discharge Plan and Services                                               Social Determinants of Health (SDOH) Interventions SDOH Screenings   Food Insecurity: Food Insecurity Present (10/30/2023)  Housing: Low Risk  (10/30/2023)  Transportation Needs: Unmet Transportation Needs (10/30/2023)  Utilities: Not At Risk (10/30/2023)  Depression (PHQ2-9): Low Risk  (09/13/2023)  Recent Concern: Depression (PHQ2-9) - High Risk (07/01/2023)  Financial Resource Strain: Medium Risk (11/11/2022)  Physical Activity: Unknown (11/11/2022)  Social Connections: Socially Isolated (11/11/2022)  Stress: Stress Concern Present (11/11/2022)  Tobacco Use: High Risk (11/01/2023)    Readmission Risk Interventions     No data to display

## 2023-11-11 NOTE — Plan of Care (Signed)
  Problem: Skin Integrity: Goal: Risk for impaired skin integrity will decrease 11/11/2023 0252 by Lundon Verdejo C, RN Outcome: Progressing 11/11/2023 0252 by Louvella Greig BROCKS, RN Outcome: Progressing   Problem: Safety: Goal: Ability to remain free from injury will improve 11/11/2023 0252 by Saarah Dewing C, RN Outcome: Progressing 11/11/2023 0252 by Louvella Greig BROCKS, RN Outcome: Progressing   Problem: Pain Managment: Goal: General experience of comfort will improve and/or be controlled 11/11/2023 0252 by Louvella Greig BROCKS, RN Outcome: Progressing 11/11/2023 0252 by Louvella Greig BROCKS, RN Outcome: Progressing   Problem: Activity: Goal: Risk for activity intolerance will decrease 11/11/2023 0252 by Louvella Greig BROCKS, RN Outcome: Progressing 11/11/2023 0252 by Louvella Greig BROCKS, RN Outcome: Progressing

## 2023-11-12 NOTE — Plan of Care (Signed)
   Problem: Education: Goal: Knowledge of General Education information will improve Description Including pain rating scale, medication(s)/side effects and non-pharmacologic comfort measures Outcome: Progressing

## 2023-11-12 NOTE — Progress Notes (Signed)
 Inpatient Rehab Admissions Coordinator:   I've not received a contact from [facility] regarding expedited appeal.  I called this morning for update and offices are closed for the holiday.  I did leave a message with expedited appeals customer service rep advising that this case has been pending since Monday, and updates were sent Wednesday AM so they are well outside of the 72 hour timeframe required to render a determination.  Will continue to follow.   [name], PT, DPT Admissions Coordinator [phone] 11/12/23  8:51 AM

## 2023-11-12 NOTE — Plan of Care (Signed)
  Problem: Education: Goal: Knowledge of General Education information will improve Description: Including pain rating scale, medication(s)/side effects and non-pharmacologic comfort measures Outcome: Progressing   Problem: Clinical Measurements: Goal: Will remain free from infection Outcome: Progressing   Problem: Nutrition: Goal: Adequate nutrition will be maintained Outcome: Progressing   Problem: Pain Managment: Goal: General experience of comfort will improve and/or be controlled Outcome: Progressing   Problem: Skin Integrity: Goal: Risk for impaired skin integrity will decrease Outcome: Progressing
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
## 2023-11-13 NOTE — Plan of Care (Signed)

## 2023-11-14 LAB — URINALYSIS, ROUTINE W REFLEX MICROSCOPIC
Bilirubin Urine: NEGATIVE
Glucose, UA: NEGATIVE mg/dL
Hgb urine dipstick: NEGATIVE
Ketones, ur: NEGATIVE mg/dL
Nitrite: NEGATIVE
Protein, ur: NEGATIVE mg/dL
Specific Gravity, Urine: 1.027 (ref 1.005–1.030)
pH: 5 (ref 5.0–8.0)

## 2023-11-14 NOTE — Plan of Care (Signed)

## 2023-11-14 NOTE — Plan of Care (Signed)
  Problem: Clinical Measurements: Goal: Will remain free from infection Outcome: Progressing Goal: Diagnostic test results will improve Outcome: Progressing   Problem: Activity: Goal: Risk for activity intolerance will decrease Outcome: Progressing   Problem: Nutrition: Goal: Adequate nutrition will be maintained Outcome: Progressing   Problem: Coping: Goal: Level of anxiety will decrease Outcome: Progressing   Problem: Safety: Goal: Ability to remain free from injury will improve Outcome: Progressing
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
## 2023-11-15 ENCOUNTER — Other Ambulatory Visit (HOSPITAL_COMMUNITY): Payer: Self-pay

## 2023-11-15 LAB — URINALYSIS, W/ REFLEX TO CULTURE (INFECTION SUSPECTED)
Bilirubin Urine: NEGATIVE
Glucose, UA: NEGATIVE mg/dL
Hgb urine dipstick: NEGATIVE
Ketones, ur: NEGATIVE mg/dL
Leukocytes,Ua: NEGATIVE
Nitrite: NEGATIVE
Protein, ur: NEGATIVE mg/dL
Specific Gravity, Urine: 1.014 (ref 1.005–1.030)
pH: 5 (ref 5.0–8.0)

## 2023-11-15 MED ORDER — ADULT MULTIVITAMIN W/MINERALS CH: 1.0000 | ORAL_TABLET | Freq: Every day | ORAL | Status: AC

## 2023-11-15 MED ORDER — TRAMADOL HCL 50 MG PO TABS
100.0000 mg | ORAL_TABLET | Freq: Four times a day (QID) | ORAL | 0 refills | Status: DC | PRN
  Filled 2023-11-15: qty 20, 3d supply, fill #0

## 2023-11-15 MED ORDER — ASCORBIC ACID 500 MG PO TABS: 500.0000 mg | ORAL_TABLET | Freq: Every day | ORAL | 0 refills | Status: DC

## 2023-11-15 MED ORDER — APIXABAN 2.5 MG PO TABS
2.5000 mg | ORAL_TABLET | Freq: Two times a day (BID) | ORAL | 0 refills | Status: DC
  Filled 2023-11-15: qty 60, 30d supply, fill #0

## 2023-11-15 MED ORDER — FERROUS SULFATE 325 (65 FE) MG PO TABS
325.0000 mg | ORAL_TABLET | Freq: Every day | ORAL | 0 refills | Status: DC
  Filled 2023-11-15: qty 30, 30d supply, fill #0

## 2023-11-15 MED ORDER — DOCUSATE SODIUM 100 MG PO CAPS: 100.0000 mg | ORAL_CAPSULE | Freq: Two times a day (BID) | ORAL | Status: DC | PRN

## 2023-11-15 MED ORDER — METHOCARBAMOL 500 MG PO TABS
1000.0000 mg | ORAL_TABLET | Freq: Four times a day (QID) | ORAL | 0 refills | Status: AC | PRN
  Filled 2023-11-15: qty 90, 12d supply, fill #0

## 2023-11-15 MED ORDER — POLYETHYLENE GLYCOL 3350 17 G PO PACK: 17.0000 g | PACK | Freq: Two times a day (BID) | ORAL | Status: DC | PRN

## 2023-11-15 MED ORDER — ACETAMINOPHEN 500 MG PO TABS
1000.0000 mg | ORAL_TABLET | Freq: Three times a day (TID) | ORAL | Status: DC | PRN
Start: 2023-11-15 — End: 2023-12-20

## 2023-11-15 MED ORDER — VITAMIN B-1 100 MG PO TABS: 100.0000 mg | ORAL_TABLET | Freq: Every day | ORAL | Status: DC

## 2023-11-15 MED ORDER — OXYCODONE HCL 5 MG PO TABS
5.0000 mg | ORAL_TABLET | Freq: Four times a day (QID) | ORAL | 0 refills | Status: DC | PRN
  Filled 2023-11-15: qty 20, 5d supply, fill #0

## 2023-11-15 MED ORDER — OXYCODONE HCL ER 20 MG PO T12A
20.0000 mg | EXTENDED_RELEASE_TABLET | Freq: Two times a day (BID) | ORAL | 0 refills | Status: DC
  Filled 2023-11-15: qty 14, 7d supply, fill #0

## 2023-11-15 MED ORDER — VITAMIN D3 25 MCG PO TABS
2000.0000 [IU] | ORAL_TABLET | Freq: Every day | ORAL | 0 refills | Status: AC
  Filled 2023-11-15: qty 180, 90d supply, fill #0

## 2023-11-15 MED ORDER — OXYCODONE HCL ER 20 MG PO T12A: 20.0000 mg | EXTENDED_RELEASE_TABLET | Freq: Two times a day (BID) | ORAL | 0 refills | Status: DC

## 2023-11-15 MED ORDER — IBUPROFEN 800 MG PO TABS: 800.0000 mg | ORAL_TABLET | Freq: Three times a day (TID) | ORAL | Status: AC | PRN

## 2023-11-15 MED ORDER — FOLIC ACID 1 MG PO TABS: 1.0000 mg | ORAL_TABLET | Freq: Every day | ORAL | Status: DC
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
## 2023-11-15 NOTE — Plan of Care (Signed)
  Problem: Pain Managment: Goal: General experience of comfort will improve and/or be controlled Outcome: Progressing   Problem: Safety: Goal: Ability to remain free from injury will improve Outcome: Progressing
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
## 2023-11-15 NOTE — TOC CM/SW Note (Cosign Needed)
    Durable Medical Equipment  (From admission, onward)           Start     Ordered   11/15/23 1046  For home use only DME Bedside commode  Once       Question:  Patient needs a bedside commode to treat with the following condition  Answer:  Weakness   11/15/23 1045   11/15/23 1043  For home use only DME standard manual wheelchair with seat cushion  Once       Comments: Patient suffers from  s/p Closed reduction, traction pin L tibia 6/19, s/p ORIF and Left sciatic nerve neuroplasty/decompression 6/23, NWB to LLE. PRAFO for foot drop. L, which impairs their ability to perform daily activities like ambulating  in the home.  A cane  will not resolve issue with performing activities of daily living. A wheelchair will allow patient to safely perform daily activities. Patient can safely propel the wheelchair in the home or has a caregiver who can provide assistance. Length of need lifetime . Accessories: elevating leg rests (ELRs), wheel locks, extensions and anti-tippers.  Seat and back cushions  removable leg rests   11/15/23 1045           Patient confined to a room with no bathroom therefore needs bedside commode

## 2023-11-15 NOTE — Plan of Care (Signed)
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
## 2023-11-15 NOTE — Progress Notes (Signed)
 Patient left the unit accompanied by PTAR.  Discharge orders given via dayshift nurse.

## 2023-11-15 NOTE — Progress Notes (Signed)
 Inpatient Rehab Admissions Coordinator:   Notified by insurance that appeal complete and denial of CIR services is upheld.  Therapy to see and determine next best venue of care.  We will sign off.  Team updated.    [name], PT, DPT Admissions Coordinator [phone] 11/15/23  3:00 PM

## 2023-11-16 ENCOUNTER — Other Ambulatory Visit: Payer: Self-pay | Admitting: Family

## 2023-11-16 ENCOUNTER — Telehealth: Payer: Self-pay | Admitting: *Deleted

## 2023-11-16 DIAGNOSIS — F319 Bipolar disorder, unspecified: Secondary | ICD-10-CM

## 2023-11-16 DIAGNOSIS — F39 Unspecified mood [affective] disorder: Secondary | ICD-10-CM

## 2023-11-16 DIAGNOSIS — F321 Major depressive disorder, single episode, moderate: Secondary | ICD-10-CM

## 2023-11-16 DIAGNOSIS — F411 Generalized anxiety disorder: Secondary | ICD-10-CM
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
## 2023-11-19 ENCOUNTER — Other Ambulatory Visit: Payer: Self-pay | Admitting: Family

## 2023-11-19 DIAGNOSIS — K219 Gastro-esophageal reflux disease without esophagitis: Secondary | ICD-10-CM

## 2023-11-22 ENCOUNTER — Other Ambulatory Visit: Payer: Self-pay | Admitting: Physician Assistant

## 2023-11-22 DIAGNOSIS — D72829 Elevated white blood cell count, unspecified: Secondary | ICD-10-CM

## 2023-11-22 DIAGNOSIS — D62 Acute posthemorrhagic anemia: Secondary | ICD-10-CM

## 2023-11-24 ENCOUNTER — Emergency Department (HOSPITAL_COMMUNITY)

## 2023-11-24 ENCOUNTER — Encounter (HOSPITAL_COMMUNITY): Payer: Self-pay

## 2023-11-24 ENCOUNTER — Emergency Department (HOSPITAL_COMMUNITY)
Admission: EM | Admit: 2023-11-24 | Discharge: 2023-11-24 | Disposition: A | Discharge status: Home / Self Care | Attending: Student | Admitting: Student

## 2023-11-24 ENCOUNTER — Other Ambulatory Visit: Payer: Self-pay

## 2023-11-24 DIAGNOSIS — M25552 Pain in left hip: Secondary | ICD-10-CM | POA: Diagnosis not present

## 2023-11-24 DIAGNOSIS — Z7901 Long term (current) use of anticoagulants: Secondary | ICD-10-CM | POA: Diagnosis not present

## 2023-11-24 DIAGNOSIS — M7989 Other specified soft tissue disorders: Secondary | ICD-10-CM | POA: Diagnosis not present

## 2023-11-24 DIAGNOSIS — R0789 Other chest pain: Secondary | ICD-10-CM | POA: Diagnosis not present

## 2023-11-24 DIAGNOSIS — R600 Localized edema: Secondary | ICD-10-CM | POA: Insufficient documentation

## 2023-11-24 DIAGNOSIS — Z7982 Long term (current) use of aspirin: Secondary | ICD-10-CM | POA: Insufficient documentation

## 2023-11-24 DIAGNOSIS — S32402A Unspecified fracture of left acetabulum, initial encounter for closed fracture: Secondary | ICD-10-CM | POA: Diagnosis not present

## 2023-11-24 DIAGNOSIS — I10 Essential (primary) hypertension: Secondary | ICD-10-CM | POA: Diagnosis not present

## 2023-11-24 LAB — COMPREHENSIVE METABOLIC PANEL WITH GFR
ALT: 19 U/L (ref 0–44)
AST: 16 U/L (ref 15–41)
Albumin: 3.3 g/dL — ABNORMAL LOW (ref 3.5–5.0)
Alkaline Phosphatase: 114 U/L (ref 38–126)
Anion gap: 10 (ref 5–15)
BUN: 14 mg/dL (ref 6–20)
CO2: 26 mmol/L (ref 22–32)
Calcium: 8.8 mg/dL — ABNORMAL LOW (ref 8.9–10.3)
Chloride: 101 mmol/L (ref 98–111)
Creatinine, Ser: 0.49 mg/dL (ref 0.44–1.00)
GFR, Estimated: >60 mL/min (ref >60)
Glucose, Bld: 89 mg/dL (ref 70–99)
Potassium: 3.7 mmol/L (ref 3.5–5.1)
Sodium: 137 mmol/L (ref 135–145)
Total Bilirubin: 0.3 mg/dL (ref 0.0–1.2)
Total Protein: 7.1 g/dL (ref 6.5–8.1)

## 2023-11-24 LAB — CBC WITH DIFFERENTIAL/PLATELET
Abs Immature Granulocytes: 0.02 K/uL (ref 0.00–0.07)
Basophils Absolute: 0 K/uL (ref 0.0–0.1)
Basophils Relative: 0 %
Eosinophils Absolute: 0.3 K/uL (ref 0.0–0.5)
Eosinophils Relative: 4 %
HCT: 27.7 % — ABNORMAL LOW (ref 36.0–46.0)
Hemoglobin: 8.3 g/dL — ABNORMAL LOW (ref 12.0–15.0)
Immature Granulocytes: 0 %
Lymphocytes Relative: 28 %
Lymphs Abs: 1.9 K/uL (ref 0.7–4.0)
MCH: 26.2 pg (ref 26.0–34.0)
MCHC: 30 g/dL (ref 30.0–36.0)
MCV: 87.4 fL (ref 80.0–100.0)
Monocytes Absolute: 0.4 K/uL (ref 0.1–1.0)
Monocytes Relative: 6 %
Neutro Abs: 4.2 K/uL (ref 1.7–7.7)
Neutrophils Relative %: 62 %
Platelets: 267 K/uL (ref 150–400)
RBC: 3.17 MIL/uL — ABNORMAL LOW (ref 3.87–5.11)
RDW: 21.7 % — ABNORMAL HIGH (ref 11.5–15.5)
WBC: 6.8 K/uL (ref 4.0–10.5)
nRBC: 0 % (ref 0.0–0.2)

## 2023-11-24 LAB — BRAIN NATRIURETIC PEPTIDE: B Natriuretic Peptide: 20 pg/mL (ref 0.0–100.0)

## 2023-11-24 MED ORDER — OXYCODONE HCL 5 MG PO TABS
5.0000 mg | ORAL_TABLET | Freq: Once | ORAL | Status: AC
  Administered 2023-11-24: 5 mg via ORAL
  Filled 2023-11-24: qty 1
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
## 2023-11-24 NOTE — ED Notes (Addendum)
 Pt now c/o sharp, left sided chest pain when US  came to take her for her US . EKG order placed.
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
## 2023-11-25 ENCOUNTER — Telehealth: Payer: Self-pay | Admitting: *Deleted

## 2023-11-25 ENCOUNTER — Encounter: Payer: Self-pay | Admitting: *Deleted

## 2023-11-25 DIAGNOSIS — S88111S Complete traumatic amputation at level between knee and ankle, right lower leg, sequela: Secondary | ICD-10-CM | POA: Diagnosis not present

## 2023-11-25 DIAGNOSIS — M4135 Thoracogenic scoliosis, thoracolumbar region: Secondary | ICD-10-CM | POA: Diagnosis not present

## 2023-11-25 DIAGNOSIS — M797 Fibromyalgia: Secondary | ICD-10-CM | POA: Diagnosis not present

## 2023-11-25 DIAGNOSIS — M546 Pain in thoracic spine: Secondary | ICD-10-CM | POA: Diagnosis not present

## 2023-11-26 ENCOUNTER — Encounter: Payer: Self-pay | Admitting: *Deleted

## 2023-11-26 ENCOUNTER — Inpatient Hospital Stay: Admitting: Family

## 2023-11-29 ENCOUNTER — Encounter: Payer: Self-pay | Admitting: *Deleted

## 2023-11-30 ENCOUNTER — Encounter: Payer: Self-pay | Admitting: Family

## 2023-11-30 DIAGNOSIS — S32402D Unspecified fracture of left acetabulum, subsequent encounter for fracture with routine healing: Secondary | ICD-10-CM | POA: Diagnosis not present

## 2023-12-01 ENCOUNTER — Encounter (HOSPITAL_COMMUNITY): Payer: Self-pay

## 2023-12-01 ENCOUNTER — Other Ambulatory Visit

## 2023-12-01 ENCOUNTER — Emergency Department (HOSPITAL_COMMUNITY)

## 2023-12-01 ENCOUNTER — Emergency Department (HOSPITAL_COMMUNITY)
Admission: EM | Admit: 2023-12-01 | Discharge: 2023-12-01 | Disposition: A | Discharge status: Home / Self Care | Attending: Emergency Medicine | Admitting: Emergency Medicine

## 2023-12-01 ENCOUNTER — Other Ambulatory Visit: Payer: Self-pay

## 2023-12-01 DIAGNOSIS — S72002A Fracture of unspecified part of neck of left femur, initial encounter for closed fracture: Secondary | ICD-10-CM | POA: Diagnosis not present

## 2023-12-01 DIAGNOSIS — S32452A Displaced transverse fracture of left acetabulum, initial encounter for closed fracture: Secondary | ICD-10-CM | POA: Diagnosis not present

## 2023-12-01 DIAGNOSIS — M25552 Pain in left hip: Secondary | ICD-10-CM | POA: Insufficient documentation

## 2023-12-01 DIAGNOSIS — Z7982 Long term (current) use of aspirin: Secondary | ICD-10-CM | POA: Diagnosis not present

## 2023-12-01 DIAGNOSIS — Z7901 Long term (current) use of anticoagulants: Secondary | ICD-10-CM | POA: Insufficient documentation

## 2023-12-01 MED ORDER — HYDROMORPHONE HCL 1 MG/ML IJ SOLN
1.0000 mg | Freq: Once | INTRAMUSCULAR | Status: AC
  Administered 2023-12-01: 1 mg via INTRAMUSCULAR
  Filled 2023-12-01: qty 1

## 2023-12-01 MED ORDER — OXYCODONE-ACETAMINOPHEN 5-325 MG PO TABS
2.0000 | ORAL_TABLET | Freq: Once | ORAL | Status: AC
  Administered 2023-12-01: 2 via ORAL
  Filled 2023-12-01: qty 2

## 2023-12-01 NOTE — ED Triage Notes (Signed)
 Pt from home complains of post op problem with left hip/ leg, pt states that her hip joint keeps popping out of place causing 9/10 pain. Pt AAOx4, able to move extremities on affected leg.

## 2023-12-01 NOTE — Discharge Instructions (Signed)
 Your orthopedic doctor will call you later today and discussed the results of your CT.  Continue taking your medicines as prescribed
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
## 2023-12-08 ENCOUNTER — Encounter: Payer: Self-pay | Admitting: Physician Assistant

## 2023-12-08 ENCOUNTER — Inpatient Hospital Stay: Attending: Oncology | Admitting: Physician Assistant

## 2023-12-08 ENCOUNTER — Other Ambulatory Visit (HOSPITAL_COMMUNITY): Payer: Self-pay | Admitting: Student

## 2023-12-08 ENCOUNTER — Inpatient Hospital Stay

## 2023-12-08 DIAGNOSIS — M25552 Pain in left hip: Secondary | ICD-10-CM

## 2023-12-14 ENCOUNTER — Encounter (HOSPITAL_COMMUNITY): Payer: Self-pay

## 2023-12-14 ENCOUNTER — Ambulatory Visit (HOSPITAL_COMMUNITY)

## 2023-12-14 ENCOUNTER — Emergency Department (HOSPITAL_COMMUNITY)

## 2023-12-14 ENCOUNTER — Inpatient Hospital Stay (HOSPITAL_COMMUNITY)
Admission: EM | Admit: 2023-12-14 | Discharge: 2023-12-20 | DRG: 536 | Disposition: A | Discharge status: Skilled Nursing Facility | Attending: Internal Medicine | Admitting: Internal Medicine

## 2023-12-14 ENCOUNTER — Other Ambulatory Visit: Payer: Self-pay

## 2023-12-14 DIAGNOSIS — J45909 Unspecified asthma, uncomplicated: Secondary | ICD-10-CM | POA: Diagnosis present

## 2023-12-14 DIAGNOSIS — Z8249 Family history of ischemic heart disease and other diseases of the circulatory system: Secondary | ICD-10-CM

## 2023-12-14 DIAGNOSIS — Z87442 Personal history of urinary calculi: Secondary | ICD-10-CM | POA: Diagnosis not present

## 2023-12-14 DIAGNOSIS — I10 Essential (primary) hypertension: Secondary | ICD-10-CM | POA: Diagnosis present

## 2023-12-14 DIAGNOSIS — Z888 Allergy status to other drugs, medicaments and biological substances status: Secondary | ICD-10-CM

## 2023-12-14 DIAGNOSIS — Z043 Encounter for examination and observation following other accident: Secondary | ICD-10-CM | POA: Diagnosis not present

## 2023-12-14 DIAGNOSIS — F1010 Alcohol abuse, uncomplicated: Secondary | ICD-10-CM | POA: Diagnosis present

## 2023-12-14 DIAGNOSIS — S72052A Unspecified fracture of head of left femur, initial encounter for closed fracture: Secondary | ICD-10-CM | POA: Diagnosis not present

## 2023-12-14 DIAGNOSIS — Z7982 Long term (current) use of aspirin: Secondary | ICD-10-CM

## 2023-12-14 DIAGNOSIS — S73005A Unspecified dislocation of left hip, initial encounter: Secondary | ICD-10-CM | POA: Diagnosis present

## 2023-12-14 DIAGNOSIS — Z8616 Personal history of COVID-19: Secondary | ICD-10-CM

## 2023-12-14 DIAGNOSIS — R739 Hyperglycemia, unspecified: Secondary | ICD-10-CM

## 2023-12-14 DIAGNOSIS — F319 Bipolar disorder, unspecified: Secondary | ICD-10-CM | POA: Diagnosis present

## 2023-12-14 DIAGNOSIS — F17210 Nicotine dependence, cigarettes, uncomplicated: Secondary | ICD-10-CM | POA: Diagnosis present

## 2023-12-14 DIAGNOSIS — R918 Other nonspecific abnormal finding of lung field: Secondary | ICD-10-CM | POA: Diagnosis not present

## 2023-12-14 DIAGNOSIS — Z833 Family history of diabetes mellitus: Secondary | ICD-10-CM

## 2023-12-14 DIAGNOSIS — D649 Anemia, unspecified: Secondary | ICD-10-CM | POA: Diagnosis not present

## 2023-12-14 DIAGNOSIS — Z981 Arthrodesis status: Secondary | ICD-10-CM | POA: Diagnosis not present

## 2023-12-14 DIAGNOSIS — E876 Hypokalemia: Secondary | ICD-10-CM | POA: Diagnosis present

## 2023-12-14 DIAGNOSIS — F39 Unspecified mood [affective] disorder: Secondary | ICD-10-CM | POA: Diagnosis not present

## 2023-12-14 DIAGNOSIS — Z89511 Acquired absence of right leg below knee: Secondary | ICD-10-CM | POA: Diagnosis not present

## 2023-12-14 DIAGNOSIS — K5730 Diverticulosis of large intestine without perforation or abscess without bleeding: Secondary | ICD-10-CM | POA: Diagnosis not present

## 2023-12-14 DIAGNOSIS — M25552 Pain in left hip: Secondary | ICD-10-CM | POA: Diagnosis not present

## 2023-12-14 DIAGNOSIS — E66812 Obesity, class 2: Secondary | ICD-10-CM

## 2023-12-14 DIAGNOSIS — W19XXXA Unspecified fall, initial encounter: Secondary | ICD-10-CM | POA: Diagnosis present

## 2023-12-14 DIAGNOSIS — M419 Scoliosis, unspecified: Secondary | ICD-10-CM | POA: Diagnosis present

## 2023-12-14 DIAGNOSIS — M797 Fibromyalgia: Secondary | ICD-10-CM | POA: Diagnosis present

## 2023-12-14 DIAGNOSIS — Z825 Family history of asthma and other chronic lower respiratory diseases: Secondary | ICD-10-CM

## 2023-12-14 DIAGNOSIS — F17290 Nicotine dependence, other tobacco product, uncomplicated: Secondary | ICD-10-CM | POA: Diagnosis not present

## 2023-12-14 DIAGNOSIS — Z6841 Body Mass Index (BMI) 40.0 and over, adult: Secondary | ICD-10-CM | POA: Diagnosis not present

## 2023-12-14 DIAGNOSIS — Z7901 Long term (current) use of anticoagulants: Secondary | ICD-10-CM | POA: Diagnosis not present

## 2023-12-14 DIAGNOSIS — S72052K Unspecified fracture of head of left femur, subsequent encounter for closed fracture with nonunion: Secondary | ICD-10-CM | POA: Diagnosis not present

## 2023-12-14 DIAGNOSIS — S32402A Unspecified fracture of left acetabulum, initial encounter for closed fracture: Secondary | ICD-10-CM | POA: Diagnosis not present

## 2023-12-14 DIAGNOSIS — G8929 Other chronic pain: Secondary | ICD-10-CM

## 2023-12-14 LAB — CBC WITH DIFFERENTIAL/PLATELET
Abs Immature Granulocytes: 0.03 K/uL (ref 0.00–0.07)
Basophils Absolute: 0 K/uL (ref 0.0–0.1)
Basophils Relative: 0 %
Eosinophils Absolute: 0.1 K/uL (ref 0.0–0.5)
Eosinophils Relative: 1 %
HCT: 30.5 % — ABNORMAL LOW (ref 36.0–46.0)
Hemoglobin: 9.5 g/dL — ABNORMAL LOW (ref 12.0–15.0)
Immature Granulocytes: 0 %
Lymphocytes Relative: 21 %
Lymphs Abs: 2 K/uL (ref 0.7–4.0)
MCH: 26.9 pg (ref 26.0–34.0)
MCHC: 31.1 g/dL (ref 30.0–36.0)
MCV: 86.4 fL (ref 80.0–100.0)
Monocytes Absolute: 0.6 K/uL (ref 0.1–1.0)
Monocytes Relative: 6 %
Neutro Abs: 6.8 K/uL (ref 1.7–7.7)
Neutrophils Relative %: 72 %
Platelets: 429 K/uL — ABNORMAL HIGH (ref 150–400)
RBC: 3.53 MIL/uL — ABNORMAL LOW (ref 3.87–5.11)
RDW: 18.6 % — ABNORMAL HIGH (ref 11.5–15.5)
WBC: 9.4 K/uL (ref 4.0–10.5)
nRBC: 0 % (ref 0.0–0.2)

## 2023-12-14 LAB — BASIC METABOLIC PANEL WITH GFR
Anion gap: 11 (ref 5–15)
BUN: 5 mg/dL — ABNORMAL LOW (ref 6–20)
CO2: 28 mmol/L (ref 22–32)
Calcium: 8.7 mg/dL — ABNORMAL LOW (ref 8.9–10.3)
Chloride: 101 mmol/L (ref 98–111)
Creatinine, Ser: 0.33 mg/dL — ABNORMAL LOW (ref 0.44–1.00)
GFR, Estimated: >60 mL/min (ref >60)
Glucose, Bld: 103 mg/dL — ABNORMAL HIGH (ref 70–99)
Potassium: 2.5 mmol/L — CL (ref 3.5–5.1)
Sodium: 140 mmol/L (ref 135–145)

## 2023-12-14 LAB — HCG, SERUM, QUALITATIVE: Preg, Serum: NEGATIVE

## 2023-12-14 MED ORDER — OXYCODONE-ACETAMINOPHEN 5-325 MG PO TABS
1.0000 | ORAL_TABLET | Freq: Once | ORAL | Status: AC
  Administered 2023-12-14: 1 via ORAL
  Filled 2023-12-14: qty 1

## 2023-12-14 MED ORDER — KETOROLAC TROMETHAMINE 15 MG/ML IJ SOLN
15.0000 mg | Freq: Once | INTRAMUSCULAR | Status: AC
  Administered 2023-12-14: 15 mg via INTRAVENOUS
  Filled 2023-12-14: qty 1

## 2023-12-14 MED ORDER — POTASSIUM CHLORIDE CRYS ER 20 MEQ PO TBCR
40.0000 meq | EXTENDED_RELEASE_TABLET | Freq: Once | ORAL | Status: AC
  Administered 2023-12-14: 40 meq via ORAL
  Filled 2023-12-14: qty 2

## 2023-12-14 MED ORDER — SODIUM CHLORIDE 0.9 % IV SOLN: INTRAVENOUS | Status: AC

## 2023-12-14 MED ORDER — POTASSIUM CHLORIDE 10 MEQ/100ML IV SOLN
10.0000 meq | INTRAVENOUS | Status: AC
  Administered 2023-12-14 – 2023-12-15 (×6): 10 meq via INTRAVENOUS
  Filled 2023-12-14 (×6): qty 100

## 2023-12-14 MED ORDER — HYDROMORPHONE HCL 1 MG/ML IJ SOLN
0.5000 mg | Freq: Once | INTRAMUSCULAR | Status: AC
  Administered 2023-12-15: 0.5 mg via INTRAVENOUS
  Filled 2023-12-14: qty 0.5

## 2023-12-14 MED ORDER — HYDROMORPHONE HCL 1 MG/ML IJ SOLN
0.5000 mg | Freq: Once | INTRAMUSCULAR | Status: AC
  Administered 2023-12-14: 0.5 mg via INTRAVENOUS
  Filled 2023-12-14: qty 0.5
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
## 2023-12-14 NOTE — ED Provider Notes (Signed)
 [place] EMERGENCY DEPARTMENT AT [facility] Provider Note   CSN: [id number] Arrival date & time: 12/14/23  1845     Patient presents with: Fall and Hip Pain   Taylor [name] is a 33 y.o. female.  {Add pertinent medical, surgical, social history, OB history to YEP:67052}  Fall  Hip Pain  Patient presents after fall.  Medical history includes***.  She states that she has had multiple surgeries to her left hip.  She was seen in the ED**.  Today, she had 2 falls.  She was on the floor for several hours.  She denies tracking her head.  She has since had worsening left hip pain.  Patient denies any other areas of discomfort.  EMS gave 100 mcg of fentanyl  prior to arrival.***     Prior to Admission medications   Medication Sig Start Date End Date Taking? Authorizing Provider  acetaminophen  (TYLENOL ) 500 MG tablet Take 2 tablets (1,000 mg total) by mouth every 8 (eight) hours as needed. 11/15/23   Maczis, Michael M, PA-C  apixaban  (ELIQUIS ) 2.5 MG TABS tablet Take 1 tablet (2.5 mg total) by mouth 2 (two) times daily. 11/15/23   Maczis, Michael M, PA-C  ascorbic acid  (VITAMIN C ) 500 MG tablet Take 1 tablet (500 mg total) by mouth daily. 11/15/23   Maczis, Michael M, PA-C  aspirin  EC 81 MG tablet Take 81 mg by mouth daily.    [provider]  buPROPion  (WELLBUTRIN  XL) 300 MG 24 hr tablet Take 1 tablet (300 mg total) by mouth daily. **NEEDS TO BE SEEN BEFORE NEXT REFILL** 09/30/23   Lavell Lye A, FNP  chlorhexidine  (HIBICLENS ) 4 % external liquid Apply 15 mLs (1 Application total) topically as directed for 30 doses. Use as directed daily for 5 days every other week for 6 weeks. 10/29/23   Danton Lauraine LABOR, PA-C  docusate sodium  (COLACE) 100 MG capsule Take 1 capsule (100 mg total) by mouth 2 (two) times daily as needed for mild constipation. 11/15/23   Maczis, Michael M, PA-C  DULoxetine  (CYMBALTA ) 60 MG capsule Take 2 capsules (120 mg total) by mouth daily. **NEEDS TO BE SEEN  BEFORE NEXT REFILL** 11/16/23   Lavell Lye LABOR, FNP  ferrous sulfate  325 (65 FE) MG tablet Take 1 tablet (325 mg total) by mouth daily with breakfast. 11/16/23   Maczis, Michael M, PA-C  folic acid  (FOLVITE ) 1 MG tablet Take 1 tablet (1 mg total) by mouth daily. 11/16/23   Maczis, Michael M, PA-C  methocarbamol  (ROBAXIN ) 500 MG tablet Take 2 tablets (1,000 mg total) by mouth every 6 (six) hours as needed for muscle spasms. 11/15/23   Maczis, Michael M, PA-C  Multiple Vitamin (MULTIVITAMIN WITH MINERALS) TABS tablet Take 1 tablet by mouth daily. 11/16/23   Maczis, Michael M, PA-C  NON FORMULARY IUD, uncertain which one    [provider]  oxyCODONE  (OXY IR/ROXICODONE ) 5 MG immediate release tablet Take 1 tablet (5 mg total) by mouth every 6 (six) hours as needed for breakthrough pain. 11/15/23   Maczis, Michael M, PA-C  oxyCODONE  (OXYCONTIN ) 20 mg 12 hr tablet Take 1 tablet (20 mg total) by mouth every 12 (twelve) hours. 11/15/23   Maczis, Michael M, PA-C  pantoprazole  (PROTONIX ) 40 MG tablet TAKE 1 TABLET(40 MG) BY MOUTH TWICE DAILY 11/19/23   Lavell Lye A, FNP  polyethylene glycol (MIRALAX  / GLYCOLAX ) 17 g packet Take 17 g by mouth 2 (two) times daily as needed. 11/15/23   Maczis, Michael  M, PA-C  pregabalin  (LYRICA ) 150 MG capsule Take 1 capsule (150 mg total) by mouth 3 (three) times daily. Start with 1 capsule twice a day for 3, if tolerating and pain is not controlled can increase to three times a day 09/13/23   Urbano Albright, MD  thiamine  (VITAMIN B-1) 100 MG tablet Take 1 tablet (100 mg total) by mouth daily. 11/16/23   Maczis, Michael M, PA-C  traMADol  (ULTRAM ) 50 MG tablet Take 2 tablets (100 mg total) by mouth every 6 (six) hours as needed for moderate pain (pain score 4-6) or severe pain (pain score 7-10). 11/15/23   Maczis, Ozell HERO, PA-C  traZODone  (DESYREL ) 100 MG tablet Take 1 tablet (100 mg total) by mouth at bedtime. 03/04/23   Lavell Bari LABOR, FNP  vitamin D3 (CHOLECALCIFEROL ) 25 MCG  tablet Take 2 tablets (2,000 Units total) by mouth daily. 11/16/23 02/14/24  Maczis, Michael M, PA-C  VRAYLAR  1.5 MG capsule Take 1 capsule (1.5 mg total) by mouth daily. 03/04/23   Lavell Bari LABOR, FNP    Allergies: Other and Morphine     Review of Systems  Updated Vital Signs Pulse 86   Resp 18   Ht 5' 4 (1.626 m)   Wt 125.3 kg   SpO2 97%   BMI 47.42 kg/m   Physical Exam  (all labs ordered are listed, but only abnormal results are displayed) Labs Reviewed - No data to display  EKG: None  Radiology: No results found.  {Document cardiac monitor, telemetry assessment procedure when appropriate:32947} Procedures   Medications Ordered in the ED  oxyCODONE -acetaminophen  (PERCOCET/ROXICET) 5-325 MG per tablet 1 tablet (has no administration in time range)      {Click here for ABCD2, HEART and other calculators REFRESH Note before signing:1}                              Medical Decision Making Amount and/or Complexity of Data Reviewed Radiology: ordered.  Risk Prescription drug management.   ***  {Document critical care time when appropriate  Document review of labs and clinical decision tools ie CHADS2VASC2, etc  Document your independent review of radiology images and any outside records  Document your discussion with family members, caretakers and with consultants  Document social determinants of health affecting pt's care  Document your decision making why or why not admission, treatments were needed:32947:::1}   Final diagnoses:  None    ED Discharge Orders     None

## 2023-12-15 ENCOUNTER — Emergency Department (HOSPITAL_COMMUNITY)

## 2023-12-15 DIAGNOSIS — M419 Scoliosis, unspecified: Secondary | ICD-10-CM | POA: Diagnosis present

## 2023-12-15 DIAGNOSIS — F319 Bipolar disorder, unspecified: Secondary | ICD-10-CM | POA: Diagnosis present

## 2023-12-15 DIAGNOSIS — Z8616 Personal history of COVID-19: Secondary | ICD-10-CM | POA: Diagnosis not present

## 2023-12-15 DIAGNOSIS — K5730 Diverticulosis of large intestine without perforation or abscess without bleeding: Secondary | ICD-10-CM | POA: Diagnosis not present

## 2023-12-15 DIAGNOSIS — W19XXXA Unspecified fall, initial encounter: Secondary | ICD-10-CM | POA: Diagnosis present

## 2023-12-15 DIAGNOSIS — S72052A Unspecified fracture of head of left femur, initial encounter for closed fracture: Secondary | ICD-10-CM | POA: Diagnosis present

## 2023-12-15 DIAGNOSIS — Z981 Arthrodesis status: Secondary | ICD-10-CM | POA: Diagnosis not present

## 2023-12-15 DIAGNOSIS — E876 Hypokalemia: Secondary | ICD-10-CM

## 2023-12-15 DIAGNOSIS — F17210 Nicotine dependence, cigarettes, uncomplicated: Secondary | ICD-10-CM | POA: Diagnosis present

## 2023-12-15 DIAGNOSIS — Z6841 Body Mass Index (BMI) 40.0 and over, adult: Secondary | ICD-10-CM | POA: Diagnosis not present

## 2023-12-15 DIAGNOSIS — J45909 Unspecified asthma, uncomplicated: Secondary | ICD-10-CM | POA: Diagnosis present

## 2023-12-15 DIAGNOSIS — S72052K Unspecified fracture of head of left femur, subsequent encounter for closed fracture with nonunion: Secondary | ICD-10-CM | POA: Diagnosis not present

## 2023-12-15 DIAGNOSIS — F1010 Alcohol abuse, uncomplicated: Secondary | ICD-10-CM | POA: Diagnosis present

## 2023-12-15 DIAGNOSIS — F17290 Nicotine dependence, other tobacco product, uncomplicated: Secondary | ICD-10-CM | POA: Diagnosis not present

## 2023-12-15 DIAGNOSIS — Z87442 Personal history of urinary calculi: Secondary | ICD-10-CM | POA: Diagnosis not present

## 2023-12-15 DIAGNOSIS — M797 Fibromyalgia: Secondary | ICD-10-CM | POA: Diagnosis present

## 2023-12-15 DIAGNOSIS — I10 Essential (primary) hypertension: Secondary | ICD-10-CM | POA: Diagnosis present

## 2023-12-15 DIAGNOSIS — Z8249 Family history of ischemic heart disease and other diseases of the circulatory system: Secondary | ICD-10-CM | POA: Diagnosis not present

## 2023-12-15 DIAGNOSIS — S73005A Unspecified dislocation of left hip, initial encounter: Secondary | ICD-10-CM | POA: Diagnosis present

## 2023-12-15 DIAGNOSIS — Z825 Family history of asthma and other chronic lower respiratory diseases: Secondary | ICD-10-CM | POA: Diagnosis not present

## 2023-12-15 DIAGNOSIS — Z7901 Long term (current) use of anticoagulants: Secondary | ICD-10-CM | POA: Diagnosis not present

## 2023-12-15 DIAGNOSIS — F39 Unspecified mood [affective] disorder: Secondary | ICD-10-CM | POA: Diagnosis not present

## 2023-12-15 DIAGNOSIS — Z833 Family history of diabetes mellitus: Secondary | ICD-10-CM | POA: Diagnosis not present

## 2023-12-15 DIAGNOSIS — Z7982 Long term (current) use of aspirin: Secondary | ICD-10-CM | POA: Diagnosis not present

## 2023-12-15 DIAGNOSIS — E66812 Obesity, class 2: Secondary | ICD-10-CM | POA: Diagnosis not present

## 2023-12-15 DIAGNOSIS — Z89511 Acquired absence of right leg below knee: Secondary | ICD-10-CM | POA: Diagnosis not present

## 2023-12-15 DIAGNOSIS — Z888 Allergy status to other drugs, medicaments and biological substances status: Secondary | ICD-10-CM | POA: Diagnosis not present

## 2023-12-15 MED ORDER — DULOXETINE HCL 60 MG PO CPEP
120.0000 mg | ORAL_CAPSULE | Freq: Every day | ORAL | Status: DC
  Administered 2023-12-15 – 2023-12-20 (×7): 120 mg via ORAL
  Filled 2023-12-15 (×6): qty 2

## 2023-12-15 MED ORDER — THIAMINE HCL 100 MG/ML IJ SOLN: 100.0000 mg | Freq: Every day | INTRAMUSCULAR | Status: DC

## 2023-12-15 MED ORDER — FERROUS SULFATE 325 (65 FE) MG PO TABS
325.0000 mg | ORAL_TABLET | Freq: Every day | ORAL | Status: DC
  Administered 2023-12-16 – 2023-12-20 (×6): 325 mg via ORAL
  Filled 2023-12-15 (×5): qty 1

## 2023-12-15 MED ORDER — PROPOFOL 10 MG/ML IV BOLUS
0.5000 mg/kg | Freq: Once | INTRAVENOUS | Status: AC
  Administered 2023-12-15: 62.7 mg via INTRAVENOUS
  Filled 2023-12-15: qty 20

## 2023-12-15 MED ORDER — ADULT MULTIVITAMIN W/MINERALS CH
1.0000 | ORAL_TABLET | Freq: Every day | ORAL | Status: DC
  Administered 2023-12-15 – 2023-12-20 (×7): 1 via ORAL
  Filled 2023-12-15 (×6): qty 1

## 2023-12-15 MED ORDER — PANTOPRAZOLE SODIUM 40 MG PO TBEC
40.0000 mg | DELAYED_RELEASE_TABLET | Freq: Every day | ORAL | Status: DC
  Administered 2023-12-15 – 2023-12-20 (×7): 40 mg via ORAL
  Filled 2023-12-15 (×6): qty 1

## 2023-12-15 MED ORDER — PREGABALIN 75 MG PO CAPS
150.0000 mg | ORAL_CAPSULE | Freq: Three times a day (TID) | ORAL | Status: DC
  Administered 2023-12-15 – 2023-12-20 (×16): 150 mg via ORAL
  Filled 2023-12-15 (×15): qty 2

## 2023-12-15 MED ORDER — THIAMINE MONONITRATE 100 MG PO TABS: 100.0000 mg | ORAL_TABLET | Freq: Every day | ORAL | Status: DC

## 2023-12-15 MED ORDER — ACETAMINOPHEN 325 MG PO TABS
650.0000 mg | ORAL_TABLET | Freq: Once | ORAL | Status: AC
  Administered 2023-12-15: 650 mg via ORAL
  Filled 2023-12-15: qty 2

## 2023-12-15 MED ORDER — DOCUSATE SODIUM 100 MG PO CAPS
100.0000 mg | ORAL_CAPSULE | Freq: Two times a day (BID) | ORAL | Status: DC | PRN
  Administered 2023-12-19 – 2023-12-20 (×3): 100 mg via ORAL
  Filled 2023-12-15 (×2): qty 1

## 2023-12-15 MED ORDER — HYDROMORPHONE HCL 1 MG/ML IJ SOLN
1.0000 mg | Freq: Once | INTRAMUSCULAR | Status: AC
  Administered 2023-12-15: 1 mg via INTRAVENOUS
  Filled 2023-12-15: qty 1

## 2023-12-15 MED ORDER — BUPROPION HCL ER (XL) 300 MG PO TB24
300.0000 mg | ORAL_TABLET | Freq: Every day | ORAL | Status: DC
  Administered 2023-12-15 – 2023-12-20 (×7): 300 mg via ORAL
  Filled 2023-12-15 (×6): qty 1

## 2023-12-15 MED ORDER — POLYETHYLENE GLYCOL 3350 17 G PO PACK
17.0000 g | PACK | Freq: Two times a day (BID) | ORAL | Status: DC
  Administered 2023-12-16 – 2023-12-20 (×8): 17 g via ORAL
  Filled 2023-12-15 (×9): qty 1

## 2023-12-15 MED ORDER — LORAZEPAM 2 MG/ML IJ SOLN: 1.0000 mg | INTRAMUSCULAR | Status: AC | PRN

## 2023-12-15 MED ORDER — ADULT MULTIVITAMIN W/MINERALS CH: 1.0000 | ORAL_TABLET | Freq: Every day | ORAL | Status: DC

## 2023-12-15 MED ORDER — FOLIC ACID 1 MG PO TABS
1.0000 mg | ORAL_TABLET | Freq: Every day | ORAL | Status: DC
  Administered 2023-12-15 – 2023-12-20 (×7): 1 mg via ORAL
  Filled 2023-12-15 (×6): qty 1

## 2023-12-15 MED ORDER — ACETAMINOPHEN 500 MG PO TABS
1000.0000 mg | ORAL_TABLET | Freq: Three times a day (TID) | ORAL | Status: DC
  Administered 2023-12-15 – 2023-12-20 (×16): 1000 mg via ORAL
  Filled 2023-12-15 (×15): qty 2

## 2023-12-15 MED ORDER — ONDANSETRON HCL 4 MG PO TABS: 4.0000 mg | ORAL_TABLET | Freq: Four times a day (QID) | ORAL | Status: DC | PRN

## 2023-12-15 MED ORDER — ONDANSETRON HCL 4 MG/2ML IJ SOLN: 4.0000 mg | Freq: Four times a day (QID) | INTRAMUSCULAR | Status: DC | PRN

## 2023-12-15 MED ORDER — PROPOFOL 10 MG/ML IV BOLUS
INTRAVENOUS | Status: AC | PRN
  Administered 2023-12-15: 30 mg via INTRAVENOUS

## 2023-12-15 MED ORDER — HYDROMORPHONE HCL 1 MG/ML IJ SOLN
0.5000 mg | Freq: Once | INTRAMUSCULAR | Status: AC
  Administered 2023-12-15: 0.5 mg via INTRAVENOUS
  Filled 2023-12-15: qty 0.5

## 2023-12-15 MED ORDER — METHOCARBAMOL 500 MG PO TABS
1000.0000 mg | ORAL_TABLET | Freq: Four times a day (QID) | ORAL | Status: DC | PRN
  Administered 2023-12-17 – 2023-12-20 (×12): 1000 mg via ORAL
  Filled 2023-12-15 (×10): qty 2

## 2023-12-15 MED ORDER — KETOROLAC TROMETHAMINE 15 MG/ML IJ SOLN
15.0000 mg | Freq: Four times a day (QID) | INTRAMUSCULAR | Status: AC
  Administered 2023-12-15 – 2023-12-16 (×5): 15 mg via INTRAVENOUS
  Filled 2023-12-15 (×5): qty 1

## 2023-12-15 MED ORDER — THIAMINE MONONITRATE 100 MG PO TABS
100.0000 mg | ORAL_TABLET | Freq: Every day | ORAL | Status: DC
  Administered 2023-12-15 – 2023-12-20 (×7): 100 mg via ORAL
  Filled 2023-12-15 (×6): qty 1

## 2023-12-15 MED ORDER — ENOXAPARIN SODIUM 40 MG/0.4ML IJ SOSY
40.0000 mg | PREFILLED_SYRINGE | INTRAMUSCULAR | Status: DC
  Administered 2023-12-15 – 2023-12-19 (×5): 40 mg via SUBCUTANEOUS
  Filled 2023-12-15 (×5): qty 0.4

## 2023-12-15 MED ORDER — FOLIC ACID 1 MG PO TABS: 1.0000 mg | ORAL_TABLET | Freq: Every day | ORAL | Status: DC

## 2023-12-15 MED ORDER — HYDROMORPHONE HCL 1 MG/ML IJ SOLN
0.5000 mg | INTRAMUSCULAR | Status: DC | PRN
  Administered 2023-12-15 – 2023-12-20 (×38): 0.5 mg via INTRAVENOUS
  Filled 2023-12-15 (×34): qty 0.5

## 2023-12-15 MED ORDER — LORAZEPAM 1 MG PO TABS: 1.0000 mg | ORAL_TABLET | ORAL | Status: AC | PRN

## 2023-12-15 NOTE — Progress Notes (Signed)
 Was at bedside for conscious sedation on hip reduction.  ETCO2 was in place as well as ambu and suction in room.  Patient tolerated procedure well.  Assisted RN in placing patient in knee immobilizer.  RN is still at bedside

## 2023-12-15 NOTE — ED Provider Notes (Signed)
 Care assumed from Dr. Melvenia, patient with left hip pain following fall. She has a history of acetabular fracture. She is pending CT of hip.  CT scan shows a left hip dislocation and associated displaced and comminuted left femoral head fracture.  In addition, there is an addendum on the hip x-ray stating there is a left posterior hip dislocation.  I have independently viewed all of these images, and agree with the radiologist's interpretation.  I will attempt reduction under procedural sedation.  Attempted reduction was unsuccessful, postreduction x-ray is unchanged from prereduction x-ray.  I have discussed case with Dr. Kendal who did her surgery and he will review the images and call me back.  Dr. Kendal has reviewed her images and states that the femoral head has been essentially destroyed and there is no treatment at this point other than waiting for the acetabulum to heal so that she can have a total hip replacement.  At this point, treatment is pain control.  Pain has been difficult to control, I plan to discuss with hospitalist to admit her for pain control and also to try to get her to an appropriate care facility.  I have discussed case with Dr. Charlton of [facility], who agrees to admit the patient.  .Sedation  Date/Time: 12/15/2023 4:47 AM  Performed by: Raford Lenis, MD Authorized by: Raford Lenis, MD   Consent:    Consent obtained:  Verbal   Consent given by:  Patient   Risks discussed:  Allergic reaction, dysrhythmia, inadequate sedation, nausea, prolonged hypoxia resulting in organ damage, prolonged sedation necessitating reversal, respiratory compromise necessitating ventilatory assistance and intubation and vomiting   Alternatives discussed:  Analgesia without sedation, anxiolysis and regional anesthesia Universal protocol:    Procedure explained and questions answered to patient or proxy's satisfaction: yes     Relevant documents present and verified: yes     Test results  available: yes     Imaging studies available: yes     Required blood products, implants, devices, and special equipment available: yes     Site/side marked: yes     Immediately prior to procedure, a time out was called: yes     Patient identity confirmed:  Verbally with patient Indications:    Procedure performed:  Dislocation reduction   Procedure necessitating sedation performed by:  Physician performing sedation Pre-sedation assessment:    Time since last food or drink:  12 cardiac hours   ASA classification: class 3 - patient with severe systemic disease     Mouth opening:  2 finger widths   Thyromental distance:  4 finger widths   Mallampati score:  I - soft palate, uvula, fauces, pillars visible   Neck mobility: normal     Pre-sedation assessments completed and reviewed: airway patency, cardiovascular function, hydration status, mental status, nausea/vomiting, pain level, respiratory function and temperature   A pre-sedation assessment was completed prior to the start of the procedure Immediate pre-procedure details:    Reassessment: Patient reassessed immediately prior to procedure     Reviewed: vital signs, relevant labs/tests and NPO status     Verified: bag valve mask available, emergency equipment available, intubation equipment available, IV patency confirmed, oxygen available and suction available   Procedure details (see MAR for exact dosages):    Preoxygenation:  Nasal cannula   Sedation:  Propofol    Intended level of sedation: deep   Intra-procedure monitoring:  Blood pressure monitoring, cardiac monitor, continuous pulse oximetry, frequent LOC assessments, frequent vital sign checks and  continuous capnometry   Intra-procedure events: none     Total Provider sedation time (minutes):  30 Post-procedure details:   A post-sedation assessment was completed following the completion of the procedure.   Attendance: Constant attendance by certified staff until patient recovered      Recovery: Patient returned to pre-procedure baseline     Post-sedation assessments completed and reviewed: airway patency, cardiovascular function, hydration status, mental status, nausea/vomiting, pain level, respiratory function and temperature     Patient is stable for discharge or admission: yes     Procedure completion:  Tolerated well, no immediate complications Comments:     Cardiac monitor showed normal sinus rhythm, per my interpretation. .Reduction of dislocation  Date/Time: 12/15/2023 5:35 AM  Performed by: Raford Lenis, MD Authorized by: Raford Lenis, MD  Consent: Written consent obtained Risks and benefits: risks, benefits and alternatives were discussed Consent given by: patient Patient understanding: patient states understanding of the procedure being performed Patient consent: the patient's understanding of the procedure matches consent given Procedure consent: procedure consent matches procedure scheduled Relevant documents: relevant documents present and verified Test results: test results available and properly labeled Site marked: the operative site was marked Imaging studies: imaging studies available Required items: required blood products, implants, devices, and special equipment available Patient identity confirmed: verbally with patient and arm band Time out: Immediately prior to procedure a time out was called to verify the correct patient, procedure, equipment, support staff and site/side marked as required. Local anesthesia used: no  Anesthesia: Local anesthesia used: no  Sedation: Patient sedated: yes Sedatives: propofol  Analgesia: hydromorphone  Sedation start date/time: 12/15/2023 4:40 AM Sedation end date/time: 12/15/2023 5:10 AM  Patient tolerance: patient tolerated the procedure well with no immediate complications Comments: Unsuccessful attempt at reduction        Raford Lenis, MD 12/15/23 0710

## 2023-12-15 NOTE — ED Notes (Signed)
 Patient transported to X-ray
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
## 2023-12-15 NOTE — Progress Notes (Signed)
 Ortho Trauma Note  I received a call from Dr. Raford earlier this morning regarding this patient.  She was supposed to follow-up with me on an outpatient basis to review CT scan that was performed a few weeks ago after she had a fall and what appeared to have loss of fixation.  She has been noncompliant with her weightbearing precautions postoperatively because she was not able to get admitted to inpatient rehab postoperatively so she went home and has had a difficult time.  She has had multiple falls and on the CT scan that was performed a few weeks ago she had loss of fixation of her posterior wall with subluxation of her femoral head with destruction of the posterior femoral head.  With the amount of destruction of the femoral head at that point I felt that there is no role for revision surgery as she would develop significant posttraumatic arthritis.  She had presented to the [facility] emergency room and I attempted to reach her by telephone the following day to review the CT scan to go over plan.  Unfortunately she did not answer and I have had her scheduled to see me in the office last week.  She did not come to this appointment and she was rescheduled for this week at which point she had another fall. CT scan and x-rays show worsening destruction of her femoral head and further subluxation. Unfortunately, there are no good options surgically at this point. She will need her acetabulum to heal prior to considering a total hip arthroplasty. I would expect this to be at least 3+ months from her surgery. There are no hip precautions or movement precautions and no need for bracing. I would recommend continued NWB to prevent further displacement of femur. She may be admitted at [facility] for disposition and can follow up with me in 2-3 weeks.  Franky MYRTIS Light, MD [facility] [phone] (office) [email or web address]

## 2023-12-16 DIAGNOSIS — S72052K Unspecified fracture of head of left femur, subsequent encounter for closed fracture with nonunion: Secondary | ICD-10-CM | POA: Diagnosis not present

## 2023-12-16 DIAGNOSIS — E66812 Obesity, class 2: Secondary | ICD-10-CM

## 2023-12-16 LAB — BASIC METABOLIC PANEL WITH GFR
Anion gap: 9 (ref 5–15)
BUN: 6 mg/dL (ref 6–20)
CO2: 28 mmol/L (ref 22–32)
Calcium: 8.9 mg/dL (ref 8.9–10.3)
Chloride: 100 mmol/L (ref 98–111)
Creatinine, Ser: 0.43 mg/dL — ABNORMAL LOW (ref 0.44–1.00)
GFR, Estimated: >60 mL/min (ref >60)
Glucose, Bld: 108 mg/dL — ABNORMAL HIGH (ref 70–99)
Potassium: 3.8 mmol/L (ref 3.5–5.1)
Sodium: 137 mmol/L (ref 135–145)

## 2023-12-16 LAB — CBC
HCT: 33.3 % — ABNORMAL LOW (ref 36.0–46.0)
Hemoglobin: 10.1 g/dL — ABNORMAL LOW (ref 12.0–15.0)
MCH: 26.4 pg (ref 26.0–34.0)
MCHC: 30.3 g/dL (ref 30.0–36.0)
MCV: 87.2 fL (ref 80.0–100.0)
Platelets: 379 K/uL (ref 150–400)
RBC: 3.82 MIL/uL — ABNORMAL LOW (ref 3.87–5.11)
RDW: 18.1 % — ABNORMAL HIGH (ref 11.5–15.5)
WBC: 6.9 K/uL (ref 4.0–10.5)
nRBC: 0 % (ref 0.0–0.2)

## 2023-12-16 LAB — MAGNESIUM: Magnesium: 1.9 mg/dL (ref 1.7–2.4)

## 2023-12-16 MED ORDER — NICOTINE 14 MG/24HR TD PT24
14.0000 mg | MEDICATED_PATCH | Freq: Every day | TRANSDERMAL | Status: DC
  Administered 2023-12-16 – 2023-12-20 (×6): 14 mg via TRANSDERMAL
  Filled 2023-12-16 (×5): qty 1

## 2023-12-16 NOTE — Plan of Care (Signed)
   Problem: Coping: Goal: Level of anxiety will decrease Outcome: Progressing

## 2023-12-16 NOTE — TOC CM/SW Note (Signed)
30 Day Note  To whom it May Concern: Please be advised that the above name patient will require a short-term nursing home stay- anticipated 30 days or less rehabilitation and strengthening. The plan is for return home.
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
## 2023-12-16 NOTE — TOC Progression Note (Signed)
 Transition of Care [facility]) - Progression Note    Patient Details  Name: Taylor [name] MRN: [id number] Date of Birth: May 21, 1990  [facility]) CM/SW Contact  Noreen KATHEE [place], [place] Phone Number: 12/16/2023, 1:33 PM  Clinical Narrative:     CSW spoke with patient at bedside regarding PT recommendation for SNF. Patient is agreeable to SNF ; CSW did explain to patient, that it was a guarantee that she would get accepted due to age, but attempt will be made. Patient understood. CSW did ask about OPPT , but stated that she wants it to be in home or a facility to where she has to stay and receive therapy due to transportation barriers. CSW sent referral out via HUB. TOC following.   Expected Discharge Plan: Skilled Nursing Facility Barriers to Discharge: Continued Medical Work up    Expected Discharge Plan and Services   Living arrangements for the past 2 months: Apartment     Social Drivers of Health (SDOH) Interventions SDOH Screenings   Food Insecurity: Food Insecurity Present (12/15/2023)  Housing: Low Risk  (12/15/2023)  Transportation Needs: No Transportation Needs (12/15/2023)  Recent Concern: Transportation Needs - Unmet Transportation Needs (11/16/2023)  Utilities: At Risk (12/15/2023)  Depression (PHQ2-9): High Risk (11/16/2023)  Financial Resource Strain: Medium Risk (11/11/2022)  Physical Activity: Unknown (11/11/2022)  Social Connections: Socially Isolated (12/15/2023)  Stress: Stress Concern Present (11/11/2022)  Tobacco Use: High Risk (12/14/2023)    Readmission Risk Interventions    12/16/2023   12:18 PM 12/16/2023    9:48 AM 12/15/2023    3:34 PM  Readmission Risk Prevention Plan  Transportation Screening Complete Complete Complete  PCP or Specialist Appt within 3-5 Days   Not Complete  HRI or Home Care Consult Complete Complete Complete  Social Work Consult for Recovery Care Planning/Counseling Complete Complete Complete  Palliative Care Screening Not Applicable Not  Applicable Not Applicable  Medication Review Oceanographer) Complete Patient Refused Complete

## 2023-12-16 NOTE — Plan of Care (Signed)
  Problem: Acute Rehab PT Goals(only PT should resolve) Goal: Pt Will Go Supine/Side To Sit Outcome: Progressing Flowsheets (Taken 12/16/2023 1435) Pt will go Supine/Side to Sit:  with supervision  with modified independence Goal: Patient Will Transfer Sit To/From Stand Outcome: Progressing Flowsheets (Taken 12/16/2023 1435) Patient will transfer sit to/from stand:  with contact guard assist  with minimal assist Goal: Pt Will Transfer Bed To Chair/Chair To Bed Outcome: Progressing Flowsheets (Taken 12/16/2023 1435) Pt will Transfer Bed to Chair/Chair to Bed:  with min assist  with contact guard assist Goal: Pt Will Ambulate Outcome: Progressing Flowsheets (Taken 12/16/2023 1435) Pt will Ambulate:  10 feet  with minimal assist  with moderate assist  with rolling walker   2:35 PM, 12/16/23 Lynwood Music, MPT Physical Therapist with [facility] [phone] office [phone] mobile phone

## 2023-12-17 DIAGNOSIS — E876 Hypokalemia: Secondary | ICD-10-CM | POA: Diagnosis not present

## 2023-12-17 DIAGNOSIS — E66812 Obesity, class 2: Secondary | ICD-10-CM | POA: Diagnosis not present

## 2023-12-17 DIAGNOSIS — S72052K Unspecified fracture of head of left femur, subsequent encounter for closed fracture with nonunion: Secondary | ICD-10-CM | POA: Diagnosis not present

## 2023-12-17 LAB — MAGNESIUM: Magnesium: 2.2 mg/dL (ref 1.7–2.4)

## 2023-12-17 LAB — BASIC METABOLIC PANEL WITH GFR
Anion gap: 10 (ref 5–15)
BUN: 9 mg/dL (ref 6–20)
CO2: 27 mmol/L (ref 22–32)
Calcium: 8.9 mg/dL (ref 8.9–10.3)
Chloride: 100 mmol/L (ref 98–111)
Creatinine, Ser: 0.4 mg/dL — ABNORMAL LOW (ref 0.44–1.00)
GFR, Estimated: >60 mL/min (ref >60)
Glucose, Bld: 97 mg/dL (ref 70–99)
Potassium: 3.9 mmol/L (ref 3.5–5.1)
Sodium: 137 mmol/L (ref 135–145)
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
## 2023-12-17 NOTE — Plan of Care (Signed)

## 2023-12-17 NOTE — Plan of Care (Signed)
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
## 2023-12-18 DIAGNOSIS — Z89511 Acquired absence of right leg below knee: Secondary | ICD-10-CM | POA: Diagnosis not present

## 2023-12-18 DIAGNOSIS — E876 Hypokalemia: Secondary | ICD-10-CM | POA: Diagnosis not present

## 2023-12-18 DIAGNOSIS — S72052K Unspecified fracture of head of left femur, subsequent encounter for closed fracture with nonunion: Secondary | ICD-10-CM | POA: Diagnosis not present

## 2023-12-18 DIAGNOSIS — E66812 Obesity, class 2: Secondary | ICD-10-CM | POA: Diagnosis not present

## 2023-12-18 NOTE — TOC CM/SW Note (Signed)
 Pt's shara has been under medical review since 0850 this morning. Have checked for updates by phone and [facility] site several times throughout the day.

## 2023-12-18 NOTE — Plan of Care (Signed)
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
## 2023-12-19 DIAGNOSIS — F17290 Nicotine dependence, other tobacco product, uncomplicated: Secondary | ICD-10-CM

## 2023-12-19 DIAGNOSIS — S72052K Unspecified fracture of head of left femur, subsequent encounter for closed fracture with nonunion: Secondary | ICD-10-CM | POA: Diagnosis not present
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
## 2023-12-19 NOTE — Progress Notes (Signed)
   12/19/23 1404  SNF Authorization Status  SNF AUTH USER ROLE CMRN  Date SNF Authorization Complete 12/19/23  SNF Insurance Authorization Status Approved gelene: [id number] exp 12/22/2023)   Peer-to-peer this morning, PT evaluated and requested note sent to [facility].  Bed not available at [facility] due to unforeseen circumstance. Per Marval Suella Stains will have a female bed available tomorrow, 12/20/2023.

## 2023-12-19 NOTE — Plan of Care (Signed)

## 2023-12-19 NOTE — Progress Notes (Signed)
 Patient sat up in chair from 1000-1500. Assisted pt back to bed.
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
## 2023-12-20 DIAGNOSIS — F39 Unspecified mood [affective] disorder: Secondary | ICD-10-CM | POA: Diagnosis not present

## 2023-12-20 DIAGNOSIS — S72052K Unspecified fracture of head of left femur, subsequent encounter for closed fracture with nonunion: Secondary | ICD-10-CM | POA: Diagnosis not present

## 2023-12-20 DIAGNOSIS — E66812 Obesity, class 2: Secondary | ICD-10-CM | POA: Diagnosis not present

## 2023-12-20 MED ORDER — MUPIROCIN 2 % EX OINT: TOPICAL_OINTMENT | Freq: Two times a day (BID) | CUTANEOUS | Status: DC

## 2023-12-20 MED ORDER — POLYETHYLENE GLYCOL 3350 17 G PO PACK: 17.0000 g | PACK | Freq: Two times a day (BID) | ORAL | Status: DC

## 2023-12-20 MED ORDER — ACETAMINOPHEN 500 MG PO TABS: 1000.0000 mg | ORAL_TABLET | Freq: Three times a day (TID) | ORAL | Status: AC

## 2023-12-20 MED ORDER — NICOTINE 14 MG/24HR TD PT24: 14.0000 mg | MEDICATED_PATCH | Freq: Every day | TRANSDERMAL | Status: AC

## 2023-12-20 MED ORDER — OXYCODONE HCL 10 MG PO TABS: 10.0000 mg | ORAL_TABLET | ORAL | 0 refills | Status: AC | PRN

## 2023-12-20 NOTE — Plan of Care (Signed)

## 2023-12-20 NOTE — Care Management Important Message (Signed)
 Important Message  Patient Details  Name: Taylor [name] MRN: [id number] Date of Birth: [birth year]   Important Message Given:  Yes - Medicare IM     Antwaun Buth L Ladene Allocca 12/20/2023, 10:28 AM
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
## 2023-12-20 NOTE — TOC Transition Note (Signed)
 Transition of Care [facility]) - Discharge Note   Patient Details  Name: Taylor [name] MRN: [id number] Date of Birth: 24-May-1990  Transition of Care [facility]) CM/SW Contact:  Sharlyne Stabs, RN Phone Number: 12/20/2023, 10:06 AM   Clinical Narrative:   Shara received, Debbie provided a room number for [facility] Rn calling report. CM calling [facility] to schedule wheelchair transport. Clinicals sent in the hub.     Final next level of care: Skilled Nursing Facility Barriers to Discharge: Insurance Authorization   Patient Goals and CMS Choice Patient states their goals for this hospitalization and ongoing recovery are:: Agreeable to SNF CMS Medicare.gov Compare Post Acute Care list provided to:: Patient Choice offered to / list presented to : Patient [place] ownership interest in [facility].provided to:: Patient    Discharge Placement                Patient to be transferred to facility by: [facility] Name of family member notified: Attempted to call Mother Patient and family notified of of transfer: 12/20/23  Discharge Plan and Services Additional resources added to the After Visit Summary for        Social Drivers of Health (SDOH) Interventions SDOH Screenings   Food Insecurity: Food Insecurity Present (12/15/2023)  Housing: Low Risk  (12/15/2023)  Transportation Needs: No Transportation Needs (12/15/2023)  Recent Concern: Transportation Needs - Unmet Transportation Needs (11/16/2023)  Utilities: At Risk (12/15/2023)  Depression (PHQ2-9): High Risk (11/16/2023)  Financial Resource Strain: Medium Risk (11/11/2022)  Physical Activity: Unknown (11/11/2022)  Social Connections: Socially Isolated (12/15/2023)  Stress: Stress Concern Present (11/11/2022)  Tobacco Use: High Risk (12/14/2023)     Readmission Risk Interventions    12/16/2023   12:18 PM 12/16/2023    9:48 AM 12/15/2023    3:34 PM  Readmission Risk Prevention Plan  Transportation Screening Complete Complete Complete   PCP or Specialist Appt within 3-5 Days   Not Complete  HRI or Home Care Consult Complete Complete Complete  Social Work Consult for Recovery Care Planning/Counseling Complete Complete Complete  Palliative Care Screening Not Applicable Not Applicable Not Applicable  Medication Review Oceanographer) Complete Patient Refused Complete

## 2023-12-25 ENCOUNTER — Other Ambulatory Visit: Payer: Self-pay | Admitting: Family

## 2023-12-25 DIAGNOSIS — F39 Unspecified mood [affective] disorder: Secondary | ICD-10-CM

## 2023-12-25 DIAGNOSIS — F319 Bipolar disorder, unspecified: Secondary | ICD-10-CM

## 2023-12-25 DIAGNOSIS — F321 Major depressive disorder, single episode, moderate: Secondary | ICD-10-CM

## 2023-12-25 DIAGNOSIS — F411 Generalized anxiety disorder: Secondary | ICD-10-CM

## 2023-12-26 DIAGNOSIS — S88111S Complete traumatic amputation at level between knee and ankle, right lower leg, sequela: Secondary | ICD-10-CM | POA: Diagnosis not present

## 2023-12-26 DIAGNOSIS — M546 Pain in thoracic spine: Secondary | ICD-10-CM | POA: Diagnosis not present

## 2023-12-26 DIAGNOSIS — M4135 Thoracogenic scoliosis, thoracolumbar region: Secondary | ICD-10-CM | POA: Diagnosis not present

## 2023-12-26 DIAGNOSIS — M797 Fibromyalgia: Secondary | ICD-10-CM | POA: Diagnosis not present

## 2023-12-27 ENCOUNTER — Encounter: Payer: Self-pay | Admitting: Family

## 2023-12-27 NOTE — Telephone Encounter (Signed)
 Called to schedule appt no answer no voicemail Letter mailed

## 2023-12-27 NOTE — Telephone Encounter (Signed)
 Taylor [name] pt NTBS 30-d given 11/16/23

## 2023-12-28 DIAGNOSIS — S32402D Unspecified fracture of left acetabulum, subsequent encounter for fracture with routine healing: Secondary | ICD-10-CM | POA: Diagnosis not present

## 2023-12-31 ENCOUNTER — Encounter: Payer: Self-pay | Admitting: Radiology

## 2024-01-01 ENCOUNTER — Emergency Department (HOSPITAL_COMMUNITY)
Admission: EM | Admit: 2024-01-01 | Discharge: 2024-01-02 | Disposition: A | Discharge status: Home / Self Care | Attending: Emergency Medicine | Admitting: Emergency Medicine

## 2024-01-01 ENCOUNTER — Emergency Department (HOSPITAL_COMMUNITY)

## 2024-01-01 ENCOUNTER — Other Ambulatory Visit: Payer: Self-pay

## 2024-01-01 ENCOUNTER — Encounter (HOSPITAL_COMMUNITY): Payer: Self-pay

## 2024-01-01 DIAGNOSIS — Z89512 Acquired absence of left leg below knee: Secondary | ICD-10-CM | POA: Diagnosis not present

## 2024-01-01 DIAGNOSIS — J45909 Unspecified asthma, uncomplicated: Secondary | ICD-10-CM | POA: Insufficient documentation

## 2024-01-01 DIAGNOSIS — Z8616 Personal history of COVID-19: Secondary | ICD-10-CM | POA: Diagnosis not present

## 2024-01-01 DIAGNOSIS — M25552 Pain in left hip: Secondary | ICD-10-CM | POA: Diagnosis not present

## 2024-01-01 DIAGNOSIS — F17210 Nicotine dependence, cigarettes, uncomplicated: Secondary | ICD-10-CM | POA: Insufficient documentation

## 2024-01-01 DIAGNOSIS — S72052A Unspecified fracture of head of left femur, initial encounter for closed fracture: Secondary | ICD-10-CM | POA: Diagnosis not present

## 2024-01-01 DIAGNOSIS — I10 Essential (primary) hypertension: Secondary | ICD-10-CM | POA: Insufficient documentation

## 2024-01-01 DIAGNOSIS — S32402A Unspecified fracture of left acetabulum, initial encounter for closed fracture: Secondary | ICD-10-CM | POA: Diagnosis not present

## 2024-01-01 DIAGNOSIS — R509 Fever, unspecified: Secondary | ICD-10-CM | POA: Diagnosis not present

## 2024-01-01 MED ORDER — HYDROMORPHONE HCL 1 MG/ML IJ SOLN
1.0000 mg | Freq: Once | INTRAMUSCULAR | Status: AC
  Administered 2024-01-01: 1 mg via INTRAMUSCULAR
  Filled 2024-01-01: qty 1
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
## 2024-01-01 NOTE — ED Triage Notes (Signed)
 Arrived via [facility] with severe hip pain. Previous hip surgeries about 2 months ago with recurrent dislocations. A week ago, x-ray revealed a dislocated left hip. Pt felt it dislocate again when entering vehicle.

## 2024-01-02 DIAGNOSIS — M25552 Pain in left hip: Secondary | ICD-10-CM | POA: Diagnosis not present

## 2024-01-02 MED ORDER — OXYCODONE HCL 5 MG PO TABS
5.0000 mg | ORAL_TABLET | Freq: Once | ORAL | Status: AC
  Administered 2024-01-02: 5 mg via ORAL
  Filled 2024-01-02: qty 1

## 2024-01-02 NOTE — ED Notes (Signed)
 Call placed to [facility] for transport home via wheelchair van, ETA 

## 2024-01-02 NOTE — Discharge Instructions (Signed)
 You were evaluated in the Emergency Department and after careful evaluation, we did not find any emergent condition requiring admission or further testing in the hospital.  Your exam/testing today was overall reassuring.  X-ray did not show any significant changes.  Continue follow-up with the orthopedic specialist.  Please return to the Emergency Department if you experience any worsening of your condition.  Thank you for allowing us  to be a part of your care.

## 2024-01-18 ENCOUNTER — Encounter (HOSPITAL_COMMUNITY): Payer: Self-pay | Admitting: Emergency Medicine

## 2024-01-18 ENCOUNTER — Other Ambulatory Visit: Payer: Self-pay

## 2024-01-18 ENCOUNTER — Inpatient Hospital Stay (HOSPITAL_COMMUNITY)
Admission: EM | Admit: 2024-01-18 | Discharge: 2024-01-21 | DRG: 602 | Disposition: A | Discharge status: Home / Self Care | Attending: Internal Medicine | Admitting: Internal Medicine

## 2024-01-18 ENCOUNTER — Emergency Department (HOSPITAL_COMMUNITY)

## 2024-01-18 DIAGNOSIS — R609 Edema, unspecified: Secondary | ICD-10-CM | POA: Diagnosis present

## 2024-01-18 DIAGNOSIS — E66813 Obesity, class 3: Secondary | ICD-10-CM | POA: Diagnosis not present

## 2024-01-18 DIAGNOSIS — Z6841 Body Mass Index (BMI) 40.0 and over, adult: Secondary | ICD-10-CM | POA: Diagnosis not present

## 2024-01-18 DIAGNOSIS — J45909 Unspecified asthma, uncomplicated: Secondary | ICD-10-CM | POA: Diagnosis present

## 2024-01-18 DIAGNOSIS — Z975 Presence of (intrauterine) contraceptive device: Secondary | ICD-10-CM

## 2024-01-18 DIAGNOSIS — T45516A Underdosing of anticoagulants, initial encounter: Secondary | ICD-10-CM | POA: Diagnosis present

## 2024-01-18 DIAGNOSIS — Z8616 Personal history of COVID-19: Secondary | ICD-10-CM

## 2024-01-18 DIAGNOSIS — Y92410 Unspecified street and highway as the place of occurrence of the external cause: Secondary | ICD-10-CM | POA: Diagnosis not present

## 2024-01-18 DIAGNOSIS — F319 Bipolar disorder, unspecified: Secondary | ICD-10-CM

## 2024-01-18 DIAGNOSIS — F4001 Agoraphobia with panic disorder: Secondary | ICD-10-CM | POA: Diagnosis present

## 2024-01-18 DIAGNOSIS — M7989 Other specified soft tissue disorders: Secondary | ICD-10-CM | POA: Diagnosis not present

## 2024-01-18 DIAGNOSIS — F321 Major depressive disorder, single episode, moderate: Secondary | ICD-10-CM | POA: Diagnosis not present

## 2024-01-18 DIAGNOSIS — Z8249 Family history of ischemic heart disease and other diseases of the circulatory system: Secondary | ICD-10-CM

## 2024-01-18 DIAGNOSIS — N3000 Acute cystitis without hematuria: Secondary | ICD-10-CM | POA: Diagnosis present

## 2024-01-18 DIAGNOSIS — G629 Polyneuropathy, unspecified: Secondary | ICD-10-CM | POA: Diagnosis present

## 2024-01-18 DIAGNOSIS — F39 Unspecified mood [affective] disorder: Secondary | ICD-10-CM | POA: Diagnosis present

## 2024-01-18 DIAGNOSIS — Z7901 Long term (current) use of anticoagulants: Secondary | ICD-10-CM

## 2024-01-18 DIAGNOSIS — L03116 Cellulitis of left lower limb: Secondary | ICD-10-CM | POA: Diagnosis not present

## 2024-01-18 DIAGNOSIS — M797 Fibromyalgia: Secondary | ICD-10-CM | POA: Diagnosis present

## 2024-01-18 DIAGNOSIS — Z89511 Acquired absence of right leg below knee: Secondary | ICD-10-CM | POA: Diagnosis not present

## 2024-01-18 DIAGNOSIS — Z87442 Personal history of urinary calculi: Secondary | ICD-10-CM

## 2024-01-18 DIAGNOSIS — M79604 Pain in right leg: Secondary | ICD-10-CM | POA: Diagnosis not present

## 2024-01-18 DIAGNOSIS — S72002A Fracture of unspecified part of neck of left femur, initial encounter for closed fracture: Secondary | ICD-10-CM | POA: Diagnosis present

## 2024-01-18 DIAGNOSIS — Z825 Family history of asthma and other chronic lower respiratory diseases: Secondary | ICD-10-CM

## 2024-01-18 DIAGNOSIS — K219 Gastro-esophageal reflux disease without esophagitis: Secondary | ICD-10-CM | POA: Diagnosis not present

## 2024-01-18 DIAGNOSIS — Z87891 Personal history of nicotine dependence: Secondary | ICD-10-CM | POA: Diagnosis not present

## 2024-01-18 DIAGNOSIS — M419 Scoliosis, unspecified: Secondary | ICD-10-CM | POA: Diagnosis present

## 2024-01-18 DIAGNOSIS — Z833 Family history of diabetes mellitus: Secondary | ICD-10-CM

## 2024-01-18 DIAGNOSIS — Z981 Arthrodesis status: Secondary | ICD-10-CM

## 2024-01-18 DIAGNOSIS — F4312 Post-traumatic stress disorder, chronic: Secondary | ICD-10-CM | POA: Diagnosis present

## 2024-01-18 DIAGNOSIS — I10 Essential (primary) hypertension: Secondary | ICD-10-CM | POA: Diagnosis not present

## 2024-01-18 DIAGNOSIS — Z79899 Other long term (current) drug therapy: Secondary | ICD-10-CM

## 2024-01-18 DIAGNOSIS — F411 Generalized anxiety disorder: Secondary | ICD-10-CM

## 2024-01-18 DIAGNOSIS — S72002G Fracture of unspecified part of neck of left femur, subsequent encounter for closed fracture with delayed healing: Secondary | ICD-10-CM

## 2024-01-18 DIAGNOSIS — S88111S Complete traumatic amputation at level between knee and ankle, right lower leg, sequela: Secondary | ICD-10-CM | POA: Diagnosis not present

## 2024-01-18 DIAGNOSIS — Z885 Allergy status to narcotic agent status: Secondary | ICD-10-CM

## 2024-01-18 DIAGNOSIS — S32412A Displaced fracture of anterior wall of left acetabulum, initial encounter for closed fracture: Secondary | ICD-10-CM | POA: Diagnosis present

## 2024-01-18 DIAGNOSIS — M79605 Pain in left leg: Secondary | ICD-10-CM | POA: Diagnosis not present

## 2024-01-18 DIAGNOSIS — E876 Hypokalemia: Secondary | ICD-10-CM | POA: Diagnosis not present

## 2024-01-18 LAB — URINALYSIS, ROUTINE W REFLEX MICROSCOPIC
Bilirubin Urine: NEGATIVE
Glucose, UA: NEGATIVE mg/dL
Hgb urine dipstick: NEGATIVE
Ketones, ur: NEGATIVE mg/dL
Nitrite: POSITIVE — AB
Protein, ur: NEGATIVE mg/dL
Specific Gravity, Urine: 1.008 (ref 1.005–1.030)
pH: 7 (ref 5.0–8.0)

## 2024-01-18 LAB — CBC WITH DIFFERENTIAL/PLATELET
Abs Immature Granulocytes: 0.03 K/uL (ref 0.00–0.07)
Basophils Absolute: 0 K/uL (ref 0.0–0.1)
Basophils Relative: 1 %
Eosinophils Absolute: 0.1 K/uL (ref 0.0–0.5)
Eosinophils Relative: 2 %
HCT: 31 % — ABNORMAL LOW (ref 36.0–46.0)
Hemoglobin: 9.9 g/dL — ABNORMAL LOW (ref 12.0–15.0)
Immature Granulocytes: 0 %
Lymphocytes Relative: 23 %
Lymphs Abs: 1.7 K/uL (ref 0.7–4.0)
MCH: 27.4 pg (ref 26.0–34.0)
MCHC: 31.9 g/dL (ref 30.0–36.0)
MCV: 85.9 fL (ref 80.0–100.0)
Monocytes Absolute: 0.4 K/uL (ref 0.1–1.0)
Monocytes Relative: 6 %
Neutro Abs: 5.1 K/uL (ref 1.7–7.7)
Neutrophils Relative %: 68 %
Platelets: 286 K/uL (ref 150–400)
RBC: 3.61 MIL/uL — ABNORMAL LOW (ref 3.87–5.11)
RDW: 15.2 % (ref 11.5–15.5)
WBC: 7.4 K/uL (ref 4.0–10.5)
nRBC: 0 % (ref 0.0–0.2)

## 2024-01-18 LAB — COMPREHENSIVE METABOLIC PANEL WITH GFR
ALT: 15 U/L (ref 0–44)
AST: 15 U/L (ref 15–41)
Albumin: 3.4 g/dL — ABNORMAL LOW (ref 3.5–5.0)
Alkaline Phosphatase: 93 U/L (ref 38–126)
Anion gap: 10 (ref 5–15)
BUN: 8 mg/dL (ref 6–20)
CO2: 25 mmol/L (ref 22–32)
Calcium: 8.9 mg/dL (ref 8.9–10.3)
Chloride: 104 mmol/L (ref 98–111)
Creatinine, Ser: 0.47 mg/dL (ref 0.44–1.00)
GFR, Estimated: >60 mL/min (ref >60)
Glucose, Bld: 115 mg/dL — ABNORMAL HIGH (ref 70–99)
Potassium: 3.4 mmol/L — ABNORMAL LOW (ref 3.5–5.1)
Sodium: 139 mmol/L (ref 135–145)
Total Bilirubin: 0.3 mg/dL (ref 0.0–1.2)
Total Protein: 7 g/dL (ref 6.5–8.1)

## 2024-01-18 LAB — PROTIME-INR
INR: 0.9 (ref 0.8–1.2)
Prothrombin Time: 12.6 s (ref 11.4–15.2)

## 2024-01-18 LAB — BRAIN NATRIURETIC PEPTIDE: B Natriuretic Peptide: 49 pg/mL (ref 0.0–100.0)

## 2024-01-18 MED ORDER — ACETAMINOPHEN 325 MG PO TABS: 650.0000 mg | ORAL_TABLET | Freq: Four times a day (QID) | ORAL | Status: DC | PRN

## 2024-01-18 MED ORDER — OXYCODONE HCL 5 MG PO TABS
10.0000 mg | ORAL_TABLET | ORAL | Status: DC | PRN
  Administered 2024-01-18 – 2024-01-21 (×15): 10 mg via ORAL
  Filled 2024-01-18 (×15): qty 2

## 2024-01-18 MED ORDER — PREGABALIN 75 MG PO CAPS
150.0000 mg | ORAL_CAPSULE | Freq: Three times a day (TID) | ORAL | Status: DC
  Administered 2024-01-18 – 2024-01-21 (×8): 150 mg via ORAL
  Filled 2024-01-18 (×8): qty 2

## 2024-01-18 MED ORDER — APIXABAN 2.5 MG PO TABS
2.5000 mg | ORAL_TABLET | Freq: Two times a day (BID) | ORAL | Status: DC
  Administered 2024-01-18 – 2024-01-21 (×6): 2.5 mg via ORAL
  Filled 2024-01-18 (×6): qty 1

## 2024-01-18 MED ORDER — DULOXETINE HCL 60 MG PO CPEP: 120.0000 mg | ORAL_CAPSULE | Freq: Every day | ORAL | Status: DC

## 2024-01-18 MED ORDER — ACETAMINOPHEN 650 MG RE SUPP: 650.0000 mg | Freq: Four times a day (QID) | RECTAL | Status: DC | PRN

## 2024-01-18 MED ORDER — IBUPROFEN 600 MG PO TABS: 600.0000 mg | ORAL_TABLET | Freq: Four times a day (QID) | ORAL | Status: DC | PRN

## 2024-01-18 MED ORDER — CARIPRAZINE HCL 1.5 MG PO CAPS
1.5000 mg | ORAL_CAPSULE | Freq: Every day | ORAL | Status: DC
  Administered 2024-01-19 – 2024-01-21 (×3): 1.5 mg via ORAL
  Filled 2024-01-18 (×4): qty 1

## 2024-01-18 MED ORDER — SODIUM CHLORIDE 0.9 % IV SOLN
2.0000 g | INTRAVENOUS | Status: DC
  Administered 2024-01-19 – 2024-01-20 (×2): 2 g via INTRAVENOUS
  Filled 2024-01-18 (×2): qty 20

## 2024-01-18 MED ORDER — POTASSIUM CHLORIDE CRYS ER 20 MEQ PO TBCR
40.0000 meq | EXTENDED_RELEASE_TABLET | Freq: Once | ORAL | Status: AC
  Administered 2024-01-18: 40 meq via ORAL
  Filled 2024-01-18: qty 2

## 2024-01-18 MED ORDER — FENTANYL CITRATE (PF) 100 MCG/2ML IJ SOLN
50.0000 ug | Freq: Once | INTRAMUSCULAR | Status: AC
  Administered 2024-01-18: 50 ug via INTRAVENOUS
  Filled 2024-01-18: qty 2

## 2024-01-18 MED ORDER — SODIUM CHLORIDE 0.9 % IV SOLN
2.0000 g | Freq: Once | INTRAVENOUS | Status: AC
  Administered 2024-01-18: 2 g via INTRAVENOUS
  Filled 2024-01-18: qty 20

## 2024-01-18 MED ORDER — ONDANSETRON HCL 4 MG/2ML IJ SOLN: 4.0000 mg | Freq: Four times a day (QID) | INTRAMUSCULAR | Status: DC | PRN

## 2024-01-18 MED ORDER — ONDANSETRON HCL 4 MG PO TABS: 4.0000 mg | ORAL_TABLET | Freq: Four times a day (QID) | ORAL | Status: DC | PRN

## 2024-01-18 MED ORDER — NICOTINE 14 MG/24HR TD PT24
14.0000 mg | MEDICATED_PATCH | Freq: Every day | TRANSDERMAL | Status: DC
  Administered 2024-01-18 – 2024-01-20 (×3): 14 mg via TRANSDERMAL
  Filled 2024-01-18 (×3): qty 1

## 2024-01-18 MED ORDER — METHOCARBAMOL 500 MG PO TABS
1000.0000 mg | ORAL_TABLET | Freq: Four times a day (QID) | ORAL | Status: DC | PRN
  Administered 2024-01-19 – 2024-01-20 (×4): 1000 mg via ORAL
  Filled 2024-01-18 (×4): qty 2

## 2024-01-18 MED ORDER — POLYETHYLENE GLYCOL 3350 17 G PO PACK: 17.0000 g | PACK | Freq: Every day | ORAL | Status: DC | PRN

## 2024-01-18 MED ORDER — PANTOPRAZOLE SODIUM 40 MG PO TBEC: 40.0000 mg | DELAYED_RELEASE_TABLET | Freq: Two times a day (BID) | ORAL | Status: DC

## 2024-01-18 MED ORDER — OXYCODONE HCL 10 MG PO TABS: 10.0000 mg | ORAL_TABLET | Freq: Four times a day (QID) | ORAL | Status: DC | PRN

## 2024-01-18 NOTE — Assessment & Plan Note (Signed)
Potassium 3.4

## 2024-01-18 NOTE — Assessment & Plan Note (Signed)
 Erythema, swelling and pain to left lower extremity.  No open wounds/ulcers.  Rules out for sepsis.  Comminuted fracture to same left hip- awaiting hip arthroplasty, pending healing of the acetabulum.  Bilateral lower extremity venous Dopplers negative for DVT.  Has not been compliant with Eliquis . -IV ceftriaxone  2 g daily
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
## 2024-01-18 NOTE — ED Triage Notes (Signed)
 Pt presents with significant lower leg swelling and hip pain

## 2024-01-18 NOTE — Assessment & Plan Note (Signed)
 Resume cariprazine , Cymbalta .

## 2024-01-18 NOTE — ED Notes (Signed)
 ED Provider at bedside.

## 2024-01-18 NOTE — ED Notes (Addendum)
 Regular diet per Dr. Gerardine pt with [place] sandwich
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
## 2024-01-19 DIAGNOSIS — L03116 Cellulitis of left lower limb: Secondary | ICD-10-CM | POA: Diagnosis not present

## 2024-01-19 DIAGNOSIS — E876 Hypokalemia: Secondary | ICD-10-CM | POA: Diagnosis not present

## 2024-01-19 DIAGNOSIS — F39 Unspecified mood [affective] disorder: Secondary | ICD-10-CM | POA: Diagnosis not present

## 2024-01-19 DIAGNOSIS — S72002G Fracture of unspecified part of neck of left femur, subsequent encounter for closed fracture with delayed healing: Secondary | ICD-10-CM | POA: Diagnosis not present

## 2024-01-19 DIAGNOSIS — E66813 Obesity, class 3: Secondary | ICD-10-CM

## 2024-01-19 LAB — CBC
HCT: 32.4 % — ABNORMAL LOW (ref 36.0–46.0)
Hemoglobin: 10.1 g/dL — ABNORMAL LOW (ref 12.0–15.0)
MCH: 26.9 pg (ref 26.0–34.0)
MCHC: 31.2 g/dL (ref 30.0–36.0)
MCV: 86.4 fL (ref 80.0–100.0)
Platelets: 265 K/uL (ref 150–400)
RBC: 3.75 MIL/uL — ABNORMAL LOW (ref 3.87–5.11)
RDW: 15.1 % (ref 11.5–15.5)
WBC: 6.8 K/uL (ref 4.0–10.5)
nRBC: 0 % (ref 0.0–0.2)

## 2024-01-19 LAB — BASIC METABOLIC PANEL WITH GFR
Anion gap: 7 (ref 5–15)
BUN: 7 mg/dL (ref 6–20)
CO2: 27 mmol/L (ref 22–32)
Calcium: 9 mg/dL (ref 8.9–10.3)
Chloride: 105 mmol/L (ref 98–111)
Creatinine, Ser: 0.42 mg/dL — ABNORMAL LOW (ref 0.44–1.00)
GFR, Estimated: >60 mL/min (ref >60)
Glucose, Bld: 99 mg/dL (ref 70–99)
Potassium: 3.6 mmol/L (ref 3.5–5.1)
Sodium: 139 mmol/L (ref 135–145)

## 2024-01-19 NOTE — Plan of Care (Signed)
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
## 2024-01-20 DIAGNOSIS — L03116 Cellulitis of left lower limb: Secondary | ICD-10-CM | POA: Diagnosis not present

## 2024-01-20 DIAGNOSIS — F39 Unspecified mood [affective] disorder: Secondary | ICD-10-CM | POA: Diagnosis not present

## 2024-01-20 DIAGNOSIS — S72002G Fracture of unspecified part of neck of left femur, subsequent encounter for closed fracture with delayed healing: Secondary | ICD-10-CM | POA: Diagnosis not present

## 2024-01-20 DIAGNOSIS — E876 Hypokalemia: Secondary | ICD-10-CM | POA: Diagnosis not present

## 2024-01-20 LAB — MRSA NEXT GEN BY PCR, NASAL: MRSA by PCR Next Gen: DETECTED — AB

## 2024-01-20 MED ORDER — MUPIROCIN 2 % EX OINT
1.0000 | TOPICAL_OINTMENT | Freq: Two times a day (BID) | CUTANEOUS | Status: DC
  Administered 2024-01-21 (×2): 1 via NASAL
  Filled 2024-01-20: qty 22

## 2024-01-20 NOTE — Plan of Care (Signed)
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
## 2024-01-21 DIAGNOSIS — N3000 Acute cystitis without hematuria: Secondary | ICD-10-CM | POA: Diagnosis not present

## 2024-01-21 DIAGNOSIS — K219 Gastro-esophageal reflux disease without esophagitis: Secondary | ICD-10-CM

## 2024-01-21 DIAGNOSIS — F39 Unspecified mood [affective] disorder: Secondary | ICD-10-CM | POA: Diagnosis not present

## 2024-01-21 DIAGNOSIS — L03116 Cellulitis of left lower limb: Secondary | ICD-10-CM | POA: Diagnosis not present

## 2024-01-21 LAB — URINE CULTURE: Culture: >=100000 — AB

## 2024-01-21 MED ORDER — CEPHALEXIN 500 MG PO CAPS: 500.0000 mg | ORAL_CAPSULE | Freq: Three times a day (TID) | ORAL | 0 refills | Status: AC

## 2024-01-21 MED ORDER — DOXYCYCLINE HYCLATE 100 MG PO TABS: 100.0000 mg | ORAL_TABLET | Freq: Two times a day (BID) | ORAL | 0 refills | Status: AC

## 2024-01-21 MED ORDER — APIXABAN 2.5 MG PO TABS: 2.5000 mg | ORAL_TABLET | Freq: Two times a day (BID) | ORAL | 2 refills | Status: AC

## 2024-01-21 MED ORDER — DULOXETINE HCL 60 MG PO CPEP: 120.0000 mg | ORAL_CAPSULE | Freq: Every day | ORAL | 0 refills | Status: AC

## 2024-01-21 MED ORDER — SACCHAROMYCES BOULARDII 250 MG PO CAPS: 250.0000 mg | ORAL_CAPSULE | Freq: Two times a day (BID) | ORAL | 0 refills | Status: AC

## 2024-01-21 MED ORDER — PANTOPRAZOLE SODIUM 40 MG PO TBEC
40.0000 mg | DELAYED_RELEASE_TABLET | Freq: Two times a day (BID) | ORAL | 1 refills | Status: AC
Start: 2024-01-21 — End: ?

## 2024-01-21 MED ORDER — IBUPROFEN 800 MG PO TABS: 800.0000 mg | ORAL_TABLET | Freq: Three times a day (TID) | ORAL | 0 refills | Status: AC | PRN
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
## 2024-01-21 NOTE — Progress Notes (Signed)
 Went over discharge instructions w/ pt.

## 2024-01-21 NOTE — Plan of Care (Signed)
  Problem: Education: Goal: Knowledge of General Education information will improve Description: Including pain rating scale, medication(s)/side effects and non-pharmacologic comfort measures Outcome: Progressing   Problem: Clinical Measurements: Goal: Ability to maintain clinical measurements within normal limits will improve Outcome: Progressing Goal: Will remain free from infection Outcome: Progressing Goal: Diagnostic test results will improve Outcome: Progressing Goal: Respiratory complications will improve Outcome: Progressing Goal: Cardiovascular complication will be avoided Outcome: Progressing   Problem: Coping: Goal: Level of anxiety will decrease Outcome: Progressing   Problem: Elimination: Goal: Will not experience complications related to bowel motility Outcome: Progressing Goal: Will not experience complications related to urinary retention Outcome: Progressing   Problem: Pain Managment: Goal: General experience of comfort will improve and/or be controlled Outcome: Progressing   Problem: Safety: Goal: Ability to remain free from injury will improve Outcome: Progressing   Problem: Skin Integrity: Goal: Risk for impaired skin integrity will decrease Outcome: Progressing

## 2024-01-21 NOTE — Plan of Care (Signed)

## 2024-01-24 ENCOUNTER — Telehealth: Payer: Self-pay | Admitting: *Deleted

## 2024-01-24 NOTE — Transitions of Care (Post Inpatient/ED Visit) (Signed)
   01/24/2024  Name: Taylor [name] MRN: [id number] DOB: 1991-03-27  Today's TOC FU Call Status: Today's TOC FU Call Status:: Unsuccessful Call (1st Attempt) Unsuccessful Call (1st Attempt) Date: 01/24/24  Attempted to reach the patient regarding the most recent Inpatient/ED visit.  Follow Up Plan: Additional outreach attempts will be made to reach the patient to complete the Transitions of Care (Post Inpatient/ED visit) call.   Mliss Creed [facility], BSN RN Care Manager/ Transition of Care [place] [phone]

## 2024-01-25 ENCOUNTER — Telehealth: Payer: Self-pay | Admitting: *Deleted

## 2024-01-25 NOTE — Transitions of Care (Post Inpatient/ED Visit) (Signed)
   01/25/2024  Name: LYNIAH FUJITA MRN: [id number] DOB: Sep 17, 1990  Today's TOC FU Call Status: Today's TOC FU Call Status:: Unsuccessful Call (2nd Attempt) Unsuccessful Call (2nd Attempt) Date: 01/25/24  Attempted to reach the patient regarding the most recent Inpatient/ED visit.  Follow Up Plan: Additional outreach attempts will be made to reach the patient to complete the Transitions of Care (Post Inpatient/ED visit) call.   Andrea Dimes RN, BSN   [facility] RN Care Manager [phone]

## 2024-01-26 ENCOUNTER — Telehealth: Payer: Self-pay

## 2024-01-26 DIAGNOSIS — M4135 Thoracogenic scoliosis, thoracolumbar region: Secondary | ICD-10-CM | POA: Diagnosis not present

## 2024-01-26 DIAGNOSIS — M546 Pain in thoracic spine: Secondary | ICD-10-CM | POA: Diagnosis not present

## 2024-01-26 DIAGNOSIS — S88111S Complete traumatic amputation at level between knee and ankle, right lower leg, sequela: Secondary | ICD-10-CM | POA: Diagnosis not present

## 2024-01-26 DIAGNOSIS — M797 Fibromyalgia: Secondary | ICD-10-CM | POA: Diagnosis not present

## 2024-01-26 NOTE — Transitions of Care (Post Inpatient/ED Visit) (Signed)
   01/26/2024  Name: Taylor [name] MRN: [id number] DOB: 20-Feb-1991  Today's TOC FU Call Status: Today's TOC FU Call Status:: Unsuccessful Call (3rd Attempt) Unsuccessful Call (3rd Attempt) Date: 01/26/24  Attempted to reach the patient regarding the most recent Inpatient/ED visit. Was unable to leave a voicemail message.  Follow Up Plan: No further outreach attempts will be made at this time. We have been unable to contact the patient.  Richerd Fish, RN, BSN, CCM [facility] RN Care Manager Direct Dial : [phone]

## 2024-02-25 DIAGNOSIS — M4135 Thoracogenic scoliosis, thoracolumbar region: Secondary | ICD-10-CM | POA: Diagnosis not present

## 2024-02-25 DIAGNOSIS — M546 Pain in thoracic spine: Secondary | ICD-10-CM | POA: Diagnosis not present

## 2024-02-25 DIAGNOSIS — S88111S Complete traumatic amputation at level between knee and ankle, right lower leg, sequela: Secondary | ICD-10-CM | POA: Diagnosis not present

## 2024-02-25 DIAGNOSIS — M797 Fibromyalgia: Secondary | ICD-10-CM | POA: Diagnosis not present

## 2024-02-28 DIAGNOSIS — S32402D Unspecified fracture of left acetabulum, subsequent encounter for fracture with routine healing: Secondary | ICD-10-CM | POA: Diagnosis not present

## 2024-03-09 ENCOUNTER — Other Ambulatory Visit: Payer: Self-pay | Admitting: Family

## 2024-03-09 DIAGNOSIS — G4700 Insomnia, unspecified: Secondary | ICD-10-CM

## 2024-03-09 NOTE — Telephone Encounter (Signed)
 Appt for physical 03-14-2024

## 2024-03-09 NOTE — Telephone Encounter (Signed)
 Christy NTBS last OV for chronic fu was 03/04/23 NO RF sent to pharmacy last OV greater than a year

## 2024-03-10 MED ORDER — TRAZODONE HCL 100 MG PO TABS
100.0000 mg | ORAL_TABLET | Freq: Every day | ORAL | 0 refills | Status: AC
Start: 2024-03-10 — End: ?

## 2024-03-10 NOTE — Addendum Note (Signed)
 Addended by: Destaney Sarkis D on: 03/10/2024 08:43 AM   Modules accepted: Orders

## 2024-03-13 ENCOUNTER — Encounter: Payer: Self-pay | Admitting: Radiology

## 2024-03-13 ENCOUNTER — Other Ambulatory Visit (HOSPITAL_COMMUNITY): Admission: RE | Admit: 2024-03-13 | Source: Ambulatory Visit

## 2024-03-14 ENCOUNTER — Encounter: Payer: Self-pay | Admitting: Family

## 2024-03-14 ENCOUNTER — Encounter: Admitting: Family

## 2024-03-19 DIAGNOSIS — M25552 Pain in left hip: Secondary | ICD-10-CM | POA: Diagnosis not present

## 2024-03-29 ENCOUNTER — Other Ambulatory Visit: Payer: Self-pay | Admitting: Family

## 2024-03-29 DIAGNOSIS — F411 Generalized anxiety disorder: Secondary | ICD-10-CM

## 2024-03-29 DIAGNOSIS — F319 Bipolar disorder, unspecified: Secondary | ICD-10-CM

## 2024-03-29 DIAGNOSIS — F321 Major depressive disorder, single episode, moderate: Secondary | ICD-10-CM

## 2024-03-29 DIAGNOSIS — F39 Unspecified mood [affective] disorder: Secondary | ICD-10-CM

## 2024-03-30 DIAGNOSIS — W07XXXA Fall from chair, initial encounter: Secondary | ICD-10-CM | POA: Diagnosis not present

## 2024-03-30 DIAGNOSIS — Z743 Need for continuous supervision: Secondary | ICD-10-CM | POA: Diagnosis not present

## 2024-03-30 DIAGNOSIS — M25551 Pain in right hip: Secondary | ICD-10-CM | POA: Diagnosis not present

## 2024-03-30 DIAGNOSIS — S7002XA Contusion of left hip, initial encounter: Secondary | ICD-10-CM | POA: Diagnosis not present

## 2024-03-30 DIAGNOSIS — S7000XA Contusion of unspecified hip, initial encounter: Secondary | ICD-10-CM | POA: Diagnosis not present

## 2024-03-30 DIAGNOSIS — M25552 Pain in left hip: Secondary | ICD-10-CM | POA: Diagnosis not present

## 2024-03-30 DIAGNOSIS — Z885 Allergy status to narcotic agent status: Secondary | ICD-10-CM | POA: Diagnosis not present

## 2024-03-30 NOTE — Telephone Encounter (Signed)
 Christy NTBS last OV for Dx 03/04/23 NO RF sent to pharmacy last OV greater than a year

## 2024-03-30 NOTE — Telephone Encounter (Signed)
Left message to schedule appt

## 2024-05-11 DEATH — deceased

## 2024-06-07 ENCOUNTER — Other Ambulatory Visit: Payer: Self-pay | Admitting: Family

## 2024-06-07 DIAGNOSIS — G4700 Insomnia, unspecified: Secondary | ICD-10-CM

## 2024-06-07 NOTE — Telephone Encounter (Signed)
 Christy NTBS last OV 07/01/23 NO RF sent to pharmacy last OV greater than a year

## 2024-06-08 ENCOUNTER — Encounter: Payer: Self-pay | Admitting: Family

## 2024-06-08 NOTE — Telephone Encounter (Signed)
 Called to schedule appt no answer no voicemail Letter mailed
